# Patient Record
Sex: Male | Born: 1960 | Race: White | Hispanic: No | Marital: Married | Smoking: Never smoker
Health system: Southern US, Community
[De-identification: ages and names within clinical notes are randomized; demographics above are authoritative.]

## PROBLEM LIST (undated history)

## (undated) DIAGNOSIS — L570 Actinic keratosis: Secondary | ICD-10-CM

## (undated) DIAGNOSIS — I2510 Atherosclerotic heart disease of native coronary artery without angina pectoris: Secondary | ICD-10-CM

## (undated) DIAGNOSIS — Z9889 Other specified postprocedural states: Secondary | ICD-10-CM

## (undated) DIAGNOSIS — E785 Hyperlipidemia, unspecified: Secondary | ICD-10-CM

## (undated) DIAGNOSIS — I10 Essential (primary) hypertension: Secondary | ICD-10-CM

## (undated) HISTORY — PX: APPENDECTOMY: SHX54

## (undated) HISTORY — PX: CARDIAC CATHETERIZATION: SHX172

## (undated) HISTORY — DX: Other specified postprocedural states: Z98.890

## (undated) HISTORY — DX: Essential (primary) hypertension: I10

## (undated) HISTORY — PX: HERNIA REPAIR: SHX51

## (undated) HISTORY — PX: FINGER SURGERY: SHX640

## (undated) HISTORY — DX: Hyperlipidemia, unspecified: E78.5

## (undated) HISTORY — PX: CORONARY ARTERY BYPASS GRAFT: SHX141

## (undated) HISTORY — DX: Actinic keratosis: L57.0

---

## 2004-10-24 ENCOUNTER — Ambulatory Visit: Payer: Self-pay | Admitting: Family Medicine

## 2005-08-08 ENCOUNTER — Ambulatory Visit: Payer: Self-pay | Admitting: Family Medicine

## 2010-02-18 ENCOUNTER — Ambulatory Visit: Payer: Self-pay | Admitting: Family Medicine

## 2010-10-25 LAB — HM COLONOSCOPY

## 2012-01-29 LAB — HM HEPATITIS C SCREENING LAB: HM Hepatitis Screen: NEGATIVE

## 2013-12-13 ENCOUNTER — Ambulatory Visit: Payer: Self-pay | Admitting: Family Medicine

## 2014-03-03 ENCOUNTER — Ambulatory Visit: Payer: Self-pay | Admitting: Family Medicine

## 2014-04-04 LAB — CBC AND DIFFERENTIAL
HEMATOCRIT: 41 % (ref 41–53)
HEMOGLOBIN: 14.7 g/dL (ref 13.5–17.5)
Neutrophils Absolute: 4 /uL
Platelets: 189 10*3/uL (ref 150–399)
WBC: 5.9 10^3/mL

## 2014-04-04 LAB — BASIC METABOLIC PANEL
BUN: 12 mg/dL (ref 4–21)
Creatinine: 1 mg/dL (ref 0.6–1.3)
GLUCOSE: 119 mg/dL
POTASSIUM: 4.4 mmol/L (ref 3.4–5.3)
Sodium: 139 mmol/L (ref 137–147)

## 2014-04-04 LAB — LIPID PANEL
CHOLESTEROL: 194 mg/dL (ref 0–200)
HDL: 42 mg/dL (ref 35–70)
LDL CALC: 118 mg/dL
LDl/HDL Ratio: 2.8
Triglycerides: 170 mg/dL — AB (ref 40–160)

## 2014-04-04 LAB — HEPATIC FUNCTION PANEL
ALT: 42 U/L — AB (ref 10–40)
AST: 30 U/L (ref 14–40)
Alkaline Phosphatase: 58 U/L (ref 25–125)

## 2014-04-04 LAB — PSA: PSA: 0.5

## 2014-04-04 LAB — TSH: TSH: 1.55 u[IU]/mL (ref 0.41–5.90)

## 2014-09-06 DIAGNOSIS — D239 Other benign neoplasm of skin, unspecified: Secondary | ICD-10-CM

## 2014-09-06 HISTORY — DX: Other benign neoplasm of skin, unspecified: D23.9

## 2014-10-03 LAB — HEMOGLOBIN A1C: HEMOGLOBIN A1C: 5.2 % (ref 4.0–6.0)

## 2015-03-23 ENCOUNTER — Other Ambulatory Visit: Payer: Self-pay | Admitting: Family Medicine

## 2015-03-23 NOTE — Telephone Encounter (Signed)
Shawn Crawford stated that her insurance notified her that they would no longer cover Zolpidem Tartrate ER 12.5 mg and that Lunesta would be covered. Shawn Crawford would like a RX for Lunesta sent to [facility] [address] Abbott Crawford stated she was on Lunesta before and in [facility] the last time Shawn Crawford was written was for 3 mg on 11/04/12. I advised Shawn Crawford that Shawn Crawford is out of the office until 04/03/15 & I would ask another provider if the new RX could be sent in. Shawn Crawford stated that was fine but since she has enough until mid July she can wait until Shawn Crawford returns to the office. Thanks TNP

## 2015-04-04 MED ORDER — ESZOPICLONE 3 MG PO TABS: 3.0000 mg | ORAL_TABLET | Freq: Every day | ORAL | Status: DC

## 2015-04-04 NOTE — Telephone Encounter (Signed)
Okay to print prescription for Lunesta 3 mg daily at bedtime when necessary sleep #30,5rf refills. Thanks

## 2015-04-04 NOTE — Telephone Encounter (Signed)
Patient advised, RX ready for pick up=aa

## 2015-05-07 DIAGNOSIS — I10 Essential (primary) hypertension: Secondary | ICD-10-CM | POA: Insufficient documentation

## 2015-05-10 DIAGNOSIS — E559 Vitamin D deficiency, unspecified: Secondary | ICD-10-CM | POA: Insufficient documentation

## 2015-05-10 DIAGNOSIS — I10 Essential (primary) hypertension: Secondary | ICD-10-CM | POA: Insufficient documentation

## 2015-05-10 DIAGNOSIS — E669 Obesity, unspecified: Secondary | ICD-10-CM | POA: Insufficient documentation

## 2015-05-10 DIAGNOSIS — G8929 Other chronic pain: Secondary | ICD-10-CM | POA: Insufficient documentation

## 2015-05-10 DIAGNOSIS — I2510 Atherosclerotic heart disease of native coronary artery without angina pectoris: Secondary | ICD-10-CM | POA: Insufficient documentation

## 2015-05-10 DIAGNOSIS — N529 Male erectile dysfunction, unspecified: Secondary | ICD-10-CM | POA: Insufficient documentation

## 2015-05-10 DIAGNOSIS — M5416 Radiculopathy, lumbar region: Secondary | ICD-10-CM | POA: Insufficient documentation

## 2015-05-10 DIAGNOSIS — R7309 Other abnormal glucose: Secondary | ICD-10-CM | POA: Insufficient documentation

## 2015-05-10 DIAGNOSIS — K219 Gastro-esophageal reflux disease without esophagitis: Secondary | ICD-10-CM | POA: Insufficient documentation

## 2015-05-10 DIAGNOSIS — E785 Hyperlipidemia, unspecified: Secondary | ICD-10-CM | POA: Insufficient documentation

## 2015-05-30 ENCOUNTER — Encounter: Payer: Self-pay | Admitting: Family Medicine

## 2015-05-30 ENCOUNTER — Ambulatory Visit (INDEPENDENT_AMBULATORY_CARE_PROVIDER_SITE_OTHER): Payer: 59 | Admitting: Family Medicine

## 2015-05-30 VITALS — BP 118/84 | HR 84 | Temp 98.8°F | Resp 16 | Ht 71.0 in | Wt 183.0 lb

## 2015-05-30 DIAGNOSIS — Z1211 Encounter for screening for malignant neoplasm of colon: Secondary | ICD-10-CM | POA: Diagnosis not present

## 2015-05-30 DIAGNOSIS — Z0000 Encounter for general adult medical examination without abnormal findings: Secondary | ICD-10-CM

## 2015-05-30 DIAGNOSIS — Z125 Encounter for screening for malignant neoplasm of prostate: Secondary | ICD-10-CM | POA: Diagnosis not present

## 2015-05-30 DIAGNOSIS — E119 Type 2 diabetes mellitus without complications: Secondary | ICD-10-CM | POA: Diagnosis not present

## 2015-05-30 DIAGNOSIS — Z23 Encounter for immunization: Secondary | ICD-10-CM

## 2015-05-30 DIAGNOSIS — R739 Hyperglycemia, unspecified: Secondary | ICD-10-CM | POA: Insufficient documentation

## 2015-05-30 LAB — POCT URINALYSIS DIPSTICK
Bilirubin, UA: NEGATIVE
GLUCOSE UA: NEGATIVE
Ketones, UA: NEGATIVE
Leukocytes, UA: NEGATIVE
NITRITE UA: NEGATIVE
PH UA: 6
Protein, UA: NEGATIVE
RBC UA: NEGATIVE
SPEC GRAV UA: 1.025
UROBILINOGEN UA: 0.2

## 2015-05-30 LAB — IFOBT (OCCULT BLOOD): IMMUNOLOGICAL FECAL OCCULT BLOOD TEST: NEGATIVE

## 2015-05-30 NOTE — Progress Notes (Signed)
Patient: Shawn Crawford, Male    DOB: 1961-01-02, 54 y.o.   MRN: [id number] Visit Date: 05/30/2015  Today's Provider: [name], MD   Chief Complaint  Patient presents with  . Annual Exam   Subjective:    Annual physical exam Shawn Crawford is a 54 y.o. male who presents today for health maintenance and complete physical. He feels well. He reports exercising yes/walking 2 miles aily. He reports he is sleeping fairly well/6hrs a night.  -----------------------------------------------------------------   Review of Systems  Constitutional: Negative.   HENT: Negative.   Eyes: Negative.   Respiratory: Negative.   Cardiovascular: Negative.   Gastrointestinal: Negative.   Endocrine: Negative.   Genitourinary: Negative.   Musculoskeletal: Negative.   Skin: Negative.   Allergic/Immunologic: Negative.   Neurological: Positive for dizziness. Negative for light-headedness and headaches.  Hematological: Negative.   Psychiatric/Behavioral: Negative.     Social History He  reports that he has never smoked. He does not have any smokeless tobacco history on file. He reports that he drinks alcohol. He reports that he does not use illicit drugs. Social History   Social History  . Marital Status: Single    Spouse Name: N/A  . Number of Children: N/A  . Years of Education: N/A   Social History Main Topics  . Smoking status: Never Smoker   . Smokeless tobacco: None  . Alcohol Use: 0.0 oz/week    0 Standard drinks or equivalent per week  . Drug Use: No  . Sexual Activity: Not Asked   Other Topics Concern  . None   Social History Narrative    Patient Active Problem List   Diagnosis Date Noted  . Abnormal blood sugar 05/10/2015  . Arteriosclerosis of coronary artery 05/10/2015  . ED (erectile dysfunction) of organic origin 05/10/2015  . Acid reflux 05/10/2015  . HLD (hyperlipidemia) 05/10/2015  . BP (high blood pressure) 05/10/2015  . Lumbar radiculopathy  05/10/2015  . Adiposity 05/10/2015  . Avitaminosis D 05/10/2015    Past Surgical History  Procedure Laterality Date  . Coronary artery bypass graft      x 3  . Hernia repair    . Appendectomy      Family History  Family Status  Relation Status Death Age  . Mother Alive   . Father Deceased   . Sister Alive   . Son Alive   . Sister Alive   . Son Alive    His family history includes Colon cancer in his sister; Diabetes in his father; Hyperlipidemia in his mother; Hypertension in his father; Thyroid cancer in his sister.    Allergies  Allergen Reactions  . Penicillins     Previous Medications   ASPIRIN 325 MG TABLET    Take by mouth.   COENZYME Q-10 PO    Take by mouth.   CYCLOBENZAPRINE (FLEXERIL) 10 MG TABLET    Take by mouth.   ESZOPICLONE (ESZOPICLONE) 3 MG TABS    Take 1 tablet (3 mg total) by mouth at bedtime. Take immediately before bedtime   LORATADINE (CLARITIN) 10 MG CAPS    Take by mouth.   METOPROLOL TARTRATE (LOPRESSOR) 25 MG TABLET    Take by mouth.   MULTIPLE VITAMINS PO    Take by mouth.   OMEGA-3 FATTY ACIDS (FISH OIL) 1200 MG CAPS    Take by mouth.   OMEPRAZOLE (PRILOSEC) 20 MG CAPSULE    Take by mouth.   ROSUVASTATIN (CRESTOR)  20 MG TABLET    Take by mouth.   ZOLPIDEM (AMBIEN CR) 12.5 MG CR TABLET    Take by mouth.    Patient Care Team: Jerrol Banana., MD as PCP - General (Family Medicine)     Objective:   Vitals: BP 118/84 mmHg  Pulse 84  Temp(Src) 98.8 F (37.1 C) (Oral)  Resp 16  Ht 5\' 11"  (1.803 m)  Wt 183 lb (83.008 kg)  BMI 25.53 kg/m2  SpO2 98%   Physical Exam  Constitutional: He is oriented to person, place, and time. He appears well-developed and well-nourished.  HENT:  Head: Normocephalic and atraumatic.  Right Ear: External ear normal.  Nose: Nose normal.  Mouth/Throat: Oropharynx is clear and moist.  Eyes: Conjunctivae are normal. Pupils are equal, round, and reactive to light.  Neck: Normal range of motion. Neck  supple.  Cardiovascular: Normal rate, regular rhythm, normal heart sounds and intact distal pulses.   Pulmonary/Chest: Effort normal and breath sounds normal.  Abdominal: Soft. Bowel sounds are normal.  Genitourinary: Rectum normal, prostate normal and penis normal.  Musculoskeletal: Normal range of motion.  Neurological: He is alert and oriented to person, place, and time.  Skin: Skin is warm and dry.  Male pattern baldness. Scar on epigastrium where Dr. Nehemiah Massed removed  dysplastic nevus Feb  2016  Psychiatric: He has a normal mood and affect. His behavior is normal. Judgment and thought content normal.     Depression Screen PHQ 2/9 Scores 05/30/2015  PHQ - 2 Score 0  PHQ- 9 Score 0      Assessment & Plan:     Routine Health Maintenance and Physical Exam  Exercise Activities and Dietary recommendations Goals    None      Immunization History  Administered Date(s) Administered  . Tdap 12/04/2008    Health Maintenance  Topic Date Due  . Hepatitis C Screening  11-Jan-1961  . HIV Screening  05/18/1975  . INFLUENZA VACCINE  04/30/2015  . TETANUS/TDAP  12/05/2018  . COLONOSCOPY  10/25/2020      Discussed health benefits of physical activity, and encouraged him to engage in regular exercise appropriate for his age and condition.    --------------------------------------------------------------------

## 2015-06-12 LAB — LIPID PANEL WITH LDL/HDL RATIO
Cholesterol, Total: 158 mg/dL (ref 100–199)
HDL: 46 mg/dL (ref >39)
LDL CALC: 90 mg/dL (ref 0–99)
LDL/HDL RATIO: 2 ratio (ref 0.0–3.6)
TRIGLYCERIDES: 110 mg/dL (ref 0–149)
VLDL CHOLESTEROL CAL: 22 mg/dL (ref 5–40)

## 2015-06-12 LAB — COMPREHENSIVE METABOLIC PANEL
ALK PHOS: 54 IU/L (ref 39–117)
ALT: 40 IU/L (ref 0–44)
AST: 27 IU/L (ref 0–40)
Albumin/Globulin Ratio: 1.9 (ref 1.1–2.5)
Albumin: 4.4 g/dL (ref 3.5–5.5)
BILIRUBIN TOTAL: 0.3 mg/dL (ref 0.0–1.2)
BUN / CREAT RATIO: 16 (ref 9–20)
BUN: 19 mg/dL (ref 6–24)
CHLORIDE: 103 mmol/L (ref 97–108)
CO2: 22 mmol/L (ref 18–29)
Calcium: 9.7 mg/dL (ref 8.7–10.2)
Creatinine, Ser: 1.16 mg/dL (ref 0.76–1.27)
GFR calc Af Amer: 81 mL/min/{1.73_m2} (ref >59)
GFR calc non Af Amer: 70 mL/min/{1.73_m2} (ref >59)
GLUCOSE: 131 mg/dL — AB (ref 65–99)
Globulin, Total: 2.3 g/dL (ref 1.5–4.5)
POTASSIUM: 4.6 mmol/L (ref 3.5–5.2)
Sodium: 143 mmol/L (ref 134–144)
Total Protein: 6.7 g/dL (ref 6.0–8.5)

## 2015-06-12 LAB — CBC WITH DIFFERENTIAL/PLATELET
BASOS ABS: 0 10*3/uL (ref 0.0–0.2)
Basos: 0 %
EOS (ABSOLUTE): 0.2 10*3/uL (ref 0.0–0.4)
Eos: 3 %
Hematocrit: 45.2 % (ref 37.5–51.0)
Hemoglobin: 15.8 g/dL (ref 12.6–17.7)
Immature Grans (Abs): 0 10*3/uL (ref 0.0–0.1)
Immature Granulocytes: 0 %
LYMPHS ABS: 1.1 10*3/uL (ref 0.7–3.1)
LYMPHS: 20 %
MCH: 31.3 pg (ref 26.6–33.0)
MCHC: 35 g/dL (ref 31.5–35.7)
MCV: 90 fL (ref 79–97)
MONOS ABS: 0.5 10*3/uL (ref 0.1–0.9)
Monocytes: 9 %
NEUTROS ABS: 3.7 10*3/uL (ref 1.4–7.0)
Neutrophils: 68 %
PLATELETS: 170 10*3/uL (ref 150–379)
RBC: 5.05 x10E6/uL (ref 4.14–5.80)
RDW: 12.4 % (ref 12.3–15.4)
WBC: 5.5 10*3/uL (ref 3.4–10.8)

## 2015-06-12 LAB — HEMOGLOBIN A1C
Est. average glucose Bld gHb Est-mCnc: 123 mg/dL
Hgb A1c MFr Bld: 5.9 % — ABNORMAL HIGH (ref 4.8–5.6)

## 2015-06-12 LAB — TSH: TSH: 0.952 u[IU]/mL (ref 0.450–4.500)

## 2015-06-12 LAB — PSA: PROSTATE SPECIFIC AG, SERUM: 0.6 ng/mL (ref 0.0–4.0)

## 2015-07-31 ENCOUNTER — Ambulatory Visit: Payer: Self-pay | Admitting: Family Medicine

## 2015-08-21 ENCOUNTER — Encounter: Payer: Self-pay | Admitting: Family Medicine

## 2015-08-21 ENCOUNTER — Ambulatory Visit (INDEPENDENT_AMBULATORY_CARE_PROVIDER_SITE_OTHER): Payer: 59 | Admitting: Family Medicine

## 2015-08-21 VITALS — BP 118/76 | HR 60 | Temp 97.9°F | Resp 12 | Wt 189.0 lb

## 2015-08-21 DIAGNOSIS — G4700 Insomnia, unspecified: Secondary | ICD-10-CM

## 2015-08-21 DIAGNOSIS — E785 Hyperlipidemia, unspecified: Secondary | ICD-10-CM | POA: Diagnosis not present

## 2015-08-21 DIAGNOSIS — Z23 Encounter for immunization: Secondary | ICD-10-CM | POA: Diagnosis not present

## 2015-08-21 DIAGNOSIS — M5416 Radiculopathy, lumbar region: Secondary | ICD-10-CM

## 2015-08-21 DIAGNOSIS — R7309 Other abnormal glucose: Secondary | ICD-10-CM | POA: Diagnosis not present

## 2015-08-21 DIAGNOSIS — I2510 Atherosclerotic heart disease of native coronary artery without angina pectoris: Secondary | ICD-10-CM | POA: Diagnosis not present

## 2015-08-21 DIAGNOSIS — I10 Essential (primary) hypertension: Secondary | ICD-10-CM

## 2015-08-21 NOTE — Progress Notes (Signed)
Patient ID: Shawn Crawford, male   DOB: [birth year], 54 y.o.   MRN: [id number]    Subjective:  HPI  Hypertension follow up: Patient was last seen in August for CPE. Patient checks his B/P and readings are around 120/72. No cardiac symptoms.. BP Readings from Last 3 Encounters:  08/21/15 118/76  05/30/15 118/84  01/30/15 118/78   Insomnia follow up: Patient takes Ambien almost nightly, on the weekend sometimes he will skip.  Back pain: Patient was moving a couch about 3 weeks ago and felt a catch in his back. He has been using Ibuprofen and it has helped some but the discomfort is still there. Prior to Admission medications   Medication Sig Start Date End Date Taking? Authorizing Provider  aspirin 325 MG tablet Take by mouth. 10/22/11  Yes Historical Provider, MD  COENZYME Q-10 PO Take by mouth. 07/31/11  Yes Historical Provider, MD  cyclobenzaprine (FLEXERIL) 10 MG tablet Take by mouth. 02/21/14  Yes Historical Provider, MD  Eszopiclone (ESZOPICLONE) 3 MG TABS Take 1 tablet (3 mg total) by mouth at bedtime. Take immediately before bedtime 04/04/15  Yes Glenda Kunst Maceo Pro., MD  Loratadine (CLARITIN) 10 MG CAPS Take by mouth.   Yes Historical Provider, MD  metoprolol tartrate (LOPRESSOR) 25 MG tablet Take by mouth.   Yes Historical Provider, MD  MULTIPLE VITAMINS PO Take by mouth. 10/22/11  Yes Historical Provider, MD  Omega-3 Fatty Acids (FISH OIL) 1200 MG CAPS Take by mouth. 10/22/11  Yes Historical Provider, MD  omeprazole (PRILOSEC) 20 MG capsule Take by mouth. 12/13/13  Yes Historical Provider, MD  rosuvastatin (CRESTOR) 20 MG tablet Take by mouth.   Yes Historical Provider, MD  zolpidem (AMBIEN CR) 12.5 MG CR tablet Take by mouth. 01/07/15  Yes Historical Provider, MD    Patient Active Problem List   Diagnosis Date Noted  . Diabetes mellitus, type 2 ([place]) 05/30/2015  . Abnormal blood sugar 05/10/2015  . Arteriosclerosis of coronary artery 05/10/2015  . ED (erectile dysfunction) of  organic origin 05/10/2015  . Acid reflux 05/10/2015  . HLD (hyperlipidemia) 05/10/2015  . BP (high blood pressure) 05/10/2015  . Lumbar radiculopathy 05/10/2015  . Adiposity 05/10/2015  . Avitaminosis D 05/10/2015  . Benign essential HTN 05/07/2015    No past medical history on file.  Social History   Social History  . Marital Status: Single    Spouse Name: N/A  . Number of Children: N/A  . Years of Education: N/A   Occupational History  . Not on file.   Social History Main Topics  . Smoking status: Never Smoker   . Smokeless tobacco: Never Used  . Alcohol Use: 0.0 oz/week    0 Standard drinks or equivalent per week  . Drug Use: No  . Sexual Activity: Not on file   Other Topics Concern  . Not on file   Social History Narrative    Allergies  Allergen Reactions  . Penicillins     Review of Systems  Constitutional: Negative.   HENT: Negative.   Eyes: Negative.   Respiratory: Negative.   Cardiovascular: Negative.   Gastrointestinal: Negative.   Musculoskeletal: Positive for back pain.  Skin: Negative.   Neurological: Negative.   Endo/Heme/Allergies: Negative.   Psychiatric/Behavioral: Negative.     Immunization History  Administered Date(s) Administered  . Influenza,inj,Quad PF,36+ Mos 05/30/2015  . Tdap 12/04/2008   Objective:  BP 118/76 mmHg  Pulse 60  Temp(Src) 97.9 F (36.6 C)  Resp 12  Wt  189 lb (85.73 kg)  Physical Exam  Constitutional: He is oriented to person, place, and time and well-developed, well-nourished, and in no distress.  HENT:  Head: Normocephalic and atraumatic.  Eyes: Conjunctivae are normal. Pupils are equal, round, and reactive to light.  Neck: Normal range of motion. Neck supple.  Cardiovascular: Normal rate, normal heart sounds and intact distal pulses.   No murmur heard. Pulmonary/Chest: Effort normal and breath sounds normal. No respiratory distress. He has no wheezes.  Musculoskeletal: He exhibits no edema.  Tender  over SI joint  Neurological: He is alert and oriented to person, place, and time.  Psychiatric: Mood, memory, affect and judgment normal.    Lab Results  Component Value Date   WBC 5.5 06/11/2015   HGB 14.7 04/04/2014   HCT 45.2 06/11/2015   PLT 189 04/04/2014   GLUCOSE 131* 06/11/2015   CHOL 158 06/11/2015   TRIG 110 06/11/2015   HDL 46 06/11/2015   LDLCALC 90 06/11/2015   TSH 0.952 06/11/2015   PSA 0.6 06/11/2015   HGBA1C 5.9* 06/11/2015    CMP     Component Value Date/Time   NA 143 06/11/2015 0857   K 4.6 06/11/2015 0857   CL 103 06/11/2015 0857   CO2 22 06/11/2015 0857   GLUCOSE 131* 06/11/2015 0857   BUN 19 06/11/2015 0857   CREATININE 1.16 06/11/2015 0857   CREATININE 1.0 04/04/2014   CALCIUM 9.7 06/11/2015 0857   PROT 6.7 06/11/2015 0857   ALBUMIN 4.4 06/11/2015 0857   AST 27 06/11/2015 0857   ALT 40 06/11/2015 0857   ALKPHOS 54 06/11/2015 0857   BILITOT 0.3 06/11/2015 0857   GFRNONAA 70 06/11/2015 0857   GFRAA 81 06/11/2015 0857    Assessment and Plan :  1. Benign essential HTN Stable. Continue current medication  2. Abnormal blood sugar Too early to check A1C today will follow up on the next visit.  3. HLD (hyperlipidemia)  4. Lumbar radiculopathy Stable. Has acute back strain currently continue using Ibuprofen as needed, heat and follow as needed.  5. Insomnia Discussed cutting back on Ambien, discussed possible cause of memory issues with the use of this medication.          Patient was seen and examined by Dr. Eulas Post and note was scribed by Theressa Millard, RMA.   Miguel Aschoff MD [facility] 08/21/2015 11:13 AM

## 2015-09-28 ENCOUNTER — Other Ambulatory Visit: Payer: Self-pay

## 2015-09-28 MED ORDER — ESZOPICLONE 3 MG PO TABS: 3.0000 mg | ORAL_TABLET | Freq: Every day | ORAL | Status: DC

## 2015-09-28 NOTE — Telephone Encounter (Signed)
RX refill request for Lunesta 3 mgfrom patient, he is switching this medication to [facility]. Last fill was in July 2016 with 5 refills for local pharmacy.  LOV 08/21/15. Dr Alben Spittle patient. Thank you-aa

## 2015-09-28 NOTE — Telephone Encounter (Signed)
Printed, please fax or call in to pharmacy. Thank you.   

## 2015-11-29 ENCOUNTER — Ambulatory Visit: Payer: 59 | Admitting: Family Medicine

## 2015-12-12 ENCOUNTER — Ambulatory Visit (INDEPENDENT_AMBULATORY_CARE_PROVIDER_SITE_OTHER): Payer: 59 | Admitting: Family Medicine

## 2015-12-12 ENCOUNTER — Encounter: Payer: Self-pay | Admitting: Family Medicine

## 2015-12-12 VITALS — BP 122/78 | HR 80 | Temp 98.0°F | Resp 18 | Wt 194.0 lb

## 2015-12-12 DIAGNOSIS — I10 Essential (primary) hypertension: Secondary | ICD-10-CM | POA: Diagnosis not present

## 2015-12-12 DIAGNOSIS — K219 Gastro-esophageal reflux disease without esophagitis: Secondary | ICD-10-CM | POA: Diagnosis not present

## 2015-12-12 DIAGNOSIS — E785 Hyperlipidemia, unspecified: Secondary | ICD-10-CM

## 2015-12-12 DIAGNOSIS — E119 Type 2 diabetes mellitus without complications: Secondary | ICD-10-CM | POA: Diagnosis not present

## 2015-12-12 MED ORDER — ROSUVASTATIN CALCIUM 40 MG PO TABS: 40.0000 mg | ORAL_TABLET | Freq: Every day | ORAL | Status: DC

## 2015-12-12 NOTE — Progress Notes (Signed)
Patient ID: Shawn Crawford, male   DOB: 02-14-1961, 55 y.o.   MRN: [id number]    Subjective:  HPI  Diabetes Mellitus Type II, Follow-up:   Lab Results  Component Value Date   HGBA1C 5.9* 06/11/2015   HGBA1C 5.2 10/03/2014   Last seen for diabetes 6 months ago.  Management since then includes none. He reports good compliance with treatment. He is not having side effects.  Current symptoms include none Home blood sugar records: Controlled.   Episodes of hypoglycemia? no   Current exercise: walking,  2 miles three times a week.   ------------------------------------------------------------------------   Hypertension, follow-up:  BP Readings from Last 3 Encounters:  12/12/15 122/78  08/21/15 118/76  05/30/15 118/84    He was last seen for hypertension 6 months ago.  BP at that visit was 118/76. Management since that visit includes none .He reports good compliance with treatment. He is not having side effects.  He is exercising. He is adherent to low salt diet.   Outside blood pressures are 110-120's/80's. He is experiencing none.  Patient denies chest pain, chest pressure/discomfort, claudication, dyspnea, exertional chest pressure/discomfort, fatigue, irregular heart beat, lower extremity edema and palpitations.   ------------------------------------------------------------------------    Lipid/Cholesterol, Follow-up:   Last seen for this 6 months ago.  Management since that visit includes none.  Last Lipid Panel:    Component Value Date/Time   CHOL 158 06/11/2015 0857   CHOL 194 04/04/2014   TRIG 110 06/11/2015 0857   HDL 46 06/11/2015 0857   HDL 42 04/04/2014   LDLCALC 90 06/11/2015 0857   LDLCALC 118 04/04/2014    He reports good compliance with treatment. He is not having side effects.   Wt Readings from Last 3 Encounters:  12/12/15 194 lb (87.998 kg)  08/21/15 189 lb (85.73 kg)  05/30/15 183 lb (83.008 kg)     ------------------------------------------------------------------------     Prior to Admission medications   Medication Sig Start Date End Date Taking? Authorizing Provider  aspirin 325 MG tablet Take by mouth. 10/22/11  Yes Historical Provider, MD  COENZYME Q-10 PO Take by mouth. 07/31/11  Yes Historical Provider, MD  cyclobenzaprine (FLEXERIL) 10 MG tablet Take by mouth. 02/21/14  Yes Historical Provider, MD  Eszopiclone (ESZOPICLONE) 3 MG TABS Take 1 tablet (3 mg total) by mouth at bedtime. Take immediately before bedtime 09/28/15  Yes Margarita Rana, MD  Loratadine (CLARITIN) 10 MG CAPS Take by mouth.   Yes Historical Provider, MD  metoprolol tartrate (LOPRESSOR) 25 MG tablet Take by mouth.   Yes Historical Provider, MD  MULTIPLE VITAMINS PO Take by mouth. 10/22/11  Yes Historical Provider, MD  Omega-3 Fatty Acids (FISH OIL) 1200 MG CAPS Take by mouth. 10/22/11  Yes Historical Provider, MD  omeprazole (PRILOSEC) 20 MG capsule Take by mouth. 12/13/13  Yes Historical Provider, MD  rosuvastatin (CRESTOR) 20 MG tablet Take by mouth.   Yes Historical Provider, MD  zolpidem (AMBIEN CR) 12.5 MG CR tablet Take by mouth. 01/07/15  Yes Historical Provider, MD    Patient Active Problem List   Diagnosis Date Noted  . Diabetes mellitus, type 2 ([place]) 05/30/2015  . Abnormal blood sugar 05/10/2015  . Arteriosclerosis of coronary artery 05/10/2015  . ED (erectile dysfunction) of organic origin 05/10/2015  . Acid reflux 05/10/2015  . HLD (hyperlipidemia) 05/10/2015  . BP (high blood pressure) 05/10/2015  . Lumbar radiculopathy 05/10/2015  . Adiposity 05/10/2015  . Avitaminosis D 05/10/2015  . Benign essential HTN 05/07/2015  History reviewed. No pertinent past medical history.  Social History   Social History  . Marital Status: Single    Spouse Name: N/A  . Number of Children: N/A  . Years of Education: N/A   Occupational History  . Not on file.   Social History Main Topics  .  Smoking status: Never Smoker   . Smokeless tobacco: Never Used  . Alcohol Use: 0.0 oz/week    0 Standard drinks or equivalent per week     Comment: glass os wine twice a week  . Drug Use: No  . Sexual Activity: Not on file   Other Topics Concern  . Not on file   Social History Narrative    Allergies  Allergen Reactions  . Penicillins     Review of Systems  Constitutional: Negative.   HENT: Negative.   Eyes: Negative.   Respiratory: Negative.   Cardiovascular: Negative.   Gastrointestinal: Negative.   Genitourinary: Negative.   Musculoskeletal: Negative.   Skin: Negative.   Neurological: Negative.   Endo/Heme/Allergies: Negative.   Psychiatric/Behavioral: Negative.     Immunization History  Administered Date(s) Administered  . Influenza,inj,Quad PF,36+ Mos 05/30/2015  . Pneumococcal Conjugate-13 08/21/2015  . Tdap 12/04/2008   Objective:  BP 122/78 mmHg  Pulse 80  Temp(Src) 98 F (36.7 C) (Oral)  Resp 18  Wt 194 lb (87.998 kg)  Physical Exam  Constitutional: He is oriented to person, place, and time and well-developed, well-nourished, and in no distress.  HENT:  Head: Normocephalic and atraumatic.  Right Ear: External ear normal.  Left Ear: External ear normal.  Nose: Nose normal.  Mouth/Throat: Oropharynx is clear and moist.  Eyes: Conjunctivae are normal.  Neck: Neck supple. No thyromegaly present.  Cardiovascular: Normal rate, regular rhythm, normal heart sounds and intact distal pulses.   Pulmonary/Chest: Effort normal and breath sounds normal.  Abdominal: Soft.  Lymphadenopathy:    He has no cervical adenopathy.  Neurological: He is alert and oriented to person, place, and time.  Skin: Skin is warm and dry.  Psychiatric: Mood, memory, affect and judgment normal.    Lab Results  Component Value Date   WBC 5.5 06/11/2015   HGB 14.7 04/04/2014   HCT 45.2 06/11/2015   PLT 170 06/11/2015   GLUCOSE 131* 06/11/2015   CHOL 158 06/11/2015   TRIG  110 06/11/2015   HDL 46 06/11/2015   LDLCALC 90 06/11/2015   TSH 0.952 06/11/2015   PSA 0.5 04/04/2014   HGBA1C 5.9* 06/11/2015    CMP     Component Value Date/Time   NA 143 06/11/2015 0857   K 4.6 06/11/2015 0857   CL 103 06/11/2015 0857   CO2 22 06/11/2015 0857   GLUCOSE 131* 06/11/2015 0857   BUN 19 06/11/2015 0857   CREATININE 1.16 06/11/2015 0857   CREATININE 1.0 04/04/2014   CALCIUM 9.7 06/11/2015 0857   PROT 6.7 06/11/2015 0857   ALBUMIN 4.4 06/11/2015 0857   AST 27 06/11/2015 0857   ALT 40 06/11/2015 0857   ALKPHOS 54 06/11/2015 0857   BILITOT 0.3 06/11/2015 0857   GFRNONAA 70 06/11/2015 0857   GFRAA 81 06/11/2015 0857    Assessment and Plan :  1. Type 2 diabetes mellitus without complication, without long-term current use of insulin (HCC)  - POCT HgB A1C--5.7 today  2. HLD (hyperlipidemia)  - Lipid panel  3. Benign essential HTN   4. Gastroesophageal reflux disease, esophagitis presence not specified   5. CAD/status post CABG 2004 All  risk factors treated aggressively. Exercise encouraged. I have done the exam and reviewed the above chart and it is accurate to the best of my knowledge.    Miguel Aschoff MD [facility] 12/12/2015 1:44 PM

## 2015-12-12 NOTE — Patient Instructions (Addendum)
Increase rosuvastatin to 40 mg daily

## 2015-12-18 ENCOUNTER — Telehealth: Payer: Self-pay

## 2015-12-18 NOTE — Telephone Encounter (Signed)
May rf for 6 months

## 2015-12-18 NOTE — Telephone Encounter (Signed)
Fax from [facility] for refill request for Lunesta 3 mg-aa

## 2015-12-19 MED ORDER — ESZOPICLONE 3 MG PO TABS: 3.0000 mg | ORAL_TABLET | Freq: Every day | ORAL | Status: DC

## 2015-12-19 NOTE — Telephone Encounter (Signed)
Done-aa 

## 2015-12-20 ENCOUNTER — Other Ambulatory Visit: Payer: Self-pay

## 2015-12-20 DIAGNOSIS — E785 Hyperlipidemia, unspecified: Secondary | ICD-10-CM

## 2015-12-20 MED ORDER — ROSUVASTATIN CALCIUM 40 MG PO TABS: 40.0000 mg | ORAL_TABLET | Freq: Every day | ORAL | Status: DC

## 2016-02-18 ENCOUNTER — Ambulatory Visit: Payer: 59 | Admitting: Family Medicine

## 2016-06-03 ENCOUNTER — Encounter: Payer: Self-pay | Admitting: Family Medicine

## 2016-06-03 ENCOUNTER — Telehealth: Payer: Self-pay

## 2016-06-03 NOTE — Telephone Encounter (Signed)
Can patient switch back to generic Ambien CR from lunesta? He is  not sleeping well and having atypical dreams. I had some generic ambien cr from a past prescription and it works well. My insurance will not cover but I will pay pharmacy price. (this was sent through [employer])

## 2016-06-05 MED ORDER — ZOLPIDEM TARTRATE ER 12.5 MG PO TBCR: 12.5000 mg | EXTENDED_RELEASE_TABLET | Freq: Every evening | ORAL | 0 refills | Status: DC | PRN

## 2016-06-05 NOTE — Telephone Encounter (Signed)
Okay to write zolpidem for 30 days .

## 2016-06-05 NOTE — Telephone Encounter (Signed)
Patient advised through [facility]

## 2016-06-16 ENCOUNTER — Ambulatory Visit (INDEPENDENT_AMBULATORY_CARE_PROVIDER_SITE_OTHER): Payer: 59 | Admitting: Family Medicine

## 2016-06-16 VITALS — BP 126/72 | HR 64 | Temp 97.8°F | Wt 187.0 lb

## 2016-06-16 DIAGNOSIS — Z125 Encounter for screening for malignant neoplasm of prostate: Secondary | ICD-10-CM

## 2016-06-16 DIAGNOSIS — G4700 Insomnia, unspecified: Secondary | ICD-10-CM

## 2016-06-16 DIAGNOSIS — I10 Essential (primary) hypertension: Secondary | ICD-10-CM | POA: Diagnosis not present

## 2016-06-16 DIAGNOSIS — M533 Sacrococcygeal disorders, not elsewhere classified: Secondary | ICD-10-CM

## 2016-06-16 DIAGNOSIS — Z23 Encounter for immunization: Secondary | ICD-10-CM

## 2016-06-16 DIAGNOSIS — E119 Type 2 diabetes mellitus without complications: Secondary | ICD-10-CM

## 2016-06-16 DIAGNOSIS — E785 Hyperlipidemia, unspecified: Secondary | ICD-10-CM | POA: Diagnosis not present

## 2016-06-16 DIAGNOSIS — M4328 Fusion of spine, sacral and sacrococcygeal region: Secondary | ICD-10-CM

## 2016-06-16 NOTE — Progress Notes (Signed)
Shawn Crawford  MRN: [id number] DOB: 11/26/1960  Subjective:  HPI  The patient is a 55 year old male who presents for follow up.  His last visit was on 12/12/15.  At that time his A1C was 5.7.  He has not been checking his glucose regularly but about once per month he does and states they have been good.  He was recently seen by Dr Nehemiah Massed and states that he was given a good report on that visit and will not need to seen him for 1 year. The patient had called the office at the beginning of the month requesting that we switch his Lunesta to Ambien.  This was done however, the patient states the Ambien is not helping his sleep habits.  He is still having trouble falling asleep, he does not stay asleep and when he wakes he does not feel rested.  He has been checked for sleep apnea in the past and was told that it was not an issue.   The patient is due for his annual exam this time of the year and it has been a year since his labs were done.   Patient Active Problem List   Diagnosis Date Noted  . Diabetes mellitus, type 2 ([place]) 05/30/2015  . Abnormal blood sugar 05/10/2015  . Arteriosclerosis of coronary artery 05/10/2015  . ED (erectile dysfunction) of organic origin 05/10/2015  . Acid reflux 05/10/2015  . HLD (hyperlipidemia) 05/10/2015  . BP (high blood pressure) 05/10/2015  . Lumbar radiculopathy 05/10/2015  . Adiposity 05/10/2015  . Avitaminosis D 05/10/2015  . Benign essential HTN 05/07/2015    No past medical history on file.  Social History   Social History  . Marital status: Single    Spouse name: N/A  . Number of children: N/A  . Years of education: N/A   Occupational History  . Not on file.   Social History Main Topics  . Smoking status: Never Smoker  . Smokeless tobacco: Never Used  . Alcohol use 0.0 oz/week     Comment: glass os wine twice a week  . Drug use: No  . Sexual activity: Not on file   Other Topics Concern  . Not on file   Social History  Narrative  . No narrative on file    Outpatient Encounter Prescriptions as of 06/16/2016  Medication Sig Note  . aspirin 325 MG tablet Take by mouth. 05/10/2015: Received from: [facility]  . COENZYME Q-10 PO Take by mouth. 05/10/2015: Received from: [facility]  . cyclobenzaprine (FLEXERIL) 10 MG tablet Take by mouth. 05/10/2015: Medication taken as needed.  Received from: [facility]  . Loratadine (CLARITIN) 10 MG CAPS Take by mouth. 05/10/2015: Received from: [facility]  . metoprolol tartrate (LOPRESSOR) 25 MG tablet Take by mouth 2 (two) times daily.  05/10/2015: Received from: [facility]  . MULTIPLE VITAMINS PO Take by mouth. 05/10/2015: Received from: [facility]  . Omega-3 Fatty Acids (FISH OIL) 1200 MG CAPS Take by mouth. 05/10/2015: Received from: [facility]  . omeprazole (PRILOSEC) 20 MG capsule Take by mouth. 05/10/2015: Received from: [facility]  . rosuvastatin (CRESTOR) 40 MG tablet Take 1 tablet (40 mg total) by mouth daily.   Marland Kitchen zolpidem (AMBIEN CR) 12.5 MG CR tablet Take 1 tablet (12.5 mg total) by mouth at bedtime as needed for sleep.    No facility-administered encounter medications on file as of 06/16/2016.     Allergies  Allergen Reactions  . Penicillins     Review of Systems  Constitutional: Positive for malaise/fatigue. Negative for chills and fever.  Respiratory: Negative for cough, hemoptysis, sputum production, shortness of breath and wheezing.   Cardiovascular: Negative for chest pain, palpitations, orthopnea, claudication, leg swelling and PND.  Musculoskeletal: Positive for back pain. Negative for falls, joint pain, myalgias and neck pain.  Neurological: Negative for dizziness, weakness and headaches.  Psychiatric/Behavioral: Negative for depression and memory loss. The patient has insomnia. The patient is not  nervous/anxious.    Objective:  BP 126/72   Pulse 64   Temp 97.8 F (36.6 C) (Oral)   Wt 187 lb (84.8 kg)   BMI 26.08 kg/m   Physical Exam  Constitutional: He is oriented to person, place, and time and well-developed, well-nourished, and in no distress.  HENT:  Head: Normocephalic and atraumatic.  Right Ear: External ear normal.  Left Ear: External ear normal.  Nose: Nose normal.  Eyes: Conjunctivae are normal. Pupils are equal, round, and reactive to light.  Neck: Normal range of motion.  Cardiovascular: Normal rate, regular rhythm and normal heart sounds.   Pulmonary/Chest: Effort normal and breath sounds normal.  Musculoskeletal: He exhibits no edema.  Neurological: He is alert and oriented to person, place, and time. No cranial nerve deficit. He exhibits normal muscle tone. Gait normal. Coordination normal.  Skin: Skin is warm and dry.  Psychiatric: Mood, memory, affect and judgment normal.    Assessment and Plan :   1. Benign essential HTN Continue current medications, obtain labs today.  - CBC with Differential/Platelet - Comprehensive metabolic panel - TSH  2. Type 2 diabetes mellitus without complication, without long-term current use of insulin (HCC) Stable with last A1C, will obtain labs today. - Hemoglobin A1c  3. HLD (hyperlipidemia) Continue with good eating and exercise habits, will obtain labs today. - Lipid Panel With LDL/HDL Ratio  4. Prostate cancer screening  - PSA  5. Flu vaccine need Patient given flu vaccine today, he is up to date on other vaccines at this time.  - Flu Vaccine QUAD 36+ mos PF IM (Fluarix & Fluzone Quad PF)  6. Sacroiliac joint disease The patient mentioned the possibility of surgery, I have advised that he try physical therapy first and then he may get some relief with acupuncture as this would be much less invasive than the surgery. Will refer patient back to physical therapy as this did help in the past.  The patient  will consider acupuncture if PT does not help.  - Ambulatory referral to Physical Therapy  7. Insomnia Patient had side effects with Lunesta (vivid dreams).  Ambien not helping as well as patient would like.  He seems to be bothered by his back issues and this interferes with his sleep.  Continue medications and  Will assess to see if improved after PT.    HPI, Exam and A&P Transcribed under the direction and in the presence of Miguel Aschoff, Brooke Bonito., MD. Electronically Signed: Althea Charon, RMA I have done the exam and reviewed the above chart and it is accurate to the best of my knowledge.

## 2016-06-17 LAB — CBC WITH DIFFERENTIAL/PLATELET
BASOS ABS: 0 10*3/uL (ref 0.0–0.2)
Basos: 1 %
EOS (ABSOLUTE): 0.1 10*3/uL (ref 0.0–0.4)
EOS: 2 %
HEMATOCRIT: 44.9 % (ref 37.5–51.0)
Hemoglobin: 15.8 g/dL (ref 12.6–17.7)
IMMATURE GRANULOCYTES: 0 %
Immature Grans (Abs): 0 10*3/uL (ref 0.0–0.1)
LYMPHS ABS: 1 10*3/uL (ref 0.7–3.1)
Lymphs: 16 %
MCH: 30.8 pg (ref 26.6–33.0)
MCHC: 35.2 g/dL (ref 31.5–35.7)
MCV: 88 fL (ref 79–97)
MONOS ABS: 0.5 10*3/uL (ref 0.1–0.9)
Monocytes: 8 %
NEUTROS PCT: 73 %
Neutrophils Absolute: 4.6 10*3/uL (ref 1.4–7.0)
PLATELETS: 191 10*3/uL (ref 150–379)
RBC: 5.13 x10E6/uL (ref 4.14–5.80)
RDW: 12.6 % (ref 12.3–15.4)
WBC: 6.2 10*3/uL (ref 3.4–10.8)

## 2016-06-17 LAB — COMPREHENSIVE METABOLIC PANEL
ALK PHOS: 56 IU/L (ref 39–117)
ALT: 52 IU/L — AB (ref 0–44)
AST: 36 IU/L (ref 0–40)
Albumin/Globulin Ratio: 2.1 (ref 1.2–2.2)
Albumin: 4.9 g/dL (ref 3.5–5.5)
BUN/Creatinine Ratio: 11 (ref 9–20)
BUN: 12 mg/dL (ref 6–24)
Bilirubin Total: 0.3 mg/dL (ref 0.0–1.2)
CALCIUM: 10 mg/dL (ref 8.7–10.2)
CO2: 20 mmol/L (ref 18–29)
CREATININE: 1.1 mg/dL (ref 0.76–1.27)
Chloride: 102 mmol/L (ref 96–106)
GFR calc Af Amer: 86 mL/min/{1.73_m2} (ref >59)
GFR, EST NON AFRICAN AMERICAN: 75 mL/min/{1.73_m2} (ref >59)
GLOBULIN, TOTAL: 2.3 g/dL (ref 1.5–4.5)
GLUCOSE: 122 mg/dL — AB (ref 65–99)
Potassium: 4.9 mmol/L (ref 3.5–5.2)
SODIUM: 140 mmol/L (ref 134–144)
Total Protein: 7.2 g/dL (ref 6.0–8.5)

## 2016-06-17 LAB — LIPID PANEL WITH LDL/HDL RATIO
Cholesterol, Total: 164 mg/dL (ref 100–199)
HDL: 44 mg/dL (ref >39)
LDL CALC: 100 mg/dL — AB (ref 0–99)
LDL/HDL RATIO: 2.3 ratio (ref 0.0–3.6)
Triglycerides: 99 mg/dL (ref 0–149)
VLDL Cholesterol Cal: 20 mg/dL (ref 5–40)

## 2016-06-17 LAB — HEMOGLOBIN A1C
Est. average glucose Bld gHb Est-mCnc: 123 mg/dL
HEMOGLOBIN A1C: 5.9 % — AB (ref 4.8–5.6)

## 2016-06-17 LAB — TSH: TSH: 1.08 u[IU]/mL (ref 0.450–4.500)

## 2016-06-17 LAB — PSA: Prostate Specific Ag, Serum: 0.5 ng/mL (ref 0.0–4.0)

## 2016-06-18 ENCOUNTER — Telehealth: Payer: Self-pay

## 2016-06-18 NOTE — Telephone Encounter (Signed)
Advised pt of lab results. Pt verbally acknowledges understanding. Emily Drozdowski, CMA   

## 2016-06-18 NOTE — Telephone Encounter (Signed)
-----   Message from Jerrol Banana., MD sent at 06/18/2016 10:41 AM EDT ----- Labs okay. Prediabetes stable

## 2016-08-19 ENCOUNTER — Encounter: Payer: Self-pay | Admitting: Family Medicine

## 2016-08-19 ENCOUNTER — Telehealth: Payer: Self-pay

## 2016-08-19 NOTE — Telephone Encounter (Signed)
Question from patient through mychart: Can I change from generic Ambien CR to the generic Ambien 10mg ? This is due to cost difference. I use mail deliver [employer]. Thanks.

## 2016-08-26 MED ORDER — ZOLPIDEM TARTRATE 10 MG PO TABS: 10.0000 mg | ORAL_TABLET | Freq: Every evening | ORAL | 5 refills | Status: DC | PRN

## 2016-08-26 NOTE — Telephone Encounter (Signed)
Done-aa 

## 2016-08-26 NOTE — Telephone Encounter (Signed)
Ok--5 rf. 

## 2016-10-21 ENCOUNTER — Other Ambulatory Visit: Payer: Self-pay | Admitting: Family Medicine

## 2016-10-21 DIAGNOSIS — E785 Hyperlipidemia, unspecified: Secondary | ICD-10-CM

## 2016-11-06 DIAGNOSIS — M461 Sacroiliitis, not elsewhere classified: Secondary | ICD-10-CM | POA: Diagnosis not present

## 2016-11-06 DIAGNOSIS — M545 Low back pain: Secondary | ICD-10-CM | POA: Diagnosis not present

## 2016-11-06 DIAGNOSIS — R030 Elevated blood-pressure reading, without diagnosis of hypertension: Secondary | ICD-10-CM | POA: Diagnosis not present

## 2016-12-18 ENCOUNTER — Encounter: Payer: 59 | Admitting: Family Medicine

## 2017-01-07 ENCOUNTER — Other Ambulatory Visit: Payer: Self-pay | Admitting: Family Medicine

## 2017-01-07 MED ORDER — ZOLPIDEM TARTRATE 10 MG PO TABS: 10.0000 mg | ORAL_TABLET | Freq: Every evening | ORAL | 5 refills | Status: DC | PRN

## 2017-01-07 NOTE — Telephone Encounter (Signed)
Please review-aa 

## 2017-01-07 NOTE — Telephone Encounter (Signed)
[facility] faxed a request for the following medication. Thanks CC   zolpidem (AMBIEN) 10 MG tablet

## 2017-01-21 ENCOUNTER — Other Ambulatory Visit: Payer: Self-pay | Admitting: Family Medicine

## 2017-01-21 NOTE — Telephone Encounter (Signed)
[facility] faxed a request for the following medication. Thanks CC  zolpidem (AMBIEN) 10 MG tablet

## 2017-01-22 MED ORDER — ZOLPIDEM TARTRATE 10 MG PO TABS: 10.0000 mg | ORAL_TABLET | Freq: Every evening | ORAL | 5 refills | Status: DC | PRN

## 2017-02-11 ENCOUNTER — Ambulatory Visit (INDEPENDENT_AMBULATORY_CARE_PROVIDER_SITE_OTHER): Payer: 59 | Admitting: Family Medicine

## 2017-02-11 ENCOUNTER — Encounter: Payer: Self-pay | Admitting: Family Medicine

## 2017-02-11 VITALS — BP 134/80 | HR 82 | Temp 98.0°F | Resp 16 | Ht 71.0 in | Wt 185.0 lb

## 2017-02-11 DIAGNOSIS — Z0000 Encounter for general adult medical examination without abnormal findings: Secondary | ICD-10-CM | POA: Diagnosis not present

## 2017-02-11 DIAGNOSIS — E119 Type 2 diabetes mellitus without complications: Secondary | ICD-10-CM | POA: Diagnosis not present

## 2017-02-11 DIAGNOSIS — M533 Sacrococcygeal disorders, not elsewhere classified: Secondary | ICD-10-CM

## 2017-02-11 LAB — POCT URINALYSIS DIPSTICK
BILIRUBIN UA: NEGATIVE
GLUCOSE UA: NEGATIVE
KETONES UA: NEGATIVE
Leukocytes, UA: NEGATIVE
NITRITE UA: NEGATIVE
Protein, UA: NEGATIVE
RBC UA: NEGATIVE
SPEC GRAV UA: 1.01 (ref 1.010–1.025)
Urobilinogen, UA: 0.2 E.U./dL
pH, UA: 7 (ref 5.0–8.0)

## 2017-02-11 NOTE — Progress Notes (Signed)
Patient: Shawn Crawford, Male    DOB: 1960/11/18, 56 y.o.   MRN: [id number] Visit Date: 02/11/2017  Today's Provider: [name], MD   Chief Complaint  Patient presents with  . Annual Exam   Subjective:    Annual physical exam Shawn Crawford is a 56 y.o. male who presents today for health maintenance and complete physical. He feels well. He reports exercising some, but not like he used to due to his chronic back pain. He reports he is sleeping fairly well.  ----------------------------------------------------------------- Colonoscopy- 10/25/10 internal hemorrhoids  Immunization History  Administered Date(s) Administered  . Influenza,inj,Quad PF,36+ Mos 05/30/2015, 06/16/2016  . Pneumococcal Conjugate-13 08/21/2015  . Tdap 12/04/2008     Review of Systems  HENT: Negative.   Eyes: Negative.   Respiratory: Negative.   Cardiovascular: Negative.   Gastrointestinal: Negative.   Endocrine: Negative.   Genitourinary: Negative.   Musculoskeletal: Positive for back pain.  Skin: Negative.   Allergic/Immunologic: Negative.   Neurological: Positive for weakness (from back problems).  Hematological: Negative.   Psychiatric/Behavioral: Negative.     Social History      He  reports that he has never smoked. He has never used smokeless tobacco. He reports that he drinks alcohol. He reports that he does not use drugs.       Social History   Social History  . Marital status: Single    Spouse name: N/A  . Number of children: N/A  . Years of education: N/A   Social History Main Topics  . Smoking status: Never Smoker  . Smokeless tobacco: Never Used  . Alcohol use 0.0 oz/week     Comment: glass of wine occasionally  . Drug use: No  . Sexual activity: Not Asked   Other Topics Concern  . None   Social History Narrative  . None    History reviewed. No pertinent past medical history.   Patient Active Problem List   Diagnosis Date Noted  . Diabetes  mellitus, type 2 ([place]) 05/30/2015  . Abnormal blood sugar 05/10/2015  . Arteriosclerosis of coronary artery 05/10/2015  . ED (erectile dysfunction) of organic origin 05/10/2015  . Acid reflux 05/10/2015  . HLD (hyperlipidemia) 05/10/2015  . BP (high blood pressure) 05/10/2015  . Lumbar radiculopathy 05/10/2015  . Adiposity 05/10/2015  . Avitaminosis D 05/10/2015  . Benign essential HTN 05/07/2015    Past Surgical History:  Procedure Laterality Date  . APPENDECTOMY    . CORONARY ARTERY BYPASS GRAFT     x 3  . HERNIA REPAIR      Family History        Family Status  Relation Status  . Mother Alive  . Father Deceased  . Sister Alive  . Son Alive  . Sister Alive  . Son Alive        His family history includes Colon cancer in his sister; Diabetes in his father; Hyperlipidemia in his mother; Hypertension in his father; Thyroid cancer in his sister.     Allergies  Allergen Reactions  . Penicillins      Current Outpatient Prescriptions:  .  aspirin 325 MG tablet, Take by mouth., Disp: , Rfl:  .  COENZYME Q-10 PO, Take by mouth., Disp: , Rfl:  .  cyclobenzaprine (FLEXERIL) 10 MG tablet, Take by mouth., Disp: , Rfl:  .  fexofenadine (ALLEGRA) 180 MG tablet, Take by mouth., Disp: , Rfl:  .  metoprolol tartrate (LOPRESSOR) 25 MG tablet, Take  by mouth 2 (two) times daily. , Disp: , Rfl:  .  MULTIPLE VITAMINS PO, Take by mouth., Disp: , Rfl:  .  Omega-3 Fatty Acids (FISH OIL) 1200 MG CAPS, Take by mouth., Disp: , Rfl:  .  rosuvastatin (CRESTOR) 40 MG tablet, Take 1 tablet (40 mg total) by mouth daily., Disp: 90 tablet, Rfl: 3 .  zolpidem (AMBIEN) 10 MG tablet, Take 1 tablet (10 mg total) by mouth at bedtime as needed for sleep., Disp: 30 tablet, Rfl: 5 .  omeprazole (PRILOSEC) 20 MG capsule, Take by mouth., Disp: , Rfl:  .  rosuvastatin (CRESTOR) 40 MG tablet, TAKE 1 TABLET BY MOUTH  DAILY, Disp: 90 tablet, Rfl: 3   Patient Care Team: Jerrol Banana., MD as PCP - General  (Family Medicine)      Objective:   Vitals: BP 134/80 (BP Location: Left Arm, Patient Position: Sitting, Cuff Size: Normal)   Pulse 82   Temp 98 F (36.7 C) (Oral)   Resp 16   Ht 5\' 11"  (1.803 m)   Wt 185 lb (83.9 kg)   BMI 25.80 kg/m    Vitals:   02/11/17 0956  BP: 134/80  Pulse: 82  Resp: 16  Temp: 98 F (36.7 C)  TempSrc: Oral  Weight: 185 lb (83.9 kg)  Height: 5\' 11"  (1.803 m)     Physical Exam  Constitutional: He is oriented to person, place, and time. He appears well-developed and well-nourished.  HENT:  Head: Normocephalic and atraumatic.  Right Ear: External ear normal.  Left Ear: External ear normal.  Nose: Nose normal.  Mouth/Throat: Oropharynx is clear and moist.  Eyes: Conjunctivae and EOM are normal. Pupils are equal, round, and reactive to light.  Neck: Normal range of motion. Neck supple.  Cardiovascular: Normal rate, regular rhythm, normal heart sounds and intact distal pulses.   Pulmonary/Chest: Effort normal and breath sounds normal.  Abdominal: Soft. Bowel sounds are normal.  Musculoskeletal: Normal range of motion.  Neurological: He is alert and oriented to person, place, and time. He has normal reflexes.  Skin: Skin is warm and dry.  Psychiatric: He has a normal mood and affect. His behavior is normal. Judgment and thought content normal.     Depression Screen PHQ 2/9 Scores 02/11/2017 05/30/2015  PHQ - 2 Score 0 0  PHQ- 9 Score 2 0      Assessment & Plan:     Routine Health Maintenance and Physical Exam  Exercise Activities and Dietary recommendations Goals    None      Immunization History  Administered Date(s) Administered  . Influenza,inj,Quad PF,36+ Mos 05/30/2015, 06/16/2016  . Pneumococcal Conjugate-13 08/21/2015  . Tdap 12/04/2008    Health Maintenance  Topic Date Due  . Hepatitis C Screening  Mar 25, 1961  . PNEUMOCOCCAL POLYSACCHARIDE VACCINE (1) 05/17/1962  . FOOT EXAM  05/17/1970  . OPHTHALMOLOGY EXAM   05/17/1970  . URINE MICROALBUMIN  05/17/1970  . HIV Screening  05/18/1975  . HEMOGLOBIN A1C  12/14/2016  . INFLUENZA VACCINE  04/29/2017  . TETANUS/TDAP  12/05/2018  . COLONOSCOPY  10/25/2020     Discussed health benefits of physical activity, and encouraged him to engage in regular exercise appropriate for his age and condition.  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 S/p CABG    --------------------------------------------------------------------   I have done the exam and reviewed the above chart and it is accurate to the best of my knowledge. Development worker, community has been used in this  note in any air is in the dictation or transcription are unintentional.  [name], MD  [facility]

## 2017-02-12 DIAGNOSIS — Z0000 Encounter for general adult medical examination without abnormal findings: Secondary | ICD-10-CM | POA: Diagnosis not present

## 2017-02-12 DIAGNOSIS — E119 Type 2 diabetes mellitus without complications: Secondary | ICD-10-CM | POA: Diagnosis not present

## 2017-02-13 LAB — COMPREHENSIVE METABOLIC PANEL
ALT: 47 IU/L — AB (ref 0–44)
AST: 36 IU/L (ref 0–40)
Albumin/Globulin Ratio: 1.9 (ref 1.2–2.2)
Albumin: 4.8 g/dL (ref 3.5–5.5)
Alkaline Phosphatase: 59 IU/L (ref 39–117)
BUN/Creatinine Ratio: 21 — ABNORMAL HIGH (ref 9–20)
BUN: 27 mg/dL — ABNORMAL HIGH (ref 6–24)
Bilirubin Total: 0.4 mg/dL (ref 0.0–1.2)
CO2: 26 mmol/L (ref 18–29)
CREATININE: 1.29 mg/dL — AB (ref 0.76–1.27)
Calcium: 10 mg/dL (ref 8.7–10.2)
Chloride: 98 mmol/L (ref 96–106)
GFR, EST AFRICAN AMERICAN: 71 mL/min/{1.73_m2} (ref >59)
GFR, EST NON AFRICAN AMERICAN: 62 mL/min/{1.73_m2} (ref >59)
GLUCOSE: 122 mg/dL — AB (ref 65–99)
Globulin, Total: 2.5 g/dL (ref 1.5–4.5)
Potassium: 4.9 mmol/L (ref 3.5–5.2)
Sodium: 142 mmol/L (ref 134–144)
TOTAL PROTEIN: 7.3 g/dL (ref 6.0–8.5)

## 2017-02-13 LAB — CBC WITH DIFFERENTIAL/PLATELET
BASOS: 1 %
Basophils Absolute: 0 10*3/uL (ref 0.0–0.2)
EOS (ABSOLUTE): 0.2 10*3/uL (ref 0.0–0.4)
EOS: 3 %
HEMATOCRIT: 43.9 % (ref 37.5–51.0)
HEMOGLOBIN: 15.7 g/dL (ref 13.0–17.7)
IMMATURE GRANS (ABS): 0 10*3/uL (ref 0.0–0.1)
Immature Granulocytes: 0 %
Lymphocytes Absolute: 1.4 10*3/uL (ref 0.7–3.1)
Lymphs: 21 %
MCH: 31.4 pg (ref 26.6–33.0)
MCHC: 35.8 g/dL — AB (ref 31.5–35.7)
MCV: 88 fL (ref 79–97)
MONOCYTES: 9 %
Monocytes Absolute: 0.6 10*3/uL (ref 0.1–0.9)
NEUTROS ABS: 4.4 10*3/uL (ref 1.4–7.0)
Neutrophils: 66 %
Platelets: 176 10*3/uL (ref 150–379)
RBC: 5 x10E6/uL (ref 4.14–5.80)
RDW: 12.6 % (ref 12.3–15.4)
WBC: 6.6 10*3/uL (ref 3.4–10.8)

## 2017-02-13 LAB — LIPID PANEL WITH LDL/HDL RATIO
CHOLESTEROL TOTAL: 168 mg/dL (ref 100–199)
HDL: 41 mg/dL (ref >39)
LDL CALC: 90 mg/dL (ref 0–99)
LDL/HDL RATIO: 2.2 ratio (ref 0.0–3.6)
Triglycerides: 186 mg/dL — ABNORMAL HIGH (ref 0–149)
VLDL CHOLESTEROL CAL: 37 mg/dL (ref 5–40)

## 2017-02-13 LAB — HEMOGLOBIN A1C
Est. average glucose Bld gHb Est-mCnc: 120 mg/dL
Hgb A1c MFr Bld: 5.8 % — ABNORMAL HIGH (ref 4.8–5.6)

## 2017-02-13 LAB — TSH: TSH: 1.53 u[IU]/mL (ref 0.450–4.500)

## 2017-02-16 ENCOUNTER — Telehealth: Payer: Self-pay

## 2017-02-16 NOTE — Telephone Encounter (Signed)
Advised pt of lab results. Pt verbally acknowledges understanding. Emily Drozdowski, CMA   

## 2017-02-16 NOTE — Telephone Encounter (Signed)
-----   Message from Jerrol Banana., MD sent at 02/14/2017 12:23 PM EDT ----- Mildly dehydrated.

## 2017-03-05 ENCOUNTER — Encounter: Payer: Self-pay | Admitting: Family Medicine

## 2017-03-09 DIAGNOSIS — M5442 Lumbago with sciatica, left side: Secondary | ICD-10-CM | POA: Diagnosis not present

## 2017-03-09 DIAGNOSIS — M9903 Segmental and somatic dysfunction of lumbar region: Secondary | ICD-10-CM | POA: Diagnosis not present

## 2017-03-09 DIAGNOSIS — M5441 Lumbago with sciatica, right side: Secondary | ICD-10-CM | POA: Diagnosis not present

## 2017-03-10 DIAGNOSIS — M5441 Lumbago with sciatica, right side: Secondary | ICD-10-CM | POA: Diagnosis not present

## 2017-03-10 DIAGNOSIS — M9903 Segmental and somatic dysfunction of lumbar region: Secondary | ICD-10-CM | POA: Diagnosis not present

## 2017-03-10 DIAGNOSIS — M5442 Lumbago with sciatica, left side: Secondary | ICD-10-CM | POA: Diagnosis not present

## 2017-03-11 DIAGNOSIS — M5442 Lumbago with sciatica, left side: Secondary | ICD-10-CM | POA: Diagnosis not present

## 2017-03-11 DIAGNOSIS — M5441 Lumbago with sciatica, right side: Secondary | ICD-10-CM | POA: Diagnosis not present

## 2017-03-11 DIAGNOSIS — M9903 Segmental and somatic dysfunction of lumbar region: Secondary | ICD-10-CM | POA: Diagnosis not present

## 2017-03-16 DIAGNOSIS — M5441 Lumbago with sciatica, right side: Secondary | ICD-10-CM | POA: Diagnosis not present

## 2017-03-16 DIAGNOSIS — M5442 Lumbago with sciatica, left side: Secondary | ICD-10-CM | POA: Diagnosis not present

## 2017-03-16 DIAGNOSIS — M9903 Segmental and somatic dysfunction of lumbar region: Secondary | ICD-10-CM | POA: Diagnosis not present

## 2017-03-17 ENCOUNTER — Other Ambulatory Visit: Payer: Self-pay | Admitting: Emergency Medicine

## 2017-03-17 DIAGNOSIS — M5441 Lumbago with sciatica, right side: Secondary | ICD-10-CM | POA: Diagnosis not present

## 2017-03-17 DIAGNOSIS — M5416 Radiculopathy, lumbar region: Secondary | ICD-10-CM

## 2017-03-17 DIAGNOSIS — M533 Sacrococcygeal disorders, not elsewhere classified: Secondary | ICD-10-CM

## 2017-03-17 DIAGNOSIS — M9903 Segmental and somatic dysfunction of lumbar region: Secondary | ICD-10-CM | POA: Diagnosis not present

## 2017-03-17 DIAGNOSIS — M5442 Lumbago with sciatica, left side: Secondary | ICD-10-CM | POA: Diagnosis not present

## 2017-03-18 DIAGNOSIS — M5442 Lumbago with sciatica, left side: Secondary | ICD-10-CM | POA: Diagnosis not present

## 2017-03-18 DIAGNOSIS — M5441 Lumbago with sciatica, right side: Secondary | ICD-10-CM | POA: Diagnosis not present

## 2017-03-18 DIAGNOSIS — M9903 Segmental and somatic dysfunction of lumbar region: Secondary | ICD-10-CM | POA: Diagnosis not present

## 2017-04-10 ENCOUNTER — Encounter: Payer: Self-pay | Admitting: Family Medicine

## 2017-04-10 ENCOUNTER — Telehealth: Payer: Self-pay

## 2017-04-10 DIAGNOSIS — M549 Dorsalgia, unspecified: Secondary | ICD-10-CM

## 2017-04-10 NOTE — Telephone Encounter (Signed)
Order placed, sarah notified and patient advised through [employer]

## 2017-04-10 NOTE — Telephone Encounter (Signed)
I have completed my rounds of chiropractic sessions with Dr Milagros Reap. It had minimal effect so I would like to proceed with PT. I request to use [place] office. The number is [phone]. If Dr Rosanna Randy will make the referral I can contact to set up appointments. This is in reference to my SI back issue. Thanks. Please review, above was sent from [facility]

## 2017-04-10 NOTE — Telephone Encounter (Signed)
Ok for referral-thanks

## 2017-04-14 DIAGNOSIS — M545 Low back pain: Secondary | ICD-10-CM | POA: Diagnosis not present

## 2017-04-21 DIAGNOSIS — M545 Low back pain: Secondary | ICD-10-CM | POA: Diagnosis not present

## 2017-04-23 DIAGNOSIS — M545 Low back pain: Secondary | ICD-10-CM | POA: Diagnosis not present

## 2017-05-12 ENCOUNTER — Ambulatory Visit (INDEPENDENT_AMBULATORY_CARE_PROVIDER_SITE_OTHER): Payer: 59 | Admitting: Family Medicine

## 2017-05-12 VITALS — BP 162/88 | HR 82 | Temp 98.5°F | Resp 18 | Wt 187.8 lb

## 2017-05-12 DIAGNOSIS — R29898 Other symptoms and signs involving the musculoskeletal system: Secondary | ICD-10-CM | POA: Diagnosis not present

## 2017-05-12 DIAGNOSIS — M5442 Lumbago with sciatica, left side: Secondary | ICD-10-CM | POA: Diagnosis not present

## 2017-05-12 DIAGNOSIS — G8929 Other chronic pain: Secondary | ICD-10-CM | POA: Diagnosis not present

## 2017-05-12 DIAGNOSIS — M533 Sacrococcygeal disorders, not elsewhere classified: Secondary | ICD-10-CM | POA: Diagnosis not present

## 2017-05-12 DIAGNOSIS — M5441 Lumbago with sciatica, right side: Secondary | ICD-10-CM

## 2017-05-12 MED ORDER — PREDNISONE 10 MG (48) PO TBPK: ORAL_TABLET | ORAL | 0 refills | Status: DC

## 2017-05-12 NOTE — Progress Notes (Signed)
Shawn Crawford  MRN: [id number] DOB: 11-Dec-1960  Subjective:  HPI  Patient is here to discuss his lower chronic back pain that is continuing to get worse. This started in 2015. He has lower back pain, pain can also develop in groin and buttocks area, legs feel achy and starting to get weak in his legs. No falls so far but noticing he is stumbling more. He does get some tingling sensation also. Symptoms are constant, daily. Patient has been to see Dr Arnoldo Morale, has seen neurologist/physiatrist, has seen chiropractor, has done physical therapy more than once, has tried injections and oral medications. Symptoms continue to progress and he does not know what to do at this time. This is affecting his quality of life, he is limited to what he can do and his sleep is been interrupted. The only way his pain improves if he lays down on either side of his body, any other positions or movements cause pain.  Wt Readings from Last 3 Encounters:  05/12/17 187 lb 12.8 oz (85.2 kg)  02/11/17 185 lb (83.9 kg)  06/16/16 187 lb (84.8 kg)    Patient Active Problem List   Diagnosis Date Noted  . Hyperglycemia 05/30/2015  . Abnormal blood sugar 05/10/2015  . Arteriosclerosis of coronary artery 05/10/2015  . ED (erectile dysfunction) of organic origin 05/10/2015  . Acid reflux 05/10/2015  . HLD (hyperlipidemia) 05/10/2015  . BP (high blood pressure) 05/10/2015  . Lumbar radiculopathy 05/10/2015  . Adiposity 05/10/2015  . Avitaminosis D 05/10/2015  . Benign essential HTN 05/07/2015    No past medical history on file.  Social History   Social History  . Marital status: Single    Spouse name: N/A  . Number of children: N/A  . Years of education: N/A   Occupational History  . Not on file.   Social History Main Topics  . Smoking status: Never Smoker  . Smokeless tobacco: Never Used  . Alcohol use 0.0 oz/week     Comment: glass of wine occasionally  . Drug use: No  . Sexual activity: Not on  file   Other Topics Concern  . Not on file   Social History Narrative  . No narrative on file    Outpatient Encounter Prescriptions as of 05/12/2017  Medication Sig Note  . aspirin 325 MG tablet Take by mouth. 05/10/2015: Received from: [facility]  . COENZYME Q-10 PO Take by mouth. 05/10/2015: Received from: [facility]  . cyclobenzaprine (FLEXERIL) 10 MG tablet Take by mouth. 05/10/2015: Medication taken as needed.  Received from: [facility]  . fexofenadine (ALLEGRA) 180 MG tablet Take by mouth.   . metoprolol tartrate (LOPRESSOR) 25 MG tablet Take by mouth 2 (two) times daily.  05/10/2015: Received from: [facility]  . MULTIPLE VITAMINS PO Take by mouth. 05/10/2015: Received from: [facility]  . Omega-3 Fatty Acids (FISH OIL) 1200 MG CAPS Take by mouth. 05/10/2015: Received from: [facility]  . rosuvastatin (CRESTOR) 40 MG tablet Take 1 tablet (40 mg total) by mouth daily.   Marland Kitchen zolpidem (AMBIEN) 10 MG tablet Take 1 tablet (10 mg total) by mouth at bedtime as needed for sleep.   . [DISCONTINUED] omeprazole (PRILOSEC) 20 MG capsule Take by mouth. 05/10/2015: Received from: [facility]  . [DISCONTINUED] rosuvastatin (CRESTOR) 40 MG tablet TAKE 1 TABLET BY MOUTH  DAILY    No facility-administered encounter medications on file as of 05/12/2017.     Allergies  Allergen Reactions  . Penicillins     Review of Systems  Constitutional: Positive for malaise/fatigue.  Respiratory: Negative.   Cardiovascular: Negative.   Musculoskeletal: Positive for back pain and joint pain. Negative for falls.       Ache sensation in the legs.  Neurological: Positive for tingling and weakness.  Psychiatric/Behavioral: The patient has insomnia (due to the pain).     Objective:  BP (!) 162/88   Pulse 82   Temp 98.5 F (36.9 C)   Resp 18   Wt 187 lb 12.8 oz (85.2 kg)   BMI 26.19  kg/m   Physical Exam  Constitutional: He is oriented to person, place, and time and well-developed, well-nourished, and in no distress.  HENT:  Head: Normocephalic and atraumatic.  Eyes: Conjunctivae are normal. No scleral icterus.  Neck: No thyromegaly present.  Cardiovascular: Normal rate.   Pulmonary/Chest: Effort normal.  Neurological: He is alert and oriented to person, place, and time. No cranial nerve deficit. He exhibits normal muscle tone. Gait normal. Coordination normal. GCS score is 15.  Skin: Skin is warm and dry.  Psychiatric: Mood, memory, affect and judgment normal.    Assessment and Plan :  1. Chronic bilateral low back pain with bilateral sciatica Treat with Prednisone. This is progressively getting worse. Follow up in 1 to 2 weeks. Check labs. - predniSONE (STERAPRED UNI-PAK 48 TAB) 10 MG (48) TBPK tablet; As directed  Dispense: 48 tablet; Refill: 0 - Sed Rate (ESR) - Antinuclear Antib (ANA) - HLA-B27 antigen  2. Sacroiliac joint disease - predniSONE (STERAPRED UNI-PAK 48 TAB) 10 MG (48) TBPK tablet; As directed  Dispense: 48 tablet; Refill: 0 - Sed Rate (ESR) - Antinuclear Antib (ANA) - HLA-B27 antigen  3. Weakness of both legs - Sed Rate (ESR) - Antinuclear Antib (ANA) - HLA-B27 antigen HPI, Exam and A&P transcribed by Theressa Millard, RMA under direction and in the presence of Miguel Aschoff, MD. I have done the exam and reviewed the chart and it is accurate to the best of my knowledge. Development worker, community has been used and  any errors in dictation or transcription are unintentional. Miguel Aschoff M.D. [facility]

## 2017-05-13 DIAGNOSIS — L812 Freckles: Secondary | ICD-10-CM | POA: Diagnosis not present

## 2017-05-13 DIAGNOSIS — L578 Other skin changes due to chronic exposure to nonionizing radiation: Secondary | ICD-10-CM | POA: Diagnosis not present

## 2017-05-13 DIAGNOSIS — L570 Actinic keratosis: Secondary | ICD-10-CM | POA: Diagnosis not present

## 2017-05-13 DIAGNOSIS — B360 Pityriasis versicolor: Secondary | ICD-10-CM | POA: Diagnosis not present

## 2017-05-15 LAB — ANA: ANA: NEGATIVE

## 2017-05-15 LAB — HLA-B27 ANTIGEN: HLA B27: NEGATIVE

## 2017-05-15 LAB — SEDIMENTATION RATE: SED RATE: 2 mm/h (ref 0–30)

## 2017-05-26 ENCOUNTER — Ambulatory Visit (INDEPENDENT_AMBULATORY_CARE_PROVIDER_SITE_OTHER): Payer: 59 | Admitting: Family Medicine

## 2017-05-26 VITALS — BP 144/86 | HR 64 | Temp 98.2°F | Resp 16 | Wt 189.0 lb

## 2017-05-26 DIAGNOSIS — G8929 Other chronic pain: Secondary | ICD-10-CM | POA: Diagnosis not present

## 2017-05-26 DIAGNOSIS — Z23 Encounter for immunization: Secondary | ICD-10-CM

## 2017-05-26 NOTE — Progress Notes (Addendum)
Patient: Shawn Crawford Male    DOB: [birth year]   56 y.o.   MRN: [id number] Visit Date: 05/26/2017  Today's Provider: [name], MD   Chronic pain  Subjective:    HPI Pt is here today for a follow up of chronic back pain. He was treat with prednisone and to follow up in 1-2 weeks. We checked Sed rate, ANA and HLA B-27 antigen. Labs were normal.  Patient was treated with Prednisone taper.  He states he did not get any relief from the steroid.     Allergies  Allergen Reactions  . Penicillins      Current Outpatient Prescriptions:  .  aspirin 325 MG tablet, Take by mouth., Disp: , Rfl:  .  COENZYME Q-10 PO, Take by mouth., Disp: , Rfl:  .  cyclobenzaprine (FLEXERIL) 10 MG tablet, Take by mouth., Disp: , Rfl:  .  fexofenadine (ALLEGRA) 180 MG tablet, Take by mouth., Disp: , Rfl:  .  metoprolol tartrate (LOPRESSOR) 25 MG tablet, Take by mouth 2 (two) times daily. , Disp: , Rfl:  .  MULTIPLE VITAMINS PO, Take by mouth., Disp: , Rfl:  .  Omega-3 Fatty Acids (FISH OIL) 1200 MG CAPS, Take by mouth., Disp: , Rfl:  .  rosuvastatin (CRESTOR) 40 MG tablet, Take 1 tablet (40 mg total) by mouth daily., Disp: 90 tablet, Rfl: 3 .  zolpidem (AMBIEN) 10 MG tablet, Take 1 tablet (10 mg total) by mouth at bedtime as needed for sleep., Disp: 30 tablet, Rfl: 5  Review of Systems  Constitutional: Negative for fatigue.  Eyes: Negative.   Respiratory: Negative for choking, shortness of breath and wheezing.   Cardiovascular: Negative for chest pain and palpitations.  Musculoskeletal: Positive for back pain. Negative for arthralgias, gait problem, joint swelling, myalgias, neck pain and neck stiffness.  Skin: Negative.   Allergic/Immunologic: Negative.   Neurological: Negative.   Psychiatric/Behavioral: Negative.     Social History  Substance Use Topics  . Smoking status: Never Smoker  . Smokeless tobacco: Never Used  . Alcohol use 0.0 oz/week     Comment: glass of wine  occasionally   Objective:   BP (!) 144/86 (BP Location: Right Arm, Patient Position: Sitting, Cuff Size: Normal)   Pulse 64   Temp 98.2 F (36.8 C) (Oral)   Resp 16   Wt 189 lb (85.7 kg)   BMI 26.36 kg/m  Vitals:   05/26/17 1441  BP: (!) 144/86  Pulse: 64  Resp: 16  Temp: 98.2 F (36.8 C)  TempSrc: Oral  Weight: 189 lb (85.7 kg)     Physical Exam  Constitutional: He is oriented to person, place, and time. He appears well-developed and well-nourished.  HENT:  Head: Normocephalic and atraumatic.  Eyes: Conjunctivae are normal. No scleral icterus.  Neck: No thyromegaly present.  Cardiovascular: Normal rate, regular rhythm and normal heart sounds.   Pulmonary/Chest: Effort normal and breath sounds normal.  Abdominal: Soft.  Neurological: He is alert and oriented to person, place, and time.  Skin: Skin is warm and dry.  Psychiatric: He has a normal mood and affect. His behavior is normal. Judgment and thought content normal.      Assessment & Plan:   Chronic Low Back Pain DDD vs Sacroiliitis vs spinal stenosis.. No relief with prednisone. Refer to Physiatry.   I have done the exam and reviewed the above chart and it is accurate to the best of my knowledge. Development worker, community  has been used in this note in any air is in the dictation or transcription are unintentional.  [name], MD  [place]

## 2017-05-27 DIAGNOSIS — Z23 Encounter for immunization: Secondary | ICD-10-CM

## 2017-05-27 DIAGNOSIS — G8929 Other chronic pain: Secondary | ICD-10-CM | POA: Diagnosis not present

## 2017-05-28 DIAGNOSIS — I10 Essential (primary) hypertension: Secondary | ICD-10-CM | POA: Diagnosis not present

## 2017-05-28 DIAGNOSIS — I2510 Atherosclerotic heart disease of native coronary artery without angina pectoris: Secondary | ICD-10-CM | POA: Diagnosis not present

## 2017-05-28 DIAGNOSIS — E782 Mixed hyperlipidemia: Secondary | ICD-10-CM | POA: Diagnosis not present

## 2017-06-02 ENCOUNTER — Telehealth: Payer: Self-pay

## 2017-06-02 DIAGNOSIS — G8929 Other chronic pain: Secondary | ICD-10-CM

## 2017-06-02 NOTE — Telephone Encounter (Signed)
Per Sarah in referrals needed to fix the order for Dr Calton Golds

## 2017-06-08 ENCOUNTER — Telehealth: Payer: Self-pay | Admitting: Family Medicine

## 2017-06-08 DIAGNOSIS — G8929 Other chronic pain: Secondary | ICD-10-CM

## 2017-06-08 NOTE — Telephone Encounter (Signed)
Patient advised as below and is ok with proceeding with referral to pain clinic. Order placed-aa

## 2017-06-08 NOTE — Telephone Encounter (Signed)
Please review-aa 

## 2017-06-08 NOTE — Telephone Encounter (Signed)
ok 

## 2017-06-08 NOTE — Telephone Encounter (Signed)
FYI--Dr Chasnis refused referral stating he does not feel he can help pt.His suggestion is to refer to pain clinic

## 2017-06-15 ENCOUNTER — Other Ambulatory Visit: Payer: Self-pay | Admitting: Family Medicine

## 2017-06-15 MED ORDER — ZOLPIDEM TARTRATE 10 MG PO TABS: 10.0000 mg | ORAL_TABLET | Freq: Every evening | ORAL | 5 refills | Status: DC | PRN

## 2017-06-15 NOTE — Telephone Encounter (Signed)
[facility] faxed a request on the following medication. Thanks CC  zolpidem (AMBIEN) 10 MG tablet

## 2017-07-07 ENCOUNTER — Ambulatory Visit
Payer: 59 | Attending: Student in an Organized Health Care Education/Training Program | Admitting: Student in an Organized Health Care Education/Training Program

## 2017-07-07 ENCOUNTER — Encounter: Payer: Self-pay | Admitting: Student in an Organized Health Care Education/Training Program

## 2017-07-07 VITALS — BP 147/96 | HR 79 | Temp 98.4°F | Resp 16 | Ht 71.0 in | Wt 183.0 lb

## 2017-07-07 DIAGNOSIS — E559 Vitamin D deficiency, unspecified: Secondary | ICD-10-CM | POA: Insufficient documentation

## 2017-07-07 DIAGNOSIS — M7918 Myalgia, other site: Secondary | ICD-10-CM | POA: Diagnosis not present

## 2017-07-07 DIAGNOSIS — Z79899 Other long term (current) drug therapy: Secondary | ICD-10-CM | POA: Insufficient documentation

## 2017-07-07 DIAGNOSIS — G894 Chronic pain syndrome: Secondary | ICD-10-CM | POA: Diagnosis not present

## 2017-07-07 DIAGNOSIS — E669 Obesity, unspecified: Secondary | ICD-10-CM | POA: Insufficient documentation

## 2017-07-07 DIAGNOSIS — Z951 Presence of aortocoronary bypass graft: Secondary | ICD-10-CM | POA: Insufficient documentation

## 2017-07-07 DIAGNOSIS — E785 Hyperlipidemia, unspecified: Secondary | ICD-10-CM | POA: Insufficient documentation

## 2017-07-07 DIAGNOSIS — M48061 Spinal stenosis, lumbar region without neurogenic claudication: Secondary | ICD-10-CM | POA: Insufficient documentation

## 2017-07-07 DIAGNOSIS — K219 Gastro-esophageal reflux disease without esophagitis: Secondary | ICD-10-CM | POA: Insufficient documentation

## 2017-07-07 DIAGNOSIS — Z808 Family history of malignant neoplasm of other organs or systems: Secondary | ICD-10-CM | POA: Diagnosis not present

## 2017-07-07 DIAGNOSIS — M5116 Intervertebral disc disorders with radiculopathy, lumbar region: Secondary | ICD-10-CM | POA: Diagnosis not present

## 2017-07-07 DIAGNOSIS — M47816 Spondylosis without myelopathy or radiculopathy, lumbar region: Secondary | ICD-10-CM

## 2017-07-07 DIAGNOSIS — M791 Myalgia, unspecified site: Secondary | ICD-10-CM | POA: Diagnosis not present

## 2017-07-07 DIAGNOSIS — I2510 Atherosclerotic heart disease of native coronary artery without angina pectoris: Secondary | ICD-10-CM | POA: Insufficient documentation

## 2017-07-07 DIAGNOSIS — M1288 Other specific arthropathies, not elsewhere classified, other specified site: Secondary | ICD-10-CM | POA: Insufficient documentation

## 2017-07-07 DIAGNOSIS — Z7982 Long term (current) use of aspirin: Secondary | ICD-10-CM | POA: Insufficient documentation

## 2017-07-07 DIAGNOSIS — E079 Disorder of thyroid, unspecified: Secondary | ICD-10-CM | POA: Insufficient documentation

## 2017-07-07 DIAGNOSIS — Z87442 Personal history of urinary calculi: Secondary | ICD-10-CM | POA: Diagnosis not present

## 2017-07-07 DIAGNOSIS — M545 Low back pain: Secondary | ICD-10-CM | POA: Diagnosis present

## 2017-07-07 DIAGNOSIS — M5136 Other intervertebral disc degeneration, lumbar region: Secondary | ICD-10-CM | POA: Diagnosis not present

## 2017-07-07 DIAGNOSIS — N529 Male erectile dysfunction, unspecified: Secondary | ICD-10-CM | POA: Insufficient documentation

## 2017-07-07 DIAGNOSIS — I129 Hypertensive chronic kidney disease with stage 1 through stage 4 chronic kidney disease, or unspecified chronic kidney disease: Secondary | ICD-10-CM | POA: Diagnosis not present

## 2017-07-07 DIAGNOSIS — F329 Major depressive disorder, single episode, unspecified: Secondary | ICD-10-CM | POA: Diagnosis not present

## 2017-07-07 DIAGNOSIS — N189 Chronic kidney disease, unspecified: Secondary | ICD-10-CM | POA: Diagnosis not present

## 2017-07-07 MED ORDER — TIZANIDINE HCL 4 MG PO TABS: 4.0000 mg | ORAL_TABLET | Freq: Three times a day (TID) | ORAL | 1 refills | Status: DC | PRN

## 2017-07-07 NOTE — Progress Notes (Signed)
Safety precautions to be maintained throughout the outpatient stay will include: orient to surroundings, keep bed in low position, maintain call bell within reach at all times, provide assistance with transfer out of bed and ambulation.  

## 2017-07-07 NOTE — Progress Notes (Addendum)
Patient's Name: Shawn Crawford  MRN: [id number]  Referring Provider: Jerrol Crawford.,*  DOB: Mar 31, 1961  PCP: Shawn Crawford., MD  DOS: 07/07/2017  Note by: Gillis Santa, MD  Service setting: Ambulatory outpatient  Specialty: Interventional Pain Management  Location: [facility] (AMB) Pain Management Facility  Visit type: Initial Patient Evaluation  Patient type: New Patient   Primary Reason(s) for Visit: Encounter for initial evaluation of one or more chronic problems (new to examiner) potentially causing chronic pain, and posing a threat to normal musculoskeletal function. (Level of risk: High) CC: Back Pain (lower)  HPI  Shawn Crawford is a 56 y.o. year old, male patient, who comes today to see [place] for the first time for an initial evaluation of his chronic pain. He has Abnormal blood sugar; Arteriosclerosis of coronary artery; ED (erectile dysfunction) of organic origin; Acid reflux; HLD (hyperlipidemia); BP (high blood pressure); Lumbar radiculopathy; Adiposity; Avitaminosis D; Hyperglycemia; and Benign essential HTN on his problem list. Today he comes in for evaluation of his Back Pain (lower)  Pain Assessment: Location: Lower Back Radiating: sometimes buttocks, groin, or both upper legs Onset: More than a month ago Duration: Chronic pain Quality: Aching Severity: 3 /10 (self-reported pain score)  Note: Reported level is compatible with observation.                   When using our objective Pain Scale, levels between 6 and 10/10 are said to belong in an emergency room, as it progressively worsens from a 6/10, described as severely limiting, requiring emergency care not usually available at an outpatient pain management facility. At a 6/10 level, communication becomes difficult and requires great effort. Assistance to reach the emergency department may be required. Facial flushing and profuse sweating along with potentially dangerous increases in heart rate and blood pressure will be  evident. Effect on ADL:   Timing: Constant (worse in afternoon and evenings) Modifying factors: lying on either side, TENS, heating pad  Onset and Duration: Gradual and Present longer than 3 months Cause of pain: Unknown Severity: Getting worse, NAS-11 at its worse: 7/10, NAS-11 at its best: 2/10, NAS-11 now: 2/10 and NAS-11 on the average: 4/10 Timing: Afternoon and Night Aggravating Factors: Prolonged sitting and Walking Alleviating Factors: Lying down, Sleeping, Standing and TENS Associated Problems: Tingling, Weakness and Pain that does not allow patient to sleep Quality of Pain: Aching, Deep, Disabling, Getting longer and Tiring Previous Examinations or Tests: MRI scan, Nerve block, Nerve conduction test, Neurological evaluation and Chiropractic evaluation Previous Treatments: Chiropractic manipulations, Physical Therapy, Radiofrequency, Steroid treatments by mouth and TENS  The patient comes into the clinics today for the first time for a chronic pain management evaluation.   56 year old male who presents with bilateral axial low back pain and bilateral buttock pain that started in 20/15 without an inciting event. Patient states that he used to travel quite a bit for his work in the past which she states may have contributed to his low back and buttock pain. Patient was previously seeing a pain clinic in [place]. He has done physical therapy on many occasions which has not been very effective. Patient has also had diagnostic bilateral lumbar facet nerve blocks which were not effective for his pain. Patient is tried Neurontin and Flexeril which did not get any significant benefit from. Pain rarely radiates below his buttock and posterior thighs.  Patient states that he used to walk 5 miles a day. He was very active. He states that  the pain has limited his activity level and his ability to participate in hobbies. He states that is impacting his mood and his mental health. He is having  trouble staying asleep at night secondary to the pain. No bowel or bladder dysfunction. No lower extremity weakness or sensory deficits appreciated.  Today I took the time to provide the patient with information regarding my pain practice. The patient was informed that my practice is divided into two sections: an interventional pain management section, as well as a completely separate and distinct medication management section. I explained that I have procedure days for my interventional therapies, and evaluation days for follow-ups and medication management. Because of the amount of documentation required during both, they are kept separated. This means that there is the possibility that he may be scheduled for a procedure on one day, and medication management the next. I have also informed him that because of staffing and facility limitations, I no longer take patients for medication management only. To illustrate the reasons for this, I gave the patient the example of surgeons, and how inappropriate it would be to refer a patient to his/her care, just to write for the post-surgical antibiotics on a surgery done by a different surgeon.   Because interventional pain management is my board-certified specialty, the patient was informed that joining my practice means that they are open to any and all interventional therapies. I made it clear that this does not mean that they will be forced to have any procedures done. What this means is that I believe interventional therapies to be essential part of the diagnosis and proper management of chronic pain conditions. Therefore, patients not interested in these interventional alternatives will be better served under the care of a different practitioner.  The patient was also made aware of my Comprehensive Pain Management Safety Guidelines where by joining my practice, they limit all of their nerve blocks and joint injections to those done by our practice, for as long  as we are retained to manage their care.   Historic Controlled Substance Pharmacotherapy Review  PMP and historical list of controlled substances: Ambien 10 mg daily at bedtime MME/day: 48m/day Medications: The patient did not bring the medication(s) to the appointment, as requested in our "New Patient Package" Pharmacodynamics: Desired effects: Analgesia: The patient reports >50% benefit. Reported improvement in function: The patient reports medication allows him to accomplish basic ADLs. Clinically meaningful improvement in function (CMIF): Sustained CMIF goals met Perceived effectiveness: Described as relatively effective, allowing for increase in activities of daily living (ADL) Undesirable effects: Side-effects or Adverse reactions: None reported Historical Monitoring: The patient  reports that he does not use drugs. List of all UDS Test(s): No results found for: MDMA, COCAINSCRNUR, PCPSCRNUR, PCPQUANT, CANNABQUANT, THCU, E[place]List of all Serum Drug Screening Test(s):  No results found for: AMPHSCRSER, BARBSCRSER, BENZOSCRSER, COCAINSCRSER, PCPSCRSER, PCPQUANT, THCSCRSER, CANNABQUANT, OPIATESCRSER, OXYSCRSER, PROPOXSCRSER Historical Background Evaluation: [place] PMP: Six (6) year initial data search conducted.             PMP NARX Score Report:  Narcotic: 110 Sedative: 251 Stimulant: 0 [place] Department of public safety, offender search: (Editor, commissioningInformation) Non-contributory Risk Assessment Profile: Aberrant behavior: None observed or detected today Risk factors for fatal opioid overdose: None identified today PMP NARX Overdose Risk Score: 70 Fatal overdose hazard ratio (HR): Calculation deferred Non-fatal overdose hazard ratio (HR): Calculation deferred Risk of opioid abuse or dependence: 0.7-3.0% with doses ? 36 MME/day and 6.1-26% with doses ? 1Eads  MME/day. Substance use disorder (SUD) risk level: Pending results of Medical Psychology Evaluation for SUD Opioid risk tool (ORT) (Total  Score): 0     Opioid Risk Tool - 07/07/17 1131      Family History of Substance Abuse   Alcohol Negative   Illegal Drugs Negative   Rx Drugs Negative     Personal History of Substance Abuse   Alcohol Negative   Illegal Drugs Negative   Rx Drugs Negative     Age   Age between 53-45 years  No     History of Preadolescent Sexual Abuse   History of Preadolescent Sexual Abuse Negative or Male     Psychological Disease   Psychological Disease Negative   Depression Negative     Total Score   Opioid Risk Tool Scoring 0   Opioid Risk Interpretation Low Risk      Pharmacologic Plan: Pending ordered tests and/or consults Mr. Plitt has expressed his preference to stay away from controlled substances. Initial impression: The patient indicated having no interest, at this time. patient wants to avoid opioid therapy.  Meds   Current Outpatient Prescriptions:  .  aspirin 325 MG tablet, Take 81 mg by mouth daily. , Disp: , Rfl:  .  COENZYME Q-10 PO, Take by mouth., Disp: , Rfl:  .  fexofenadine (ALLEGRA) 180 MG tablet, Take by mouth., Disp: , Rfl:  .  metoprolol tartrate (LOPRESSOR) 25 MG tablet, Take by mouth 2 (two) times daily. , Disp: , Rfl:  .  MULTIPLE VITAMINS PO, Take by mouth., Disp: , Rfl:  .  Omega-3 Fatty Acids (FISH OIL) 1200 MG CAPS, Take by mouth., Disp: , Rfl:  .  rosuvastatin (CRESTOR) 40 MG tablet, Take 1 tablet (40 mg total) by mouth daily., Disp: 90 tablet, Rfl: 3 .  zolpidem (AMBIEN) 10 MG tablet, Take 1 tablet (10 mg total) by mouth at bedtime as needed for sleep., Disp: 30 tablet, Rfl: 5 .  tiZANidine (ZANAFLEX) 4 MG tablet, Take 1 tablet (4 mg total) by mouth every 8 (eight) hours as needed for muscle spasms., Disp: 90 tablet, Rfl: 1  Imaging Review    Lumbar MR wo contrast:  Results for orders placed in visit on 03/03/14  MR [place] W/O Cm   Narrative   CLINICAL DATA:  Bilateral leg pain   EXAM:  MRI LUMBAR SPINE WITHOUT CONTRAST   TECHNIQUE:   Multiplanar, multisequence MR imaging of the lumbar spine was  performed. No intravenous contrast was administered.   COMPARISON:  None.   FINDINGS:  The vertebral bodies of the lumbar spine are normal in size and  alignment. There is normal bone marrow signal demonstrated  throughout the vertebra. The intervertebral disc spaces are  well-maintained.   The spinal cord is normal in signal and contour. The cord terminates  normally at L1 . The nerve roots of the cauda equina and the filum  terminale are normal.   The visualized portions of the SI joints are unremarkable.   The imaged intra-abdominal contents are unremarkable.   T12-L1: No significant disc bulge. No evidence of neural foraminal  stenosis. No central canal stenosis.   L1-L2: No significant disc bulge. No evidence of neural foraminal  stenosis. No central canal stenosis.   L2-L3: No significant disc bulge. No evidence of neural foraminal  stenosis. No central canal stenosis.   L3-L4: Mild broad-based disc bulge. Mild bilateral facet  arthropathy. Mild spinal stenosis. No evidence of neural foraminal  stenosis.  L4-L5: Minimal broad-based disc bulge. Mild bilateral facet  arthropathy. No evidence of neural foraminal stenosis. No central  canal stenosis.   L5-S1: No significant disc bulge. No evidence of neural foraminal  stenosis. No central canal stenosis.   IMPRESSION:  1. At L3-4 there is a mild broad-based disc bulge with mild  bilateral facet arthropathy and mild spinal stenosis.  2. At L4-5 there is minimal broad-based disc bulge with mild  bilateral facet arthropathy.    Electronically Signed    By: Kathreen Devoid    On: 03/03/2014 14:35        Lumbar DG 2-3 views:  Results for orders placed in visit on 12/13/13  DG Lumbar Spine 2-3 Views   Narrative   CLINICAL DATA:  Back pain radiating to the right side.   EXAM:  LUMBAR SPINE - 2-3 VIEW   COMPARISON:  02/18/2010   FINDINGS:  No  fracture. No spondylolisthesis. There is straightening of the  normal lumbar lordosis. Mild loss of disc height at L3-L4 and L4-L5.  Remaining lumbar disc spaces are well preserved. Small endplate  osteophytes are noted at these levels.   There is calcification along a normal caliber abdominal aorta. Soft  tissues are otherwise unremarkable.   IMPRESSION:  No fracture or acute finding. Mild loss of disc height at L3-L4 and  L4-L5 increased from the prior study. Straightening of the normal  lumbar lordosis similar to the prior exam.    Electronically Signed    By: Lajean Manes M.D.    On: 12/13/2013 11:53         omplexity Note: Imaging results reviewed. Results shared with Mr. Spadafore, using Layman's terms.                         ROS  Cardiovascular History: Daily Aspirin intake, Heart surgery and Heart catheterization Pulmonary or Respiratory History: No reported pulmonary signs or symptoms such as wheezing and difficulty taking a deep full breath (Asthma), difficulty blowing air out (Emphysema), coughing up mucus (Bronchitis), persistent dry cough, or temporary stoppage of breathing during sleep Neurological History: No reported neurological signs or symptoms such as seizures, abnormal skin sensations, urinary and/or fecal incontinence, being born with an abnormal open spine and/or a tethered spinal cord Review of Past Neurological Studies: No results found for this or any previous visit. Psychological-Psychiatric History: No reported psychological or psychiatric signs or symptoms such as difficulty sleeping, anxiety, depression, delusions or hallucinations (schizophrenial), mood swings (bipolar disorders) or suicidal ideations or attempts Gastrointestinal History: No reported gastrointestinal signs or symptoms such as vomiting or evacuating blood, reflux, heartburn, alternating episodes of diarrhea and constipation, inflamed or scarred liver, or pancreas or irrregular and/or  infrequent bowel movements Genitourinary History: No reported renal or genitourinary signs or symptoms such as difficulty voiding or producing urine, peeing blood, non-functioning kidney, kidney stones, difficulty emptying the bladder, difficulty controlling the flow of urine, or chronic kidney disease Hematological History: No reported hematological signs or symptoms such as prolonged bleeding, low or poor functioning platelets, bruising or bleeding easily, hereditary bleeding problems, low energy levels due to low hemoglobin or being anemic Endocrine History: No reported endocrine signs or symptoms such as high or low blood sugar, rapid heart rate due to high thyroid levels, obesity or weight gain due to slow thyroid or thyroid disease Rheumatologic History: No reported rheumatological signs and symptoms such as fatigue, joint pain, tenderness, swelling, redness, heat, stiffness, decreased range of motion,  with or without associated rash Musculoskeletal History: Negative for myasthenia gravis, muscular dystrophy, multiple sclerosis or malignant hyperthermia Work History: Working full time  Allergies  Mr. Dall is allergic to penicillins.  Laboratory Chemistry  Inflammation Markers (CRP: Acute Phase) (ESR: Chronic Phase) Lab Results  Component Value Date   ESRSEDRATE 2 05/12/2017                 Renal Function Markers Lab Results  Component Value Date   BUN 27 (H) 02/12/2017   CREATININE 1.29 (H) 02/12/2017   GFRAA 71 02/12/2017   GFRNONAA 62 02/12/2017                 Hepatic Function Markers Lab Results  Component Value Date   AST 36 02/12/2017   ALT 47 (H) 02/12/2017   ALBUMIN 4.8 02/12/2017   ALKPHOS 59 02/12/2017                 Electrolytes Lab Results  Component Value Date   NA 142 02/12/2017   K 4.9 02/12/2017   CL 98 02/12/2017   CALCIUM 10.0 02/12/2017                 Neuropathy Markers No results found for: [id number]               Bone Pathology  Markers Lab Results  Component Value Date   ALKPHOS 59 02/12/2017   CALCIUM 10.0 02/12/2017                 Coagulation Parameters Lab Results  Component Value Date   PLT 176 02/12/2017                 Cardiovascular Markers Lab Results  Component Value Date   HGB 15.7 02/12/2017   HCT 43.9 02/12/2017                 Note: Lab results reviewed.  [place]  Drug: Mr. Dente  reports that he does not use drugs. Alcohol:  reports that he drinks alcohol. Tobacco:  reports that he has never smoked. He has never used smokeless tobacco. Medical:  has a past medical history of Hyperlipidemia and Hypertension. Family: family history includes Colon cancer in his sister; Diabetes in his father; Hyperlipidemia in his mother; Hypertension in his father; Thyroid cancer in his sister.  Past Surgical History:  Procedure Laterality Date  . APPENDECTOMY    . CORONARY ARTERY BYPASS GRAFT     x 3  . HERNIA REPAIR     Active Ambulatory Problems    Diagnosis Date Noted  . Abnormal blood sugar 05/10/2015  . Arteriosclerosis of coronary artery 05/10/2015  . ED (erectile dysfunction) of organic origin 05/10/2015  . Acid reflux 05/10/2015  . HLD (hyperlipidemia) 05/10/2015  . BP (high blood pressure) 05/10/2015  . Lumbar radiculopathy 05/10/2015  . Adiposity 05/10/2015  . Avitaminosis D 05/10/2015  . Hyperglycemia 05/30/2015  . Benign essential HTN 05/07/2015   Resolved Ambulatory Problems    Diagnosis Date Noted  . No Resolved Ambulatory Problems   Past Medical History:  Diagnosis Date  . Hyperlipidemia   . Hypertension    Constitutional Exam  General appearance: Well nourished, well developed, and well hydrated. In no apparent acute distress Vitals:   07/07/17 1123  BP: (!) 147/96  Pulse: 79  Resp: 16  Temp: 98.4 F (36.9 C)  TempSrc: Oral  SpO2: 98%  Weight: 183 lb (83 kg)  Height: 5' 11" (1.803 m)  BMI Assessment: Estimated body mass index is 25.52 kg/m as calculated  from the following:   Height as of this encounter: 5' 11" (1.803 m).   Weight as of this encounter: 183 lb (83 kg).  BMI interpretation table: BMI level Category Range association with higher incidence of chronic pain  <18 kg/m2 Underweight   18.5-24.9 kg/m2 Ideal body weight   25-29.9 kg/m2 Overweight Increased incidence by 20%  30-34.9 kg/m2 Obese (Class I) Increased incidence by 68%  35-39.9 kg/m2 Severe obesity (Class II) Increased incidence by 136%  >40 kg/m2 Extreme obesity (Class III) Increased incidence by 254%   BMI Readings from Last 4 Encounters:  07/07/17 25.52 kg/m  05/26/17 26.36 kg/m  05/12/17 26.19 kg/m  02/11/17 25.80 kg/m   Wt Readings from Last 4 Encounters:  07/07/17 183 lb (83 kg)  05/26/17 189 lb (85.7 kg)  05/12/17 187 lb 12.8 oz (85.2 kg)  02/11/17 185 lb (83.9 kg)  Psych/Mental status: Alert, oriented x 3 (person, place, & time)       Eyes: PERLA Respiratory: No evidence of acute respiratory distress  Cervical Spine Area Exam  Skin & Axial Inspection: No masses, redness, edema, swelling, or associated skin lesions Alignment: Symmetrical Functional ROM: Unrestricted ROM      Stability: No instability detected Muscle Tone/Strength: Functionally intact. No obvious neuro-muscular anomalies detected. Sensory (Neurological): Unimpaired Palpation: No palpable anomalies              Upper Extremity (UE) Exam    Side: Right upper extremity  Side: Left upper extremity  Skin & Extremity Inspection: Skin color, temperature, and hair growth are WNL. No peripheral edema or cyanosis. No masses, redness, swelling, asymmetry, or associated skin lesions. No contractures.  Skin & Extremity Inspection: Skin color, temperature, and hair growth are WNL. No peripheral edema or cyanosis. No masses, redness, swelling, asymmetry, or associated skin lesions. No contractures.  Functional ROM: Unrestricted ROM          Functional ROM: Unrestricted ROM          Muscle  Tone/Strength: Functionally intact. No obvious neuro-muscular anomalies detected.  Muscle Tone/Strength: Functionally intact. No obvious neuro-muscular anomalies detected.  Sensory (Neurological): Unimpaired          Sensory (Neurological): Unimpaired          Palpation: No palpable anomalies              Palpation: No palpable anomalies              Specialized Test(s): Deferred         Specialized Test(s): Deferred          Thoracic Spine Area Exam  Skin & Axial Inspection: No masses, redness, or swelling Alignment: Symmetrical Functional ROM: Unrestricted ROM Stability: No instability detected Muscle Tone/Strength: Functionally intact. No obvious neuro-muscular anomalies detected. Sensory (Neurological): Unimpaired Muscle strength & Tone: No palpable anomalies  Lumbar Spine Area Exam  Skin & Axial Inspection: No masses, redness, or swelling Alignment: Symmetrical Functional ROM: Unrestricted ROM      Stability: No instability detected Muscle Tone/Strength: Functionally intact. No obvious neuro-muscular anomalies detected. Sensory (Neurological): Unimpaired Palpation: No palpable anomalies       Provocative Tests: Lumbar Hyperextension and rotation test: Negative       Lumbar Lateral bending test: Negative       Patrick's Maneuver: Negative                    Gait & Posture Assessment  Ambulation: Unassisted Gait: Relatively normal for age and body habitus Posture: WNL   Lower Extremity Exam    Side: Right lower extremity  Side: Left lower extremity  Skin & Extremity Inspection: Skin color, temperature, and hair growth are WNL. No peripheral edema or cyanosis. No masses, redness, swelling, asymmetry, or associated skin lesions. No contractures.  Skin & Extremity Inspection: Skin color, temperature, and hair growth are WNL. No peripheral edema or cyanosis. No masses, redness, swelling, asymmetry, or associated skin lesions. No contractures.  Functional ROM: Unrestricted ROM           Functional ROM: Unrestricted ROM          Muscle Tone/Strength: Functionally intact. No obvious neuro-muscular anomalies detected.  Muscle Tone/Strength: Functionally intact. No obvious neuro-muscular anomalies detected.  Sensory (Neurological): Unimpaired  Sensory (Neurological): Unimpaired  Palpation: No palpable anomalies  Palpation: No palpable anomalies   Assessment  Primary Diagnosis & Pertinent Problem List: The primary encounter diagnosis was Lumbar degenerative disc disease. Diagnoses of Lumbar facet arthropathy, Piriformis muscle pain, and Chronic pain syndrome were also pertinent to this visit.  Visit Diagnosis (New problems to examiner): 1. Lumbar degenerative disc disease   2. Lumbar facet arthropathy   3. Piriformis muscle pain   4. Chronic pain syndrome    Plan of Care (Initial workup plan)  Note: Please be advised that as per protocol, today's visit has been an evaluation only. We have not taken over the patient's controlled substance management.  56 year old male with axial low back pain that radiates into bilateral buttocks and posterior thighs, onset and duration approximately 3 years. Patient's lumbar MRI is significant for lumbar spondylosis, facet arthropathy and L3-L4 disc bulge. Patient has had 2 diagnostic lumbar medial branch nerve blocks which were not effective. He has undergone physical therapy and did not obtain any significant benefit. On physical exam patient's straight leg raise test was negative, Patrick's was negative, lumbar facet loading and lateral rotation was negative as well.This could potentially suggest a myofascial component to his pain. With his primary pain location being his buttock region, we discussed piriformis syndrome. I reviewed with him various exercises that he could do for piriformis release.We can consider doing a piriformis injection in the future if his symptoms do not improve.  Plan: - Urine drug screen today -Tizanidine 4 mg 3 times  a day when necessary for muscle spasms -Exercises provided for piriformis muscle release  -follow up in 1 month, can consider piriformis muscle injection at that time.   Ordered Lab-work, Procedure(s), Referral(s), & Consult(s): Orders Placed This Encounter  Procedures  . Compliance Drug Analysis, Ur   Pharmacotherapy (current): Medications ordered:  Meds ordered this encounter  Medications  . DISCONTD: tiZANidine (ZANAFLEX) 4 MG tablet    Sig: Take 1 tablet (4 mg total) by mouth every 8 (eight) hours as needed for muscle spasms.    Dispense:  90 tablet    Refill:  1  . DISCONTD: tiZANidine (ZANAFLEX) 4 MG tablet    Sig: Take 1 tablet (4 mg total) by mouth every 8 (eight) hours as needed for muscle spasms.    Dispense:  90 tablet    Refill:  1  . tiZANidine (ZANAFLEX) 4 MG tablet    Sig: Take 1 tablet (4 mg total) by mouth every 8 (eight) hours as needed for muscle spasms.    Dispense:  90 tablet    Refill:  1   Medications administered during this visit: Mr. Harmening had no medications administered  during this visit.   Pharmacological management options:  Opioid Analgesics: we'll avoid  Membrane stabilizer: as tried gabapentin in the past was not effective.  Muscle relaxant: has tried Flexeril. Not effective. We'll trial tizanidine.  NSAID: To be determined at a later time  Other analgesic(s): To be determined at a later time   Interventional management options: Mr. Deiss was informed that there is no guarantee that he would be a candidate for interventional therapies. The decision will be based on the results of diagnostic studies, as well as Mr. Mesta risk profile.  Procedure(s) under consideration:  -piriformis injection.   Provider-requested follow-up: Return in about 4 weeks (around 08/04/2017) for Medication Management.  Future Appointments Date Time Provider [place]  08/06/2017 10:30 AM Gillis Santa, MD [facility] None  11/24/2017 8:30 AM Shawn Crawford., MD [facility] None    Primary Care Physician: Shawn Crawford., MD Location: [facility] Note by: Gillis Santa, M.D, Date: 07/07/2017; Time: 3:36 PM  Patient Instructions  1. Urine drug screen today. 2. Tizanidine 4 mg up to 3 times a day as needed for muscle spasms. 3. Piriform stretching exercises with tennis ball. You can google/ youtube various exercises that you can do. I have also provided you with handout.

## 2017-07-07 NOTE — Patient Instructions (Signed)
1. Urine drug screen today. 2. Tizanidine 4 mg up to 3 times a day as needed for muscle spasms. 3. Piriform stretching exercises with tennis ball. You can google/ youtube various exercises that you can do. I have also provided you with handout.

## 2017-07-08 ENCOUNTER — Encounter: Payer: Self-pay | Admitting: Physician Assistant

## 2017-07-08 ENCOUNTER — Ambulatory Visit (INDEPENDENT_AMBULATORY_CARE_PROVIDER_SITE_OTHER): Payer: 59 | Admitting: Physician Assistant

## 2017-07-08 ENCOUNTER — Ambulatory Visit
Admission: RE | Admit: 2017-07-08 | Discharge: 2017-07-08 | Disposition: A | Discharge status: Home / Self Care | Payer: 59 | Source: Ambulatory Visit | Attending: Physician Assistant | Admitting: Physician Assistant

## 2017-07-08 VITALS — BP 118/82 | HR 84 | Temp 97.9°F | Resp 16 | Wt 185.0 lb

## 2017-07-08 DIAGNOSIS — M25474 Effusion, right foot: Secondary | ICD-10-CM

## 2017-07-08 DIAGNOSIS — L539 Erythematous condition, unspecified: Secondary | ICD-10-CM | POA: Diagnosis not present

## 2017-07-08 DIAGNOSIS — M79671 Pain in right foot: Secondary | ICD-10-CM | POA: Diagnosis not present

## 2017-07-08 DIAGNOSIS — M7989 Other specified soft tissue disorders: Secondary | ICD-10-CM | POA: Insufficient documentation

## 2017-07-08 LAB — CBC WITH DIFFERENTIAL/PLATELET
Basophils Absolute: 28 cells/uL (ref 0–200)
Basophils Relative: 0.4 %
Eosinophils Absolute: 128 cells/uL (ref 15–500)
Eosinophils Relative: 1.8 %
HCT: 43.1 % (ref 38.5–50.0)
Hemoglobin: 15.2 g/dL (ref 13.2–17.1)
Lymphs Abs: 1150 cells/uL (ref 850–3900)
MCH: 30.5 pg (ref 27.0–33.0)
MCHC: 35.3 g/dL (ref 32.0–36.0)
MCV: 86.4 fL (ref 80.0–100.0)
MPV: 10.5 fL (ref 7.5–12.5)
Monocytes Relative: 9.3 %
Neutro Abs: 5133 cells/uL (ref 1500–7800)
Neutrophils Relative %: 72.3 %
Platelets: 201 10*3/uL (ref 140–400)
RBC: 4.99 10*6/uL (ref 4.20–5.80)
RDW: 12.1 % (ref 11.0–15.0)
Total Lymphocyte: 16.2 %
WBC mixed population: 660 cells/uL (ref 200–950)
WBC: 7.1 10*3/uL (ref 3.8–10.8)

## 2017-07-08 LAB — URIC ACID: Uric Acid, Serum: 7.5 mg/dL (ref 4.0–8.0)

## 2017-07-08 NOTE — Patient Instructions (Signed)

## 2017-07-08 NOTE — Progress Notes (Signed)
Patient: Shawn Crawford Male    DOB: 19-Jul-1961   56 y.o.   MRN: [id number] Visit Date: 07/08/2017  Today's Provider: Trinna Post, PA-C   Chief Complaint  Patient presents with  . Toe Pain    Right great toe, started about two days ago.   Subjective:    Shawn Crawford is a 57 y/o man presenting today with right foot redness and pain. He says the pain is present in his right MTP. It did not start suddenly, but rather slowly over course of a couple of days. There is some slight redness. Denies injuries. He endorses mild pain. He is not on diuretics, has normal kidney function. He is going on a trip Saturday and is concerned about gout.   Toe Pain   The incident occurred 2 days ago. There was no injury mechanism. The pain is present in the right foot. The quality of the pain is described as aching. The pain is at a severity of 2/10. The pain is mild. The pain has been worsening since onset. Pertinent negatives include no inability to bear weight, loss of motion, loss of sensation, muscle weakness or tingling. He reports no foreign bodies present. The symptoms are aggravated by weight bearing. He has tried NSAIDs for the symptoms. The treatment provided mild relief.       Allergies  Allergen Reactions  . Penicillins      Current Outpatient Prescriptions:  .  aspirin 325 MG tablet, Take 81 mg by mouth daily. , Disp: , Rfl:  .  COENZYME Q-10 PO, Take by mouth., Disp: , Rfl:  .  fexofenadine (ALLEGRA) 180 MG tablet, Take by mouth., Disp: , Rfl:  .  metoprolol tartrate (LOPRESSOR) 25 MG tablet, Take by mouth 2 (two) times daily. , Disp: , Rfl:  .  MULTIPLE VITAMINS PO, Take by mouth., Disp: , Rfl:  .  Omega-3 Fatty Acids (FISH OIL) 1200 MG CAPS, Take by mouth., Disp: , Rfl:  .  rosuvastatin (CRESTOR) 40 MG tablet, Take 1 tablet (40 mg total) by mouth daily., Disp: 90 tablet, Rfl: 3 .  tiZANidine (ZANAFLEX) 4 MG tablet, Take 1 tablet (4 mg total) by mouth every 8 (eight)  hours as needed for muscle spasms., Disp: 90 tablet, Rfl: 1 .  zolpidem (AMBIEN) 10 MG tablet, Take 1 tablet (10 mg total) by mouth at bedtime as needed for sleep., Disp: 30 tablet, Rfl: 5  Review of Systems  Constitutional: Negative.   Musculoskeletal: Positive for arthralgias. Negative for back pain, gait problem, joint swelling, myalgias, neck pain and neck stiffness.  Neurological: Negative for tingling.    Social History  Substance Use Topics  . Smoking status: Never Smoker  . Smokeless tobacco: Never Used  . Alcohol use 0.0 oz/week     Comment: glass of wine occasionally   Objective:   BP 118/82 (BP Location: Right Arm, Patient Position: Sitting, Cuff Size: Normal)   Pulse 84   Temp 97.9 F (36.6 C) (Oral)   Resp 16   Wt 185 lb (83.9 kg)   BMI 25.80 kg/m  Vitals:   07/08/17 0950  BP: 118/82  Pulse: 84  Resp: 16  Temp: 97.9 F (36.6 C)  TempSrc: Oral  Weight: 185 lb (83.9 kg)     Physical Exam  Constitutional: He is oriented to person, place, and time. He appears well-developed and well-nourished.  Cardiovascular: Normal rate.   Pulses:      Dorsalis pedis  pulses are 2+ on the right side, and 2+ on the left side.       Posterior tibial pulses are 2+ on the right side, and 2+ on the left side.  Pulmonary/Chest: Effort normal.  Musculoskeletal: Normal range of motion. He exhibits no edema, tenderness or deformity.  Neurological: He is alert and oriented to person, place, and time.  Skin: Skin is warm and dry. There is erythema.  Very slight redness around right MTP. No warmth, swelling, or tenderness to palpation.  Psychiatric: He has a normal mood and affect. His behavior is normal.        Assessment & Plan:     1. Swelling of first metatarsophalangeal (MTP) joint of right foot  Low suspicion for gout due to gradual onset, minimal swelling and pain, low risk factors. Will evaluate as below for gout and infection. May be just transient irritation. Reviewed  XRAy. Some bone spurring along 1st MTP. Will call patient with results and check in on Thurs/Friday. Will send in medication as appropriate in case he may need it.   - CBC with Differential - Uric acid - DG Foot Complete Right; Future  2. Redness of skin  - CBC with Differential  Return if symptoms worsen or fail to improve.  The entirety of the information documented in the History of Present Illness, Review of Systems and Physical Exam were personally obtained by me. Portions of this information were initially documented by Ashley Royalty, CMA and reviewed by me for thoroughness and accuracy.    Trinna Post, PA-C  [facility]

## 2017-07-09 ENCOUNTER — Telehealth: Payer: Self-pay

## 2017-07-09 DIAGNOSIS — R609 Edema, unspecified: Secondary | ICD-10-CM

## 2017-07-09 DIAGNOSIS — R238 Other skin changes: Secondary | ICD-10-CM

## 2017-07-09 MED ORDER — PREDNISONE 20 MG PO TABS: 20.0000 mg | ORAL_TABLET | Freq: Two times a day (BID) | ORAL | 0 refills | Status: AC

## 2017-07-09 NOTE — Telephone Encounter (Signed)
Patient advised of results and verbally voiced understanding. Patient states he feels about the same as he did when he was last seen in the office. Patient uses [facility] on [address].

## 2017-07-09 NOTE — Telephone Encounter (Signed)
Patient advised and verbally voiced understanding.  

## 2017-07-09 NOTE — Telephone Encounter (Signed)
Sent in prednisone 20 mg BID x 5 days for possible gout. Low suspicion but patient may take if he feels it might help before his trip. Take with food, can cause a bit of insomnia. If this is issue with split dose, can try taking two tabs in the morning together.

## 2017-07-09 NOTE — Telephone Encounter (Signed)
-----   Message from Trinna Post, [place] sent at 07/09/2017  8:29 AM EDT ----- White count normal, no signs of infection. Uric acid normal. Do think that pain/irritation is due to bone spurring and rubbing on shoe. Would try the wide sole orthotics. Very low suspicion for gout and infection, but patient can call us tomorrow to check in before his trip.

## 2017-08-04 LAB — COMPLIANCE DRUG ANALYSIS, UR

## 2017-08-06 ENCOUNTER — Ambulatory Visit (INDEPENDENT_AMBULATORY_CARE_PROVIDER_SITE_OTHER): Payer: 59 | Admitting: Physician Assistant

## 2017-08-06 ENCOUNTER — Ambulatory Visit
Payer: 59 | Attending: Student in an Organized Health Care Education/Training Program | Admitting: Student in an Organized Health Care Education/Training Program

## 2017-08-06 ENCOUNTER — Encounter: Payer: Self-pay | Admitting: Physician Assistant

## 2017-08-06 ENCOUNTER — Encounter: Payer: Self-pay | Admitting: Student in an Organized Health Care Education/Training Program

## 2017-08-06 VITALS — BP 134/88 | HR 92 | Temp 98.9°F | Resp 16 | Wt 186.0 lb

## 2017-08-06 VITALS — BP 149/90 | HR 85 | Temp 98.5°F | Resp 16 | Ht 71.0 in | Wt 185.0 lb

## 2017-08-06 DIAGNOSIS — J069 Acute upper respiratory infection, unspecified: Secondary | ICD-10-CM

## 2017-08-06 DIAGNOSIS — M545 Low back pain: Secondary | ICD-10-CM | POA: Diagnosis present

## 2017-08-06 DIAGNOSIS — Z79899 Other long term (current) drug therapy: Secondary | ICD-10-CM | POA: Diagnosis not present

## 2017-08-06 DIAGNOSIS — K219 Gastro-esophageal reflux disease without esophagitis: Secondary | ICD-10-CM | POA: Insufficient documentation

## 2017-08-06 DIAGNOSIS — I2510 Atherosclerotic heart disease of native coronary artery without angina pectoris: Secondary | ICD-10-CM | POA: Insufficient documentation

## 2017-08-06 DIAGNOSIS — Z951 Presence of aortocoronary bypass graft: Secondary | ICD-10-CM | POA: Diagnosis not present

## 2017-08-06 DIAGNOSIS — M5116 Intervertebral disc disorders with radiculopathy, lumbar region: Secondary | ICD-10-CM | POA: Insufficient documentation

## 2017-08-06 DIAGNOSIS — M47816 Spondylosis without myelopathy or radiculopathy, lumbar region: Secondary | ICD-10-CM | POA: Diagnosis not present

## 2017-08-06 DIAGNOSIS — G894 Chronic pain syndrome: Secondary | ICD-10-CM | POA: Insufficient documentation

## 2017-08-06 DIAGNOSIS — R7309 Other abnormal glucose: Secondary | ICD-10-CM | POA: Insufficient documentation

## 2017-08-06 DIAGNOSIS — E785 Hyperlipidemia, unspecified: Secondary | ICD-10-CM | POA: Insufficient documentation

## 2017-08-06 DIAGNOSIS — F329 Major depressive disorder, single episode, unspecified: Secondary | ICD-10-CM | POA: Insufficient documentation

## 2017-08-06 DIAGNOSIS — E669 Obesity, unspecified: Secondary | ICD-10-CM | POA: Diagnosis not present

## 2017-08-06 DIAGNOSIS — M7918 Myalgia, other site: Secondary | ICD-10-CM | POA: Diagnosis not present

## 2017-08-06 DIAGNOSIS — Z6825 Body mass index (BMI) 25.0-25.9, adult: Secondary | ICD-10-CM | POA: Insufficient documentation

## 2017-08-06 DIAGNOSIS — M5136 Other intervertebral disc degeneration, lumbar region: Secondary | ICD-10-CM

## 2017-08-06 DIAGNOSIS — E559 Vitamin D deficiency, unspecified: Secondary | ICD-10-CM | POA: Diagnosis not present

## 2017-08-06 DIAGNOSIS — I10 Essential (primary) hypertension: Secondary | ICD-10-CM | POA: Insufficient documentation

## 2017-08-06 DIAGNOSIS — B9789 Other viral agents as the cause of diseases classified elsewhere: Secondary | ICD-10-CM

## 2017-08-06 DIAGNOSIS — Z7982 Long term (current) use of aspirin: Secondary | ICD-10-CM | POA: Diagnosis not present

## 2017-08-06 DIAGNOSIS — N529 Male erectile dysfunction, unspecified: Secondary | ICD-10-CM | POA: Insufficient documentation

## 2017-08-06 DIAGNOSIS — Z808 Family history of malignant neoplasm of other organs or systems: Secondary | ICD-10-CM | POA: Diagnosis not present

## 2017-08-06 DIAGNOSIS — M791 Myalgia, unspecified site: Secondary | ICD-10-CM | POA: Diagnosis not present

## 2017-08-06 MED ORDER — TAPENTADOL HCL 50 MG PO TABS: 50.0000 mg | ORAL_TABLET | Freq: Three times a day (TID) | ORAL | 0 refills | Status: DC | PRN

## 2017-08-06 NOTE — Progress Notes (Signed)
Patient's Name: Shawn Crawford  MRN: [id number]  Referring Provider: Jerrol Banana.,*  DOB: [birth year]  PCP: Jerrol Banana., MD  DOS: 08/06/2017  Note by: Gillis Santa, MD  Service setting: Ambulatory outpatient  Specialty: Interventional Pain Management  Location: [facility] (AMB) Pain Management Facility    Patient type: Established   Primary Reason(s) for Visit: Encounter for prescription drug management. (Level of risk: moderate)  CC: Back Pain (lower)  HPI  Shawn Crawford is a 56 y.o. year old, male patient, who comes today for a medication management evaluation. He has Abnormal blood sugar; Arteriosclerosis of coronary artery; ED (erectile dysfunction) of organic origin; Acid reflux; HLD (hyperlipidemia); BP (high blood pressure); Lumbar radiculopathy; Adiposity; Avitaminosis D; Hyperglycemia; and Benign essential HTN on their problem list. His primarily concern today is the Back Pain (lower)  Pain Assessment: Location: Lower Back Radiating: bilateral groin, both upper legs, buttocks Onset: More than a month ago Duration: Chronic pain Quality: Aching Severity: 2 /10 (self-reported pain score)  Note: Reported level is compatible with observation.                         When using our objective Pain Scale, levels between 6 and 10/10 are said to belong in an emergency room, as it progressively worsens from a 6/10, described as severely limiting, requiring emergency care not usually available at an outpatient pain management facility. At a 6/10 level, communication becomes difficult and requires great effort. Assistance to reach the emergency department may be required. Facial flushing and profuse sweating along with potentially dangerous increases in heart rate and blood pressure will be evident. Effect on ADL:   Timing: Constant Modifying factors: lying on side, TENS, heating pad  Shawn Crawford was last scheduled for an appointment on 57/05/2017 for medication management. During today's  appointment we reviewed Shawn Crawford chronic pain status, as well as his outpatient medication regimen.  The patient  reports that he does not use drugs. His body mass index is 25.8 kg/m.  Further details on both, my assessment(s), as well as the proposed treatment plan, please see below.  Controlled Substance Pharmacotherapy Assessment REMS (Risk Evaluation and Mitigation Strategy)  Analgesic: Tapentadol 50 mg 3 times daily as needed moderate to chronic pain MME/day: Less than 20 mg/day.  Landis Martins, RN  08/06/2017 10:53 AM  Sign at close encounter Safety precautions to be maintained throughout the outpatient stay will include: orient to surroundings, keep bed in low position, maintain call bell within reach at all times, provide assistance with transfer out of bed and ambulation.    Pharmacokinetics: Liberation and absorption (onset of action): WNL Distribution (time to peak effect): WNL Metabolism and excretion (duration of action): WNL         Pharmacodynamics: Desired effects: Analgesia: Shawn Crawford reports >50% benefit. Functional ability: Patient reports that medication allows him to accomplish basic ADLs Clinically meaningful improvement in function (CMIF): Sustained CMIF goals met Perceived effectiveness: Described as relatively effective, allowing for increase in activities of daily living (ADL) Undesirable effects: Side-effects or Adverse reactions: None reported Monitoring: [place] PMP: Online review of the past 65-monthperiod conducted. Compliant with practice rules and regulations Last UDS on record: Summary  Date Value Ref Range Status  07/07/2017 FINAL  Corrected    Comment:    ==================================================================== [facility] COMP DRUG ANALYSIS,UR ==================================================================== Test  Result       Flag       Units Drug Present and Declared for Prescription Verification    Zolpidem                       PRESENT      EXPECTED   Zolpidem Acid                  PRESENT      EXPECTED    Zolpidem acid is an expected metabolite of zolpidem.   Metoprolol                     PRESENT      EXPECTED Drug Present not Declared for Prescription Verification   Naproxen                       PRESENT      UNEXPECTED Drug Absent but Declared for Prescription Verification   Tizanidine                     Not Detected UNEXPECTED    Tizanidine, as indicated in the declared medication list, is not    always detected even when used as directed.   Salicylate                     Not Detected UNEXPECTED    Aspirin, as indicated in the declared medication list, is not    always detected even when used as directed. ==================================================================== Test                      Result    Flag   Units      Ref Range   Creatinine              73               mg/dL      >=20 ==================================================================== Declared Medications:  The flagging and interpretation on this report are based on the  following declared medications.  Unexpected results may arise from  inaccuracies in the declared medications.  **Note: The testing scope of this panel includes these medications:  Metoprolol (Lopressor)  **Note: The testing scope of this panel does not include small to  moderate amounts of these reported medications:  Aspirin  Tizanidine (Zanaflex)  Zolpidem (Ambien)  **Note: The testing scope of this panel does not include following  reported medications:  Fexofenadine (Allegra)  Omega-3 Fatty Acids (Fish Oil)  Rosuvastatin (Crestor)  Ubiquinone (CoQ10) ==================================================================== For clinical consultation, please call [phone]. ====================================================================    UDS interpretation: Compliant          Medication Assessment Form:  Reviewed. Patient indicates being compliant with therapy Treatment compliance: Compliant Risk Assessment Profile: Aberrant behavior: See prior evaluations. None observed or detected today Comorbid factors increasing risk of overdose: See prior notes. No additional risks detected today Risk of substance use disorder (SUD): Low Opioid Risk Tool - 07/07/17 1131      Family History of Substance Abuse   Alcohol  Negative    Illegal Drugs  Negative    Rx Drugs  Negative      Personal History of Substance Abuse   Alcohol  Negative    Illegal Drugs  Negative    Rx Drugs  Negative      Age   Age between 39-45 years   No  History of Preadolescent Sexual Abuse   History of Preadolescent Sexual Abuse  Negative or Male      Psychological Disease   Psychological Disease  Negative    Depression  Negative      Total Score   Opioid Risk Tool Scoring  0    Opioid Risk Interpretation  Low Risk      ORT Scoring interpretation table:  Score <3 = Low Risk for SUD  Score between 4-7 = Moderate Risk for SUD  Score >8 = High Risk for Opioid Abuse   Risk Mitigation Strategies:  Patient Counseling: Covered Patient-Prescriber Agreement (PPA): Present and active  Notification to other healthcare providers: Done  Pharmacologic Plan: Tapentadol 50 mill grams 3 times daily as needed moderate to chronic pain  Laboratory Chemistry  Inflammation Markers (CRP: Acute Phase) (ESR: Chronic Phase) Lab Results  Component Value Date   ESRSEDRATE 2 05/12/2017                 Renal Function Markers Lab Results  Component Value Date   BUN 27 (H) 02/12/2017   CREATININE 1.29 (H) 02/12/2017   GFRAA 71 02/12/2017   GFRNONAA 62 02/12/2017                 Hepatic Function Markers Lab Results  Component Value Date   AST 36 02/12/2017   ALT 47 (H) 02/12/2017   ALBUMIN 4.8 02/12/2017   ALKPHOS 59 02/12/2017                 Electrolytes Lab Results  Component Value Date   NA 142 02/12/2017    K 4.9 02/12/2017   CL 98 02/12/2017   CALCIUM 10.0 02/12/2017                 Neuropathy Markers No results found for: [id number]               Bone Pathology Markers Lab Results  Component Value Date   ALKPHOS 59 02/12/2017   CALCIUM 10.0 02/12/2017                 Rheumatology Markers Lab Results  Component Value Date   LABURIC 7.5 07/08/2017                Coagulation Parameters Lab Results  Component Value Date   PLT 201 07/08/2017                 Cardiovascular Markers Lab Results  Component Value Date   HGB 15.2 07/08/2017   HCT 43.1 07/08/2017                 CA Markers No results found for: CEA, CA125               Note: Lab results reviewed.  Recent Diagnostic Imaging Results  DG Foot Complete Right CLINICAL DATA:  Pain in the great toe for 48 hours. Swelling of the first MTP joint. Concern for gout.  EXAM: RIGHT FOOT COMPLETE - 3+ VIEW  COMPARISON:  None.  FINDINGS: There is no evidence of fracture or dislocation. Minimal spurring along the lateral aspect of the first MTP joint. Otherwise, no significant arthropathy or degenerative disease. Soft tissues are unremarkable. Enthesopathic changes at the insertion of the Achilles tendon.  IMPRESSION: No acute bone abnormality.  No significant arthropathy at the first MTP joint.  Electronically Signed   By: Markus Daft M.D.   On: 07/08/2017 16:03  Complexity Note: Imaging results reviewed.  Results shared with Mr. Macomber, using Layman's terms.                         Meds   Current Outpatient Medications:  .  aspirin 325 MG tablet, Take 81 mg by mouth daily. , Disp: , Rfl:  .  COENZYME Q-10 PO, Take by mouth., Disp: , Rfl:  .  fexofenadine (ALLEGRA) 180 MG tablet, Take by mouth., Disp: , Rfl:  .  metoprolol tartrate (LOPRESSOR) 25 MG tablet, Take by mouth 2 (two) times daily. , Disp: , Rfl:  .  MULTIPLE VITAMINS PO, Take by mouth., Disp: , Rfl:  .  Omega-3 Fatty Acids (FISH OIL) 1200 MG  CAPS, Take by mouth., Disp: , Rfl:  .  rosuvastatin (CRESTOR) 40 MG tablet, Take 1 tablet (40 mg total) by mouth daily., Disp: 90 tablet, Rfl: 3 .  tiZANidine (ZANAFLEX) 4 MG tablet, Take 1 tablet (4 mg total) by mouth every 8 (eight) hours as needed for muscle spasms., Disp: 90 tablet, Rfl: 1 .  tapentadol (NUCYNTA) 50 MG tablet, Take 1 tablet (50 mg total) every 8 (eight) hours as needed by mouth for severe pain., Disp: 90 tablet, Rfl: 0 .  zolpidem (AMBIEN) 10 MG tablet, Take 1 tablet (10 mg total) by mouth at bedtime as needed for sleep., Disp: 30 tablet, Rfl: 5  ROS  Constitutional: Denies any fever or chills Gastrointestinal: No reported hemesis, hematochezia, vomiting, or acute GI distress Musculoskeletal: Denies any acute onset joint swelling, redness, loss of ROM, or weakness Neurological: No reported episodes of acute onset apraxia, aphasia, dysarthria, agnosia, amnesia, paralysis, loss of coordination, or loss of consciousness  Allergies  Mr. Gavia is allergic to penicillins.  [place]  Drug: Mr. Chappuis  reports that he does not use drugs. Alcohol:  reports that he drinks alcohol. Tobacco:  reports that  has never smoked. he has never used smokeless tobacco. Medical:  has a past medical history of Hyperlipidemia and Hypertension. Surgical: Mr. Betke  has a past surgical history that includes Coronary artery bypass graft; Hernia repair; and Appendectomy. Family: family history includes Colon cancer in his sister; Diabetes in his father; Hyperlipidemia in his mother; Hypertension in his father; Thyroid cancer in his sister.  Constitutional Exam  General appearance: Well nourished, well developed, and well hydrated. In no apparent acute distress Vitals:   08/06/17 1047  BP: (!) 149/90  Pulse: 85  Resp: 16  Temp: 98.5 F (36.9 C)  TempSrc: Oral  SpO2: 100%  Weight: 185 lb (83.9 kg)  Height: 5' 11" (1.803 m)   BMI Assessment: Estimated body mass index is 25.8 kg/m as calculated  from the following:   Height as of this encounter: 5' 11" (1.803 m).   Weight as of this encounter: 185 lb (83.9 kg).  BMI interpretation table: BMI level Category Range association with higher incidence of chronic pain  <18 kg/m2 Underweight   18.5-24.9 kg/m2 Ideal body weight   25-29.9 kg/m2 Overweight Increased incidence by 20%  30-34.9 kg/m2 Obese (Class I) Increased incidence by 68%  35-39.9 kg/m2 Severe obesity (Class II) Increased incidence by 136%  >40 kg/m2 Extreme obesity (Class III) Increased incidence by 254%   BMI Readings from Last 4 Encounters:  08/06/17 25.94 kg/m  08/06/17 25.80 kg/m  07/08/17 25.80 kg/m  07/07/17 25.52 kg/m   Wt Readings from Last 4 Encounters:  08/06/17 186 lb (84.4 kg)  08/06/17 185 lb (83.9 kg)  07/08/17 185 lb (  83.9 kg)  07/07/17 183 lb (83 kg)  Psych/Mental status: Alert, oriented x 3 (person, place, & time)       Eyes: PERLA Respiratory: No evidence of acute respiratory distress  Cervical Spine Area Exam  Skin & Axial Inspection: No masses, redness, edema, swelling, or associated skin lesions Alignment: Symmetrical Functional ROM: Unrestricted ROM      Stability: No instability detected Muscle Tone/Strength: Functionally intact. No obvious neuro-muscular anomalies detected. Sensory (Neurological): Unimpaired Palpation: No palpable anomalies              Upper Extremity (UE) Exam    Side: Right upper extremity  Side: Left upper extremity  Skin & Extremity Inspection: Skin color, temperature, and hair growth are WNL. No peripheral edema or cyanosis. No masses, redness, swelling, asymmetry, or associated skin lesions. No contractures.  Skin & Extremity Inspection: Skin color, temperature, and hair growth are WNL. No peripheral edema or cyanosis. No masses, redness, swelling, asymmetry, or associated skin lesions. No contractures.  Functional ROM: Unrestricted ROM          Functional ROM: Unrestricted ROM          Muscle Tone/Strength:  Functionally intact. No obvious neuro-muscular anomalies detected.  Muscle Tone/Strength: Functionally intact. No obvious neuro-muscular anomalies detected.  Sensory (Neurological): Unimpaired          Sensory (Neurological): Unimpaired          Palpation: No palpable anomalies              Palpation: No palpable anomalies              Specialized Test(s): Deferred         Specialized Test(s): Deferred          Thoracic Spine Area Exam  Skin & Axial Inspection: No masses, redness, or swelling Alignment: Symmetrical Functional ROM: Unrestricted ROM Stability: No instability detected Muscle Tone/Strength: Functionally intact. No obvious neuro-muscular anomalies detected. Sensory (Neurological): Unimpaired Muscle strength & Tone: No palpable anomalies  Lumbar Spine Area Exam  Skin & Axial Inspection: No masses, redness, or swelling Alignment: Symmetrical Functional ROM: Unrestricted ROM      Stability: No instability detected Muscle Tone/Strength: Functionally intact. No obvious neuro-muscular anomalies detected. Sensory (Neurological): Unimpaired Palpation: No palpable anomalies       Provocative Tests: Lumbar Hyperextension and rotation test: evaluation deferred today       Lumbar Lateral bending test: evaluation deferred today       Patrick's Maneuver: evaluation deferred today                    Gait & Posture Assessment  Ambulation: Unassisted Gait: Relatively normal for age and body habitus Posture: WNL   Lower Extremity Exam    Side: Right lower extremity  Side: Left lower extremity  Skin & Extremity Inspection: Skin color, temperature, and hair growth are WNL. No peripheral edema or cyanosis. No masses, redness, swelling, asymmetry, or associated skin lesions. No contractures.  Skin & Extremity Inspection: Skin color, temperature, and hair growth are WNL. No peripheral edema or cyanosis. No masses, redness, swelling, asymmetry, or associated skin lesions. No contractures.   Functional ROM: Unrestricted ROM          Functional ROM: Unrestricted ROM          Muscle Tone/Strength: Functionally intact. No obvious neuro-muscular anomalies detected.  Muscle Tone/Strength: Functionally intact. No obvious neuro-muscular anomalies detected.  Sensory (Neurological): Unimpaired  Sensory (Neurological): Unimpaired  Palpation: No palpable anomalies  Palpation: No palpable anomalies   Assessment  Primary Diagnosis & Pertinent Problem List: The primary encounter diagnosis was Lumbar degenerative disc disease. Diagnoses of Lumbar facet arthropathy, Piriformis muscle pain, and Chronic pain syndrome were also pertinent to this visit.  Status Diagnosis  Stable Stable Stable 1. Lumbar degenerative disc disease   2. Lumbar facet arthropathy   3. Piriformis muscle pain   4. Chronic pain syndrome      56 year old male with axial low back pain that radiates into bilateral buttocks and posterior thighs, onset and duration approximately 3 years. Patient's lumbar MRI is significant for lumbar spondylosis, facet arthropathy and L3-L4 disc bulge. Patient has had 2 diagnostic lumbar medial branch nerve blocks which were not effective. He has undergone physical therapy and did not obtain any significant benefit. On physical exam patient's straight leg raise test was negative, Patrick's was negative, lumbar facet loading and lateral rotation was negative as well.This could potentially suggest a myofascial component to his pain.   Patient has tried various piriformis muscle exercises along with muscle relaxants including tizanidine which were only moderately effective.  He does have episodes of severe sharp intermittent pain.  We discussed the therapeutic benefits of tapentadol which has both norepinephrine and mu modulating properties and patient would like to trial this medication.  Plan: -Continue with tizanidine 4 mg 3 times daily as needed -Sign opioid agreement -Start tapentadol 50 mg  3 times daily as needed, prescription for 1 month.  UDS appropriate, PMP appropriate.  Pharmacotherapy (Medications Ordered): Meds ordered this encounter  Medications  . tapentadol (NUCYNTA) 50 MG tablet    Sig: Take 1 tablet (50 mg total) every 8 (eight) hours as needed by mouth for severe pain.    Dispense:  90 tablet    Refill:  0     Provider-requested follow-up: Return in about 3 weeks (around 08/27/2017) for Medication Management.  Future Appointments  Date Time Provider [place]  08/27/2017 12:00 PM Gillis Santa, MD [facility] None  11/24/2017  8:30 AM Jerrol Banana., MD [facility] None    Primary Care Physician: Jerrol Banana., MD Location: [facility] Note by: Gillis Santa, M.D Date: 08/06/2017; Time: 3:31 PM  Patient Instructions  You were given 1 prescription for Nucynta today.

## 2017-08-06 NOTE — Progress Notes (Signed)
Safety precautions to be maintained throughout the outpatient stay will include: orient to surroundings, keep bed in low position, maintain call bell within reach at all times, provide assistance with transfer out of bed and ambulation.  

## 2017-08-06 NOTE — Progress Notes (Signed)
Patient: Shawn Crawford Male    DOB: Nov 22, 1960   56 y.o.   MRN: [id number] Visit Date: 08/06/2017  Today's Provider: Trinna Post, PA-C   Chief Complaint  Patient presents with  . URI    Symptoms started about four days ago.   Subjective:    URI   This is a new problem. The current episode started in the past 7 days. The problem has been gradually worsening. There has been no fever. Associated symptoms include congestion, coughing, rhinorrhea and wheezing. Pertinent negatives include no abdominal pain, ear pain, headaches, neck pain, plugged ear sensation, sinus pain, sneezing, sore throat or swollen glands. Treatments tried: Advil cold and sinus. The treatment provided mild relief.    Pt report having a history of pneumonia. He recently "choked" on water and it worried he may have aspirated.         Allergies  Allergen Reactions  . Penicillins      Current Outpatient Medications:  .  aspirin 325 MG tablet, Take 81 mg by mouth daily. , Disp: , Rfl:  .  COENZYME Q-10 PO, Take by mouth., Disp: , Rfl:  .  fexofenadine (ALLEGRA) 180 MG tablet, Take by mouth., Disp: , Rfl:  .  metoprolol tartrate (LOPRESSOR) 25 MG tablet, Take by mouth 2 (two) times daily. , Disp: , Rfl:  .  MULTIPLE VITAMINS PO, Take by mouth., Disp: , Rfl:  .  Omega-3 Fatty Acids (FISH OIL) 1200 MG CAPS, Take by mouth., Disp: , Rfl:  .  rosuvastatin (CRESTOR) 40 MG tablet, Take 1 tablet (40 mg total) by mouth daily., Disp: 90 tablet, Rfl: 3 .  tapentadol (NUCYNTA) 50 MG tablet, Take 1 tablet (50 mg total) every 8 (eight) hours as needed by mouth for severe pain., Disp: 90 tablet, Rfl: 0 .  tiZANidine (ZANAFLEX) 4 MG tablet, Take 1 tablet (4 mg total) by mouth every 8 (eight) hours as needed for muscle spasms., Disp: 90 tablet, Rfl: 1 .  zolpidem (AMBIEN) 10 MG tablet, Take 1 tablet (10 mg total) by mouth at bedtime as needed for sleep., Disp: 30 tablet, Rfl: 5  Review of Systems  Constitutional:  Positive for fatigue. Negative for activity change, appetite change, chills, diaphoresis, fever and unexpected weight change.  HENT: Positive for congestion, postnasal drip and rhinorrhea. Negative for ear discharge, ear pain, nosebleeds, sinus pressure, sinus pain, sneezing, sore throat, tinnitus, trouble swallowing and voice change.   Eyes: Negative.   Respiratory: Positive for cough, chest tightness and wheezing. Negative for apnea, choking, shortness of breath and stridor.   Gastrointestinal: Negative.  Negative for abdominal pain.  Musculoskeletal: Negative for neck pain and neck stiffness.  Neurological: Negative for dizziness, light-headedness and headaches.    Social History   Tobacco Use  . Smoking status: Never Smoker  . Smokeless tobacco: Never Used  Substance Use Topics  . Alcohol use: Yes    Alcohol/week: 0.0 oz    Comment: glass of wine occasionally   Objective:   BP 134/88 (BP Location: Left Arm, Patient Position: Sitting, Cuff Size: Normal)   Pulse 92   Temp 98.9 F (37.2 C) (Oral)   Resp 16   Wt 186 lb (84.4 kg)   BMI 25.94 kg/m  Vitals:   08/06/17 1501  BP: 134/88  Pulse: 92  Resp: 16  Temp: 98.9 F (37.2 C)  TempSrc: Oral  Weight: 186 lb (84.4 kg)     Physical Exam  Constitutional: He  is oriented to person, place, and time. He appears well-developed and well-nourished.  HENT:  Right Ear: Tympanic membrane and external ear normal.  Left Ear: Tympanic membrane and external ear normal.  Mouth/Throat: Oropharynx is clear and moist. No oropharyngeal exudate.  Cardiovascular: Normal rate and regular rhythm.  Pulmonary/Chest: Effort normal and breath sounds normal. No respiratory distress. He has no wheezes.  Lymphadenopathy:    He has cervical adenopathy.  Neurological: He is alert and oriented to person, place, and time.  Skin: Skin is warm and dry.  Psychiatric: He has a normal mood and affect. His behavior is normal.        Assessment & Plan:       1. Viral URI with cough  Discussed OTC delsym and corcidin for viral upper resp. Should be self limiting, 7-10 course.  Return if symptoms worsen or fail to improve.  The entirety of the information documented in the History of Present Illness, Review of Systems and Physical Exam were personally obtained by me. Portions of this information were initially documented by Ashley Royalty, CMA and reviewed by me for thoroughness and accuracy.          Trinna Post, PA-C  [facility]

## 2017-08-06 NOTE — Patient Instructions (Signed)
You were given 1 prescription for Nucynta today.

## 2017-08-06 NOTE — Patient Instructions (Addendum)
Delsym for cough Corcidin for decongestant   Upper Respiratory Infection, Adult Most upper respiratory infections (URIs) are caused by a virus. A URI affects the nose, throat, and upper air passages. The most common type of URI is often called "the common cold." Follow these instructions at home:  Take medicines only as told by your doctor.  Gargle warm saltwater or take cough drops to comfort your throat as told by your doctor.  Use a warm mist humidifier or inhale steam from a shower to increase air moisture. This may make it easier to breathe.  Drink enough fluid to keep your pee (urine) clear or pale yellow.  Eat soups and other clear broths.  Have a healthy diet.  Rest as needed.  Go back to work when your fever is gone or your doctor says it is okay. ? You may need to stay home longer to avoid giving your URI to others. ? You can also wear a face mask and wash your hands often to prevent spread of the virus.  Use your inhaler more if you have asthma.  Do not use any tobacco products, including cigarettes, chewing tobacco, or electronic cigarettes. If you need help quitting, ask your doctor. Contact a doctor if:  You are getting worse, not better.  Your symptoms are not helped by medicine.  You have chills.  You are getting more short of breath.  You have brown or red mucus.  You have yellow or brown discharge from your nose.  You have pain in your face, especially when you bend forward.  You have a fever.  You have puffy (swollen) neck glands.  You have pain while swallowing.  You have white areas in the back of your throat. Get help right away if:  You have very bad or constant: ? Headache. ? Ear pain. ? Pain in your forehead, behind your eyes, and over your cheekbones (sinus pain). ? Chest pain.  You have long-lasting (chronic) lung disease and any of the following: ? Wheezing. ? Long-lasting cough. ? Coughing up blood. ? A change in your usual  mucus.  You have a stiff neck.  You have changes in your: ? Vision. ? Hearing. ? Thinking. ? Mood. This information is not intended to replace advice given to you by your health care provider. Make sure you discuss any questions you have with your health care provider. Document Released: 03/03/2008 Document Revised: 05/18/2016 Document Reviewed: 12/21/2013 Elsevier Interactive Patient Education  2018 [employer].

## 2017-08-17 ENCOUNTER — Ambulatory Visit (INDEPENDENT_AMBULATORY_CARE_PROVIDER_SITE_OTHER): Payer: 59 | Admitting: Physician Assistant

## 2017-08-17 ENCOUNTER — Encounter: Payer: Self-pay | Admitting: Physician Assistant

## 2017-08-17 VITALS — BP 132/80 | HR 91 | Temp 98.7°F | Resp 16 | Wt 183.6 lb

## 2017-08-17 DIAGNOSIS — R05 Cough: Secondary | ICD-10-CM | POA: Diagnosis not present

## 2017-08-17 DIAGNOSIS — J019 Acute sinusitis, unspecified: Secondary | ICD-10-CM

## 2017-08-17 DIAGNOSIS — R059 Cough, unspecified: Secondary | ICD-10-CM

## 2017-08-17 MED ORDER — BENZONATATE 100 MG PO CAPS: 100.0000 mg | ORAL_CAPSULE | Freq: Three times a day (TID) | ORAL | 0 refills | Status: AC | PRN

## 2017-08-17 MED ORDER — DOXYCYCLINE HYCLATE 100 MG PO TABS: 100.0000 mg | ORAL_TABLET | Freq: Two times a day (BID) | ORAL | 0 refills | Status: AC

## 2017-08-17 NOTE — Progress Notes (Signed)
Patient: Shawn Crawford Male    DOB: April 25, 1961   56 y.o.   MRN: [id number] Visit Date: 08/17/2017  Today's Provider: Trinna Post, PA-C   Chief Complaint  Patient presents with  . URI   Subjective:    Shawn Crawford is a 56 y/o man presenting for cough/congestion. Was seen in clinic on 08/06/2017 with similar symptoms. He reports feeling better and then worse, followed by facial pain/pressure and purulent rhinorrhea. Has tried Delsym without relief.  URI   This is a recurrent problem. The current episode started 1 to 4 weeks ago (2 weeks ago). The problem has been gradually worsening. There has been no fever (Friday low grade fever). Associated symptoms include congestion, coughing, headaches, rhinorrhea and a sore throat. Pertinent negatives include no chest pain, diarrhea, ear pain, nausea, plugged ear sensation, sinus pain, sneezing or wheezing. He has tried decongestant and NSAIDs (IBU) for the symptoms. The treatment provided no relief.       Allergies  Allergen Reactions  . Penicillins      Current Outpatient Medications:  .  aspirin 325 MG tablet, Take 81 mg by mouth daily. , Disp: , Rfl:  .  COENZYME Q-10 PO, Take by mouth., Disp: , Rfl:  .  fexofenadine (ALLEGRA) 180 MG tablet, Take by mouth., Disp: , Rfl:  .  metoprolol tartrate (LOPRESSOR) 25 MG tablet, Take by mouth 2 (two) times daily. , Disp: , Rfl:  .  MULTIPLE VITAMINS PO, Take by mouth., Disp: , Rfl:  .  Omega-3 Fatty Acids (FISH OIL) 1200 MG CAPS, Take by mouth., Disp: , Rfl:  .  rosuvastatin (CRESTOR) 40 MG tablet, Take 1 tablet (40 mg total) by mouth daily., Disp: 90 tablet, Rfl: 3 .  tapentadol (NUCYNTA) 50 MG tablet, Take 1 tablet (50 mg total) every 8 (eight) hours as needed by mouth for severe pain., Disp: 90 tablet, Rfl: 0 .  tiZANidine (ZANAFLEX) 4 MG tablet, Take 1 tablet (4 mg total) by mouth every 8 (eight) hours as needed for muscle spasms. (Patient not taking: Reported on  08/17/2017), Disp: 90 tablet, Rfl: 1 .  zolpidem (AMBIEN) 10 MG tablet, Take 1 tablet (10 mg total) by mouth at bedtime as needed for sleep., Disp: 30 tablet, Rfl: 5  Review of Systems  HENT: Positive for congestion, postnasal drip, rhinorrhea, sore throat and voice change. Negative for ear pain, sinus pressure, sinus pain and sneezing.   Respiratory: Positive for cough. Negative for chest tightness, shortness of breath and wheezing.   Cardiovascular: Negative for chest pain, palpitations and leg swelling.  Gastrointestinal: Negative for diarrhea and nausea.  Neurological: Positive for headaches.    Social History   Tobacco Use  . Smoking status: Never Smoker  . Smokeless tobacco: Never Used  Substance Use Topics  . Alcohol use: Yes    Alcohol/week: 0.0 oz    Comment: glass of wine occasionally   Objective:   BP 132/80 (BP Location: Right Arm, Patient Position: Sitting, Cuff Size: Normal)   Pulse 91   Temp 98.7 F (37.1 C) (Oral)   Resp 16   Wt 183 lb 9.6 oz (83.3 kg)   SpO2 98%   BMI 25.61 kg/m    Physical Exam  Constitutional: He is oriented to person, place, and time. He appears well-developed and well-nourished. No distress.  HENT:  Right Ear: External ear normal.  Left Ear: External ear normal.  Nose: Right sinus exhibits maxillary sinus tenderness and  frontal sinus tenderness. Left sinus exhibits maxillary sinus tenderness and frontal sinus tenderness.  Mouth/Throat: Oropharynx is clear and moist and mucous membranes are normal. No oropharyngeal exudate, posterior oropharyngeal edema, posterior oropharyngeal erythema or tonsillar abscesses.  Eyes: Conjunctivae are normal.  Neck: Normal range of motion.  Cardiovascular: Normal rate and regular rhythm.  Pulmonary/Chest: Effort normal and breath sounds normal.  Neurological: He is alert and oriented to person, place, and time.  Skin: Skin is warm and dry. He is not diaphoretic.  Psychiatric: He has a normal mood and  affect. His behavior is normal.        Assessment & Plan:     1. Acute non-recurrent sinusitis, unspecified location  - doxycycline (VIBRA-TABS) 100 MG tablet; Take 1 tablet (100 mg total) 2 (two) times daily for 10 days by mouth.  Dispense: 20 tablet; Refill: 0  2. Cough  - benzonatate (TESSALON PERLES) 100 MG capsule; Take 1 capsule (100 mg total) 3 (three) times daily as needed for up to 7 days by mouth for cough.  Dispense: 21 capsule; Refill: 0  Return if symptoms worsen or fail to improve.  The entirety of the information documented in the History of Present Illness, Review of Systems and Physical Exam were personally obtained by me. Portions of this information were initially documented by Lyndel Pleasure, CMA and reviewed by me for thoroughness and accuracy.            Trinna Post, PA-C  [facility]

## 2017-08-17 NOTE — Patient Instructions (Signed)

## 2017-08-25 ENCOUNTER — Ambulatory Visit: Payer: 59 | Admitting: Family Medicine

## 2017-08-26 ENCOUNTER — Other Ambulatory Visit: Payer: Self-pay

## 2017-08-26 ENCOUNTER — Ambulatory Visit
Payer: 59 | Attending: Student in an Organized Health Care Education/Training Program | Admitting: Student in an Organized Health Care Education/Training Program

## 2017-08-26 ENCOUNTER — Telehealth: Payer: Self-pay | Admitting: *Deleted

## 2017-08-26 ENCOUNTER — Encounter: Payer: Self-pay | Admitting: Student in an Organized Health Care Education/Training Program

## 2017-08-26 VITALS — BP 144/95 | HR 83 | Temp 98.5°F | Resp 16 | Ht 71.0 in | Wt 178.0 lb

## 2017-08-26 DIAGNOSIS — N529 Male erectile dysfunction, unspecified: Secondary | ICD-10-CM | POA: Insufficient documentation

## 2017-08-26 DIAGNOSIS — M791 Myalgia, unspecified site: Secondary | ICD-10-CM | POA: Diagnosis not present

## 2017-08-26 DIAGNOSIS — M5116 Intervertebral disc disorders with radiculopathy, lumbar region: Secondary | ICD-10-CM | POA: Insufficient documentation

## 2017-08-26 DIAGNOSIS — M7918 Myalgia, other site: Secondary | ICD-10-CM

## 2017-08-26 DIAGNOSIS — G8929 Other chronic pain: Secondary | ICD-10-CM | POA: Diagnosis not present

## 2017-08-26 DIAGNOSIS — M545 Low back pain: Secondary | ICD-10-CM | POA: Diagnosis not present

## 2017-08-26 DIAGNOSIS — Z7982 Long term (current) use of aspirin: Secondary | ICD-10-CM | POA: Insufficient documentation

## 2017-08-26 DIAGNOSIS — Z79899 Other long term (current) drug therapy: Secondary | ICD-10-CM | POA: Diagnosis not present

## 2017-08-26 DIAGNOSIS — E559 Vitamin D deficiency, unspecified: Secondary | ICD-10-CM | POA: Insufficient documentation

## 2017-08-26 DIAGNOSIS — M5136 Other intervertebral disc degeneration, lumbar region: Secondary | ICD-10-CM

## 2017-08-26 DIAGNOSIS — Z880 Allergy status to penicillin: Secondary | ICD-10-CM | POA: Diagnosis not present

## 2017-08-26 DIAGNOSIS — E785 Hyperlipidemia, unspecified: Secondary | ICD-10-CM | POA: Diagnosis not present

## 2017-08-26 DIAGNOSIS — I2510 Atherosclerotic heart disease of native coronary artery without angina pectoris: Secondary | ICD-10-CM | POA: Diagnosis not present

## 2017-08-26 DIAGNOSIS — M1288 Other specific arthropathies, not elsewhere classified, other specified site: Secondary | ICD-10-CM | POA: Diagnosis not present

## 2017-08-26 DIAGNOSIS — I10 Essential (primary) hypertension: Secondary | ICD-10-CM | POA: Insufficient documentation

## 2017-08-26 DIAGNOSIS — M47816 Spondylosis without myelopathy or radiculopathy, lumbar region: Secondary | ICD-10-CM | POA: Diagnosis not present

## 2017-08-26 DIAGNOSIS — Z5181 Encounter for therapeutic drug level monitoring: Secondary | ICD-10-CM | POA: Diagnosis not present

## 2017-08-26 DIAGNOSIS — K219 Gastro-esophageal reflux disease without esophagitis: Secondary | ICD-10-CM | POA: Diagnosis not present

## 2017-08-26 DIAGNOSIS — R739 Hyperglycemia, unspecified: Secondary | ICD-10-CM | POA: Insufficient documentation

## 2017-08-26 MED ORDER — TAPENTADOL HCL 50 MG PO TABS: 50.0000 mg | ORAL_TABLET | Freq: Three times a day (TID) | ORAL | 0 refills | Status: DC | PRN

## 2017-08-26 NOTE — Patient Instructions (Signed)
1. Rx for Nucynta for 2 months 2. Our nursing staff will complete prior authorization. If you have any issues getting it covered beyond 1 week, please call our clinic.

## 2017-08-26 NOTE — Telephone Encounter (Signed)
I called [facility]  To check on prior auth for Nucynta Was on hold for approx 10 min and had to disconnect call because I needed to do patient care. I will try again later.

## 2017-08-26 NOTE — Progress Notes (Signed)
Nursing Pain Medication Assessment:  Safety precautions to be maintained throughout the outpatient stay will include: orient to surroundings, keep bed in low position, maintain call bell within reach at all times, provide assistance with transfer out of bed and ambulation.  Medication Inspection Compliance: Pill count conducted under aseptic conditions, in front of the patient. Neither the pills nor the bottle was removed from the patient's sight at any time. Once count was completed pills were immediately returned to the patient in their original bottle.  Medication: Tapentadol (Nucynta) Pill/Patch Count: 61 of 90 pills remain Pill/Patch Appearance: Markings consistent with prescribed medication Bottle Appearance: Standard pharmacy container. Clearly labeled. Filled Date: 60 / 8 / 2018 Last Medication intake:  Today at 8 am

## 2017-08-26 NOTE — Progress Notes (Signed)
Patient's Name: Shawn Crawford  MRN: [id number]  Referring Provider: Jerrol Banana.,*  DOB: 19-Jun-1961  PCP: Jerrol Banana., MD  DOS: 08/26/2017  Note by: Gillis Santa, MD  Service setting: Ambulatory outpatient  Specialty: Interventional Pain Management  Location: [facility] (AMB) Pain Management Facility    Patient type: Established   Primary Reason(s) for Visit: Encounter for prescription drug management. (Level of risk: moderate)  CC: Back Pain (lower back and down into both buttocks and groin)  HPI  [name] is a 56 y.o. year old, male patient, who comes today for a medication management evaluation. He has Abnormal blood sugar; Arteriosclerosis of coronary artery; ED (erectile dysfunction) of organic origin; Acid reflux; HLD (hyperlipidemia); BP (high blood pressure); Lumbar radiculopathy; Adiposity; Avitaminosis D; Hyperglycemia; and Benign essential HTN on their problem list. His primarily concern today is the Back Pain (lower back and down into both buttocks and groin)  Pain Assessment: Location: Lower Back Radiating: down into buttocks and groin Onset: More than a month ago Duration: Chronic pain Quality: Aching, Sore Severity: 2 /10 (Crawford-reported pain score)  Note: Reported level is compatible with observation.                         When using our objective Pain Scale, levels between 6 and 10/10 are said to belong in an emergency room, as it progressively worsens from a 6/10, described as severely limiting, requiring emergency care not usually available at an outpatient pain management facility. At a 6/10 level, communication becomes difficult and requires great effort. Assistance to reach the emergency department may be required. Facial flushing and profuse sweating along with potentially dangerous increases in heart rate and blood pressure will be evident. Effect on ADL:   Timing: Constant Modifying factors: laying down on side, nucynta  [name] was last scheduled  for an appointment on 08/06/2017 for medication management. During today's appointment we reviewed [name] chronic pain status, as well as his outpatient medication regimen.  The patient  reports that he does not use drugs. His body mass index is 24.83 kg/m.  Further details on both, my assessment(s), as well as the proposed treatment plan, please see below.  Controlled Substance Pharmacotherapy Assessment REMS (Risk Evaluation and Mitigation Strategy)  Analgesic: Nucynta 50 mg 3 times daily as needed, quantity 72-monthMME/day: Less than 10 mg/day.  KLajean Silvius RN  08/26/2017 10:10 AM  Sign at close encounter Nursing Pain Medication Assessment:  Safety precautions to be maintained throughout the outpatient stay will include: orient to surroundings, keep bed in low position, maintain call bell within reach at all times, provide assistance with transfer out of bed and ambulation.  Medication Inspection Compliance: Pill count conducted under aseptic conditions, in front of the patient. Neither the pills nor the bottle was removed from the patient's sight at any time. Once count was completed pills were immediately returned to the patient in their original bottle.  Medication: Tapentadol (Nucynta) Pill/Patch Count: 61 of 90 pills remain Pill/Patch Appearance: Markings consistent with prescribed medication Bottle Appearance: Standard pharmacy container. Clearly labeled. Filled Date: 155/ 8 / 2018 Last Medication intake:  Today at 8 am   Pharmacokinetics: Liberation and absorption (onset of action): WNL Distribution (time to peak effect): WNL Metabolism and excretion (duration of action): WNL         Pharmacodynamics: Desired effects: Analgesia: Mr. DAbdallahreports >50% benefit. Functional ability: Patient reports that medication allows him  to accomplish basic ADLs Clinically meaningful improvement in function (CMIF): Sustained CMIF goals met Perceived effectiveness: Described as  relatively effective, allowing for increase in activities of daily living (ADL) Undesirable effects: Side-effects or Adverse reactions: None reported Monitoring: [place] PMP: Online review of the past 89-monthperiod conducted. Compliant with practice rules and regulations Last UDS on record: Summary  Date Value Ref Range Status  07/07/2017 FINAL  Corrected    Comment:    ==================================================================== TOXASSURE COMP DRUG ANALYSIS,UR ==================================================================== Test                             Result       Flag       Units Drug Present and Declared for Prescription Verification   Zolpidem                       PRESENT      EXPECTED   Zolpidem Acid                  PRESENT      EXPECTED    Zolpidem acid is an expected metabolite of zolpidem.   Metoprolol                     PRESENT      EXPECTED Drug Present not Declared for Prescription Verification   Naproxen                       PRESENT      UNEXPECTED Drug Absent but Declared for Prescription Verification   Tizanidine                     Not Detected UNEXPECTED    Tizanidine, as indicated in the declared medication list, is not    always detected even when used as directed.   Salicylate                     Not Detected UNEXPECTED    Aspirin, as indicated in the declared medication list, is not    always detected even when used as directed. ==================================================================== Test                      Result    Flag   Units      Ref Range   Creatinine              73               mg/dL      >=20 ==================================================================== Declared Medications:  The flagging and interpretation on this report are based on the  following declared medications.  Unexpected results may arise from  inaccuracies in the declared medications.  **Note: The testing scope of this panel includes these  medications:  Metoprolol (Lopressor)  **Note: The testing scope of this panel does not include small to  moderate amounts of these reported medications:  Aspirin  Tizanidine (Zanaflex)  Zolpidem (Ambien)  **Note: The testing scope of this panel does not include following  reported medications:  Fexofenadine (Allegra)  Omega-3 Fatty Acids (Fish Oil)  Rosuvastatin (Crestor)  Ubiquinone (CoQ10) ==================================================================== For clinical consultation, please call ([phone] ====================================================================    UDS interpretation: Compliant          Medication Assessment Form: Reviewed. Patient indicates being compliant with therapy Treatment compliance: Compliant Risk Assessment Profile:  Aberrant behavior: See prior evaluations. None observed or detected today Comorbid factors increasing risk of overdose: See prior notes. No additional risks detected today Risk of substance use disorder (SUD): Low Opioid Risk Tool - 08/26/17 1004      Family History of Substance Abuse   Alcohol  Negative    Illegal Drugs  Negative    Rx Drugs  Negative      Personal History of Substance Abuse   Alcohol  Negative    Illegal Drugs  Negative    Rx Drugs  Negative      Age   Age between 55-45 years   No      History of Preadolescent Sexual Abuse   History of Preadolescent Sexual Abuse  Negative or Male      Psychological Disease   Psychological Disease  Negative    Depression  Negative      Total Score   Opioid Risk Tool Scoring  0    Opioid Risk Interpretation  Low Risk      ORT Scoring interpretation table:  Score <3 = Low Risk for SUD  Score between 4-7 = Moderate Risk for SUD  Score >8 = High Risk for Opioid Abuse   Risk Mitigation Strategies:  Patient Counseling: Covered Patient-Prescriber Agreement (PPA): Present and active  Notification to other healthcare providers: Done  Pharmacologic Plan: No  change in therapy, at this time  Laboratory Chemistry  Inflammation Markers (CRP: Acute Phase) (ESR: Chronic Phase) Lab Results  Component Value Date   ESRSEDRATE 2 05/12/2017                 Rheumatology Markers Lab Results  Component Value Date   ANA Negative 05/12/2017   LABURIC 7.5 07/08/2017                Renal Function Markers Lab Results  Component Value Date   BUN 27 (H) 02/12/2017   CREATININE 1.29 (H) 02/12/2017   GFRAA 71 02/12/2017   GFRNONAA 62 02/12/2017                 Hepatic Function Markers Lab Results  Component Value Date   AST 36 02/12/2017   ALT 47 (H) 02/12/2017   ALBUMIN 4.8 02/12/2017   ALKPHOS 59 02/12/2017                 Electrolytes Lab Results  Component Value Date   NA 142 02/12/2017   K 4.9 02/12/2017   CL 98 02/12/2017   CALCIUM 10.0 02/12/2017                 Neuropathy Markers Lab Results  Component Value Date   HGBA1C 5.8 (H) 02/12/2017                 Bone Pathology Markers No results found for: VD25OH, FB510CH8NID, PO2423NT6, RW4315QM0, 25OHVITD1, 25OHVITD2, 25OHVITD3, TESTOFREE, TESTOSTERONE               Coagulation Parameters Lab Results  Component Value Date   PLT 201 07/08/2017                 Cardiovascular Markers Lab Results  Component Value Date   HGB 15.2 07/08/2017   HCT 43.1 07/08/2017                 CA Markers No results found for: CEA, CA125, LABCA2               Note: Lab results reviewed.  Recent Diagnostic Imaging Results  DG Foot Complete Right CLINICAL DATA:  Pain in the great toe for 48 hours. Swelling of the first MTP joint. Concern for gout.  EXAM: RIGHT FOOT COMPLETE - 3+ VIEW  COMPARISON:  None.  FINDINGS: There is no evidence of fracture or dislocation. Minimal spurring along the lateral aspect of the first MTP joint. Otherwise, no significant arthropathy or degenerative disease. Soft tissues are unremarkable. Enthesopathic changes at the insertion of the  Achilles tendon.  IMPRESSION: No acute bone abnormality.  No significant arthropathy at the first MTP joint.  Electronically Signed   By: Markus Daft M.D.   On: 07/08/2017 16:03  Complexity Note: Imaging results reviewed. Results shared with Mr. Rappaport, using Layman's terms.                         Meds   Current Outpatient Medications:  .  aspirin EC 81 MG tablet, Take 81 mg by mouth daily., Disp: , Rfl:  .  COENZYME Q-10 PO, Take 1 capsule by mouth daily. , Disp: , Rfl:  .  doxycycline (VIBRA-TABS) 100 MG tablet, Take 1 tablet (100 mg total) 2 (two) times daily for 10 days by mouth., Disp: 20 tablet, Rfl: 0 .  fexofenadine (ALLEGRA) 180 MG tablet, Take 180 mg by mouth daily as needed. , Disp: , Rfl:  .  metoprolol tartrate (LOPRESSOR) 25 MG tablet, Take by mouth 2 (two) times daily. , Disp: , Rfl:  .  MULTIPLE VITAMINS PO, Take 1 tablet by mouth daily. , Disp: , Rfl:  .  Omega-3 Fatty Acids (FISH OIL) 1200 MG CAPS, Take 1 capsule by mouth daily. , Disp: , Rfl:  .  rosuvastatin (CRESTOR) 40 MG tablet, Take 1 tablet (40 mg total) by mouth daily., Disp: 90 tablet, Rfl: 3 .  tapentadol (NUCYNTA) 50 MG tablet, Take 1 tablet (50 mg total) by mouth every 8 (eight) hours as needed for severe pain., Disp: 90 tablet, Rfl: 0 .  zolpidem (AMBIEN) 10 MG tablet, Take 1 tablet (10 mg total) by mouth at bedtime as needed for sleep., Disp: 30 tablet, Rfl: 5 .  tiZANidine (ZANAFLEX) 4 MG tablet, Take 1 tablet (4 mg total) by mouth every 8 (eight) hours as needed for muscle spasms. (Patient not taking: Reported on 08/17/2017), Disp: 90 tablet, Rfl: 1  ROS  Constitutional: Denies any fever or chills Gastrointestinal: No reported hemesis, hematochezia, vomiting, or acute GI distress Musculoskeletal: Denies any acute onset joint swelling, redness, loss of ROM, or weakness Neurological: No reported episodes of acute onset apraxia, aphasia, dysarthria, agnosia, amnesia, paralysis, loss of coordination, or  loss of consciousness  Allergies  Mr. Morrish is allergic to penicillins.  [place]  Drug: Mr. Oakley  reports that he does not use drugs. Alcohol:  reports that he drinks alcohol. Tobacco:  reports that  has never smoked. he has never used smokeless tobacco. Medical:  has a past medical history of cardiac catheterization, Hyperlipidemia, and Hypertension. Surgical: Mr. Homann  has a past surgical history that includes Coronary artery bypass graft; Hernia repair; and Appendectomy. Family: family history includes Colon cancer in his sister; Diabetes in his father; Hyperlipidemia in his mother; Hypertension in his father; Thyroid cancer in his sister.  Constitutional Exam  General appearance: Well nourished, well developed, and well hydrated. In no apparent acute distress Vitals:   08/26/17 0952  BP: (!) 144/95  Pulse: 83  Resp: 16  Temp: 98.5 F (36.9 C)  TempSrc: Oral  SpO2: 100%  Weight: 178 lb (80.7 kg)  Height: '5\' 11"'$  (1.803 m)   BMI Assessment: Estimated body mass index is 24.83 kg/m as calculated from the following:   Height as of this encounter: '5\' 11"'$  (1.803 m).   Weight as of this encounter: 178 lb (80.7 kg).  BMI interpretation table: BMI level Category Range association with higher incidence of chronic pain  <18 kg/m2 Underweight   18.5-24.9 kg/m2 Ideal body weight   25-29.9 kg/m2 Overweight Increased incidence by 20%  30-34.9 kg/m2 Obese (Class I) Increased incidence by 68%  35-39.9 kg/m2 Severe obesity (Class II) Increased incidence by 136%  >40 kg/m2 Extreme obesity (Class III) Increased incidence by 254%   BMI Readings from Last 4 Encounters:  08/26/17 24.83 kg/m  08/17/17 25.61 kg/m  08/06/17 25.94 kg/m  08/06/17 25.80 kg/m   Wt Readings from Last 4 Encounters:  08/26/17 178 lb (80.7 kg)  08/17/17 183 lb 9.6 oz (83.3 kg)  08/06/17 186 lb (84.4 kg)  08/06/17 185 lb (83.9 kg)  Psych/Mental status: Alert, oriented x 3 (person, place, & time)       Eyes:  PERLA Respiratory: No evidence of acute respiratory distress  Cervical Spine Area Exam  Skin & Axial Inspection: No masses, redness, edema, swelling, or associated skin lesions Alignment: Symmetrical Functional ROM: Unrestricted ROM      Stability: No instability detected Muscle Tone/Strength: Functionally intact. No obvious neuro-muscular anomalies detected. Sensory (Neurological): Unimpaired Palpation: No palpable anomalies              Upper Extremity (UE) Exam    Side: Right upper extremity  Side: Left upper extremity  Skin & Extremity Inspection: Skin color, temperature, and hair growth are WNL. No peripheral edema or cyanosis. No masses, redness, swelling, asymmetry, or associated skin lesions. No contractures.  Skin & Extremity Inspection: Skin color, temperature, and hair growth are WNL. No peripheral edema or cyanosis. No masses, redness, swelling, asymmetry, or associated skin lesions. No contractures.  Functional ROM: Unrestricted ROM          Functional ROM: Unrestricted ROM          Muscle Tone/Strength: Functionally intact. No obvious neuro-muscular anomalies detected.  Muscle Tone/Strength: Functionally intact. No obvious neuro-muscular anomalies detected.  Sensory (Neurological): Unimpaired          Sensory (Neurological): Unimpaired          Palpation: No palpable anomalies              Palpation: No palpable anomalies              Specialized Test(s): Deferred         Specialized Test(s): Deferred          Thoracic Spine Area Exam  Skin & Axial Inspection: No masses, redness, or swelling Alignment: Symmetrical Functional ROM: Unrestricted ROM Stability: No instability detected Muscle Tone/Strength: Functionally intact. No obvious neuro-muscular anomalies detected. Sensory (Neurological): Unimpaired Muscle strength & Tone: No palpable anomalies  Lumbar Spine Area Exam  Skin & Axial Inspection: No masses, redness, or swelling Alignment: Symmetrical Functional ROM:  Unrestricted ROM      Stability: No instability detected Muscle Tone/Strength: Functionally intact. No obvious neuro-muscular anomalies detected. Sensory (Neurological): Unimpaired Palpation: No palpable anomalies       Provocative Tests: Lumbar Hyperextension and rotation test: evaluation deferred today       Lumbar Lateral bending test: evaluation deferred today       Patrick's Maneuver: evaluation  deferred today                    Gait & Posture Assessment  Ambulation: Unassisted Gait: Relatively normal for age and body habitus Posture: WNL   Lower Extremity Exam    Side: Right lower extremity  Side: Left lower extremity  Skin & Extremity Inspection: Skin color, temperature, and hair growth are WNL. No peripheral edema or cyanosis. No masses, redness, swelling, asymmetry, or associated skin lesions. No contractures.  Skin & Extremity Inspection: Skin color, temperature, and hair growth are WNL. No peripheral edema or cyanosis. No masses, redness, swelling, asymmetry, or associated skin lesions. No contractures.  Functional ROM: Unrestricted ROM          Functional ROM: Unrestricted ROM          Muscle Tone/Strength: Functionally intact. No obvious neuro-muscular anomalies detected.  Muscle Tone/Strength: Functionally intact. No obvious neuro-muscular anomalies detected.  Sensory (Neurological): Unimpaired  Sensory (Neurological): Unimpaired  Palpation: No palpable anomalies  Palpation: No palpable anomalies   Assessment  Primary Diagnosis & Pertinent Problem List: The primary encounter diagnosis was Lumbar degenerative disc disease. Diagnoses of Lumbar facet arthropathy and Piriformis muscle pain were also pertinent to this visit.  Status Diagnosis  Controlled Controlled Controlled 1. Lumbar degenerative disc disease   2. Lumbar facet arthropathy   3. Piriformis muscle pain       56 year old male with axial low back pain that radiates into bilateral buttocks and posterior  thighs, onset and duration approximately 3 years. Patient's lumbar MRI is significant for lumbar spondylosis, facet arthropathy and L3-L4 disc bulge. Patient has had 2 diagnostic lumbar medial branch nerve blocks which were not effective. He has undergone physical therapy and did not obtain any significant benefit. On physical exam patient's straight leg raise test was negative, Patrick's was negative, lumbar facet loading and lateral rotation was negative as well.This could potentially suggest a myofascial component to his pain.   Patient returns for follow-up.  He states that Nucynta is effective for his pain.  He takes it once to twice a day as needed.  We will continue this medication and I will provide a refill for 2 months.  Plan: -Nucynta 50 mg 3 times daily as needed, quantity 90 a month.  Prescription for 2 months -Follow-up in 2 months.   Plan of Care  Pharmacotherapy (Medications Ordered): Meds ordered this encounter  Medications  . DISCONTD: tapentadol (NUCYNTA) 50 MG tablet    Sig: Take 1 tablet (50 mg total) by mouth every 8 (eight) hours as needed for severe pain.    Dispense:  90 tablet    Refill:  0    For chronic pain: 09/17/17, 10/17/17 To last for 30 days from fill date  . tapentadol (NUCYNTA) 50 MG tablet    Sig: Take 1 tablet (50 mg total) by mouth every 8 (eight) hours as needed for severe pain.    Dispense:  90 tablet    Refill:  0    For chronic pain: 09/17/17, 10/17/17 To last for 30 days from fill date    Provider-requested follow-up: Return in about 8 weeks (around 10/21/2017) for Medication Management with Crystal.  Future Appointments  Date Time Provider [place]  10/21/2017  9:15 AM Vevelyn Francois, NP [facility] None  11/24/2017  8:30 AM Jerrol Banana., MD [facility] None    Primary Care Physician: Jerrol Banana., MD Location: [facility] Note by: Gillis Santa,  M.D Date: 08/26/2017; Time: 3:52  PM  Patient Instructions  1. Rx for Nucynta for 2 months 2. Our nursing staff will complete prior authorization. If you have any issues getting it covered beyond 1 week, please call our clinic.

## 2017-08-27 ENCOUNTER — Telehealth: Payer: Self-pay

## 2017-08-27 ENCOUNTER — Encounter: Payer: 59 | Admitting: Student in an Organized Health Care Education/Training Program

## 2017-08-27 NOTE — Telephone Encounter (Signed)
Left voicemail for patient to return call with needed prescription insurance info. So that I can get Nucynta approved.

## 2017-08-31 NOTE — Telephone Encounter (Signed)
error 

## 2017-09-16 ENCOUNTER — Encounter: Payer: Self-pay | Admitting: Student in an Organized Health Care Education/Training Program

## 2017-09-18 ENCOUNTER — Telehealth: Payer: Self-pay

## 2017-09-18 ENCOUNTER — Telehealth: Payer: Self-pay | Admitting: Student in an Organized Health Care Education/Training Program

## 2017-09-18 NOTE — Telephone Encounter (Signed)
Patient called to see if PA had been done.  It looks like it was done on the 19th.  We have not received a response as of yet.  Instructed patient to take script and if they only would fill it for 7 days, then to get it back and call us and let us know.  Informed patient that Dr Holley Raring would be back in the office on Wednesday.  Patient states that he would be going out of town on Thursday.

## 2017-09-18 NOTE — Telephone Encounter (Signed)
Patient lvmail asking if his prior auth for meds has been done. Please let him know so he can go pick up meds

## 2017-09-18 NOTE — Telephone Encounter (Signed)
See next message in encounters.

## 2017-09-23 NOTE — Telephone Encounter (Signed)
Attempted to call patient to notify him that his Nucynta has been approved.  Not able to leave message.

## 2017-10-15 ENCOUNTER — Encounter: Payer: Self-pay | Admitting: Family Medicine

## 2017-10-15 ENCOUNTER — Ambulatory Visit (INDEPENDENT_AMBULATORY_CARE_PROVIDER_SITE_OTHER): Payer: 59 | Admitting: Family Medicine

## 2017-10-15 VITALS — BP 152/92 | HR 68 | Temp 97.9°F | Resp 14 | Wt 184.0 lb

## 2017-10-15 DIAGNOSIS — R001 Bradycardia, unspecified: Secondary | ICD-10-CM

## 2017-10-15 DIAGNOSIS — I10 Essential (primary) hypertension: Secondary | ICD-10-CM | POA: Diagnosis not present

## 2017-10-15 MED ORDER — METOPROLOL TARTRATE 25 MG PO TABS: 12.5000 mg | ORAL_TABLET | Freq: Two times a day (BID) | ORAL | 1 refills | Status: DC

## 2017-10-15 MED ORDER — AMLODIPINE BESYLATE 5 MG PO TABS: 5.0000 mg | ORAL_TABLET | Freq: Every day | ORAL | 3 refills | Status: DC

## 2017-10-15 NOTE — Progress Notes (Signed)
Patient: Shawn Crawford Male    DOB: July 12, 1961   57 y.o.   MRN: [id number] Visit Date: 10/15/2017  Today's Provider: [name], MD   Chief Complaint  Patient presents with  . Hypertension  . Bradycardia   Subjective:    HPI Pt is here today concerned about his blood pressure and heart rate. He reports that his BP has been running higher at home 140-150's/ 70-75's and his heart has been getting low like in the 40's. He reports that he gets very dizzy when this happens and almost feels like he is going to pass out. He reports that once he thought he was going to have to go to the ER. No chest pain or SOB. He is taking Metoprolol tartrate 25 mg BID.   ------------------------------------------------------------------------      Allergies  Allergen Reactions  . Penicillins Itching     Current Outpatient Medications:  .  aspirin EC 81 MG tablet, Take 81 mg by mouth daily., Disp: , Rfl:  .  COENZYME Q-10 PO, Take 1 capsule by mouth daily. , Disp: , Rfl:  .  metoprolol tartrate (LOPRESSOR) 25 MG tablet, Take 0.5 tablets (12.5 mg total) by mouth 2 (two) times daily., Disp: 60 tablet, Rfl: 1 .  MULTIPLE VITAMINS PO, Take 1 tablet by mouth daily. , Disp: , Rfl:  .  rosuvastatin (CRESTOR) 40 MG tablet, Take 1 tablet (40 mg total) by mouth daily., Disp: 90 tablet, Rfl: 3 .  tapentadol (NUCYNTA) 50 MG tablet, Take 1 tablet (50 mg total) by mouth every 8 (eight) hours as needed for severe pain., Disp: 90 tablet, Rfl: 0 .  amLODipine (NORVASC) 5 MG tablet, Take 1 tablet (5 mg total) by mouth daily., Disp: 90 tablet, Rfl: 3 .  fexofenadine (ALLEGRA) 180 MG tablet, Take 180 mg by mouth daily as needed. , Disp: , Rfl:  .  Omega-3 Fatty Acids (FISH OIL) 1200 MG CAPS, Take 1 capsule by mouth daily. , Disp: , Rfl:  .  tiZANidine (ZANAFLEX) 4 MG tablet, Take 1 tablet (4 mg total) by mouth every 8 (eight) hours as needed for muscle spasms. (Patient not taking: Reported on  08/17/2017), Disp: 90 tablet, Rfl: 1 .  zolpidem (AMBIEN) 10 MG tablet, Take 1 tablet (10 mg total) by mouth at bedtime as needed for sleep., Disp: 30 tablet, Rfl: 5  Review of Systems  Constitutional: Negative.   HENT: Negative.   Eyes: Negative.   Respiratory: Negative.   Cardiovascular: Negative.   Gastrointestinal: Negative.   Endocrine: Negative.   Genitourinary: Negative.   Musculoskeletal: Negative.   Skin: Negative.   Allergic/Immunologic: Negative.   Neurological: Positive for dizziness.       Pre syncope  Hematological: Negative.   Psychiatric/Behavioral: Negative.     Social History   Tobacco Use  . Smoking status: Never Smoker  . Smokeless tobacco: Never Used  Substance Use Topics  . Alcohol use: Yes    Alcohol/week: 0.0 oz    Comment: glass of wine occasionally   Objective:   BP (!) 152/92 (BP Location: Right Arm, Patient Position: Sitting, Cuff Size: Normal)   Pulse 68   Temp 97.9 F (36.6 C) (Oral)   Resp 14   Wt 184 lb (83.5 kg)   BMI 25.66 kg/m  Vitals:   10/15/17 1548  BP: (!) 152/92  Pulse: 68  Resp: 14  Temp: 97.9 F (36.6 C)  TempSrc: Oral  Weight: 184 lb (83.5  kg)     Physical Exam  Constitutional: He is oriented to person, place, and time. He appears well-developed and well-nourished.  HENT:  Head: Normocephalic and atraumatic.  Right Ear: External ear normal.  Left Ear: External ear normal.  Nose: Nose normal.  Eyes: Conjunctivae and EOM are normal. Pupils are equal, round, and reactive to light.  Neck: Normal range of motion. No thyromegaly present.  Cardiovascular: Normal rate, regular rhythm, normal heart sounds and intact distal pulses.  Pulmonary/Chest: Effort normal and breath sounds normal.  Abdominal: Soft.  Musculoskeletal: Normal range of motion.  Neurological: He is alert and oriented to person, place, and time. He has normal reflexes.  Skin: Skin is warm and dry.  Psychiatric: He has a normal mood and affect. His  behavior is normal. Judgment and thought content normal.        Assessment & Plan:     1. Bradycardia Cut metoprolol in half. Follow up in 1 month - EKG 12-Lead - metoprolol tartrate (LOPRESSOR) 25 MG tablet; Take 0.5 tablets (12.5 mg total) by mouth 2 (two) times daily.  Dispense: 60 tablet; Refill: 1  2. Essential hypertension Elevated. Start Amlodipine and follow up in 1 month  - amLODipine (NORVASC) 5 MG tablet; Take 1 tablet (5 mg total) by mouth daily.  Dispense: 90 tablet; Refill: 3 3.CAD/s/p CABG      HPI, Exam, and A&P Transcribed under the direction and in the presence of Bobbie Virden L. Cranford Mon, MD  Electronically Signed: Katina Dung, CMA  I have done the exam and reviewed the above chart and it is accurate to the best of my knowledge. Development worker, community has been used in this note in any air is in the dictation or transcription are unintentional.  [name], MD  [place]

## 2017-10-15 NOTE — Patient Instructions (Signed)
Cut metoprolol in half and start amlodipine. Follow up 1 month

## 2017-10-21 ENCOUNTER — Encounter: Payer: Self-pay | Admitting: Nurse Practitioner

## 2017-10-21 ENCOUNTER — Ambulatory Visit: Payer: 59 | Attending: Nurse Practitioner | Admitting: Nurse Practitioner

## 2017-10-21 VITALS — BP 138/88 | HR 70 | Temp 98.2°F | Resp 16 | Ht 71.0 in | Wt 183.0 lb

## 2017-10-21 DIAGNOSIS — M5416 Radiculopathy, lumbar region: Secondary | ICD-10-CM | POA: Diagnosis not present

## 2017-10-21 DIAGNOSIS — I10 Essential (primary) hypertension: Secondary | ICD-10-CM | POA: Diagnosis not present

## 2017-10-21 DIAGNOSIS — G894 Chronic pain syndrome: Secondary | ICD-10-CM | POA: Diagnosis not present

## 2017-10-21 DIAGNOSIS — N529 Male erectile dysfunction, unspecified: Secondary | ICD-10-CM | POA: Insufficient documentation

## 2017-10-21 DIAGNOSIS — Z833 Family history of diabetes mellitus: Secondary | ICD-10-CM | POA: Insufficient documentation

## 2017-10-21 DIAGNOSIS — Z808 Family history of malignant neoplasm of other organs or systems: Secondary | ICD-10-CM | POA: Insufficient documentation

## 2017-10-21 DIAGNOSIS — Z7982 Long term (current) use of aspirin: Secondary | ICD-10-CM | POA: Insufficient documentation

## 2017-10-21 DIAGNOSIS — E785 Hyperlipidemia, unspecified: Secondary | ICD-10-CM | POA: Insufficient documentation

## 2017-10-21 DIAGNOSIS — M545 Low back pain, unspecified: Secondary | ICD-10-CM

## 2017-10-21 DIAGNOSIS — K219 Gastro-esophageal reflux disease without esophagitis: Secondary | ICD-10-CM | POA: Insufficient documentation

## 2017-10-21 DIAGNOSIS — Z951 Presence of aortocoronary bypass graft: Secondary | ICD-10-CM | POA: Diagnosis not present

## 2017-10-21 DIAGNOSIS — E559 Vitamin D deficiency, unspecified: Secondary | ICD-10-CM | POA: Diagnosis not present

## 2017-10-21 DIAGNOSIS — G8929 Other chronic pain: Secondary | ICD-10-CM

## 2017-10-21 DIAGNOSIS — M47816 Spondylosis without myelopathy or radiculopathy, lumbar region: Secondary | ICD-10-CM

## 2017-10-21 DIAGNOSIS — R7309 Other abnormal glucose: Secondary | ICD-10-CM | POA: Diagnosis not present

## 2017-10-21 DIAGNOSIS — Z5181 Encounter for therapeutic drug level monitoring: Secondary | ICD-10-CM | POA: Diagnosis not present

## 2017-10-21 DIAGNOSIS — Z880 Allergy status to penicillin: Secondary | ICD-10-CM | POA: Diagnosis not present

## 2017-10-21 DIAGNOSIS — R739 Hyperglycemia, unspecified: Secondary | ICD-10-CM | POA: Insufficient documentation

## 2017-10-21 DIAGNOSIS — M1288 Other specific arthropathies, not elsewhere classified, other specified site: Secondary | ICD-10-CM | POA: Insufficient documentation

## 2017-10-21 DIAGNOSIS — I2510 Atherosclerotic heart disease of native coronary artery without angina pectoris: Secondary | ICD-10-CM | POA: Insufficient documentation

## 2017-10-21 DIAGNOSIS — Z79899 Other long term (current) drug therapy: Secondary | ICD-10-CM | POA: Diagnosis not present

## 2017-10-21 MED ORDER — TAPENTADOL HCL 50 MG PO TABS: 50.0000 mg | ORAL_TABLET | Freq: Three times a day (TID) | ORAL | 0 refills | Status: DC | PRN

## 2017-10-21 NOTE — Progress Notes (Signed)
Nursing Pain Medication Assessment:  Safety precautions to be maintained throughout the outpatient stay will include: orient to surroundings, keep bed in low position, maintain call bell within reach at all times, provide assistance with transfer out of bed and ambulation.  Medication Inspection Compliance: Pill count conducted under aseptic conditions, in front of the patient. Neither the pills nor the bottle was removed from the patient's sight at any time. Once count was completed pills were immediately returned to the patient in their original bottle.  Medication: Tapentadol (Nucynta) Pill/Patch Count: 14 of 90 pills remain Pill/Patch Appearance: Markings consistent with prescribed medication Bottle Appearance: Standard pharmacy container. Clearly labeled. Filled Date: 63 / 26 / 2018 Last Medication intake:  Today

## 2017-10-21 NOTE — Progress Notes (Signed)
Patient's Name: Shawn Crawford  MRN: [id number]  Referring Provider: Jerrol Banana.,*  DOB: 05-May-1961  PCP: Jerrol Banana., MD  DOS: 10/21/2017  Note by: Vevelyn Francois NP  Service setting: Ambulatory outpatient  Specialty: Interventional Pain Management  Location: [facility] (AMB) Pain Management Facility    Patient type: Established    Primary Reason(s) for Visit: Encounter for prescription drug management. (Level of risk: moderate)  CC: Back Pain (lower bilateral)  HPI  Shawn Crawford is a 57 y.o. year old, male patient, who comes today for a medication management evaluation. He has Abnormal blood sugar; Arteriosclerosis of coronary artery; ED (erectile dysfunction) of organic origin; Acid reflux; HLD (hyperlipidemia); BP (high blood pressure); Lumbar radiculopathy; Adiposity; Avitaminosis D; Hyperglycemia; Benign essential HTN; Chronic pain syndrome; Chronic low back pain; and Lumbar facet arthropathy on their problem list. His primarily concern today is the Back Pain (lower bilateral)  Pain Assessment: Location: Left, Right, Lower Back Radiating: into buttocks and groin Onset: More than a month ago Duration: Chronic pain Quality: Aching, Discomfort Severity: 2 /10 (self-reported pain score)  Note: Reported level is compatible with observation.                          Effect on ADL: pain is worse in the afternoon and limiting Timing: Intermittent Modifying factors: laying on the side with pillow for support  Shawn Crawford was last scheduled for an appointment on Visit date not found for medication management. During today's appointment we reviewed Shawn Crawford chronic pain status, as well as his outpatient medication regimen. He denies any change in his pain. He admits that it is worse in the late afternoon. He is concern if the Nucynta is effective. He states that he uses it BID. He was told that he could use it TID. He denies any side effects. He denies any previous use of Narcotics.  He ask about Ultram .  The patient  reports that he does not use drugs. His body mass index is 25.52 kg/m.  Further details on both, my assessment(s), as well as the proposed treatment plan, please see below.  Controlled Substance Pharmacotherapy Assessment REMS (Risk Evaluation and Mitigation Strategy)  Analgesic: Nucynta 50 mg 3 times daily as needed, quantity 28-monthMME/day: Less than 10 mg/day.   PJanett Billow RN  10/21/2017  9:22 AM  Sign at close encounter Nursing Pain Medication Assessment:  Safety precautions to be maintained throughout the outpatient stay will include: orient to surroundings, keep bed in low position, maintain call bell within reach at all times, provide assistance with transfer out of bed and ambulation.  Medication Inspection Compliance: Pill count conducted under aseptic conditions, in front of the patient. Neither the pills nor the bottle was removed from the patient's sight at any time. Once count was completed pills were immediately returned to the patient in their original bottle.  Medication: Tapentadol (Nucynta) Pill/Patch Count: 14 of 90 pills remain Pill/Patch Appearance: Markings consistent with prescribed medication Bottle Appearance: Standard pharmacy container. Clearly labeled. Filled Date: 150/ 26 / 2018 Last Medication intake:  Today   Pharmacokinetics: Liberation and absorption (onset of action): WNL Distribution (time to peak effect): WNL Metabolism and excretion (duration of action): WNL         Pharmacodynamics: Desired effects: Analgesia: Shawn Crawford >50% benefit. Functional ability: Patient reports that medication allows him to accomplish basic ADLs Clinically meaningful improvement in function (CMIF): Sustained CMIF  goals met Perceived effectiveness: Described as relatively effective, allowing for increase in activities of daily living (ADL) Undesirable effects: Side-effects or Adverse reactions: None  reported Monitoring: [place] PMP: Online review of the past 75-monthperiod conducted. Compliant with practice rules and regulations Last UDS on record: Summary  Date Value Ref Range Status  07/07/2017 FINAL  Corrected    Comment:    ==================================================================== TOXAS[facility]RE COMP DRUG ANALYSIS,UR ==================================================================== Test                             Result       Flag       Units Drug Present and Declared for Prescription Verification   Zolpidem                       PRESENT      EXPECTED   Zolpidem Acid                  PRESENT      EXPECTED    Zolpidem acid is an expected metabolite of zolpidem.   Metoprolol                     PRESENT      EXPECTED Drug Present not Declared for Prescription Verification   Naproxen                       PRESENT      UNEXPECTED Drug Absent but Declared for Prescription Verification   Tizanidine                     Not Detected UNEXPECTED    Tizanidine, as indicated in the declared medication list, is not    always detected even when used as directed.   Salicylate                     Not Detected UNEXPECTED    Aspirin, as indicated in the declared medication list, is not    always detected even when used as directed. ==================================================================== Test                      Result    Flag   Units      Ref Range   Creatinine              73               mg/dL      >=20 ==================================================================== Declared Medications:  The flagging and interpretation on this report are based on the  following declared medications.  Unexpected results may arise from  inaccuracies in the declared medications.  **Note: The testing scope of this panel includes these medications:  Metoprolol (Lopressor)  **Note: The testing scope of this panel does not include small to  moderate amounts of these reported  medications:  Aspirin  Tizanidine (Zanaflex)  Zolpidem (Ambien)  **Note: The testing scope of this panel does not include following  reported medications:  Fexofenadine (Allegra)  Omega-3 Fatty Acids (Fish Oil)  Rosuvastatin (Crestor)  Ubiquinone (CoQ10) ==================================================================== For clinical consultation, please call [phone] ====================================================================    UDS interpretation: Compliant          Medication Assessment Form: Reviewed. Patient indicates being compliant with therapy Treatment compliance: Compliant Risk Assessment Profile: Aberrant behavior: See prior evaluations. None observed or detected today Comorbid factors  increasing risk of overdose: See prior notes. No additional risks detected today Risk of substance use disorder (SUD): Low Opioid Risk Tool - 08/26/17 1004      Family History of Substance Abuse   Alcohol  Negative    Illegal Drugs  Negative    Rx Drugs  Negative      Personal History of Substance Abuse   Alcohol  Negative    Illegal Drugs  Negative    Rx Drugs  Negative      Age   Age between 18-45 years   No      History of Preadolescent Sexual Abuse   History of Preadolescent Sexual Abuse  Negative or Male      Psychological Disease   Psychological Disease  Negative    Depression  Negative      Total Score   Opioid Risk Tool Scoring  0    Opioid Risk Interpretation  Low Risk      ORT Scoring interpretation table:  Score <3 = Low Risk for SUD  Score between 4-7 = Moderate Risk for SUD  Score >8 = High Risk for Opioid Abuse   Risk Mitigation Strategies:  Patient Counseling: Covered Patient-Prescriber Agreement (PPA): Present and active  Notification to other healthcare providers: Done  Pharmacologic Plan: No change in therapy, at this time.             Laboratory Chemistry  Inflammation Markers (CRP: Acute Phase) (ESR: Chronic Phase) Lab  Results  Component Value Date   ESRSEDRATE 2 05/12/2017                 Rheumatology Markers Lab Results  Component Value Date   ANA Negative 05/12/2017   LABURIC 7.5 07/08/2017                Renal Function Markers Lab Results  Component Value Date   BUN 27 (H) 02/12/2017   CREATININE 1.29 (H) 02/12/2017   GFRAA 71 02/12/2017   GFRNONAA 62 02/12/2017                 Hepatic Function Markers Lab Results  Component Value Date   AST 36 02/12/2017   ALT 47 (H) 02/12/2017   ALBUMIN 4.8 02/12/2017   ALKPHOS 59 02/12/2017                 Electrolytes Lab Results  Component Value Date   NA 142 02/12/2017   K 4.9 02/12/2017   CL 98 02/12/2017   CALCIUM 10.0 02/12/2017                 Neuropathy Markers Lab Results  Component Value Date   HGBA1C 5.8 (H) 02/12/2017                 Bone Pathology Markers No results found for: VD25OH, [id number], OF7510CH8, NI7782UM3, 25OHVITD1, 25OHVITD2, 25OHVITD3, TESTOFREE, TESTOSTERONE               Coagulation Parameters Lab Results  Component Value Date   PLT 201 07/08/2017                 Cardiovascular Markers Lab Results  Component Value Date   HGB 15.2 07/08/2017   HCT 43.1 07/08/2017                 CA Markers No results found for: CEA, CA125, LABCA2               Note: Lab results reviewed.  Recent Diagnostic Imaging Results  DG Foot Complete Right CLINICAL DATA:  Pain in the great toe for 48 hours. Swelling of the first MTP joint. Concern for gout.  EXAM: RIGHT FOOT COMPLETE - 3+ VIEW  COMPARISON:  None.  FINDINGS: There is no evidence of fracture or dislocation. Minimal spurring along the lateral aspect of the first MTP joint. Otherwise, no significant arthropathy or degenerative disease. Soft tissues are unremarkable. Enthesopathic changes at the insertion of the Achilles tendon.  IMPRESSION: No acute bone abnormality.  No significant arthropathy at the first MTP joint.  Electronically  Signed   By: Markus Daft M.D.   On: 07/08/2017 16:03  Complexity Note: Imaging results reviewed. Results shared with Shawn Crawford, using Layman's terms.                         Meds   Current Outpatient Medications:  .  amLODipine (NORVASC) 5 MG tablet, Take 1 tablet (5 mg total) by mouth daily., Disp: 90 tablet, Rfl: 3 .  aspirin EC 81 MG tablet, Take 81 mg by mouth daily., Disp: , Rfl:  .  COENZYME Q-10 PO, Take 1 capsule by mouth daily. , Disp: , Rfl:  .  fexofenadine (ALLEGRA) 180 MG tablet, Take 180 mg by mouth daily as needed. , Disp: , Rfl:  .  metoprolol tartrate (LOPRESSOR) 25 MG tablet, Take 0.5 tablets (12.5 mg total) by mouth 2 (two) times daily., Disp: 60 tablet, Rfl: 1 .  MULTIPLE VITAMINS PO, Take 1 tablet by mouth daily. , Disp: , Rfl:  .  Omega-3 Fatty Acids (FISH OIL) 1200 MG CAPS, Take 1 capsule by mouth daily. , Disp: , Rfl:  .  rosuvastatin (CRESTOR) 40 MG tablet, Take 1 tablet (40 mg total) by mouth daily., Disp: 90 tablet, Rfl: 3 .  [START ON 12/20/2017] tapentadol (NUCYNTA) 50 MG tablet, Take 1 tablet (50 mg total) by mouth every 8 (eight) hours as needed for severe pain., Disp: 90 tablet, Rfl: 0 .  zolpidem (AMBIEN) 10 MG tablet, Take 1 tablet (10 mg total) by mouth at bedtime as needed for sleep., Disp: 30 tablet, Rfl: 5  ROS  Constitutional: Denies any fever or chills Gastrointestinal: No reported hemesis, hematochezia, vomiting, or acute GI distress Musculoskeletal: Denies any acute onset joint swelling, redness, loss of ROM, or weakness Neurological: No reported episodes of acute onset apraxia, aphasia, dysarthria, agnosia, amnesia, paralysis, loss of coordination, or loss of consciousness  Allergies  Shawn Crawford is allergic to penicillins.  [place]  Drug: Shawn Crawford  reports that he does not use drugs. Alcohol:  reports that he drinks alcohol. Tobacco:  reports that  has never smoked. he has never used smokeless tobacco. Medical:  has a past medical history of  cardiac catheterization, Hyperlipidemia, and Hypertension. Surgical: Shawn Crawford  has a past surgical history that includes Coronary artery bypass graft; Hernia repair; and Appendectomy. Family: family history includes Colon cancer in his sister; Diabetes in his father; Hyperlipidemia in his mother; Hypertension in his father; Thyroid cancer in his sister.  Constitutional Exam  General appearance: Well nourished, well developed, and well hydrated. In no apparent acute distress Vitals:   10/21/17 0914  BP: 138/88  Pulse: 70  Resp: 16  Temp: 98.2 F (36.8 C)  TempSrc: Oral  SpO2: 99%  Weight: 183 lb (83 kg)  Height: '5\' 11"'$  (1.803 m)   BMI Assessment: Estimated body mass index is 25.52 kg/m as calculated from the following:  Height as of this encounter: '5\' 11"'$  (1.803 m).   Weight as of this encounter: 183 lb (83 kg). Psych/Mental status: Alert, oriented x 3 (person, place, & time)       Eyes: PERLA Respiratory: No evidence of acute respiratory distress  Cervical Spine Area Exam  Skin & Axial Inspection: No masses, redness, edema, swelling, or associated skin lesions Alignment: Symmetrical Functional ROM: Unrestricted ROM      Stability: No instability detected Muscle Tone/Strength: Functionally intact. No obvious neuro-muscular anomalies detected. Sensory (Neurological): Unimpaired Palpation: No palpable anomalies              Upper Extremity (UE) Exam    Side: Right upper extremity  Side: Left upper extremity  Skin & Extremity Inspection: Skin color, temperature, and hair growth are WNL. No peripheral edema or cyanosis. No masses, redness, swelling, asymmetry, or associated skin lesions. No contractures.  Skin & Extremity Inspection: Skin color, temperature, and hair growth are WNL. No peripheral edema or cyanosis. No masses, redness, swelling, asymmetry, or associated skin lesions. No contractures.  Functional ROM: Unrestricted ROM          Functional ROM: Unrestricted ROM           Muscle Tone/Strength: Functionally intact. No obvious neuro-muscular anomalies detected.  Muscle Tone/Strength: Functionally intact. No obvious neuro-muscular anomalies detected.  Sensory (Neurological): Unimpaired          Sensory (Neurological): Unimpaired          Palpation: No palpable anomalies              Palpation: No palpable anomalies              Specialized Test(s): Deferred         Specialized Test(s): Deferred          Thoracic Spine Area Exam  Skin & Axial Inspection: No masses, redness, or swelling Alignment: Symmetrical Functional ROM: Unrestricted ROM Stability: No instability detected Muscle Tone/Strength: Functionally intact. No obvious neuro-muscular anomalies detected. Sensory (Neurological): Unimpaired Muscle strength & Tone: No palpable anomalies  Lumbar Spine Area Exam  Skin & Axial Inspection: No masses, redness, or swelling Alignment: Symmetrical Functional ROM: Unrestricted ROM      Stability: No instability detected Muscle Tone/Strength: Functionally intact. No obvious neuro-muscular anomalies detected. Sensory (Neurological): Unimpaired  Palpation: Complains of area being tender to palpation       Provocative Tests: Lumbar Hyperextension and rotation test: Negative       Lumbar Lateral bending test: evaluation deferred today       Patrick's Maneuver: evaluation deferred today                    Gait & Posture Assessment  Ambulation: Unassisted Gait: Relatively normal for age and body habitus Posture: WNL   Lower Extremity Exam    Side: Right lower extremity  Side: Left lower extremity  Skin & Extremity Inspection: Skin color, temperature, and hair growth are WNL. No peripheral edema or cyanosis. No masses, redness, swelling, asymmetry, or associated skin lesions. No contractures.  Skin & Extremity Inspection: Skin color, temperature, and hair growth are WNL. No peripheral edema or cyanosis. No masses, redness, swelling, asymmetry, or associated skin  lesions. No contractures.  Functional ROM: Unrestricted ROM          Functional ROM: Unrestricted ROM          Muscle Tone/Strength: Able to Toe-walk & Heel-walk without problems  Muscle Tone/Strength: Able to Toe-walk & Heel-walk without  problems  Sensory (Neurological): Unimpaired  Sensory (Neurological): Unimpaired  Palpation: No palpable anomalies  Palpation: No palpable anomalies   Assessment  Primary Diagnosis & Pertinent Problem List: The primary encounter diagnosis was Lumbar radiculopathy. Diagnoses of Chronic midline low back pain without sciatica, Lumbar facet arthropathy, and Chronic pain syndrome were also pertinent to this visit.  Status Diagnosis  Controlled Controlled Controlled 1. Lumbar radiculopathy   2. Chronic midline low back pain without sciatica   3. Lumbar facet arthropathy   4. Chronic pain syndrome     Problems updated and reviewed during this visit: Problem  Chronic Pain Syndrome  Chronic Low Back Pain  Lumbar Facet Arthropathy  Lumbar Radiculopathy   Plan of Care  Pharmacotherapy (Medications Ordered): Meds ordered this encounter  Medications  . tapentadol (NUCYNTA) 50 MG tablet    Sig: Take 1 tablet (50 mg total) by mouth every 8 (eight) hours as needed for severe pain.    Dispense:  90 tablet    Refill:  0    For chronic pain: 11/20/17 and 12/20/17 To last for 30 days from fill date    Order Specific Question:   Supervising Provider    Answer:   Milinda Pointer [phone]  . tapentadol (NUCYNTA) 50 MG tablet    Sig: Take 1 tablet (50 mg total) by mouth every 8 (eight) hours as needed for severe pain.    Dispense:  90 tablet    Refill:  0    For chronic pain: 12/20/17 To last for 30 days from fill date    Order Specific Question:   Supervising Provider    Answer:   Milinda Pointer [[id number]]   New Prescriptions   No medications on file   Medications administered today: Jhoel K. Converse "Lennette Bihari" had no medications administered during this  visit. Lab-work, procedure(s), and/or referral(s): No orders of the defined types were placed in this encounter.  Imaging and/or referral(s): None  Interventional therapies: Planned, scheduled, and/or pending:   Not at this time.  Provider-requested follow-up: Return in about 2 months (around 12/19/2017) for MedMgmt with Me Donella Stade Edison Pace).  Future Appointments  Date Time Provider [place]  11/17/2017  2:30 PM Jerrol Banana., MD [facility] None  11/24/2017  8:30 AM Jerrol Banana., MD [facility] None  12/09/2017  9:15 AM Vevelyn Francois, NP [facility] None   Primary Care Physician: Jerrol Banana., MD Location: [facility] Note by: Vevelyn Francois NP Date: 10/21/2017; Time: 8:50 AM  Pain Score Disclaimer: We use the NRS-11 scale. This is a self-reported, subjective measurement of pain severity with only modest accuracy. It is used primarily to identify changes within a particular patient. It must be understood that outpatient pain scales are significantly less accurate that those used for research, where they can be applied under ideal controlled circumstances with minimal exposure to variables. In reality, the score is likely to be a combination of pain intensity and pain affect, where pain affect describes the degree of emotional arousal or changes in action readiness caused by the sensory experience of pain. Factors such as social and work situation, setting, emotional state, anxiety levels, expectation, and prior pain experience may influence pain perception and show large inter-individual differences that may also be affected by time variables.  Patient instructions provided during this appointment: Patient Instructions  _ ____________________________________________________________________________________________  Medication Rules  Applies to: All patients receiving prescriptions (written or electronic).  Pharmacy of record:  Pharmacy where electronic  prescriptions will be sent. If written prescriptions are taken to a different pharmacy, please inform the nursing staff. The pharmacy listed in the electronic medical record should be the one where you would like electronic prescriptions to be sent.  Prescription refills: Only during scheduled appointments. Applies to both, written and electronic prescriptions.  NOTE: The following applies primarily to controlled substances (Opioid* Pain Medications).   Patient's responsibilities: 1. Pain Pills: Bring all pain pills to every appointment (except for procedure appointments). 2. Pill Bottles: Bring pills in original pharmacy bottle. Always bring newest bottle. Bring bottle, even if empty. 3. Medication refills: You are responsible for knowing and keeping track of what medications you need refilled. The day before your appointment, write a list of all prescriptions that need to be refilled. Bring that list to your appointment and give it to the admitting nurse. Prescriptions will be written only during appointments. If you forget a medication, it will not be "Called in", "Faxed", or "electronically sent". You will need to get another appointment to get these prescribed. 4. Prescription Accuracy: You are responsible for carefully inspecting your prescriptions before leaving our office. Have the discharge nurse carefully go over each prescription with you, before taking them home. Make sure that your name is accurately spelled, that your address is correct. Check the name and dose of your medication to make sure it is accurate. Check the number of pills, and the written instructions to make sure they are clear and accurate. Make sure that you are given enough medication to last until your next medication refill appointment. 5. Taking Medication: Take medication as prescribed. Never take more pills than instructed. Never take medication more frequently than prescribed. Taking less pills  or less frequently is permitted and encouraged, when it comes to controlled substances (written prescriptions).  6. Inform other Doctors: Always inform, all of your healthcare providers, of all the medications you take. 7. Pain Medication from other Providers: You are not allowed to accept any additional pain medication from any other Doctor or Healthcare provider. There are two exceptions to this rule. (see below) In the event that you require additional pain medication, you are responsible for notifying us, as stated below. 8. Medication Agreement: You are responsible for carefully reading and following our Medication Agreement. This must be signed before receiving any prescriptions from our practice. Safely store a copy of your signed Agreement. Violations to the Agreement will result in no further prescriptions. (Additional copies of our Medication Agreement are available upon request.) 9. Laws, Rules, & Regulations: All patients are expected to follow all Federal and [employer], Rules, [employer]. Ignorance of the Laws does not constitute a valid excuse. The use of any illegal substances is prohibited. 10. Adopted CDC guidelines & recommendations: Target dosing levels will be at or below 60 MME/day. Use of benzodiazepines** is not recommended.  Exceptions: There are only two exceptions to the rule of not receiving pain medications from other Healthcare Providers. 1. Exception #1 (Emergencies): In the event of an emergency (i.e.: accident requiring emergency care), you are allowed to receive additional pain medication. However, you are responsible for: As soon as you are able, call our office [phone], at any time of the day or night, and leave a message stating your name, the date and nature of the emergency, and the name and dose of the medication prescribed. In the event that your call is answered by a member of our staff, make sure to document and save the  date, time, and the name  of the person that took your information.  2. Exception #2 (Planned Surgery): In the event that you are scheduled by another doctor or dentist to have any type of surgery or procedure, you are allowed (for a period no longer than 30 days), to receive additional pain medication, for the acute post-op pain. However, in this case, you are responsible for picking up a copy of our "Post-op Pain Management for Surgeons" handout, and giving it to your surgeon or dentist. This document is available at our office, and does not require an appointment to obtain it. Simply go to our office during business hours (Monday-Thursday from 8:00 AM to 4:00 PM) (Friday 8:00 AM to 12:00 Noon) or if you have a scheduled appointment with [place], prior to your surgery, and ask for it by name. In addition, you will need to provide [place] with your name, name of your surgeon, type of surgery, and date of procedure or surgery.  *Opioid medications include: morphine, codeine, oxycodone, oxymorphone, hydrocodone, hydromorphone, meperidine, tramadol, tapentadol, buprenorphine, fentanyl, methadone. **Benzodiazepine medications include: diazepam (Valium), alprazolam (Xanax), clonazepam (Klonopine), lorazepam (Ativan), clorazepate (Tranxene), chlordiazepoxide (Librium), estazolam (Prosom), oxazepam (Serax), temazepam (Restoril), triazolam (Halcion)  ____________________________________________________________________________________________

## 2017-10-21 NOTE — Patient Instructions (Addendum)

## 2017-10-22 DIAGNOSIS — M47816 Spondylosis without myelopathy or radiculopathy, lumbar region: Secondary | ICD-10-CM | POA: Insufficient documentation

## 2017-10-22 DIAGNOSIS — G8929 Other chronic pain: Secondary | ICD-10-CM | POA: Insufficient documentation

## 2017-10-22 DIAGNOSIS — M545 Low back pain, unspecified: Secondary | ICD-10-CM | POA: Insufficient documentation

## 2017-10-22 DIAGNOSIS — G894 Chronic pain syndrome: Secondary | ICD-10-CM | POA: Insufficient documentation

## 2017-11-04 ENCOUNTER — Other Ambulatory Visit: Payer: Self-pay | Admitting: Family Medicine

## 2017-11-04 NOTE — Telephone Encounter (Signed)
[facility] faxed a refill request for the following medication. Thanks CC  zolpidem (AMBIEN) 10 MG tablet

## 2017-11-05 MED ORDER — ZOLPIDEM TARTRATE 10 MG PO TABS: 10.0000 mg | ORAL_TABLET | Freq: Every evening | ORAL | 1 refills | Status: DC | PRN

## 2017-11-05 NOTE — Telephone Encounter (Signed)
Faxed Rx for zolpidem (AMBIEN) 10 MG tablet to [facility]. Thanks TNP

## 2017-11-17 ENCOUNTER — Ambulatory Visit (INDEPENDENT_AMBULATORY_CARE_PROVIDER_SITE_OTHER): Payer: 59 | Admitting: Family Medicine

## 2017-11-17 VITALS — BP 118/76 | HR 84 | Temp 98.0°F | Resp 14 | Wt 179.0 lb

## 2017-11-17 DIAGNOSIS — I2510 Atherosclerotic heart disease of native coronary artery without angina pectoris: Secondary | ICD-10-CM | POA: Diagnosis not present

## 2017-11-17 DIAGNOSIS — R001 Bradycardia, unspecified: Secondary | ICD-10-CM | POA: Diagnosis not present

## 2017-11-17 NOTE — Progress Notes (Signed)
Shawn Crawford  MRN: [id number] DOB: 1960/10/03  Subjective:  HPI   The patient is a 57 year old male who presents for follow up of bradycardia.  He was last seen on 10/05/17.  At that time he had a heart rate of 68 in the office but reported it getting as low at 40.  His 12 lead showed sinus rhythm with heart rate of 64.  He was instructed to decrease his Metoprolol to 12.5 mg twice daily from 25 mg twice daily.    Patient also had elevated blood pressure on the last visit and he was started on Amlodipine.    BP Readings from Last 3 Encounters:  11/17/17 118/76  10/21/17 138/88  10/15/17 (!) 152/92   Patient reports he has been checking his heart rate and blood pressure and has been getting readings;  HR 60's-70's and in the high 50's when asleep.  BP 110's-120's over 70's-80's.   When he first started the medicine his BP was low at about 90's over 60's.  He rep[orts that he did have some dizziness with it but then it soon got better.  He has his readings with him today and he has just a couple that had a systolic of 967'E or 938'B.  Patient Active Problem List   Diagnosis Date Noted  . Chronic pain syndrome 10/22/2017  . Chronic low back pain 10/22/2017  . Lumbar facet arthropathy 10/22/2017  . Hyperglycemia 05/30/2015  . Abnormal blood sugar 05/10/2015  . Arteriosclerosis of coronary artery 05/10/2015  . ED (erectile dysfunction) of organic origin 05/10/2015  . Acid reflux 05/10/2015  . HLD (hyperlipidemia) 05/10/2015  . BP (high blood pressure) 05/10/2015  . Lumbar radiculopathy 05/10/2015  . Adiposity 05/10/2015  . Avitaminosis D 05/10/2015  . Benign essential HTN 05/07/2015    Past Medical History:  Diagnosis Date  . Hx of cardiac catheterization   . Hyperlipidemia   . Hypertension     Social History   Socioeconomic History  . Marital status: Single    Spouse name: Not on file  . Number of children: Not on file  . Years of education: Not on file  . Highest  education level: Not on file  Social Needs  . Financial resource strain: Not on file  . Food insecurity - worry: Not on file  . Food insecurity - inability: Not on file  . Transportation needs - medical: Not on file  . Transportation needs - non-medical: Not on file  Occupational History  . Not on file  Tobacco Use  . Smoking status: Never Smoker  . Smokeless tobacco: Never Used  Substance and Sexual Activity  . Alcohol use: Yes    Alcohol/week: 0.0 oz    Comment: glass of wine occasionally  . Drug use: No  . Sexual activity: Not on file  Other Topics Concern  . Not on file  Social History Narrative  . Not on file    Outpatient Encounter Medications as of 11/17/2017  Medication Sig Note  . amLODipine (NORVASC) 5 MG tablet Take 1 tablet (5 mg total) by mouth daily.   Marland Kitchen aspirin EC 81 MG tablet Take 81 mg by mouth daily.   Marland Kitchen COENZYME Q-10 PO Take 1 capsule by mouth daily.  05/10/2015: Received from: [facility]  . fexofenadine (ALLEGRA) 180 MG tablet Take 180 mg by mouth daily as needed.    . metoprolol tartrate (LOPRESSOR) 25 MG tablet Take 0.5 tablets (12.5 mg total) by mouth 2 (  two) times daily.   . MULTIPLE VITAMINS PO Take 1 tablet by mouth daily.  05/10/2015: Received from: [facility]  . Omega-3 Fatty Acids (FISH OIL) 1200 MG CAPS Take 1 capsule by mouth daily.  05/10/2015: Received from: [facility]  . rosuvastatin (CRESTOR) 40 MG tablet Take 1 tablet (40 mg total) by mouth daily.   Derrill Memo ON 12/20/2017] tapentadol (NUCYNTA) 50 MG tablet Take 1 tablet (50 mg total) by mouth every 8 (eight) hours as needed for severe pain.   Marland Kitchen zolpidem (AMBIEN) 10 MG tablet Take 1 tablet (10 mg total) by mouth at bedtime as needed for sleep.    No facility-administered encounter medications on file as of 11/17/2017.     Allergies  Allergen Reactions  . Penicillins Itching    Review of Systems  Constitutional: Negative for fever and  malaise/fatigue.  Respiratory: Negative for cough, shortness of breath and wheezing.   Cardiovascular: Negative for chest pain, palpitations, orthopnea and leg swelling.  Neurological: Negative for dizziness, weakness and headaches.    Objective:  BP 118/76 (BP Location: Right Arm, Patient Position: Sitting, Cuff Size: Normal)   Pulse 84   Temp 98 F (36.7 C) (Oral)   Resp 14   Wt 179 lb (81.2 kg)   BMI 24.97 kg/m   Physical Exam  Constitutional: He is oriented to person, place, and time and well-developed, well-nourished, and in no distress.  HENT:  Head: Normocephalic and atraumatic.  Cardiovascular: Normal rate and regular rhythm.  Pulmonary/Chest: Effort normal and breath sounds normal.  Abdominal: Soft.  Neurological: He is alert and oriented to person, place, and time. Gait normal. GCS score is 15.  Skin: Skin is warm and dry.  Psychiatric: Mood, memory, affect and judgment normal.    Assessment and Plan :  1.Bradycardia Resolved with lower beta blocker dose 2.CAD/s/p CABG  I have done the exam and reviewed the chart and it is accurate to the best of my knowledge. Development worker, community has been used and  any errors in dictation or transcription are unintentional. Miguel Aschoff M.D. [facility]

## 2017-11-23 ENCOUNTER — Ambulatory Visit: Payer: Self-pay | Admitting: Family Medicine

## 2017-11-24 ENCOUNTER — Ambulatory Visit (INDEPENDENT_AMBULATORY_CARE_PROVIDER_SITE_OTHER): Payer: 59 | Admitting: Family Medicine

## 2017-11-24 ENCOUNTER — Encounter: Payer: Self-pay | Admitting: Family Medicine

## 2017-11-24 ENCOUNTER — Encounter: Payer: Self-pay | Admitting: Student in an Organized Health Care Education/Training Program

## 2017-11-24 ENCOUNTER — Ambulatory Visit
Payer: 59 | Attending: Student in an Organized Health Care Education/Training Program | Admitting: Student in an Organized Health Care Education/Training Program

## 2017-11-24 ENCOUNTER — Other Ambulatory Visit: Payer: Self-pay

## 2017-11-24 VITALS — BP 128/78 | HR 70 | Temp 97.7°F | Resp 16 | Ht 71.0 in | Wt 179.0 lb

## 2017-11-24 VITALS — BP 118/78 | HR 86 | Temp 98.1°F | Resp 16 | Ht 71.0 in | Wt 179.0 lb

## 2017-11-24 DIAGNOSIS — M791 Myalgia, unspecified site: Secondary | ICD-10-CM | POA: Diagnosis not present

## 2017-11-24 DIAGNOSIS — Z79899 Other long term (current) drug therapy: Secondary | ICD-10-CM | POA: Diagnosis not present

## 2017-11-24 DIAGNOSIS — R6882 Decreased libido: Secondary | ICD-10-CM | POA: Diagnosis not present

## 2017-11-24 DIAGNOSIS — Z5181 Encounter for therapeutic drug level monitoring: Secondary | ICD-10-CM | POA: Diagnosis present

## 2017-11-24 DIAGNOSIS — I2510 Atherosclerotic heart disease of native coronary artery without angina pectoris: Secondary | ICD-10-CM | POA: Diagnosis not present

## 2017-11-24 DIAGNOSIS — Z808 Family history of malignant neoplasm of other organs or systems: Secondary | ICD-10-CM | POA: Diagnosis not present

## 2017-11-24 DIAGNOSIS — M5416 Radiculopathy, lumbar region: Secondary | ICD-10-CM | POA: Diagnosis not present

## 2017-11-24 DIAGNOSIS — G8929 Other chronic pain: Secondary | ICD-10-CM | POA: Diagnosis not present

## 2017-11-24 DIAGNOSIS — M47816 Spondylosis without myelopathy or radiculopathy, lumbar region: Secondary | ICD-10-CM

## 2017-11-24 DIAGNOSIS — M7918 Myalgia, other site: Secondary | ICD-10-CM

## 2017-11-24 DIAGNOSIS — Z833 Family history of diabetes mellitus: Secondary | ICD-10-CM | POA: Insufficient documentation

## 2017-11-24 DIAGNOSIS — M545 Low back pain: Secondary | ICD-10-CM | POA: Diagnosis not present

## 2017-11-24 DIAGNOSIS — N529 Male erectile dysfunction, unspecified: Secondary | ICD-10-CM | POA: Insufficient documentation

## 2017-11-24 DIAGNOSIS — Z7982 Long term (current) use of aspirin: Secondary | ICD-10-CM | POA: Diagnosis not present

## 2017-11-24 DIAGNOSIS — R5383 Other fatigue: Secondary | ICD-10-CM | POA: Diagnosis not present

## 2017-11-24 DIAGNOSIS — M5116 Intervertebral disc disorders with radiculopathy, lumbar region: Secondary | ICD-10-CM | POA: Insufficient documentation

## 2017-11-24 DIAGNOSIS — Z0000 Encounter for general adult medical examination without abnormal findings: Secondary | ICD-10-CM | POA: Diagnosis not present

## 2017-11-24 DIAGNOSIS — Z125 Encounter for screening for malignant neoplasm of prostate: Secondary | ICD-10-CM | POA: Diagnosis not present

## 2017-11-24 DIAGNOSIS — M5136 Other intervertebral disc degeneration, lumbar region: Secondary | ICD-10-CM | POA: Diagnosis not present

## 2017-11-24 DIAGNOSIS — R739 Hyperglycemia, unspecified: Secondary | ICD-10-CM | POA: Diagnosis not present

## 2017-11-24 DIAGNOSIS — Z6824 Body mass index (BMI) 24.0-24.9, adult: Secondary | ICD-10-CM | POA: Diagnosis not present

## 2017-11-24 DIAGNOSIS — Z1211 Encounter for screening for malignant neoplasm of colon: Secondary | ICD-10-CM | POA: Diagnosis not present

## 2017-11-24 DIAGNOSIS — Z8249 Family history of ischemic heart disease and other diseases of the circulatory system: Secondary | ICD-10-CM | POA: Diagnosis not present

## 2017-11-24 DIAGNOSIS — E669 Obesity, unspecified: Secondary | ICD-10-CM | POA: Insufficient documentation

## 2017-11-24 DIAGNOSIS — K219 Gastro-esophageal reflux disease without esophagitis: Secondary | ICD-10-CM | POA: Insufficient documentation

## 2017-11-24 DIAGNOSIS — Z880 Allergy status to penicillin: Secondary | ICD-10-CM | POA: Insufficient documentation

## 2017-11-24 DIAGNOSIS — G894 Chronic pain syndrome: Secondary | ICD-10-CM | POA: Diagnosis not present

## 2017-11-24 DIAGNOSIS — I10 Essential (primary) hypertension: Secondary | ICD-10-CM | POA: Diagnosis not present

## 2017-11-24 DIAGNOSIS — M51369 Other intervertebral disc degeneration, lumbar region without mention of lumbar back pain or lower extremity pain: Secondary | ICD-10-CM

## 2017-11-24 LAB — POCT URINALYSIS DIPSTICK
BILIRUBIN UA: NEGATIVE
Blood, UA: NEGATIVE
GLUCOSE UA: NEGATIVE
Ketones, UA: NEGATIVE
LEUKOCYTES UA: NEGATIVE
Nitrite, UA: NEGATIVE
PH UA: 6 (ref 5.0–8.0)
Protein, UA: NEGATIVE
SPEC GRAV UA: 1.02 (ref 1.010–1.025)
UROBILINOGEN UA: 0.2 U/dL

## 2017-11-24 LAB — IFOBT (OCCULT BLOOD): IFOBT: NEGATIVE

## 2017-11-24 MED ORDER — HYDROCODONE-ACETAMINOPHEN 5-325 MG PO TABS: 1.0000 | ORAL_TABLET | Freq: Three times a day (TID) | ORAL | 0 refills | Status: DC | PRN

## 2017-11-24 NOTE — Progress Notes (Signed)
Patient's Name: Shawn Crawford  MRN: [id number]  Referring Provider: Jerrol Banana.,*  DOB: 04-26-1961  PCP: Jerrol Banana., MD  DOS: 11/24/2017  Note by: Gillis Santa, MD  Service setting: Ambulatory outpatient  Specialty: Interventional Pain Management  Location: [facility] (AMB) Pain Management Facility    Patient type: Established   Primary Reason(s) for Visit: Encounter for prescription drug management. (Level of risk: moderate)  CC: Back Pain (low)  HPI  Mr. Radziewicz is a 57 y.o. year old, male patient, who comes today for a medication management evaluation. He has Abnormal blood sugar; Arteriosclerosis of coronary artery; ED (erectile dysfunction) of organic origin; Acid reflux; HLD (hyperlipidemia); BP (high blood pressure); Lumbar radiculopathy; Adiposity; Avitaminosis D; Hyperglycemia; Benign essential HTN; Chronic pain syndrome; Chronic low back pain; and Lumbar facet arthropathy on their problem list. His primarily concern today is the Back Pain (low)  Pain Assessment: Location: Lower Back Radiating: radiates to buttcoks Onset: More than a month ago Duration:   Quality: Aching, Constant, Discomfort Severity: 3 /10 (self-reported pain score)  Note: Reported level is compatible with observation.                         When using our objective Pain Scale, levels between 6 and 10/10 are said to belong in an emergency room, as it progressively worsens from a 6/10, described as severely limiting, requiring emergency care not usually available at an outpatient pain management facility. At a 6/10 level, communication becomes difficult and requires great effort. Assistance to reach the emergency department may be required. Facial flushing and profuse sweating along with potentially dangerous increases in heart rate and blood pressure will be evident. Effect on ADL:   Timing: Constant Modifying factors: laying on side with pillow  Mr. Huckins was last scheduled for an appointment on  09/18/2017 for medication management. During today's appointment we reviewed Mr. Alexa chronic pain status, as well as his outpatient medication regimen.  Patient returns today for medication management to discuss changes with Nucynta.  Patient states that he is taking Nucynta 50 mg twice daily to 3 times daily as needed.  Patient states that the medication becomes less effective after about 3-4 hours and that he feels "ill".  He is wondering if there are any other alternatives that we could try.  The patient  reports that he does not use drugs. His body mass index is 24.97 kg/m.  Further details on both, my assessment(s), as well as the proposed treatment plan, please see below.  Controlled Substance Pharmacotherapy Assessment REMS (Risk Evaluation and Mitigation Strategy)  Analgesic: Nucynta 50 mg 3 times daily as needed.  Will transition to hydrocodone 5 mg 3 times daily as needed MME/day: 15 mg/day.  Dewayne Shorter, RN  11/24/2017  1:48 PM  Signed Safety precautions to be maintained throughout the outpatient stay will include: orient to surroundings, keep bed in low position, maintain call bell within reach at all times, provide assistance with transfer out of bed and ambulation. Pharmacokinetics: Liberation and absorption (onset of action): WNL Distribution (time to peak effect): WNL Metabolism and excretion (duration of action): WNL         Pharmacodynamics: Desired effects: Analgesia: Mr. Yoho reports >50% benefit. Functional ability: Patient reports that medication allows him to accomplish basic ADLs Clinically meaningful improvement in function (CMIF): Sustained CMIF goals met Perceived effectiveness: Described as relatively effective, allowing for increase in activities of daily living (ADL) Undesirable effects:  Side-effects or Adverse reactions: None reported Monitoring: [place] PMP: Online review of the past 45-monthperiod conducted. Compliant with practice rules and  regulations Last UDS on record: Summary  Date Value Ref Range Status  07/07/2017 FINAL  Corrected    Comment:    ==================================================================== [facility] COMP DRUG ANALYSIS,UR ==================================================================== Test                             Result       Flag       Units Drug Present and Declared for Prescription Verification   Zolpidem                       PRESENT      EXPECTED   Zolpidem Acid                  PRESENT      EXPECTED    Zolpidem acid is an expected metabolite of zolpidem.   Metoprolol                     PRESENT      EXPECTED Drug Present not Declared for Prescription Verification   Naproxen                       PRESENT      UNEXPECTED Drug Absent but Declared for Prescription Verification   Tizanidine                     Not Detected UNEXPECTED    Tizanidine, as indicated in the declared medication list, is not    always detected even when used as directed.   Salicylate                     Not Detected UNEXPECTED    Aspirin, as indicated in the declared medication list, is not    always detected even when used as directed. ==================================================================== Test                      Result    Flag   Units      Ref Range   Creatinine              73               mg/dL      >=20 ==================================================================== Declared Medications:  The flagging and interpretation on this report are based on the  following declared medications.  Unexpected results may arise from  inaccuracies in the declared medications.  **Note: The testing scope of this panel includes these medications:  Metoprolol (Lopressor)  **Note: The testing scope of this panel does not include small to  moderate amounts of these reported medications:  Aspirin  Tizanidine (Zanaflex)  Zolpidem (Ambien)  **Note: The testing scope of this panel does not include  following  reported medications:  Fexofenadine (Allegra)  Omega-3 Fatty Acids (Fish Oil)  Rosuvastatin (Crestor)  Ubiquinone (CoQ10) ==================================================================== For clinical consultation, please call [phone] ====================================================================    UDS interpretation: Compliant          Medication Assessment Form: Reviewed. Patient indicates being compliant with therapy Treatment compliance: Compliant Risk Assessment Profile: Aberrant behavior: See prior evaluations. None observed or detected today Comorbid factors increasing risk of overdose: See prior notes. No additional risks detected today Risk of substance use disorder (SUD): Low  Opioid Risk Tool - 11/24/17 1347      Family History of Substance Abuse   Alcohol  Negative    Illegal Drugs  Negative    Rx Drugs  Negative      Personal History of Substance Abuse   Alcohol  Negative    Illegal Drugs  Negative    Rx Drugs  Negative      Age   Age between 61-45 years   No      History of Preadolescent Sexual Abuse   History of Preadolescent Sexual Abuse  Negative or Male      Psychological Disease   Psychological Disease  Negative    Depression  Negative      Total Score   Opioid Risk Tool Scoring  0    Opioid Risk Interpretation  Low Risk      ORT Scoring interpretation table:  Score <3 = Low Risk for SUD  Score between 4-7 = Moderate Risk for SUD  Score >8 = High Risk for Opioid Abuse   Risk Mitigation Strategies:  Patient Counseling: Covered Patient-Prescriber Agreement (PPA): Present and active  Notification to other healthcare providers: Done  Pharmacologic Plan: Discontinue Nucynta.  Patient's 2 prior prescriptions were discarded.  Prescription for hydrocodone 5 mg 3 times daily as needed for moderate to severe pain.             Laboratory Chemistry  Inflammation Markers (CRP: Acute Phase) (ESR: Chronic Phase) Lab Results   Component Value Date   ESRSEDRATE 2 05/12/2017                         Rheumatology Markers Lab Results  Component Value Date   ANA Negative 05/12/2017   LABURIC 7.5 07/08/2017                Renal Function Markers Lab Results  Component Value Date   BUN 27 (H) 02/12/2017   CREATININE 1.29 (H) 02/12/2017   GFRAA 71 02/12/2017   GFRNONAA 62 02/12/2017                 Hepatic Function Markers Lab Results  Component Value Date   AST 36 02/12/2017   ALT 47 (H) 02/12/2017   ALBUMIN 4.8 02/12/2017   ALKPHOS 59 02/12/2017                 Electrolytes Lab Results  Component Value Date   NA 142 02/12/2017   K 4.9 02/12/2017   CL 98 02/12/2017   CALCIUM 10.0 02/12/2017                        Neuropathy Markers Lab Results  Component Value Date   HGBA1C 5.8 (H) 02/12/2017                 Bone Pathology Markers No results found for: VD25OH, [id number], YK5993TT0, VX7939QZ0, 25OHVITD1, 25OHVITD2, 25OHVITD3, TESTOFREE, TESTOSTERONE                       Coagulation Parameters Lab Results  Component Value Date   PLT 201 07/08/2017                 Cardiovascular Markers Lab Results  Component Value Date   HGB 15.2 07/08/2017   HCT 43.1 07/08/2017                 CA Markers No results found for:  CEA, CA125, LABCA2               Note: Lab results reviewed.  Recent Diagnostic Imaging Results  DG Foot Complete Right CLINICAL DATA:  Pain in the great toe for 48 hours. Swelling of the first MTP joint. Concern for gout.  EXAM: RIGHT FOOT COMPLETE - 3+ VIEW  COMPARISON:  None.  FINDINGS: There is no evidence of fracture or dislocation. Minimal spurring along the lateral aspect of the first MTP joint. Otherwise, no significant arthropathy or degenerative disease. Soft tissues are unremarkable. Enthesopathic changes at the insertion of the Achilles tendon.  IMPRESSION: No acute bone abnormality.  No significant arthropathy at the first MTP  joint.  Electronically Signed   By: Markus Daft M.D.   On: 07/08/2017 16:03  Complexity Note: Imaging results reviewed. Results shared with Mr. Dieppa, using Layman's terms.                         Meds   Current Outpatient Medications:  .  amLODipine (NORVASC) 5 MG tablet, Take 1 tablet (5 mg total) by mouth daily., Disp: 90 tablet, Rfl: 3 .  aspirin EC 81 MG tablet, Take 81 mg by mouth daily., Disp: , Rfl:  .  COENZYME Q-10 PO, Take 1 capsule by mouth daily. , Disp: , Rfl:  .  fexofenadine (ALLEGRA) 180 MG tablet, Take 180 mg by mouth daily as needed. , Disp: , Rfl:  .  metoprolol tartrate (LOPRESSOR) 25 MG tablet, Take 0.5 tablets (12.5 mg total) by mouth 2 (two) times daily., Disp: 60 tablet, Rfl: 1 .  MULTIPLE VITAMINS PO, Take 1 tablet by mouth daily. , Disp: , Rfl:  .  Omega-3 Fatty Acids (FISH OIL) 1200 MG CAPS, Take 1 capsule by mouth daily. , Disp: , Rfl:  .  rosuvastatin (CRESTOR) 40 MG tablet, Take 1 tablet (40 mg total) by mouth daily., Disp: 90 tablet, Rfl: 3 .  zolpidem (AMBIEN) 10 MG tablet, Take 1 tablet (10 mg total) by mouth at bedtime as needed for sleep., Disp: 90 tablet, Rfl: 1 .  HYDROcodone-acetaminophen (NORCO/VICODIN) 5-325 MG tablet, Take 1 tablet by mouth 3 (three) times daily as needed for moderate pain., Disp: 90 tablet, Rfl: 0  ROS  Constitutional: Denies any fever or chills Gastrointestinal: No reported hemesis, hematochezia, vomiting, or acute GI distress Musculoskeletal: Denies any acute onset joint swelling, redness, loss of ROM, or weakness Neurological: No reported episodes of acute onset apraxia, aphasia, dysarthria, agnosia, amnesia, paralysis, loss of coordination, or loss of consciousness  Allergies  Mr. Domanski is allergic to penicillins.  [place]  Drug: Mr. Morandi  reports that he does not use drugs. Alcohol:  reports that he drinks alcohol. Tobacco:  reports that  has never smoked. he has never used smokeless tobacco. Medical:  has a past medical  history of cardiac catheterization, Hyperlipidemia, and Hypertension. Surgical: Mr. Corvino  has a past surgical history that includes Coronary artery bypass graft; Hernia repair; and Appendectomy. Family: family history includes Colon cancer in his sister; Diabetes in his father; Hyperlipidemia in his mother; Hypertension in his father; Thyroid cancer in his sister.  Constitutional Exam  General appearance: Well nourished, well developed, and well hydrated. In no apparent acute distress Vitals:   11/24/17 1342 11/24/17 1344  BP:  128/78  Pulse:  70  Resp:  16  Temp:  97.7 F (36.5 C)  SpO2:  100%  Weight: 179 lb (81.2 kg)  Height: '5\' 11"'$  (1.803 m)    BMI Assessment: Estimated body mass index is 24.97 kg/m as calculated from the following:   Height as of this encounter: '5\' 11"'$  (1.803 m).   Weight as of this encounter: 179 lb (81.2 kg).  BMI interpretation table: BMI level Category Range association with higher incidence of chronic pain  <18 kg/m2 Underweight   18.5-24.9 kg/m2 Ideal body weight   25-29.9 kg/m2 Overweight Increased incidence by 20%  30-34.9 kg/m2 Obese (Class I) Increased incidence by 68%  35-39.9 kg/m2 Severe obesity (Class II) Increased incidence by 136%  >40 kg/m2 Extreme obesity (Class III) Increased incidence by 254%   BMI Readings from Last 4 Encounters:  11/24/17 24.97 kg/m  11/24/17 24.97 kg/m  11/17/17 24.97 kg/m  10/21/17 25.52 kg/m   Wt Readings from Last 4 Encounters:  11/24/17 179 lb (81.2 kg)  11/24/17 179 lb (81.2 kg)  11/17/17 179 lb (81.2 kg)  10/21/17 183 lb (83 kg)  Psych/Mental status: Alert, oriented x 3 (person, place, & time)       Eyes: PERLA Respiratory: No evidence of acute respiratory distress  Cervical Spine Area Exam  Skin & Axial Inspection: No masses, redness, edema, swelling, or associated skin lesions Alignment: Symmetrical Functional ROM: Unrestricted ROM      Stability: No instability detected Muscle Tone/Strength:  Functionally intact. No obvious neuro-muscular anomalies detected. Sensory (Neurological): Unimpaired Palpation: No palpable anomalies              Upper Extremity (UE) Exam    Side: Right upper extremity  Side: Left upper extremity  Skin & Extremity Inspection: Skin color, temperature, and hair growth are WNL. No peripheral edema or cyanosis. No masses, redness, swelling, asymmetry, or associated skin lesions. No contractures.  Skin & Extremity Inspection: Skin color, temperature, and hair growth are WNL. No peripheral edema or cyanosis. No masses, redness, swelling, asymmetry, or associated skin lesions. No contractures.  Functional ROM: Unrestricted ROM          Functional ROM: Unrestricted ROM          Muscle Tone/Strength: Functionally intact. No obvious neuro-muscular anomalies detected.  Muscle Tone/Strength: Functionally intact. No obvious neuro-muscular anomalies detected.  Sensory (Neurological): Unimpaired          Sensory (Neurological): Unimpaired          Palpation: No palpable anomalies              Palpation: No palpable anomalies              Specialized Test(s): Deferred         Specialized Test(s): Deferred          Thoracic Spine Area Exam  Skin & Axial Inspection: No masses, redness, or swelling Alignment: Symmetrical Functional ROM: Unrestricted ROM Stability: No instability detected Muscle Tone/Strength: Functionally intact. No obvious neuro-muscular anomalies detected. Sensory (Neurological): Unimpaired Muscle strength & Tone: No palpable anomalies  Lumbar Spine Area Exam  Skin & Axial Inspection: No masses, redness, or swelling Alignment: Symmetrical Functional ROM: Unrestricted ROM      Stability: No instability detected Muscle Tone/Strength: Functionally intact. No obvious neuro-muscular anomalies detected. Sensory (Neurological): Unimpaired Palpation: No palpable anomalies       Provocative Tests: Lumbar Hyperextension and rotation test: evaluation deferred  today       Lumbar Lateral bending test: evaluation deferred today       Patrick's Maneuver: evaluation deferred today  Gait & Posture Assessment  Ambulation: Unassisted Gait: Relatively normal for age and body habitus Posture: WNL   Lower Extremity Exam    Side: Right lower extremity  Side: Left lower extremity  Skin & Extremity Inspection: Skin color, temperature, and hair growth are WNL. No peripheral edema or cyanosis. No masses, redness, swelling, asymmetry, or associated skin lesions. No contractures.  Skin & Extremity Inspection: Skin color, temperature, and hair growth are WNL. No peripheral edema or cyanosis. No masses, redness, swelling, asymmetry, or associated skin lesions. No contractures.  Functional ROM: Unrestricted ROM          Functional ROM: Unrestricted ROM          Muscle Tone/Strength: Functionally intact. No obvious neuro-muscular anomalies detected.  Muscle Tone/Strength: Functionally intact. No obvious neuro-muscular anomalies detected.  Sensory (Neurological): Unimpaired  Sensory (Neurological): Unimpaired  Palpation: No palpable anomalies  Palpation: No palpable anomalies   Assessment  Primary Diagnosis & Pertinent Problem List: The primary encounter diagnosis was Lumbar radiculopathy. Diagnoses of Chronic midline low back pain without sciatica, Lumbar facet arthropathy, Chronic pain syndrome, Lumbar degenerative disc disease, and Piriformis muscle pain were also pertinent to this visit.  Status Diagnosis  Persistent Persistent Persistent 1. Lumbar radiculopathy   2. Chronic midline low back pain without sciatica   3. Lumbar facet arthropathy   4. Chronic pain syndrome   5. Lumbar degenerative disc disease   6. Piriformis muscle pain      57 year old male with axial low back pain that radiates into bilateral buttocks and posterior thighs, onset and duration approximately 3 years. Patient's lumbar MRI is significant for lumbar spondylosis,  facet arthropathy and L3-L4 disc bulge. Patient has had 2 diagnostic lumbar medial branch nerve blocks which were not effective. He has undergone physical therapy and did not obtain any significant benefit. Patient returns today for medication management to discuss changes with Nucynta.  Patient states that he is taking Nucynta 50 mg twice daily to 3 times daily as needed.  Patient states that the medication becomes less effective after about 3-4 hours and that he feels "ill".  Will have patient discontinue his Nucynta.  He brought his 2 prior prescriptions in which were discarded.  Patient to start hydrocodone 5 mg 3 times daily as needed for moderate to severe pain. Rx provided for 2 months.     Plan of Care  Pharmacotherapy (Medications Ordered): Meds ordered this encounter  Medications  . DISCONTD: HYDROcodone-acetaminophen (NORCO/VICODIN) 5-325 MG tablet    Sig: Take 1 tablet by mouth 3 (three) times daily as needed for moderate pain.    Dispense:  90 tablet    Refill:  0    Do not place this medication, or any other prescription from our practice, on "Automatic Refill". Patient may have prescription filled one day early if pharmacy is closed on scheduled refill date.  For chronic pain To fill on or after: 11/24/17, 12/24/17  . HYDROcodone-acetaminophen (NORCO/VICODIN) 5-325 MG tablet    Sig: Take 1 tablet by mouth 3 (three) times daily as needed for moderate pain.    Dispense:  90 tablet    Refill:  0    Do not place this medication, or any other prescription from our practice, on "Automatic Refill". Patient may have prescription filled one day early if pharmacy is closed on scheduled refill date.  For chronic pain To fill on or after: 11/24/17, 12/24/17   Provider-requested follow-up: Return in about 8 weeks (around 01/19/2018) for Medication Management.  Time Note: Greater than 50% of the 25 minute(s) of face-to-face time spent with Mr. Wilensky, was spent in counseling/coordination of  care regarding: the treatment plan, treatment alternatives, the opioid analgesic risks and possible complications, the appropriate use of his medications, realistic expectations, the medication agreement and the patient's responsibilities when it comes to controlled substances. Future Appointments  Date Time Provider [place]  01/20/2018  8:45 AM Gillis Santa, MD [facility] None  05/25/2018  8:20 AM Jerrol Banana., MD [facility] None    Primary Care Physician: Jerrol Banana., MD Location: [facility] Note by: Gillis Santa, M.D Date: 11/24/2017; Time: 2:13 PM  There are no Patient Instructions on file for this visit.

## 2017-11-24 NOTE — Progress Notes (Signed)
Patient: Shawn Crawford, Male    DOB: Jul 01, 1961, 57 y.o.   MRN: [id number] Visit Date: 11/24/2017  Today's Provider: [name], MD   Chief Complaint  Patient presents with  . Hypertension  . Hyperlipidemia   Subjective:    Annual physical exam Shawn Crawford is a 57 y.o. male who presents today for health maintenance and complete physical. He feels well. He reports exercising, walking when he can. He reports he is sleeping well, with the help of ambien.  -----------------------------------------------------------------  Colonoscopy- 11/07/10 Dr. Minna Merritts internal hemorrhoids, otherwise normal   Review of Systems  Constitutional: Positive for fatigue.  HENT: Negative.   Eyes: Negative.   Respiratory: Negative.   Cardiovascular: Negative.   Gastrointestinal: Negative.   Endocrine: Negative.   Genitourinary: Negative.        Decreased libido.  Musculoskeletal: Negative.   Skin: Negative.   Allergic/Immunologic: Negative.   Neurological: Negative.   Hematological: Negative.   Psychiatric/Behavioral: Negative.     Social History      He  reports that  has never smoked. he has never used smokeless tobacco. He reports that he drinks alcohol. He reports that he does not use drugs.       Social History   Socioeconomic History  . Marital status: Single    Spouse name: None  . Number of children: None  . Years of education: None  . Highest education level: None  Social Needs  . Financial resource strain: None  . Food insecurity - worry: None  . Food insecurity - inability: None  . Transportation needs - medical: None  . Transportation needs - non-medical: None  Occupational History  . None  Tobacco Use  . Smoking status: Never Smoker  . Smokeless tobacco: Never Used  Substance and Sexual Activity  . Alcohol use: Yes    Alcohol/week: 0.0 oz    Comment: glass of wine occasionally  . Drug use: No  . Sexual activity: None  Other Topics Concern   . None  Social History Narrative  . None    Past Medical History:  Diagnosis Date  . Hx of cardiac catheterization   . Hyperlipidemia   . Hypertension      Patient Active Problem List   Diagnosis Date Noted  . Chronic pain syndrome 10/22/2017  . Chronic low back pain 10/22/2017  . Lumbar facet arthropathy 10/22/2017  . Hyperglycemia 05/30/2015  . Abnormal blood sugar 05/10/2015  . Arteriosclerosis of coronary artery 05/10/2015  . ED (erectile dysfunction) of organic origin 05/10/2015  . Acid reflux 05/10/2015  . HLD (hyperlipidemia) 05/10/2015  . BP (high blood pressure) 05/10/2015  . Lumbar radiculopathy 05/10/2015  . Adiposity 05/10/2015  . Avitaminosis D 05/10/2015  . Benign essential HTN 05/07/2015    Past Surgical History:  Procedure Laterality Date  . APPENDECTOMY    . CORONARY ARTERY BYPASS GRAFT     x 3  . HERNIA REPAIR      Family History        Family Status  Relation Name Status  . Mother  Alive  . Father  Deceased  . Sister  Alive  . Son  Alive  . Sister  Alive  . Son  Alive        His family history includes Colon cancer in his sister; Diabetes in his father; Hyperlipidemia in his mother; Hypertension in his father; Thyroid cancer in his sister.      Allergies  Allergen  Reactions  . Penicillins Itching     Current Outpatient Medications:  .  amLODipine (NORVASC) 5 MG tablet, Take 1 tablet (5 mg total) by mouth daily., Disp: 90 tablet, Rfl: 3 .  aspirin EC 81 MG tablet, Take 81 mg by mouth daily., Disp: , Rfl:  .  COENZYME Q-10 PO, Take 1 capsule by mouth daily. , Disp: , Rfl:  .  metoprolol tartrate (LOPRESSOR) 25 MG tablet, Take 0.5 tablets (12.5 mg total) by mouth 2 (two) times daily., Disp: 60 tablet, Rfl: 1 .  MULTIPLE VITAMINS PO, Take 1 tablet by mouth daily. , Disp: , Rfl:  .  Omega-3 Fatty Acids (FISH OIL) 1200 MG CAPS, Take 1 capsule by mouth daily. , Disp: , Rfl:  .  rosuvastatin (CRESTOR) 40 MG tablet, Take 1 tablet (40 mg  total) by mouth daily., Disp: 90 tablet, Rfl: 3 .  [START ON 12/20/2017] tapentadol (NUCYNTA) 50 MG tablet, Take 1 tablet (50 mg total) by mouth every 8 (eight) hours as needed for severe pain., Disp: 90 tablet, Rfl: 0 .  zolpidem (AMBIEN) 10 MG tablet, Take 1 tablet (10 mg total) by mouth at bedtime as needed for sleep., Disp: 90 tablet, Rfl: 1 .  fexofenadine (ALLEGRA) 180 MG tablet, Take 180 mg by mouth daily as needed. , Disp: , Rfl:    Patient Care Team: Jerrol Banana., MD as PCP - General (Family Medicine)      Objective:   Vitals: BP 118/78 (BP Location: Left Arm, Patient Position: Sitting, Cuff Size: Normal)   Pulse 86   Temp 98.1 F (36.7 C) (Oral)   Resp 16   Ht 5\' 11"  (1.803 m)   Wt 179 lb (81.2 kg)   BMI 24.97 kg/m    Vitals:   11/24/17 0828  BP: 118/78  Pulse: 86  Resp: 16  Temp: 98.1 F (36.7 C)  TempSrc: Oral  Weight: 179 lb (81.2 kg)  Height: 5\' 11"  (1.803 m)     Physical Exam  Constitutional: He is oriented to person, place, and time. He appears well-developed and well-nourished.  HENT:  Head: Normocephalic and atraumatic.  Right Ear: External ear normal.  Left Ear: External ear normal.  Nose: Nose normal.  Mouth/Throat: Oropharynx is clear and moist.  Eyes: No scleral icterus.  Neck: No thyromegaly present.  Cardiovascular: Normal rate, regular rhythm, normal heart sounds and intact distal pulses.  Pulmonary/Chest: Effort normal and breath sounds normal.  Abdominal: Soft. Bowel sounds are normal.  Genitourinary: Rectum normal and penis normal.  Musculoskeletal: Normal range of motion.  Lymphadenopathy:    He has no cervical adenopathy.  Neurological: He is alert and oriented to person, place, and time.  Skin: Skin is warm and dry.  Psychiatric: He has a normal mood and affect. His behavior is normal. Judgment and thought content normal.     Depression Screen PHQ 2/9 Scores 08/26/2017 08/06/2017 07/07/2017 02/11/2017  PHQ - 2 Score 0 0 0  0  PHQ- 9 Score - - - 2      Assessment & Plan:     Routine Health Maintenance and Physical Exam  Exercise Activities and Dietary recommendations Goals    None      Immunization History  Administered Date(s) Administered  . Influenza,inj,Quad PF,6+ Mos 05/30/2015, 06/16/2016, 05/27/2017  . Pneumococcal Conjugate-13 08/21/2015  . Tdap 12/04/2008    Health Maintenance  Topic Date Due  . PNEUMOCOCCAL POLYSACCHARIDE VACCINE (1) 05/17/1962  . FOOT EXAM  05/17/1970  .  OPHTHALMOLOGY EXAM  05/17/1970  . URINE MICROALBUMIN  05/17/1970  . HIV Screening  05/18/1975  . HEMOGLOBIN A1C  08/15/2017  . TETANUS/TDAP  12/05/2018  . COLONOSCOPY  10/25/2020  . INFLUENZA VACCINE  Completed  . Hepatitis C Screening  Completed     Discussed health benefits of physical activity, and encouraged him to engage in regular exercise appropriate for his age and condition.  AKs Per Dr Nehemiah Massed. Chronic Back Pain Per Dr Holley Raring. S/pCABG FH of AAA in Father/PaternalGF Aortic US.   --------------------------------------------------------------------   I have done the exam and reviewed the above chart and it is accurate to the best of my knowledge. Development worker, community has been used in this note in any air is in the dictation or transcription are unintentional.  [name], MD  [facility]

## 2017-11-24 NOTE — Progress Notes (Signed)
Safety precautions to be maintained throughout the outpatient stay will include: orient to surroundings, keep bed in low position, maintain call bell within reach at all times, provide assistance with transfer out of bed and ambulation.  

## 2017-11-24 NOTE — Progress Notes (Deleted)
Patient: Shawn Crawford Male    DOB: 26-Dec-1960   57 y.o.   MRN: [id number] Visit Date: 11/24/2017  Today's Provider: [name], MD   No chief complaint on file.  Subjective:    HPI  Hypertension, follow-up:  BP Readings from Last 3 Encounters:  11/17/17 118/76  10/21/17 138/88  10/15/17 (!) 152/92    He was last seen for hypertension {NUMBERS 1-12:18279} {days/wks/mos/yrs:310907} ago.  BP at that visit was ***. Management since that visit includes ***. He reports {excellent/good/fair/poor:19665} compliance with treatment. He {ACTION; IS/IS [id number]} having side effects. *** He {is/is not:9024} exercising. He {is/is not:9024} adherent to low salt diet.   Outside blood pressures are ***. He is experiencing {Symptoms; cardiac:12860}.  Patient denies {Symptoms; cardiac:12860}.   Cardiovascular risk factors include {cv risk factors:510}.  Use of agents associated with hypertension: {bp agents assoc with hypertension:511::"none"}.     Weight trend: {trend:16658} Wt Readings from Last 3 Encounters:  11/17/17 179 lb (81.2 kg)  10/21/17 183 lb (83 kg)  10/15/17 184 lb (83.5 kg)    Current diet: {diet habits:16563}  ------------------------------------------------------------------------   Lipid/Cholesterol, Follow-up:   Last seen for this{1-12:18279} {days/wks/mos/yrs:310907} ago.  Management changes since that visit include ***. Marland Kitchen Last Lipid Panel:    Component Value Date/Time   CHOL 168 02/12/2017 0820   TRIG 186 (H) 02/12/2017 0820   HDL 41 02/12/2017 0820   LDLCALC 90 02/12/2017 0820    Risk factors for vascular disease include {risk factors atherosclerosis:10337}  He reports {excellent/good/fair/poor:19665} compliance with treatment. He {ACTION; IS/IS [id number]} having side effects.  Current symptoms include {Symptoms; diabetes:14075} and have been {Desc; course:15616}. Weight trend: {trend:16658} Prior visit with dietician:  {yes/no:17258} Current diet: {diet habits:16563} Current exercise: {exercise types:16438}  Wt Readings from Last 3 Encounters:  11/17/17 179 lb (81.2 kg)  10/21/17 183 lb (83 kg)  10/15/17 184 lb (83.5 kg)    -------------------------------------------------------------------      Allergies  Allergen Reactions  . Penicillins Itching     Current Outpatient Medications:  .  amLODipine (NORVASC) 5 MG tablet, Take 1 tablet (5 mg total) by mouth daily., Disp: 90 tablet, Rfl: 3 .  aspirin EC 81 MG tablet, Take 81 mg by mouth daily., Disp: , Rfl:  .  COENZYME Q-10 PO, Take 1 capsule by mouth daily. , Disp: , Rfl:  .  fexofenadine (ALLEGRA) 180 MG tablet, Take 180 mg by mouth daily as needed. , Disp: , Rfl:  .  metoprolol tartrate (LOPRESSOR) 25 MG tablet, Take 0.5 tablets (12.5 mg total) by mouth 2 (two) times daily., Disp: 60 tablet, Rfl: 1 .  MULTIPLE VITAMINS PO, Take 1 tablet by mouth daily. , Disp: , Rfl:  .  Omega-3 Fatty Acids (FISH OIL) 1200 MG CAPS, Take 1 capsule by mouth daily. , Disp: , Rfl:  .  rosuvastatin (CRESTOR) 40 MG tablet, Take 1 tablet (40 mg total) by mouth daily., Disp: 90 tablet, Rfl: 3 .  [START ON 12/20/2017] tapentadol (NUCYNTA) 50 MG tablet, Take 1 tablet (50 mg total) by mouth every 8 (eight) hours as needed for severe pain., Disp: 90 tablet, Rfl: 0 .  zolpidem (AMBIEN) 10 MG tablet, Take 1 tablet (10 mg total) by mouth at bedtime as needed for sleep., Disp: 90 tablet, Rfl: 1  Review of Systems  Constitutional: Negative.   HENT: Negative.   Eyes: Negative.   Respiratory: Negative.   Cardiovascular: Negative.   Gastrointestinal: Negative.   Endocrine:  Negative.   Genitourinary: Negative.   Musculoskeletal: Negative.   Skin: Negative.   Allergic/Immunologic: Negative.   Neurological: Negative.   Hematological: Negative.   Psychiatric/Behavioral: Negative.     Social History   Tobacco Use  . Smoking status: Never Smoker  . Smokeless tobacco:  Never Used  Substance Use Topics  . Alcohol use: Yes    Alcohol/week: 0.0 oz    Comment: glass of wine occasionally   Objective:   There were no vitals taken for this visit. There were no vitals filed for this visit.   Physical Exam      Assessment & Plan:           [name], MD  [facility]

## 2017-11-25 DIAGNOSIS — Z125 Encounter for screening for malignant neoplasm of prostate: Secondary | ICD-10-CM | POA: Diagnosis not present

## 2017-11-25 DIAGNOSIS — R5383 Other fatigue: Secondary | ICD-10-CM | POA: Diagnosis not present

## 2017-11-25 DIAGNOSIS — Z0000 Encounter for general adult medical examination without abnormal findings: Secondary | ICD-10-CM | POA: Diagnosis not present

## 2017-11-26 LAB — COMPREHENSIVE METABOLIC PANEL
A/G RATIO: 2.3 — AB (ref 1.2–2.2)
ALBUMIN: 5 g/dL (ref 3.5–5.5)
ALT: 44 IU/L (ref 0–44)
AST: 30 IU/L (ref 0–40)
Alkaline Phosphatase: 55 IU/L (ref 39–117)
BUN / CREAT RATIO: 9 (ref 9–20)
BUN: 9 mg/dL (ref 6–24)
Bilirubin Total: 0.4 mg/dL (ref 0.0–1.2)
CO2: 24 mmol/L (ref 20–29)
CREATININE: 1.04 mg/dL (ref 0.76–1.27)
Calcium: 10.1 mg/dL (ref 8.7–10.2)
Chloride: 102 mmol/L (ref 96–106)
GFR calc Af Amer: 92 mL/min/{1.73_m2} (ref >59)
GFR, EST NON AFRICAN AMERICAN: 79 mL/min/{1.73_m2} (ref >59)
GLOBULIN, TOTAL: 2.2 g/dL (ref 1.5–4.5)
Glucose: 113 mg/dL — ABNORMAL HIGH (ref 65–99)
POTASSIUM: 4.4 mmol/L (ref 3.5–5.2)
SODIUM: 141 mmol/L (ref 134–144)
Total Protein: 7.2 g/dL (ref 6.0–8.5)

## 2017-11-26 LAB — CBC WITH DIFFERENTIAL/PLATELET
BASOS: 0 %
Basophils Absolute: 0 10*3/uL (ref 0.0–0.2)
EOS (ABSOLUTE): 0.2 10*3/uL (ref 0.0–0.4)
EOS: 3 %
HEMATOCRIT: 41.5 % (ref 37.5–51.0)
HEMOGLOBIN: 14.8 g/dL (ref 13.0–17.7)
IMMATURE GRANS (ABS): 0 10*3/uL (ref 0.0–0.1)
IMMATURE GRANULOCYTES: 0 %
LYMPHS: 17 %
Lymphocytes Absolute: 1 10*3/uL (ref 0.7–3.1)
MCH: 30.5 pg (ref 26.6–33.0)
MCHC: 35.7 g/dL (ref 31.5–35.7)
MCV: 86 fL (ref 79–97)
MONOCYTES: 9 %
Monocytes Absolute: 0.5 10*3/uL (ref 0.1–0.9)
NEUTROS ABS: 4.2 10*3/uL (ref 1.4–7.0)
NEUTROS PCT: 71 %
PLATELETS: 197 10*3/uL (ref 150–379)
RBC: 4.85 x10E6/uL (ref 4.14–5.80)
RDW: 12.7 % (ref 12.3–15.4)
WBC: 5.9 10*3/uL (ref 3.4–10.8)

## 2017-11-26 LAB — PSA: Prostate Specific Ag, Serum: 0.4 ng/mL (ref 0.0–4.0)

## 2017-11-26 LAB — LIPID PANEL
Chol/HDL Ratio: 3.1 ratio (ref 0.0–5.0)
Cholesterol, Total: 141 mg/dL (ref 100–199)
HDL: 45 mg/dL (ref >39)
LDL CALC: 69 mg/dL (ref 0–99)
Triglycerides: 134 mg/dL (ref 0–149)
VLDL CHOLESTEROL CAL: 27 mg/dL (ref 5–40)

## 2017-11-26 LAB — TESTOSTERONE,FREE AND TOTAL
TESTOSTERONE FREE: 4.6 pg/mL — AB (ref 7.2–24.0)
Testosterone: 336 ng/dL (ref 264–916)

## 2017-11-26 LAB — TSH: TSH: 1.27 u[IU]/mL (ref 0.450–4.500)

## 2017-12-01 ENCOUNTER — Telehealth: Payer: Self-pay | Admitting: Student in an Organized Health Care Education/Training Program

## 2017-12-01 MED ORDER — TRAMADOL HCL 50 MG PO TABS: 50.0000 mg | ORAL_TABLET | Freq: Four times a day (QID) | ORAL | 1 refills | Status: DC | PRN

## 2017-12-01 NOTE — Telephone Encounter (Signed)
Patient is having some issues with new meds and would like to speak with someone about this. Please call

## 2017-12-01 NOTE — Telephone Encounter (Signed)
Returned one script for Hydrocodone and #76 tabs of Hydrocodone. Script voided and sent to be scanned. Tabs wasted in toilet, witnessed by patient and Rise Patience, RN. New script for Tramadol given to patient, instructed how to take it.

## 2017-12-01 NOTE — Telephone Encounter (Signed)
Patient states that he has been on Vicodin for about a week and it is not "working" for him. States that it is not really  helping with the pain and that it is making him feel "weak". Denies any GI symptoms, sickness, fever or increased sleepiness from Vicodin. States that he and Dr. Holley Raring discussed trying Tramadol and would like to know if this could be an option. Spoke with Dr. Holley Raring and he will write script for Tramadol and patient will need to bring in remaining Vicodin to count/waste. Patient in agreement with plan.

## 2017-12-01 NOTE — Addendum Note (Signed)
Addended by: Gillis Santa on: 12/01/2017 09:29 AM   Modules accepted: Orders

## 2017-12-01 NOTE — Progress Notes (Signed)
Patient called today stating that hydrocodone that was prescribed last week was not effective.  He states that it is making him week and is not helping out with his pain.  Patient is requesting to trial tramadol instead.  This is reasonable.  Nursing staff have instructed patient to bring his hydrocodone pill bottle into clinic to be counted and wasted at which point he will be given tramadol prescription.  Patient also has a second prescription for hydrocodone which he will bring in that will be discarded.

## 2017-12-01 NOTE — Telephone Encounter (Signed)
Patient brought meds and scripts to be exchanged

## 2017-12-09 ENCOUNTER — Encounter: Payer: 59 | Admitting: Nurse Practitioner

## 2017-12-10 ENCOUNTER — Ambulatory Visit (INDEPENDENT_AMBULATORY_CARE_PROVIDER_SITE_OTHER): Payer: 59

## 2017-12-10 DIAGNOSIS — Z8249 Family history of ischemic heart disease and other diseases of the circulatory system: Secondary | ICD-10-CM | POA: Diagnosis not present

## 2017-12-12 ENCOUNTER — Other Ambulatory Visit: Payer: Self-pay | Admitting: Family Medicine

## 2017-12-12 DIAGNOSIS — E785 Hyperlipidemia, unspecified: Secondary | ICD-10-CM

## 2017-12-22 ENCOUNTER — Other Ambulatory Visit: Payer: Self-pay | Admitting: Family Medicine

## 2018-01-20 ENCOUNTER — Encounter: Payer: 59 | Admitting: Student in an Organized Health Care Education/Training Program

## 2018-02-03 ENCOUNTER — Other Ambulatory Visit: Payer: Self-pay | Admitting: Family Medicine

## 2018-02-03 DIAGNOSIS — I10 Essential (primary) hypertension: Secondary | ICD-10-CM

## 2018-02-03 MED ORDER — AMLODIPINE BESYLATE 5 MG PO TABS: 5.0000 mg | ORAL_TABLET | Freq: Every day | ORAL | 3 refills | Status: DC

## 2018-02-03 NOTE — Telephone Encounter (Signed)
[facility] faxed a refill request for the following medication. Thanks CC  amLODipine (NORVASC) 5 MG tablet

## 2018-02-04 ENCOUNTER — Ambulatory Visit
Payer: 59 | Attending: Student in an Organized Health Care Education/Training Program | Admitting: Student in an Organized Health Care Education/Training Program

## 2018-02-04 ENCOUNTER — Encounter: Payer: Self-pay | Admitting: Student in an Organized Health Care Education/Training Program

## 2018-02-04 ENCOUNTER — Other Ambulatory Visit: Payer: Self-pay

## 2018-02-04 VITALS — BP 128/91 | HR 84 | Temp 98.2°F | Resp 16 | Ht 71.0 in | Wt 175.0 lb

## 2018-02-04 DIAGNOSIS — M47816 Spondylosis without myelopathy or radiculopathy, lumbar region: Secondary | ICD-10-CM | POA: Diagnosis not present

## 2018-02-04 DIAGNOSIS — I10 Essential (primary) hypertension: Secondary | ICD-10-CM | POA: Diagnosis not present

## 2018-02-04 DIAGNOSIS — M1288 Other specific arthropathies, not elsewhere classified, other specified site: Secondary | ICD-10-CM | POA: Diagnosis not present

## 2018-02-04 DIAGNOSIS — Z833 Family history of diabetes mellitus: Secondary | ICD-10-CM | POA: Diagnosis not present

## 2018-02-04 DIAGNOSIS — Z808 Family history of malignant neoplasm of other organs or systems: Secondary | ICD-10-CM | POA: Insufficient documentation

## 2018-02-04 DIAGNOSIS — Z7982 Long term (current) use of aspirin: Secondary | ICD-10-CM | POA: Diagnosis not present

## 2018-02-04 DIAGNOSIS — E785 Hyperlipidemia, unspecified: Secondary | ICD-10-CM | POA: Diagnosis not present

## 2018-02-04 DIAGNOSIS — M5136 Other intervertebral disc degeneration, lumbar region: Secondary | ICD-10-CM

## 2018-02-04 DIAGNOSIS — G894 Chronic pain syndrome: Secondary | ICD-10-CM | POA: Diagnosis not present

## 2018-02-04 DIAGNOSIS — Z8249 Family history of ischemic heart disease and other diseases of the circulatory system: Secondary | ICD-10-CM | POA: Diagnosis not present

## 2018-02-04 DIAGNOSIS — M5116 Intervertebral disc disorders with radiculopathy, lumbar region: Secondary | ICD-10-CM | POA: Diagnosis not present

## 2018-02-04 DIAGNOSIS — M7918 Myalgia, other site: Secondary | ICD-10-CM

## 2018-02-04 DIAGNOSIS — M545 Low back pain: Secondary | ICD-10-CM | POA: Diagnosis not present

## 2018-02-04 DIAGNOSIS — N529 Male erectile dysfunction, unspecified: Secondary | ICD-10-CM | POA: Insufficient documentation

## 2018-02-04 DIAGNOSIS — K219 Gastro-esophageal reflux disease without esophagitis: Secondary | ICD-10-CM | POA: Diagnosis not present

## 2018-02-04 DIAGNOSIS — E559 Vitamin D deficiency, unspecified: Secondary | ICD-10-CM | POA: Insufficient documentation

## 2018-02-04 DIAGNOSIS — Z951 Presence of aortocoronary bypass graft: Secondary | ICD-10-CM | POA: Diagnosis not present

## 2018-02-04 DIAGNOSIS — Z79899 Other long term (current) drug therapy: Secondary | ICD-10-CM | POA: Diagnosis not present

## 2018-02-04 DIAGNOSIS — G8929 Other chronic pain: Secondary | ICD-10-CM | POA: Diagnosis not present

## 2018-02-04 DIAGNOSIS — Z800 Family history of malignant neoplasm of digestive organs: Secondary | ICD-10-CM | POA: Diagnosis not present

## 2018-02-04 DIAGNOSIS — Z880 Allergy status to penicillin: Secondary | ICD-10-CM | POA: Insufficient documentation

## 2018-02-04 DIAGNOSIS — F329 Major depressive disorder, single episode, unspecified: Secondary | ICD-10-CM | POA: Insufficient documentation

## 2018-02-04 DIAGNOSIS — M5416 Radiculopathy, lumbar region: Secondary | ICD-10-CM | POA: Diagnosis not present

## 2018-02-04 DIAGNOSIS — M51369 Other intervertebral disc degeneration, lumbar region without mention of lumbar back pain or lower extremity pain: Secondary | ICD-10-CM

## 2018-02-04 DIAGNOSIS — I2510 Atherosclerotic heart disease of native coronary artery without angina pectoris: Secondary | ICD-10-CM | POA: Diagnosis not present

## 2018-02-04 MED ORDER — TRAMADOL HCL 50 MG PO TABS: 100.0000 mg | ORAL_TABLET | Freq: Three times a day (TID) | ORAL | 1 refills | Status: DC | PRN

## 2018-02-04 NOTE — Patient Instructions (Signed)
You were given one prescription for Tramadol.

## 2018-02-04 NOTE — Progress Notes (Signed)
Nursing Pain Medication Assessment:  Safety precautions to be maintained throughout the outpatient stay will include: orient to surroundings, keep bed in low position, maintain call bell within reach at all times, provide assistance with transfer out of bed and ambulation.  Medication Inspection Compliance: Pill count conducted under aseptic conditions, in front of the patient. Neither the pills nor the bottle was removed from the patient's sight at any time. Once count was completed pills were immediately returned to the patient in their original bottle.  Medication: Tramadol (Ultram) Pill/Patch Count: 4 of 120 pills remain Pill/Patch Appearance: Markings consistent with prescribed medication Bottle Appearance: Standard pharmacy container. Clearly labeled. Filled Date: 4 / 3 / 2019 Last Medication intake:  Today

## 2018-02-04 NOTE — Progress Notes (Signed)
Patient's Name: Shawn Crawford  MRN: [id number]  Referring Provider: Jerrol Banana.,*  DOB: 12-12-1960  PCP: Jerrol Banana., MD  DOS: 02/04/2018  Note by: Gillis Santa, MD  Service setting: Ambulatory outpatient  Specialty: Interventional Pain Management  Location: [facility] (AMB) Pain Management Facility    Patient type: Established   Primary Reason(s) for Visit: Encounter for prescription drug management. (Level of risk: moderate)  CC: Back Pain (lower)  HPI  Shawn Crawford is a 57 y.o. year old, male patient, who comes today for a medication management evaluation. He has Abnormal blood sugar; Arteriosclerosis of coronary artery; ED (erectile dysfunction) of organic origin; Acid reflux; HLD (hyperlipidemia); BP (high blood pressure); Lumbar radiculopathy; Adiposity; Avitaminosis D; Hyperglycemia; Benign essential HTN; Chronic pain syndrome; Chronic low back pain; and Lumbar facet arthropathy on their problem list. His primarily concern today is the Back Pain (lower)  Pain Assessment: Location: Lower Back Radiating: sometimes pain is localized in buttocks or back, denies radiating pain Onset:  >3 years ago Duration: Chronic pain Quality: Constant, Aching Severity: 2 /10 (subjective, self-reported pain score)  Note: Reported level is compatible with observation.                         When using our objective Pain Scale, levels between 6 and 10/10 are said to belong in an emergency room, as it progressively worsens from a 6/10, described as severely limiting, requiring emergency care not usually available at an outpatient pain management facility. At a 6/10 level, communication becomes difficult and requires great effort. Assistance to reach the emergency department may be required. Facial flushing and profuse sweating along with potentially dangerous increases in heart rate and blood pressure will be evident. Effect on ADL:  limits ability to do outside work Timing: Constant Modifying  factors: medications, BP: (!) 128/91  HR: 84  Shawn Crawford was last scheduled for an appointment on 12/01/2017 for medication management. During today's appointment we reviewed Shawn Crawford chronic pain status, as well as his outpatient medication regimen.  Patient obtaining satisfactory pain relief with tramadol.  That the duration of pain relief is much better than it was with Nucynta or hydrocodone which he tried in the past.  He states that it is not a strong however.  Will increase to 100 mg up to 3 times daily as needed.  The patient  reports that he does not use drugs. His body mass index is 24.41 kg/m.  Further details on both, my assessment(s), as well as the proposed treatment plan, please see below.  Controlled Substance Pharmacotherapy Assessment REMS (Risk Evaluation and Mitigation Strategy)  Analgesic: Tramadol 100 mg 3 times daily as needed, quantity 180 a month MME/day: 30 mg/day.  Rise Patience, RN  02/04/2018 11:56 AM  Sign at close encounter Nursing Pain Medication Assessment:  Safety precautions to be maintained throughout the outpatient stay will include: orient to surroundings, keep bed in low position, maintain call bell within reach at all times, provide assistance with transfer out of bed and ambulation.  Medication Inspection Compliance: Pill count conducted under aseptic conditions, in front of the patient. Neither the pills nor the bottle was removed from the patient's sight at any time. Once count was completed pills were immediately returned to the patient in their original bottle.  Medication: Tramadol (Ultram) Pill/Patch Count: 4 of 120 pills remain Pill/Patch Appearance: Markings consistent with prescribed medication Bottle Appearance: Standard pharmacy container. Clearly labeled. Filled Date: 4 /  3 / 2019 Last Medication intake:  Today   Pharmacokinetics: Liberation and absorption (onset of action): WNL Distribution (time to peak effect): WNL Metabolism and  excretion (duration of action): WNL         Pharmacodynamics: Desired effects: Analgesia: Shawn Crawford reports >50% benefit. Functional ability: Patient reports that medication allows him to accomplish basic ADLs Clinically meaningful improvement in function (CMIF): Sustained CMIF goals met Perceived effectiveness: Described as relatively effective, allowing for increase in activities of daily living (ADL) Undesirable effects: Side-effects or Adverse reactions: None reported Monitoring: [place] PMP: Online review of the past 3-monthperiod conducted. Compliant with practice rules and regulations Last UDS on record: Summary  Date Value Ref Range Status  07/07/2017 FINAL  Corrected    Comment:    ==================================================================== [facility] COMP DRUG ANALYSIS,UR ==================================================================== Test                             Result       Flag       Units Drug Present and Declared for Prescription Verification   Zolpidem                       PRESENT      EXPECTED   Zolpidem Acid                  PRESENT      EXPECTED    Zolpidem acid is an expected metabolite of zolpidem.   Metoprolol                     PRESENT      EXPECTED Drug Present not Declared for Prescription Verification   Naproxen                       PRESENT      UNEXPECTED Drug Absent but Declared for Prescription Verification   Tizanidine                     Not Detected UNEXPECTED    Tizanidine, as indicated in the declared medication list, is not    always detected even when used as directed.   Salicylate                     Not Detected UNEXPECTED    Aspirin, as indicated in the declared medication list, is not    always detected even when used as directed. ==================================================================== Test                      Result    Flag   Units      Ref Range   Creatinine              73               mg/dL       >=20 ==================================================================== Declared Medications:  The flagging and interpretation on this report are based on the  following declared medications.  Unexpected results may arise from  inaccuracies in the declared medications.  **Note: The testing scope of this panel includes these medications:  Metoprolol (Lopressor)  **Note: The testing scope of this panel does not include small to  moderate amounts of these reported medications:  Aspirin  Tizanidine (Zanaflex)  Zolpidem (Ambien)  **Note: The testing scope of this panel does not include following  reported  medications:  Fexofenadine (Allegra)  Omega-3 Fatty Acids (Fish Oil)  Rosuvastatin (Crestor)  Ubiquinone (CoQ10) ==================================================================== For clinical consultation, please call [phone]. ====================================================================    UDS interpretation: Compliant          Medication Assessment Form: Reviewed. Patient indicates being compliant with therapy Treatment compliance: Compliant Risk Assessment Profile: Aberrant behavior: See prior evaluations. None observed or detected today Comorbid factors increasing risk of overdose: See prior notes. No additional risks detected today Risk of substance use disorder (SUD): Low Opioid Risk Tool - 02/04/18 1154      Family History of Substance Abuse   Alcohol  Negative    Illegal Drugs  Negative    Rx Drugs  Negative      Personal History of Substance Abuse   Alcohol  Negative    Illegal Drugs  Negative    Rx Drugs  Negative      Age   Age between 69-45 years   No      History of Preadolescent Sexual Abuse   History of Preadolescent Sexual Abuse  Negative or Male      Psychological Disease   Psychological Disease  Negative    Depression  Negative      Total Score   Opioid Risk Tool Scoring  0    Opioid Risk Interpretation  Low Risk      ORT  Scoring interpretation table:  Score <3 = Low Risk for SUD  Score between 4-7 = Moderate Risk for SUD  Score >8 = High Risk for Opioid Abuse   Risk Mitigation Strategies:  Patient Counseling: Covered Patient-Prescriber Agreement (PPA): Present and active  Notification to other healthcare providers: Done  Pharmacologic Plan: Increase tramadol from 50 mg 3 times daily to 4 times daily as needed 200 mg 3 times daily as needed.             Laboratory Chemistry  Inflammation Markers (CRP: Acute Phase) (ESR: Chronic Phase) Lab Results  Component Value Date   ESRSEDRATE 2 05/12/2017                         Rheumatology Markers Lab Results  Component Value Date   ANA Negative 05/12/2017   LABURIC 7.5 07/08/2017                        Renal Function Markers Lab Results  Component Value Date   BUN 9 11/25/2017   CREATININE 1.04 11/25/2017   GFRAA 92 11/25/2017   GFRNONAA 79 11/25/2017                              Hepatic Function Markers Lab Results  Component Value Date   AST 30 11/25/2017   ALT 44 11/25/2017   ALBUMIN 5.0 11/25/2017   ALKPHOS 55 11/25/2017                        Electrolytes Lab Results  Component Value Date   NA 141 11/25/2017   K 4.4 11/25/2017   CL 102 11/25/2017   CALCIUM 10.1 11/25/2017                        Neuropathy Markers Lab Results  Component Value Date   HGBA1C 5.8 (H) 02/12/2017  Bone Pathology Markers Lab Results  Component Value Date   TESTOFREE 4.6 (L) 11/25/2017   TESTOSTERONE 336 11/25/2017                         Coagulation Parameters Lab Results  Component Value Date   PLT 197 11/25/2017                        Cardiovascular Markers Lab Results  Component Value Date   HGB 14.8 11/25/2017   HCT 41.5 11/25/2017                         CA Markers No results found for: CEA, CA125, LABCA2                      Note: Lab results reviewed.  Meds   Current Outpatient Medications:  .   amLODipine (NORVASC) 5 MG tablet, Take 1 tablet (5 mg total) by mouth daily., Disp: 90 tablet, Rfl: 3 .  aspirin EC 81 MG tablet, Take 81 mg by mouth daily., Disp: , Rfl:  .  COENZYME Q-10 PO, Take 1 capsule by mouth daily. , Disp: , Rfl:  .  fexofenadine (ALLEGRA) 180 MG tablet, Take 180 mg by mouth daily as needed. , Disp: , Rfl:  .  metoprolol tartrate (LOPRESSOR) 25 MG tablet, Take 0.5 tablets (12.5 mg total) by mouth 2 (two) times daily., Disp: 60 tablet, Rfl: 1 .  MULTIPLE VITAMINS PO, Take 1 tablet by mouth daily. , Disp: , Rfl:  .  Omega-3 Fatty Acids (FISH OIL) 1200 MG CAPS, Take 1 capsule by mouth daily. , Disp: , Rfl:  .  rosuvastatin (CRESTOR) 40 MG tablet, TAKE 1 TABLET BY MOUTH  DAILY, Disp: 90 tablet, Rfl: 3 .  traMADol (ULTRAM) 50 MG tablet, Take 2 tablets (100 mg total) by mouth 3 (three) times daily as needed for severe pain. For chronic pain To last for 30 days from fill date., Disp: 180 tablet, Rfl: 1 .  zolpidem (AMBIEN) 10 MG tablet, TAKE 1 TABLET BY MOUTH AT  BEDTIME AS NEEDED FOR SLEEP, Disp: 90 tablet, Rfl: 1  ROS  Constitutional: Denies any fever or chills Gastrointestinal: No reported hemesis, hematochezia, vomiting, or acute GI distress Musculoskeletal: Denies any acute onset joint swelling, redness, loss of ROM, or weakness Neurological: No reported episodes of acute onset apraxia, aphasia, dysarthria, agnosia, amnesia, paralysis, loss of coordination, or loss of consciousness  Allergies  Shawn Crawford is allergic to penicillins.  Shawn Crawford  Shawn Crawford  reports that he does not use drugs. Alcohol:  reports that he drinks alcohol. Tobacco:  reports that he has never smoked. He has never used smokeless tobacco. Medical:  has a past medical history of cardiac catheterization, Hyperlipidemia, and Hypertension. Surgical: Shawn Crawford  has a past surgical history that includes Coronary artery bypass graft; Hernia repair; and Appendectomy. Family: family history includes Colon  cancer in his sister; Diabetes in his father; Hyperlipidemia in his mother; Hypertension in his father; Thyroid cancer in his sister.  Constitutional Exam  General appearance: Well nourished, well developed, and well hydrated. In no apparent acute distress Vitals:   02/04/18 1148  BP: (!) 128/91  Pulse: 84  Resp: 16  Temp: 98.2 F (36.8 C)  TempSrc: Oral  SpO2: 100%  Weight: 175 lb (79.4 kg)  Height: 5' 11" (1.803 m)   BMI Assessment:  Estimated body mass index is 24.41 kg/m as calculated from the following:   Height as of this encounter: 5' 11" (1.803 m).   Weight as of this encounter: 175 lb (79.4 kg).  BMI interpretation table: BMI level Category Range association with higher incidence of chronic pain  <18 kg/m2 Underweight   18.5-24.9 kg/m2 Ideal body weight   25-29.9 kg/m2 Overweight Increased incidence by 20%  30-34.9 kg/m2 Obese (Class I) Increased incidence by 68%  35-39.9 kg/m2 Severe obesity (Class II) Increased incidence by 136%  >40 kg/m2 Extreme obesity (Class III) Increased incidence by 254%   Patient's current BMI Ideal Body weight  Body mass index is 24.41 kg/m. Ideal body weight: 75.3 kg (166 lb 0.1 oz) Adjusted ideal body weight: 76.9 kg (169 lb 9.7 oz)   BMI Readings from Last 4 Encounters:  02/04/18 24.41 kg/m  11/24/17 24.97 kg/m  11/24/17 24.97 kg/m  11/17/17 24.97 kg/m   Wt Readings from Last 4 Encounters:  02/04/18 175 lb (79.4 kg)  11/24/17 179 lb (81.2 kg)  11/24/17 179 lb (81.2 kg)  11/17/17 179 lb (81.2 kg)  Psych/Mental status: Alert, oriented x 3 (person, place, & time)       Eyes: PERLA Respiratory: No evidence of acute respiratory distress  Cervical Spine Area Exam  Skin & Axial Inspection: No masses, redness, edema, swelling, or associated skin lesions Alignment: Symmetrical Functional ROM: Unrestricted ROM      Stability: No instability detected Muscle Tone/Strength: Functionally intact. No obvious neuro-muscular anomalies  detected. Sensory (Neurological): Unimpaired Palpation: No palpable anomalies              Upper Extremity (UE) Exam    Side: Right upper extremity  Side: Left upper extremity  Skin & Extremity Inspection: Skin color, temperature, and hair growth are WNL. No peripheral edema or cyanosis. No masses, redness, swelling, asymmetry, or associated skin lesions. No contractures.  Skin & Extremity Inspection: Skin color, temperature, and hair growth are WNL. No peripheral edema or cyanosis. No masses, redness, swelling, asymmetry, or associated skin lesions. No contractures.  Functional ROM: Unrestricted ROM          Functional ROM: Unrestricted ROM          Muscle Tone/Strength: Functionally intact. No obvious neuro-muscular anomalies detected.  Muscle Tone/Strength: Functionally intact. No obvious neuro-muscular anomalies detected.  Sensory (Neurological): Unimpaired          Sensory (Neurological): Unimpaired          Palpation: No palpable anomalies              Palpation: No palpable anomalies              Specialized Test(s): Deferred         Specialized Test(s): Deferred          Thoracic Spine Area Exam  Skin & Axial Inspection: No masses, redness, or swelling Alignment: Symmetrical Functional ROM: Unrestricted ROM Stability: No instability detected Muscle Tone/Strength: Functionally intact. No obvious neuro-muscular anomalies detected. Sensory (Neurological): Unimpaired Muscle strength & Tone: No palpable anomalies  Lumbar Spine Area Exam  Skin & Axial Inspection: No masses, redness, or swelling Alignment: Symmetrical Functional ROM: Unrestricted ROM       Stability: No instability detected Muscle Tone/Strength: Functionally intact. No obvious neuro-muscular anomalies detected. Sensory (Neurological): Unimpaired Palpation: No palpable anomalies       Provocative Tests: Lumbar Hyperextension and rotation test: evaluation deferred today       Lumbar Lateral bending test: evaluation  deferred today       Patrick's Maneuver: evaluation deferred today                    Gait & Posture Assessment  Ambulation: Unassisted Gait: Relatively normal for age and body habitus Posture: WNL   Lower Extremity Exam    Side: Right lower extremity  Side: Left lower extremity  Stability: No instability observed          Stability: No instability observed          Skin & Extremity Inspection: Skin color, temperature, and hair growth are WNL. No peripheral edema or cyanosis. No masses, redness, swelling, asymmetry, or associated skin lesions. No contractures.  Skin & Extremity Inspection: Skin color, temperature, and hair growth are WNL. No peripheral edema or cyanosis. No masses, redness, swelling, asymmetry, or associated skin lesions. No contractures.  Functional ROM: Unrestricted ROM                  Functional ROM: Unrestricted ROM                  Muscle Tone/Strength: Functionally intact. No obvious neuro-muscular anomalies detected.  Muscle Tone/Strength: Functionally intact. No obvious neuro-muscular anomalies detected.  Sensory (Neurological): Unimpaired  Sensory (Neurological): Unimpaired  Palpation: No palpable anomalies  Palpation: No palpable anomalies   Assessment  Primary Diagnosis & Pertinent Problem List: The primary encounter diagnosis was Lumbar radiculopathy. Diagnoses of Chronic midline low back pain without sciatica, Lumbar facet arthropathy, Chronic pain syndrome, Lumbar degenerative disc disease, and Piriformis muscle pain were also pertinent to this visit.  Status Diagnosis  Controlled Controlled Controlled 1. Lumbar radiculopathy   2. Chronic midline low back pain without sciatica   3. Lumbar facet arthropathy   4. Chronic pain syndrome   5. Lumbar degenerative disc disease   6. Piriformis muscle pain     General Recommendations: The pain condition that the patient suffers from is best treated with a multidisciplinary approach that involves an increase in  physical activity to prevent de-conditioning and worsening of the pain cycle, as well as psychological counseling (formal and/or informal) to address the co-morbid psychological affects of pain. Treatment will often involve judicious use of pain medications and interventional procedures to decrease the pain, allowing the patient to participate in the physical activity that will ultimately produce long-lasting pain reductions. The goal of the multidisciplinary approach is to return the patient to a higher level of overall function and to restore their ability to perform activities of daily living.  57 year old male with axial low back pain that radiates into bilateral buttocks and posterior thighs, onset and duration approximately 3 years. Patient's lumbar MRI is significant for lumbar spondylosis, facet arthropathy and L3-L4 disc bulge. Patient has had 2 diagnostic lumbar medial branch nerve blocks which were not effective. He has undergone physical therapy and did not obtain any significant benefit.  Patient has been taking tramadol 50 mg 3 times daily to 4 times daily as needed.  He states that the duration of analgesia with this is better than what he experienced with Nucynta or hydrocodone however the decrease in his pain scores are not as extensive.  Recommended increasing the dose so that he is able to take 100 mg 3 times daily as needed.  Patient in agreement with plan.  Prescription provided for 2 months.   Plan of Care  Pharmacotherapy (Medications Ordered): Meds ordered this encounter  Medications  . traMADol (ULTRAM) 50 MG tablet  Sig: Take 2 tablets (100 mg total) by mouth 3 (three) times daily as needed for severe pain. For chronic pain To last for 30 days from fill date.    Dispense:  180 tablet    Refill:  1    Do not place this medication, or any other prescription from our practice, on "Automatic Refill". Patient may have prescription filled one day early if pharmacy is closed on  scheduled refill date.   Time Note: Greater than 50% of the 25 minute(s) of face-to-face time spent with Shawn Crawford, was spent in counseling/coordination of care regarding: Shawn Crawford primary cause of pain, the treatment plan, medication side effects, the appropriate use of his medications, the medication agreement and the patient's responsibilities when it comes to controlled substances.  Provider-requested follow-up: Return in about 2 months (around 04/05/2018) for Medication Management.  Future Appointments  Date Time Provider [place]  04/06/2018  9:45 AM Gillis Santa, MD [facility] None  05/25/2018  8:20 AM Jerrol Banana., MD [facility] None    Primary Care Physician: Jerrol Banana., MD Location: [facility] Note by: Gillis Santa, M.D Date: 02/04/2018; Time: 1:09 PM  Patient Instructions  You were given one prescription for Tramadol.

## 2018-04-06 ENCOUNTER — Ambulatory Visit
Payer: 59 | Attending: Student in an Organized Health Care Education/Training Program | Admitting: Student in an Organized Health Care Education/Training Program

## 2018-04-06 ENCOUNTER — Encounter: Payer: Self-pay | Admitting: Student in an Organized Health Care Education/Training Program

## 2018-04-06 ENCOUNTER — Other Ambulatory Visit: Payer: Self-pay

## 2018-04-06 VITALS — BP 118/85 | HR 72 | Temp 98.5°F | Resp 16 | Ht 71.0 in | Wt 171.0 lb

## 2018-04-06 DIAGNOSIS — I2510 Atherosclerotic heart disease of native coronary artery without angina pectoris: Secondary | ICD-10-CM | POA: Diagnosis not present

## 2018-04-06 DIAGNOSIS — R1030 Lower abdominal pain, unspecified: Secondary | ICD-10-CM | POA: Diagnosis not present

## 2018-04-06 DIAGNOSIS — Z79899 Other long term (current) drug therapy: Secondary | ICD-10-CM | POA: Diagnosis not present

## 2018-04-06 DIAGNOSIS — Z7982 Long term (current) use of aspirin: Secondary | ICD-10-CM | POA: Diagnosis not present

## 2018-04-06 DIAGNOSIS — I10 Essential (primary) hypertension: Secondary | ICD-10-CM | POA: Insufficient documentation

## 2018-04-06 DIAGNOSIS — M5416 Radiculopathy, lumbar region: Secondary | ICD-10-CM | POA: Diagnosis not present

## 2018-04-06 DIAGNOSIS — N529 Male erectile dysfunction, unspecified: Secondary | ICD-10-CM | POA: Diagnosis not present

## 2018-04-06 DIAGNOSIS — M5116 Intervertebral disc disorders with radiculopathy, lumbar region: Secondary | ICD-10-CM | POA: Diagnosis not present

## 2018-04-06 DIAGNOSIS — G894 Chronic pain syndrome: Secondary | ICD-10-CM | POA: Insufficient documentation

## 2018-04-06 DIAGNOSIS — F329 Major depressive disorder, single episode, unspecified: Secondary | ICD-10-CM | POA: Diagnosis not present

## 2018-04-06 DIAGNOSIS — E785 Hyperlipidemia, unspecified: Secondary | ICD-10-CM | POA: Insufficient documentation

## 2018-04-06 DIAGNOSIS — M545 Low back pain: Secondary | ICD-10-CM

## 2018-04-06 DIAGNOSIS — M7918 Myalgia, other site: Secondary | ICD-10-CM | POA: Insufficient documentation

## 2018-04-06 DIAGNOSIS — K219 Gastro-esophageal reflux disease without esophagitis: Secondary | ICD-10-CM | POA: Insufficient documentation

## 2018-04-06 DIAGNOSIS — M47816 Spondylosis without myelopathy or radiculopathy, lumbar region: Secondary | ICD-10-CM

## 2018-04-06 DIAGNOSIS — Z951 Presence of aortocoronary bypass graft: Secondary | ICD-10-CM | POA: Insufficient documentation

## 2018-04-06 DIAGNOSIS — Z880 Allergy status to penicillin: Secondary | ICD-10-CM | POA: Insufficient documentation

## 2018-04-06 DIAGNOSIS — M791 Myalgia, unspecified site: Secondary | ICD-10-CM | POA: Diagnosis not present

## 2018-04-06 DIAGNOSIS — M5136 Other intervertebral disc degeneration, lumbar region: Secondary | ICD-10-CM | POA: Insufficient documentation

## 2018-04-06 DIAGNOSIS — E559 Vitamin D deficiency, unspecified: Secondary | ICD-10-CM | POA: Diagnosis not present

## 2018-04-06 DIAGNOSIS — G8929 Other chronic pain: Secondary | ICD-10-CM

## 2018-04-06 DIAGNOSIS — Z5181 Encounter for therapeutic drug level monitoring: Secondary | ICD-10-CM | POA: Diagnosis present

## 2018-04-06 MED ORDER — TRAMADOL HCL 50 MG PO TABS: 100.0000 mg | ORAL_TABLET | Freq: Three times a day (TID) | ORAL | 3 refills | Status: DC | PRN

## 2018-04-06 NOTE — Progress Notes (Signed)
Nursing Pain Medication Assessment:  Safety precautions to be maintained throughout the outpatient stay will include: orient to surroundings, keep bed in low position, maintain call bell within reach at all times, provide assistance with transfer out of bed and ambulation.  Medication Inspection Compliance: Pill count conducted under aseptic conditions, in front of the patient. Neither the pills nor the bottle was removed from the patient's sight at any time. Once count was completed pills were immediately returned to the patient in their original bottle.  Medication: Tramadol (Ultram) Pill/Patch Count: 16 of 180 pills remain Pill/Patch Appearance: Markings consistent with prescribed medication Bottle Appearance: Standard pharmacy container. Clearly labeled. Filled Date: 06 / 07 / 2019 Last Medication intake:  Today

## 2018-04-06 NOTE — Progress Notes (Signed)
Patient's Name: Shawn Crawford  MRN: [id number]  Referring Provider: Jerrol Banana.,*  DOB: 1961/03/01  PCP: Jerrol Banana., MD  DOS: 04/06/2018  Note by: Gillis Santa, MD  Service setting: Ambulatory outpatient  Specialty: Interventional Pain Management  Location: [facility] (AMB) Pain Management Facility    Patient type: Established   Primary Reason(s) for Visit: Encounter for prescription drug management. (Level of risk: moderate)  CC: Back Pain (low, radiates to bilateral buttocks) and Groin Pain (bilateral)  HPI  Shawn Crawford is a 57 y.o. year old, male patient, who comes today for a medication management evaluation. He has Abnormal blood sugar; Arteriosclerosis of coronary artery; ED (erectile dysfunction) of organic origin; Acid reflux; HLD (hyperlipidemia); BP (high blood pressure); Lumbar radiculopathy; Adiposity; Avitaminosis D; Hyperglycemia; Benign essential HTN; Chronic pain syndrome; Chronic midline low back pain without sciatica; Lumbar facet arthropathy; Lumbar degenerative disc disease; and Piriformis muscle pain on their problem list. His primarily concern today is the Back Pain (low, radiates to bilateral buttocks) and Groin Pain (bilateral)  Pain Assessment: Location: Lower Back Radiating: buttocks, groin bilaterally Onset: More than a month ago Duration: Chronic pain Quality: Aching(toothache like) Severity: 2 /10 (subjective, self-reported pain score)  Note: Reported level is compatible with observation.                         When using our objective Pain Scale, levels between 6 and 10/10 are said to belong in an emergency room, as it progressively worsens from a 6/10, described as severely limiting, requiring emergency care not usually available at an outpatient pain management facility. At a 6/10 level, communication becomes difficult and requires great effort. Assistance to reach the emergency department may be required. Facial flushing and profuse sweating along  with potentially dangerous increases in heart rate and blood pressure will be evident. Effect on ADL:   Timing: Intermittent Modifying factors: medications, positioning BP: 118/85  HR: 72  Shawn Crawford was last scheduled for an appointment on 02/04/2018 for medication management. During today's appointment we reviewed Shawn Crawford chronic pain status, as well as his outpatient medication regimen.  The patient  reports that he does not use drugs. His body mass index is 23.85 kg/m.  Further details on both, my assessment(s), as well as the proposed treatment plan, please see below.  Controlled Substance Pharmacotherapy Assessment REMS (Risk Evaluation and Mitigation Strategy)  Analgesic: Tramadol 100 mg TID prn MME/day: 30 mg/day.  Hart Rochester, RN  04/06/2018  9:46 AM  Sign at close encounter Nursing Pain Medication Assessment:  Safety precautions to be maintained throughout the outpatient stay will include: orient to surroundings, keep bed in low position, maintain call bell within reach at all times, provide assistance with transfer out of bed and ambulation.  Medication Inspection Compliance: Pill count conducted under aseptic conditions, in front of the patient. Neither the pills nor the bottle was removed from the patient's sight at any time. Once count was completed pills were immediately returned to the patient in their original bottle.  Medication: Tramadol (Ultram) Pill/Patch Count: 16 of 180 pills remain Pill/Patch Appearance: Markings consistent with prescribed medication Bottle Appearance: Standard pharmacy container. Clearly labeled. Filled Date: 06 / 07 / 2019 Last Medication intake:  Today   Pharmacokinetics: Liberation and absorption (onset of action): WNL Distribution (time to peak effect): WNL Metabolism and excretion (duration of action): WNL         Pharmacodynamics: Desired effects: Analgesia: Mr.  Shawn Crawford reports >50% benefit. Functional ability: Patient reports  that medication allows him to accomplish basic ADLs Clinically meaningful improvement in function (CMIF): Sustained CMIF goals met Perceived effectiveness: Described as relatively effective, allowing for increase in activities of daily living (ADL) Undesirable effects: Side-effects or Adverse reactions: None reported Monitoring: [place] PMP: Online review of the past 72-monthperiod conducted. Compliant with practice rules and regulations Last UDS on record: Summary  Date Value Ref Range Status  07/07/2017 FINAL  Corrected    Comment:    ==================================================================== TOXAS[facility]RE COMP DRUG ANALYSIS,UR ==================================================================== Test                             Result       Flag       Units Drug Present and Declared for Prescription Verification   Zolpidem                       PRESENT      EXPECTED   Zolpidem Acid                  PRESENT      EXPECTED    Zolpidem acid is an expected metabolite of zolpidem.   Metoprolol                     PRESENT      EXPECTED Drug Present not Declared for Prescription Verification   Naproxen                       PRESENT      UNEXPECTED Drug Absent but Declared for Prescription Verification   Tizanidine                     Not Detected UNEXPECTED    Tizanidine, as indicated in the declared medication list, is not    always detected even when used as directed.   Salicylate                     Not Detected UNEXPECTED    Aspirin, as indicated in the declared medication list, is not    always detected even when used as directed. ==================================================================== Test                      Result    Flag   Units      Ref Range   Creatinine              73               mg/dL      >=20 ==================================================================== Declared Medications:  The flagging and interpretation on this report are based on the   following declared medications.  Unexpected results may arise from  inaccuracies in the declared medications.  **Note: The testing scope of this panel includes these medications:  Metoprolol (Lopressor)  **Note: The testing scope of this panel does not include small to  moderate amounts of these reported medications:  Aspirin  Tizanidine (Zanaflex)  Zolpidem (Ambien)  **Note: The testing scope of this panel does not include following  reported medications:  Fexofenadine (Allegra)  Omega-3 Fatty Acids (Fish Oil)  Rosuvastatin (Crestor)  Ubiquinone (CoQ10) ==================================================================== For clinical consultation, please call ([phone] ====================================================================    UDS interpretation: Compliant          Medication Assessment Form: Reviewed.  Patient indicates being compliant with therapy Treatment compliance: Compliant Risk Assessment Profile: Aberrant behavior: See prior evaluations. None observed or detected today Comorbid factors increasing risk of overdose: See prior notes. No additional risks detected today Risk of substance use disorder (SUD): Low Opioid Risk Tool - 04/06/18 0947      Family History of Substance Abuse   Alcohol  Negative    Illegal Drugs  Negative    Rx Drugs  Negative      Personal History of Substance Abuse   Alcohol  Negative    Illegal Drugs  Negative    Rx Drugs  Negative      Age   Age between 83-45 years   No      History of Preadolescent Sexual Abuse   History of Preadolescent Sexual Abuse  Negative or Male      Psychological Disease   Psychological Disease  Negative    Depression  Negative      Total Score   Opioid Risk Tool Scoring  0    Opioid Risk Interpretation  Low Risk      ORT Scoring interpretation table:  Score <3 = Low Risk for SUD  Score between 4-7 = Moderate Risk for SUD  Score >8 = High Risk for Opioid Abuse   Risk Mitigation  Strategies:  Patient Counseling: Covered Patient-Prescriber Agreement (PPA): Present and active  Notification to other healthcare providers: Done  Pharmacologic Plan: No change in therapy, at this time.             Laboratory Chemistry  Inflammation Markers (CRP: Acute Phase) (ESR: Chronic Phase) Lab Results  Component Value Date   ESRSEDRATE 2 05/12/2017                         Rheumatology Markers Lab Results  Component Value Date   ANA Negative 05/12/2017   LABURIC 7.5 07/08/2017   HLAB27 Negative 05/12/2017                        Renal Function Markers Lab Results  Component Value Date   BUN 9 11/25/2017   CREATININE 1.04 11/25/2017   BCR 9 11/25/2017   GFRAA 92 11/25/2017   GFRNONAA 79 11/25/2017                             Hepatic Function Markers Lab Results  Component Value Date   AST 30 11/25/2017   ALT 44 11/25/2017   ALBUMIN 5.0 11/25/2017   ALKPHOS 55 11/25/2017                        Electrolytes Lab Results  Component Value Date   NA 141 11/25/2017   K 4.4 11/25/2017   CL 102 11/25/2017   CALCIUM 10.1 11/25/2017                        Neuropathy Markers Lab Results  Component Value Date   HGBA1C 5.8 (H) 02/12/2017                        Bone Pathology Markers Lab Results  Component Value Date   TESTOFREE 4.6 (L) 11/25/2017   TESTOSTERONE 336 11/25/2017  Coagulation Parameters Lab Results  Component Value Date   PLT 197 11/25/2017                        Cardiovascular Markers Lab Results  Component Value Date   HGB 14.8 11/25/2017   HCT 41.5 11/25/2017                         CA Markers No results found for: CEA, CA125, LABCA2                      Note: Lab results reviewed.    Meds   Current Outpatient Medications:  .  amLODipine (NORVASC) 5 MG tablet, Take 1 tablet (5 mg total) by mouth daily., Disp: 90 tablet, Rfl: 3 .  aspirin EC 81 MG tablet, Take 81 mg by mouth daily., Disp: , Rfl:  .   COENZYME Q-10 PO, Take 1 capsule by mouth daily. , Disp: , Rfl:  .  fexofenadine (ALLEGRA) 180 MG tablet, Take 180 mg by mouth daily as needed. , Disp: , Rfl:  .  metoprolol tartrate (LOPRESSOR) 25 MG tablet, Take 0.5 tablets (12.5 mg total) by mouth 2 (two) times daily., Disp: 60 tablet, Rfl: 1 .  MULTIPLE VITAMINS PO, Take 1 tablet by mouth daily. , Disp: , Rfl:  .  Omega-3 Fatty Acids (FISH OIL) 1200 MG CAPS, Take 1 capsule by mouth daily. , Disp: , Rfl:  .  rosuvastatin (CRESTOR) 40 MG tablet, TAKE 1 TABLET BY MOUTH  DAILY, Disp: 90 tablet, Rfl: 3 .  zolpidem (AMBIEN) 10 MG tablet, TAKE 1 TABLET BY MOUTH AT  BEDTIME AS NEEDED FOR SLEEP, Disp: 90 tablet, Rfl: 1 .  [START ON 04/08/2018] traMADol (ULTRAM) 50 MG tablet, Take 2 tablets (100 mg total) by mouth 3 (three) times daily as needed for severe pain. For chronic pain To last for 30 days from fill date., Disp: 180 tablet, Rfl: 3  ROS  Constitutional: Denies any fever or chills Gastrointestinal: No reported hemesis, hematochezia, vomiting, or acute GI distress Musculoskeletal: Denies any acute onset joint swelling, redness, loss of ROM, or weakness Neurological: No reported episodes of acute onset apraxia, aphasia, dysarthria, agnosia, amnesia, paralysis, loss of coordination, or loss of consciousness  Allergies  Mr. Yom is allergic to penicillins.  [place]  Drug: Mr. Schaben  reports that he does not use drugs. Alcohol:  reports that he drinks alcohol. Tobacco:  reports that he has never smoked. He has never used smokeless tobacco. Medical:  has a past medical history of cardiac catheterization, Hyperlipidemia, and Hypertension. Surgical: Mr. Kopko  has a past surgical history that includes Coronary artery bypass graft; Hernia repair; and Appendectomy. Family: family history includes Colon cancer in his sister; Diabetes in his father; Hyperlipidemia in his mother; Hypertension in his father; Thyroid cancer in his sister.  Constitutional Exam   General appearance: Well nourished, well developed, and well hydrated. In no apparent acute distress Vitals:   04/06/18 0943  BP: 118/85  Pulse: 72  Resp: 16  Temp: 98.5 F (36.9 C)  TempSrc: Oral  SpO2: 100%  Weight: 171 lb (77.6 kg)  Height: '5\' 11"'$  (1.803 m)   BMI Assessment: Estimated body mass index is 23.85 kg/m as calculated from the following:   Height as of this encounter: '5\' 11"'$  (1.803 m).   Weight as of this encounter: 171 lb (77.6 kg).  BMI interpretation table: BMI level  Category Range association with higher incidence of chronic pain  <18 kg/m2 Underweight   18.5-24.9 kg/m2 Ideal body weight   25-29.9 kg/m2 Overweight Increased incidence by 20%  30-34.9 kg/m2 Obese (Class I) Increased incidence by 68%  35-39.9 kg/m2 Severe obesity (Class II) Increased incidence by 136%  >40 kg/m2 Extreme obesity (Class III) Increased incidence by 254%   Patient's current BMI Ideal Body weight  Body mass index is 23.85 kg/m. Ideal body weight: 75.3 kg (166 lb 0.1 oz) Adjusted ideal body weight: 76.2 kg (168 lb 0.1 oz)   BMI Readings from Last 4 Encounters:  04/06/18 23.85 kg/m  02/04/18 24.41 kg/m  11/24/17 24.97 kg/m  11/24/17 24.97 kg/m   Wt Readings from Last 4 Encounters:  04/06/18 171 lb (77.6 kg)  02/04/18 175 lb (79.4 kg)  11/24/17 179 lb (81.2 kg)  11/24/17 179 lb (81.2 kg)  Psych/Mental status: Alert, oriented x 3 (person, place, & time)       Eyes: PERLA Respiratory: No evidence of acute respiratory distress  Cervical Spine Area Exam  Skin & Axial Inspection: No masses, redness, edema, swelling, or associated skin lesions Alignment: Symmetrical Functional ROM: Unrestricted ROM      Stability: No instability detected Muscle Tone/Strength: Functionally intact. No obvious neuro-muscular anomalies detected. Sensory (Neurological): Unimpaired Palpation: No palpable anomalies              Upper Extremity (UE) Exam    Side: Right upper extremity  Side:  Left upper extremity  Skin & Extremity Inspection: Skin color, temperature, and hair growth are WNL. No peripheral edema or cyanosis. No masses, redness, swelling, asymmetry, or associated skin lesions. No contractures.  Skin & Extremity Inspection: Skin color, temperature, and hair growth are WNL. No peripheral edema or cyanosis. No masses, redness, swelling, asymmetry, or associated skin lesions. No contractures.  Functional ROM: Unrestricted ROM          Functional ROM: Unrestricted ROM          Muscle Tone/Strength: Functionally intact. No obvious neuro-muscular anomalies detected.  Muscle Tone/Strength: Functionally intact. No obvious neuro-muscular anomalies detected.  Sensory (Neurological): Unimpaired          Sensory (Neurological): Unimpaired          Palpation: No palpable anomalies              Palpation: No palpable anomalies              Provocative Test(s):  Phalen's test: deferred Tinel's test: deferred Apley's scratch test (touch opposite shoulder):  Action 1 (Across chest): deferred Action 2 (Overhead): deferred Action 3 (LB reach): deferred   Provocative Test(s):  Phalen's test: deferred Tinel's test: deferred Apley's scratch test (touch opposite shoulder):  Action 1 (Across chest): deferred Action 2 (Overhead): deferred Action 3 (LB reach): deferred    Thoracic Spine Area Exam  Skin & Axial Inspection: No masses, redness, or swelling Alignment: Symmetrical Functional ROM: Unrestricted ROM Stability: No instability detected Muscle Tone/Strength: Functionally intact. No obvious neuro-muscular anomalies detected. Sensory (Neurological): Unimpaired Muscle strength & Tone: No palpable anomalies  Lumbar Spine Area Exam  Skin & Axial Inspection: No masses, redness, or swelling Alignment: Symmetrical Functional ROM: Unrestricted ROM       Stability: No instability detected Muscle Tone/Strength: Functionally intact. No obvious neuro-muscular anomalies detected. Sensory  (Neurological): Musculoskeletal pain pattern Palpation: No palpable anomalies       Provocative Tests: Lumbar Hyperextension/rotation test: (+) bilaterally for facet joint pain. Lumbar quadrant test (Kemp's test):  deferred today       Lumbar Lateral bending test: deferred today       Patrick's Maneuver: deferred today                   FABER test: deferred today       Thigh-thrust test: deferred today       S-I compression test: deferred today       S-I distraction test: deferred today        Gait & Posture Assessment  Ambulation: Unassisted Gait: Relatively normal for age and body habitus Posture: WNL   Lower Extremity Exam    Side: Right lower extremity  Side: Left lower extremity  Stability: No instability observed          Stability: No instability observed          Skin & Extremity Inspection: Skin color, temperature, and hair growth are WNL. No peripheral edema or cyanosis. No masses, redness, swelling, asymmetry, or associated skin lesions. No contractures.  Skin & Extremity Inspection: Skin color, temperature, and hair growth are WNL. No peripheral edema or cyanosis. No masses, redness, swelling, asymmetry, or associated skin lesions. No contractures.  Functional ROM: Unrestricted ROM                  Functional ROM: Unrestricted ROM                  Muscle Tone/Strength: Functionally intact. No obvious neuro-muscular anomalies detected.  Muscle Tone/Strength: Functionally intact. No obvious neuro-muscular anomalies detected.  Sensory (Neurological): Unimpaired  Sensory (Neurological): Unimpaired  Palpation: No palpable anomalies  Palpation: No palpable anomalies   Assessment  Primary Diagnosis & Pertinent Problem List: The primary encounter diagnosis was Lumbar radiculopathy. Diagnoses of Chronic midline low back pain without sciatica, Lumbar facet arthropathy, Chronic pain syndrome, Lumbar degenerative disc disease, and Piriformis muscle pain were also pertinent to this  visit.  Status Diagnosis  Controlled Controlled Controlled 1. Lumbar radiculopathy   2. Chronic midline low back pain without sciatica   3. Lumbar facet arthropathy   4. Chronic pain syndrome   5. Lumbar degenerative disc disease   6. Piriformis muscle pain      General Recommendations: The pain condition that the patient suffers  from is best treated with a multidisciplinary approach that involves an increase in physical activity to prevent de-conditioning and worsening of the pain cycle, as well as psychological counseling (formal and/or informal) to address the co-morbid psychological affects of pain. Treatment will often involve judicious use of pain medications and interventional procedures to decrease the pain, allowing the patient to participate in the physical activity that will ultimately produce long-lasting pain reductions. The goal of the multidisciplinary approach is to return the patient to a higher level of overall function and to restore their ability to perform activities of daily living.  57 year old male with axial low back pain that radiates into bilateral buttocks and posterior thighs, onset and duration approximately 3 years. Patient's lumbar MRI is significant for lumbar spondylosis, facet arthropathy and L3-L4 disc bulge. Patient has had 2 diagnostic lumbar medial branch nerve blocks which were not effective. He has undergone physical therapy and did not obtain any significant benefit. Has been obtaining benefit with current dose of Tramadol 100 mg TID prn. Here for med refill. PMP checked and appropriate.   Plan of Care  Pharmacotherapy (Medications Ordered): Meds ordered this encounter  Medications  . traMADol (ULTRAM) 50 MG tablet    Sig:  Take 2 tablets (100 mg total) by mouth 3 (three) times daily as needed for severe pain. For chronic pain To last for 30 days from fill date.    Dispense:  180 tablet    Refill:  3    Do not place this medication, or any other  prescription from our practice, on "Automatic Refill". Patient may have prescription filled one day early if pharmacy is closed on scheduled refill date.   Time Note: Greater than 50% of the 25 minute(s) of face-to-face time spent with Mr. Harbor, was spent in counseling/coordination of care regarding: Mr. Hearns primary cause of pain, the treatment plan, medication side effects, the appropriate use of his medications, the medication agreement and the patient's responsibilities when it comes to controlled substances.   Provider-requested follow-up: Return in about 4 months (around 08/07/2018) for Medication Management.  Future Appointments  Date Time Provider Department Center  05/25/2018  8:20 AM Jerrol Banana., MD [facility] None  08/03/2018  9:45 AM Gillis Santa, MD [facility] None    Primary Care Physician: Jerrol Banana., MD Location: [facility] Note by: Gillis Santa, M.D Date: 04/06/2018; Time: 10:13 AM  There are no Patient Instructions on file for this visit.

## 2018-04-20 ENCOUNTER — Other Ambulatory Visit: Payer: Self-pay

## 2018-04-21 ENCOUNTER — Other Ambulatory Visit: Payer: Self-pay | Admitting: Family Medicine

## 2018-04-21 DIAGNOSIS — I10 Essential (primary) hypertension: Secondary | ICD-10-CM

## 2018-04-21 MED ORDER — AMLODIPINE BESYLATE 5 MG PO TABS: 5.0000 mg | ORAL_TABLET | Freq: Every day | ORAL | 3 refills | Status: DC

## 2018-04-21 NOTE — Telephone Encounter (Signed)
[facility] pharmacy faxed a refill request for the following medication. Thanks CC  amLODipine (NORVASC) 5 MG tablet

## 2018-04-21 NOTE — Telephone Encounter (Signed)
Please review. Thanks!  

## 2018-04-22 ENCOUNTER — Encounter: Payer: Self-pay | Admitting: Family Medicine

## 2018-04-22 ENCOUNTER — Ambulatory Visit (INDEPENDENT_AMBULATORY_CARE_PROVIDER_SITE_OTHER): Payer: 59 | Admitting: Family Medicine

## 2018-04-22 VITALS — BP 104/68 | HR 68 | Temp 98.6°F | Resp 16 | Ht 71.0 in | Wt 167.0 lb

## 2018-04-22 DIAGNOSIS — K648 Other hemorrhoids: Secondary | ICD-10-CM

## 2018-04-22 DIAGNOSIS — R634 Abnormal weight loss: Secondary | ICD-10-CM

## 2018-04-22 DIAGNOSIS — I2510 Atherosclerotic heart disease of native coronary artery without angina pectoris: Secondary | ICD-10-CM | POA: Diagnosis not present

## 2018-04-22 DIAGNOSIS — K602 Anal fissure, unspecified: Secondary | ICD-10-CM

## 2018-04-22 MED ORDER — HYDROCORTISONE ACETATE 25 MG RE SUPP: 25.0000 mg | Freq: Every evening | RECTAL | 3 refills | Status: DC | PRN

## 2018-04-22 NOTE — Progress Notes (Signed)
Patient: Shawn Crawford Male    DOB: 1961-03-26   58 y.o.   MRN: [id number] Visit Date: 04/22/2018  Today's Provider: [name], MD   Chief Complaint  Patient presents with  . Anal Fissure   Subjective:    HPI Patient comes in today to discuss an anal fissure. He reports that he has had symptoms for about 5 weeks. He reports that he has had this before, but it has never lasted this long. He has used preparation H with no relief.     Allergies  Allergen Reactions  . Penicillins Itching     Current Outpatient Medications:  .  amLODipine (NORVASC) 5 MG tablet, Take 1 tablet (5 mg total) by mouth daily., Disp: 90 tablet, Rfl: 3 .  aspirin EC 81 MG tablet, Take 81 mg by mouth daily., Disp: , Rfl:  .  COENZYME Q-10 PO, Take 1 capsule by mouth daily. , Disp: , Rfl:  .  fexofenadine (ALLEGRA) 180 MG tablet, Take 180 mg by mouth daily as needed. , Disp: , Rfl:  .  metoprolol tartrate (LOPRESSOR) 25 MG tablet, Take 0.5 tablets (12.5 mg total) by mouth 2 (two) times daily., Disp: 60 tablet, Rfl: 1 .  MULTIPLE VITAMINS PO, Take 1 tablet by mouth daily. , Disp: , Rfl:  .  Omega-3 Fatty Acids (FISH OIL) 1200 MG CAPS, Take 1 capsule by mouth daily. , Disp: , Rfl:  .  rosuvastatin (CRESTOR) 40 MG tablet, TAKE 1 TABLET BY MOUTH  DAILY, Disp: 90 tablet, Rfl: 3 .  traMADol (ULTRAM) 50 MG tablet, Take 2 tablets (100 mg total) by mouth 3 (three) times daily as needed for severe pain. For chronic pain To last for 30 days from fill date., Disp: 180 tablet, Rfl: 3 .  zolpidem (AMBIEN) 10 MG tablet, TAKE 1 TABLET BY MOUTH AT  BEDTIME AS NEEDED FOR SLEEP, Disp: 90 tablet, Rfl: 1  Review of Systems  Constitutional: Negative for activity change, appetite change, chills, diaphoresis, fatigue, fever and unexpected weight change.  HENT: Negative.   Eyes: Negative.   Respiratory: Negative for cough and shortness of breath.   Cardiovascular: Negative for chest pain, palpitations and leg  swelling.  Gastrointestinal: Positive for anal bleeding, blood in stool and rectal pain. Negative for constipation, diarrhea, nausea and vomiting.  Endocrine: Negative.   Allergic/Immunologic: Negative.   Psychiatric/Behavioral: Negative.     Social History   Tobacco Use  . Smoking status: Never Smoker  . Smokeless tobacco: Never Used  Substance Use Topics  . Alcohol use: Yes    Alcohol/week: 0.0 oz    Comment: glass of wine occasionally   Objective:   BP 104/68 (BP Location: Right Arm, Patient Position: Sitting, Cuff Size: Normal)   Pulse 68   Temp 98.6 F (37 C)   Resp 16   Ht 5\' 11"  (1.803 m)   Wt 167 lb (75.8 kg)   SpO2 100%   BMI 23.29 kg/m  Vitals:   04/22/18 0812  BP: 104/68  Pulse: 68  Resp: 16  Temp: 98.6 F (37 C)  SpO2: 100%  Weight: 167 lb (75.8 kg)  Height: 5\' 11"  (1.803 m)     Physical Exam  Constitutional: He is oriented to person, place, and time. He appears well-developed and well-nourished.  HENT:  Head: Normocephalic and atraumatic.  Right Ear: External ear normal.  Left Ear: External ear normal.  Nose: Nose normal.  Eyes: Conjunctivae are normal. No scleral  icterus.  Neck: No thyromegaly present.  Cardiovascular: Normal rate, regular rhythm and normal heart sounds.  Pulmonary/Chest: Effort normal and breath sounds normal.  Abdominal: Soft.  Musculoskeletal: He exhibits no edema.  Neurological: He is alert and oriented to person, place, and time.  Skin: Skin is warm and dry.  Psychiatric: He has a normal mood and affect. His behavior is normal. Judgment and thought content normal.        Assessment & Plan:     1. Anal fissure  - hydrocortisone (ANUSOL-HC) 25 MG suppository; Place 1 suppository (25 mg total) rectally at bedtime as needed for hemorrhoids.  Dispense: 12 suppository; Refill: 3  2. Internal hemorrhoids  - hydrocortisone (ANUSOL-HC) 25 MG suppository; Place 1 suppository (25 mg total) rectally at bedtime as needed for  hemorrhoids.  Dispense: 12 suppository; Refill: 3 - CBC with Differential/Platelet  3. Weight loss, unintentional Consider Ct if continues.May need GI referral also. - Comprehensive metabolic panel - TSH 4.s/p CABG       I have done the exam and reviewed the above chart and it is accurate to the best of my knowledge. Development worker, community has been used in this note in any air is in the dictation or transcription are unintentional.  [name], MD  [place]

## 2018-04-23 ENCOUNTER — Encounter: Payer: Self-pay | Admitting: Family Medicine

## 2018-04-23 LAB — CBC WITH DIFFERENTIAL/PLATELET
BASOS ABS: 0.1 10*3/uL (ref 0.0–0.2)
Basos: 1 %
EOS (ABSOLUTE): 0.1 10*3/uL (ref 0.0–0.4)
EOS: 1 %
HEMATOCRIT: 41.7 % (ref 37.5–51.0)
HEMOGLOBIN: 14 g/dL (ref 13.0–17.7)
IMMATURE GRANS (ABS): 0 10*3/uL (ref 0.0–0.1)
Immature Granulocytes: 0 %
LYMPHS ABS: 0.9 10*3/uL (ref 0.7–3.1)
LYMPHS: 13 %
MCH: 30.1 pg (ref 26.6–33.0)
MCHC: 33.6 g/dL (ref 31.5–35.7)
MCV: 90 fL (ref 79–97)
MONOCYTES: 8 %
Monocytes Absolute: 0.5 10*3/uL (ref 0.1–0.9)
NEUTROS ABS: 5.4 10*3/uL (ref 1.4–7.0)
Neutrophils: 77 %
PLATELETS: 177 10*3/uL (ref 150–450)
RBC: 4.65 x10E6/uL (ref 4.14–5.80)
RDW: 11.8 % — ABNORMAL LOW (ref 12.3–15.4)
WBC: 7 10*3/uL (ref 3.4–10.8)

## 2018-04-23 LAB — COMPREHENSIVE METABOLIC PANEL
ALK PHOS: 61 IU/L (ref 39–117)
ALT: 30 IU/L (ref 0–44)
AST: 26 IU/L (ref 0–40)
Albumin/Globulin Ratio: 2.5 — ABNORMAL HIGH (ref 1.2–2.2)
Albumin: 4.9 g/dL (ref 3.5–5.5)
BILIRUBIN TOTAL: 0.4 mg/dL (ref 0.0–1.2)
BUN/Creatinine Ratio: 11 (ref 9–20)
BUN: 13 mg/dL (ref 6–24)
CHLORIDE: 101 mmol/L (ref 96–106)
CO2: 23 mmol/L (ref 20–29)
Calcium: 10 mg/dL (ref 8.7–10.2)
Creatinine, Ser: 1.19 mg/dL (ref 0.76–1.27)
GFR calc Af Amer: 78 mL/min/{1.73_m2} (ref >59)
GFR calc non Af Amer: 67 mL/min/{1.73_m2} (ref >59)
GLUCOSE: 106 mg/dL — AB (ref 65–99)
Globulin, Total: 2 g/dL (ref 1.5–4.5)
Potassium: 4.8 mmol/L (ref 3.5–5.2)
Sodium: 142 mmol/L (ref 134–144)
TOTAL PROTEIN: 6.9 g/dL (ref 6.0–8.5)

## 2018-04-23 LAB — TSH: TSH: 1.66 u[IU]/mL (ref 0.450–4.500)

## 2018-04-27 ENCOUNTER — Other Ambulatory Visit: Payer: Self-pay

## 2018-04-27 DIAGNOSIS — R634 Abnormal weight loss: Secondary | ICD-10-CM

## 2018-04-29 ENCOUNTER — Telehealth: Payer: Self-pay | Admitting: Family Medicine

## 2018-04-29 ENCOUNTER — Other Ambulatory Visit: Payer: Self-pay

## 2018-04-29 DIAGNOSIS — R634 Abnormal weight loss: Secondary | ICD-10-CM

## 2018-04-29 NOTE — Telephone Encounter (Signed)
Per [facility] both CT of chest and CT of abd/pelvis should be with contrast only.I will have to get a new auth.  for these different codes

## 2018-04-29 NOTE — Telephone Encounter (Signed)
Done

## 2018-05-05 ENCOUNTER — Ambulatory Visit
Admission: RE | Admit: 2018-05-05 | Discharge: 2018-05-05 | Disposition: A | Discharge status: Home / Self Care | Payer: 59 | Source: Ambulatory Visit | Attending: Family Medicine | Admitting: Family Medicine

## 2018-05-05 DIAGNOSIS — K5790 Diverticulosis of intestine, part unspecified, without perforation or abscess without bleeding: Secondary | ICD-10-CM | POA: Diagnosis not present

## 2018-05-05 DIAGNOSIS — J984 Other disorders of lung: Secondary | ICD-10-CM | POA: Diagnosis not present

## 2018-05-05 DIAGNOSIS — R109 Unspecified abdominal pain: Secondary | ICD-10-CM | POA: Diagnosis not present

## 2018-05-05 DIAGNOSIS — I700 Atherosclerosis of aorta: Secondary | ICD-10-CM | POA: Insufficient documentation

## 2018-05-05 DIAGNOSIS — Z951 Presence of aortocoronary bypass graft: Secondary | ICD-10-CM | POA: Diagnosis not present

## 2018-05-05 DIAGNOSIS — R634 Abnormal weight loss: Secondary | ICD-10-CM | POA: Diagnosis present

## 2018-05-05 MED ORDER — IOHEXOL 300 MG/ML  SOLN
100.0000 mL | Freq: Once | INTRAMUSCULAR | Status: AC | PRN
  Administered 2018-05-05: 100 mL via INTRAVENOUS

## 2018-05-07 ENCOUNTER — Telehealth: Payer: Self-pay | Admitting: Family Medicine

## 2018-05-07 DIAGNOSIS — R634 Abnormal weight loss: Secondary | ICD-10-CM

## 2018-05-07 NOTE — Telephone Encounter (Signed)
Pt wants to know  If we have received the results back form his CT chest that he had done on Wednesday.  He knows Dr. Rosanna Randy is out of the office until Monday and can wait.   CB# [phone]  Thanks Con Memos

## 2018-05-10 NOTE — Telephone Encounter (Signed)
Advised patient of results. It was recommended by Dr. Caryn Section that we refer to GI for further evaluation. Patient is willing to see GI. Order has been placed.

## 2018-05-10 NOTE — Progress Notes (Signed)
Patient viewed in mychart.

## 2018-05-24 ENCOUNTER — Other Ambulatory Visit: Payer: Self-pay | Admitting: Family Medicine

## 2018-05-25 ENCOUNTER — Ambulatory Visit: Payer: Self-pay | Admitting: Family Medicine

## 2018-06-10 ENCOUNTER — Encounter: Payer: Self-pay | Admitting: Gastroenterology

## 2018-06-10 ENCOUNTER — Ambulatory Visit (INDEPENDENT_AMBULATORY_CARE_PROVIDER_SITE_OTHER): Payer: 59 | Admitting: Gastroenterology

## 2018-06-10 VITALS — BP 115/67 | HR 71 | Ht 71.0 in | Wt 169.6 lb

## 2018-06-10 DIAGNOSIS — R634 Abnormal weight loss: Secondary | ICD-10-CM

## 2018-06-10 NOTE — Progress Notes (Signed)
Shawn Crawford [address]  [place]  [place], [place] [address]  Main: [phone]  Fax: [phone]   Gastroenterology Consultation  Referring Provider:     Jerrol Banana.,* Primary Care Physician:  Jerrol Banana., MD Primary Gastroenterologist:  Dr. Vonda Crawford Reason for Consultation:     Weight loss        HPI:    Chief Complaint  Patient presents with  . New Patient (Initial Visit)    referred by [facility] for unexplained weight loss    Shawn Crawford is a 57 y.o. y/o male referred for consultation & management  by Dr. Rosanna Randy, Retia Passe., MD.  Patient has been referred by primary care provider due to unexplained weight loss.  20 pound weight loss over the last year.  Patient otherwise has no symptoms.  No abdominal pain, heartburn, dysphagia, altered bowel habits, blood in stool, melena, heartburn.  He reports a good appetite.  Has not been trying to lose weight.  Has not increased his activity or exercise level.  States he does not think he has decreased his p.o. intake, however does state that he usually does not eat a heavy or full meal, and usually does get full on small meals, but that is normal for him.  He otherwise appears very healthy.  Colonoscopy 2012 normal except internal hemorrhoids.  Past Medical History:  Diagnosis Date  . Hx of cardiac catheterization   . Hyperlipidemia   . Hypertension     Past Surgical History:  Procedure Laterality Date  . APPENDECTOMY    . CORONARY ARTERY BYPASS GRAFT     x 3  . HERNIA REPAIR      Prior to Admission medications   Medication Sig Start Date End Date Taking? Authorizing Provider  amLODipine (NORVASC) 5 MG tablet Take 1 tablet (5 mg total) by mouth daily. 04/21/18  Yes Jerrol Banana., MD  aspirin EC 81 MG tablet Take 81 mg by mouth daily.   Yes [provider]  COENZYME Q-10 PO Take 1 capsule by mouth daily.  07/31/11  Yes [provider]  fexofenadine (ALLEGRA) 180 MG tablet Take 180 mg by mouth daily as needed.    Yes [provider]  hydrocortisone (ANUSOL-HC) 25 MG suppository Place 1 suppository (25 mg total) rectally at bedtime as needed for hemorrhoids. 04/22/18  Yes Jerrol Banana., MD  metoprolol tartrate (LOPRESSOR) 25 MG tablet Take 0.5 tablets (12.5 mg total) by mouth 2 (two) times daily. 10/15/17  Yes Jerrol Banana., MD  MULTIPLE VITAMINS PO Take 1 tablet by mouth daily.  10/22/11  Yes [provider]  Omega-3 Fatty Acids (FISH OIL) 1200 MG CAPS Take 1 capsule by mouth daily.  10/22/11  Yes [provider]  rosuvastatin (CRESTOR) 40 MG tablet TAKE 1 TABLET BY MOUTH  DAILY 12/12/17  Yes Jerrol Banana., MD  traMADol (ULTRAM) 50 MG tablet Take 2 tablets (100 mg total) by mouth 3 (three) times daily as needed for severe pain. For chronic pain To last for 30 days from fill date. 04/08/18  Yes Gillis Santa, MD  zolpidem (AMBIEN) 10 MG tablet TAKE 1 TABLET BY MOUTH AT  BEDTIME AS NEEDED FOR SLEEP 05/27/18  Yes Jerrol Banana., MD    Family History  Problem Relation Age of Onset  . Hyperlipidemia Mother   . Diabetes Father   . Hypertension Father   .  Thyroid cancer Sister   . Colon cancer Sister      Social History   Tobacco Use  . Smoking status: Never Smoker  . Smokeless tobacco: Never Used  Substance Use Topics  . Alcohol use: Yes    Alcohol/week: 0.0 standard drinks    Comment: glass of wine occasionally  . Drug use: No    Allergies as of 06/10/2018 - Review Complete 06/10/2018  Allergen Reaction Noted  . Penicillins Itching 05/10/2015    Review of Systems:    All systems reviewed and negative except where noted in HPI.   Physical Exam:  BP 115/67   Pulse 71   Ht 5\' 11"  (1.803 m)   Wt 169 lb 9.6 oz (76.9 kg)   BMI 23.65 kg/m  No LMP for male patient. Psych:  Alert and cooperative. Normal mood and affect. General:   Alert,   Well-developed, well-nourished, pleasant and cooperative in NAD, no cachexia Head:  Normocephalic and atraumatic. Eyes:  Sclera clear, no icterus.   Conjunctiva pink. Ears:  Normal auditory acuity. Nose:  No deformity, discharge, or lesions. Mouth:  No deformity or lesions,oropharynx pink & moist. Neck:  Supple; no masses or thyromegaly. Abdomen:  Normal bowel sounds.  No bruits.  Soft, non-tender and non-distended without masses, hepatosplenomegaly or hernias noted.  No guarding or rebound tenderness.    Msk:  Symmetrical without gross deformities. Good, equal movement & strength bilaterally. Pulses:  Normal pulses noted. Extremities:  No clubbing or edema.  No cyanosis. Neurologic:  Alert and oriented x3;  grossly normal neurologically. Skin:  Intact without significant lesions or rashes. No jaundice. Lymph Nodes:  No significant cervical adenopathy. Psych:  Alert and cooperative. Normal mood and affect.   Labs: CBC    Component Value Date/Time   WBC 7.0 04/22/2018 0911   WBC 7.1 07/08/2017 1006   RBC 4.65 04/22/2018 0911   RBC 4.99 07/08/2017 1006   HGB 14.0 04/22/2018 0911   HCT 41.7 04/22/2018 0911   PLT 177 04/22/2018 0911   MCV 90 04/22/2018 0911   MCH 30.1 04/22/2018 0911   MCH 30.5 07/08/2017 1006   MCHC 33.6 04/22/2018 0911   MCHC 35.3 07/08/2017 1006   RDW 11.8 (L) 04/22/2018 0911   LYMPHSABS 0.9 04/22/2018 0911   EOSABS 0.1 04/22/2018 0911   BASOSABS 0.1 04/22/2018 0911   CMP     Component Value Date/Time   NA 142 04/22/2018 0911   K 4.8 04/22/2018 0911   CL 101 04/22/2018 0911   CO2 23 04/22/2018 0911   GLUCOSE 106 (H) 04/22/2018 0911   BUN 13 04/22/2018 0911   CREATININE 1.19 04/22/2018 0911   CALCIUM 10.0 04/22/2018 0911   PROT 6.9 04/22/2018 0911   ALBUMIN 4.9 04/22/2018 0911   AST 26 04/22/2018 0911   ALT 30 04/22/2018 0911   ALKPHOS 61 04/22/2018 0911   BILITOT 0.4 04/22/2018 0911   GFRNONAA 67 04/22/2018 0911   GFRAA 78 04/22/2018 0911     Imaging Studies: CT report reviewed from 2018/06/09  Assessment and Plan:   Shawn Crawford is a 57 y.o. y/o male has been referred for unexplained weight loss but otherwise asymptomatic  We will schedule for screening colonoscopy and also rule out any lesions due to the Weight loss We will also schedule EGD due to unexplained weight loss  CT abdomen pelvis, and lab work reassuring  If EGD and colonoscopy are negative and since CT has been negative, and patient is completely asymptomatic  and appears healthy with no cachexia, patient's weight loss may just be related to him being used to eating small meals  Primary care provider can consider referral to endocrinology if further work-up for weight loss is needed.  Will defer this to primary care provider.  I have discussed alternative options, risks & benefits,  which include, but are not limited to, bleeding, infection, perforation,respiratory complication & drug reaction.  The patient agrees with this plan & written consent will be obtained.     Dr Shawn Crawford

## 2018-06-11 ENCOUNTER — Other Ambulatory Visit: Payer: Self-pay

## 2018-06-11 DIAGNOSIS — R634 Abnormal weight loss: Secondary | ICD-10-CM

## 2018-06-24 ENCOUNTER — Telehealth: Payer: Self-pay

## 2018-06-24 ENCOUNTER — Ambulatory Visit
Admission: RE | Admit: 2018-06-24 | Discharge: 2018-06-24 | Disposition: A | Discharge status: Home / Self Care | Payer: 59 | Source: Ambulatory Visit | Attending: Gastroenterology | Admitting: Gastroenterology

## 2018-06-24 ENCOUNTER — Ambulatory Visit: Payer: 59 | Admitting: Anesthesiology

## 2018-06-24 ENCOUNTER — Encounter: Payer: Self-pay | Admitting: *Deleted

## 2018-06-24 ENCOUNTER — Encounter
Admission: RE | Disposition: A | Discharge status: Home / Self Care | Payer: Self-pay | Source: Ambulatory Visit | Attending: Gastroenterology

## 2018-06-24 DIAGNOSIS — I2510 Atherosclerotic heart disease of native coronary artery without angina pectoris: Secondary | ICD-10-CM | POA: Diagnosis not present

## 2018-06-24 DIAGNOSIS — K6389 Other specified diseases of intestine: Secondary | ICD-10-CM | POA: Diagnosis not present

## 2018-06-24 DIAGNOSIS — E785 Hyperlipidemia, unspecified: Secondary | ICD-10-CM | POA: Insufficient documentation

## 2018-06-24 DIAGNOSIS — Z7982 Long term (current) use of aspirin: Secondary | ICD-10-CM | POA: Diagnosis not present

## 2018-06-24 DIAGNOSIS — D124 Benign neoplasm of descending colon: Secondary | ICD-10-CM | POA: Diagnosis not present

## 2018-06-24 DIAGNOSIS — R6881 Early satiety: Secondary | ICD-10-CM | POA: Diagnosis not present

## 2018-06-24 DIAGNOSIS — Z79899 Other long term (current) drug therapy: Secondary | ICD-10-CM | POA: Diagnosis not present

## 2018-06-24 DIAGNOSIS — K602 Anal fissure, unspecified: Secondary | ICD-10-CM

## 2018-06-24 DIAGNOSIS — K648 Other hemorrhoids: Secondary | ICD-10-CM | POA: Diagnosis not present

## 2018-06-24 DIAGNOSIS — I10 Essential (primary) hypertension: Secondary | ICD-10-CM | POA: Diagnosis not present

## 2018-06-24 DIAGNOSIS — K2950 Unspecified chronic gastritis without bleeding: Secondary | ICD-10-CM | POA: Diagnosis not present

## 2018-06-24 DIAGNOSIS — K5730 Diverticulosis of large intestine without perforation or abscess without bleeding: Secondary | ICD-10-CM

## 2018-06-24 DIAGNOSIS — K3189 Other diseases of stomach and duodenum: Secondary | ICD-10-CM | POA: Diagnosis not present

## 2018-06-24 DIAGNOSIS — Z951 Presence of aortocoronary bypass graft: Secondary | ICD-10-CM | POA: Insufficient documentation

## 2018-06-24 DIAGNOSIS — K635 Polyp of colon: Secondary | ICD-10-CM | POA: Diagnosis not present

## 2018-06-24 DIAGNOSIS — Z1211 Encounter for screening for malignant neoplasm of colon: Secondary | ICD-10-CM

## 2018-06-24 DIAGNOSIS — R634 Abnormal weight loss: Secondary | ICD-10-CM

## 2018-06-24 HISTORY — PX: COLONOSCOPY WITH PROPOFOL: SHX5780

## 2018-06-24 HISTORY — PX: ESOPHAGOGASTRODUODENOSCOPY (EGD) WITH PROPOFOL: SHX5813

## 2018-06-24 SURGERY — COLONOSCOPY WITH PROPOFOL
Anesthesia: General

## 2018-06-24 MED ORDER — SODIUM CHLORIDE 0.9 % IV SOLN
INTRAVENOUS | Status: DC
  Administered 2018-06-24: 1000 mL via INTRAVENOUS

## 2018-06-24 MED ORDER — LIDOCAINE HCL (PF) 2 % IJ SOLN
INTRAMUSCULAR | Status: AC
  Filled 2018-06-24: qty 10

## 2018-06-24 MED ORDER — LIDOCAINE HCL (CARDIAC) PF 100 MG/5ML IV SOSY
PREFILLED_SYRINGE | INTRAVENOUS | Status: DC | PRN
  Administered 2018-06-24: 100 mg via INTRAVENOUS

## 2018-06-24 MED ORDER — PROPOFOL 10 MG/ML IV BOLUS
INTRAVENOUS | Status: AC
  Filled 2018-06-24: qty 20

## 2018-06-24 MED ORDER — GLYCOPYRROLATE PF 0.2 MG/ML IJ SOSY
PREFILLED_SYRINGE | INTRAMUSCULAR | Status: DC | PRN
  Administered 2018-06-24: .2 mg via INTRAVENOUS

## 2018-06-24 MED ORDER — PROPOFOL 10 MG/ML IV BOLUS
INTRAVENOUS | Status: DC | PRN
  Administered 2018-06-24: 80 mg via INTRAVENOUS
  Administered 2018-06-24: 50 mg via INTRAVENOUS

## 2018-06-24 MED ORDER — PROPOFOL 500 MG/50ML IV EMUL
INTRAVENOUS | Status: DC | PRN
  Administered 2018-06-24: 160 ug/kg/min via INTRAVENOUS

## 2018-06-24 MED ORDER — GLYCOPYRROLATE 0.2 MG/ML IJ SOLN
INTRAMUSCULAR | Status: AC
  Filled 2018-06-24: qty 1

## 2018-06-24 NOTE — Telephone Encounter (Addendum)
-----   Message from Virgel Manifold, MD sent at 06/24/2018  9:39 AM EDT ----- Can we contact patient's pharmacy or a compounding pharmacy and prescribed patient nifidepine ointment 0.2% twice daily for 2 weeks   I called in Rx to [facility] in [place], who do compounding.

## 2018-06-24 NOTE — Anesthesia Preprocedure Evaluation (Signed)
Anesthesia Evaluation  Patient identified by MRN, date of birth, ID band Patient awake    Reviewed: Allergy & Precautions, H&P , NPO status , Patient's Chart, lab work & pertinent test results  History of Anesthesia Complications Negative for: history of anesthetic complications  Airway Mallampati: II  TM Distance: >3 FB Neck ROM: full    Dental  (+) Chipped   Pulmonary neg pulmonary ROS, neg shortness of breath,           Cardiovascular Exercise Tolerance: Good hypertension, (-) angina+ CAD and + CABG  (-) Past MI and (-) DOE      Neuro/Psych negative neurological ROS  negative psych ROS   GI/Hepatic Neg liver ROS, GERD  Medicated and Controlled,  Endo/Other  negative endocrine ROS  Renal/GU negative Renal ROS  negative genitourinary   Musculoskeletal  (+) Arthritis ,   Abdominal   Peds  Hematology negative hematology ROS (+)   Anesthesia Other Findings Past Medical History: No date: Hx of cardiac catheterization No date: Hyperlipidemia No date: Hypertension  Past Surgical History: No date: APPENDECTOMY No date: CARDIAC CATHETERIZATION No date: CORONARY ARTERY BYPASS GRAFT     Comment:  x 3 No date: HERNIA REPAIR  BMI    Body Mass Index:  23.15 kg/m      Reproductive/Obstetrics negative OB ROS                             Anesthesia Physical Anesthesia Plan  ASA: III  Anesthesia Plan: General   Post-op Pain Management:    Induction: Intravenous  PONV Risk Score and Plan: Propofol infusion and TIVA  Airway Management Planned: Natural Airway and Nasal Cannula  Additional Equipment:   Intra-op Plan:   Post-operative Plan:   Informed Consent: I have reviewed the patients History and Physical, chart, labs and discussed the procedure including the risks, benefits and alternatives for the proposed anesthesia with the patient or authorized representative who has  indicated his/her understanding and acceptance.   Dental Advisory Given  Plan Discussed with: Anesthesiologist, CRNA and Surgeon  Anesthesia Plan Comments: (Patient consented for risks of anesthesia including but not limited to:  - adverse reactions to medications - risk of intubation if required - damage to teeth, lips or other oral mucosa - sore throat or hoarseness - Damage to heart, brain, lungs or loss of life  Patient voiced understanding.)        Anesthesia Quick Evaluation

## 2018-06-24 NOTE — Telephone Encounter (Signed)
Left message to contact office

## 2018-06-24 NOTE — Op Note (Signed)
[facility] Patient Name: Shawn Crawford Procedure Date: 06/24/2018 8:27 AM MRN: [id number] Account #: [id number] Date of Birth: 1960-11-24 Admit Type: Outpatient Age: 57 Room: [facility] ENDO ROOM 2 Gender: Male Note Status: Finalized Procedure:            Upper GI endoscopy Indications:          Early satiety, Weight loss Providers:            Tierra Thoma B. Bonna Gains MD, MD Referring MD:         Janine Ores. Rosanna Randy, MD (Referring MD) Medicines:            Monitored Anesthesia Care Complications:        No immediate complications. Procedure:            Pre-Anesthesia Assessment:                       - Prior to the procedure, a History and Physical was                        performed, and patient medications, allergies and                        sensitivities were reviewed. The patient's tolerance of                        previous anesthesia was reviewed.                       - The risks and benefits of the procedure and the                        sedation options and risks were discussed with the                        patient. All questions were answered and informed                        consent was obtained.                       - Patient identification and proposed procedure were                        verified prior to the procedure by the physician, the                        nurse, the anesthesiologist, the anesthetist and the                        technician. The procedure was verified in the procedure                        room.                       - ASA Grade Assessment: II - A patient with mild                        systemic disease.  After obtaining informed consent, the endoscope was                        passed under direct vision. Throughout the procedure,                        the patient's blood pressure, pulse, and oxygen                        saturations were monitored continuously. The Endoscope                  was introduced through the mouth, and advanced to the                        second part of duodenum. The upper GI endoscopy was                        accomplished with ease. The patient tolerated the                        procedure well. Findings:      The examined esophagus was normal.      The Z-line was regular.      A single localized, 2 mm non-bleeding erosion was found in the gastric       antrum. There were no stigmata of recent bleeding. Biopsies were taken       with a cold forceps for histology.      Patchy mildly erythematous mucosa without bleeding was found in the       gastric antrum. Biopsies were taken with a cold forceps for histology.       Biopsies were obtained in the gastric body, at the incisura and in the       gastric antrum with cold forceps for histology.      The duodenal bulb, second portion of the duodenum and examined duodenum       were normal. Impression:           - Normal esophagus.                       - Z-line regular.                       - Erythematous mucosa in the antrum. Biopsied.                       - Normal duodenal bulb, second portion of the duodenum                        and examined duodenum.                       - Biopsies were obtained in the gastric body, at the                        incisura and in the gastric antrum. Recommendation:       - Await pathology results.                       - Discharge patient to home (with escort).                       -  Advance diet as tolerated.                       - Continue present medications.                       - Patient has a contact number available for                        emergencies. The signs and symptoms of potential                        delayed complications were discussed with the patient.                        Return to normal activities tomorrow. Written discharge                        instructions were provided to the patient.                        - Discharge patient to home (with escort).                       - The findings and recommendations were discussed with                        the patient.                       - The findings and recommendations were discussed with                        the patient's family. Procedure Code(s):    --- Professional ---                       [phone], Esophagogastroduodenoscopy, flexible, transoral;                        with biopsy, single or multiple Diagnosis Code(s):    --- Professional ---                       K31.89, Other diseases of stomach and duodenum                       R68.81, Early satiety                       R63.4, Abnormal weight loss CPT copyright 2017 American Medical Association. All rights reserved. The codes documented in this report are preliminary and upon coder review may  be revised to meet current compliance requirements.  Vonda Antigua, MD Margretta Sidle B. Bonna Gains MD, MD 06/24/2018 8:53:24 AM This report has been signed electronically. Number of Addenda: 0 Note Initiated On: 06/24/2018 8:27 AM Estimated Blood Loss: Estimated blood loss: none.      [facility]

## 2018-06-24 NOTE — Anesthesia Postprocedure Evaluation (Signed)
Anesthesia Post Note  Patient: Shawn Crawford  Procedure(s) Performed: COLONOSCOPY WITH PROPOFOL (N/A ) ESOPHAGOGASTRODUODENOSCOPY (EGD) WITH PROPOFOL (N/A )  Patient location during evaluation: Endoscopy Anesthesia Type: General Level of consciousness: awake and alert Pain management: pain level controlled Vital Signs Assessment: post-procedure vital signs reviewed and stable Respiratory status: spontaneous breathing, nonlabored ventilation, respiratory function stable and patient connected to nasal cannula oxygen Cardiovascular status: blood pressure returned to baseline and stable Postop Assessment: no apparent nausea or vomiting Anesthetic complications: no     Last Vitals:  Vitals:   06/24/18 0931 06/24/18 0941  BP:    Pulse:    Resp:    Temp:    SpO2: 99% 100%    Last Pain:  Vitals:   06/24/18 0931  TempSrc:   PainSc: 0-No pain                 Precious Haws Gwendelyn Lanting

## 2018-06-24 NOTE — Anesthesia Post-op Follow-up Note (Signed)
Anesthesia QCDR form completed.        

## 2018-06-24 NOTE — Telephone Encounter (Addendum)
-----   Message from Virgel Manifold, MD sent at 06/24/2018  9:39 AM EDT ----- Can we contact patient's pharmacy or a compounding pharmacy and prescribed patient nifidepine ointment 0.2% twice daily for 2 weeks  Left message to contact office. Rx called to pharmacy. ([facility], [place])

## 2018-06-24 NOTE — Transfer of Care (Signed)
Immediate Anesthesia Transfer of Care Note  Patient: Shawn Crawford  Procedure(s) Performed: COLONOSCOPY WITH PROPOFOL (N/A ) ESOPHAGOGASTRODUODENOSCOPY (EGD) WITH PROPOFOL (N/A )  Patient Location: Endoscopy Unit  Anesthesia Type:General  Level of Consciousness: sedated  Airway & Oxygen Therapy: Patient Spontanous Breathing and Patient connected to nasal cannula oxygen  Post-op Assessment: Report given to RN and Post -op Vital signs reviewed and stable  Post vital signs: Reviewed and stable  Last Vitals:  Vitals Value Taken Time  BP    Temp 36.2 C 06/24/2018  9:21 AM  Pulse 67 06/24/2018  9:21 AM  Resp 17 06/24/2018  9:21 AM  SpO2 99 % 06/24/2018  9:21 AM  Vitals shown include unvalidated device data.  Last Pain:  Vitals:   06/24/18 0921  TempSrc: Tympanic         Complications: No apparent anesthesia complications

## 2018-06-24 NOTE — H&P (Signed)
Vonda Antigua, MD [address] [place], [place], [place], [address] [address], [place], [place], [place], [address] Phone: [phone]  Fax: [phone]  Primary Care Physician:  Jerrol Banana., MD   Pre-Procedure History & Physical: HPI:  Shawn Crawford is a 57 y.o. male is here for a colonoscopy and EGD.   Past Medical History:  Diagnosis Date  . Hx of cardiac catheterization   . Hyperlipidemia   . Hypertension     Past Surgical History:  Procedure Laterality Date  . APPENDECTOMY    . CARDIAC CATHETERIZATION    . CORONARY ARTERY BYPASS GRAFT     x 3  . HERNIA REPAIR      Prior to Admission medications   Medication Sig Start Date End Date Taking? Authorizing Provider  amLODipine (NORVASC) 5 MG tablet Take 1 tablet (5 mg total) by mouth daily. 04/21/18  Yes Jerrol Banana., MD  aspirin EC 81 MG tablet Take 81 mg by mouth daily.   Yes [provider]  COENZYME Q-10 PO Take 1 capsule by mouth daily.  07/31/11  Yes [provider]  fexofenadine (ALLEGRA) 180 MG tablet Take 180 mg by mouth daily as needed.    Yes [provider]  hydrocortisone (ANUSOL-HC) 25 MG suppository Place 1 suppository (25 mg total) rectally at bedtime as needed for hemorrhoids. 04/22/18  Yes Jerrol Banana., MD  metoprolol tartrate (LOPRESSOR) 25 MG tablet Take 0.5 tablets (12.5 mg total) by mouth 2 (two) times daily. 10/15/17  Yes Jerrol Banana., MD  MULTIPLE VITAMINS PO Take 1 tablet by mouth daily.  10/22/11  Yes [provider]  Omega-3 Fatty Acids (FISH OIL) 1200 MG CAPS Take 1 capsule by mouth daily.  10/22/11  Yes [provider]  rosuvastatin (CRESTOR) 40 MG tablet TAKE 1 TABLET BY MOUTH  DAILY 12/12/17  Yes Jerrol Banana., MD  traMADol (ULTRAM) 50 MG tablet Take 2 tablets (100 mg total) by mouth 3 (three) times daily as needed for severe pain. For chronic pain To last for 30 days from fill date. 04/08/18   Yes Gillis Santa, MD  zolpidem (AMBIEN) 10 MG tablet TAKE 1 TABLET BY MOUTH AT  BEDTIME AS NEEDED FOR SLEEP 05/27/18  Yes Jerrol Banana., MD    Allergies as of 06/11/2018 - Review Complete 06/10/2018  Allergen Reaction Noted  . Penicillins Itching 05/10/2015    Family History  Problem Relation Age of Onset  . Hyperlipidemia Mother   . Diabetes Father   . Hypertension Father   . Thyroid cancer Sister   . Colon cancer Sister     Social History   Socioeconomic History  . Marital status: Single    Spouse name: Not on file  . Number of children: Not on file  . Years of education: Not on file  . Highest education level: Not on file  Occupational History  . Not on file  Social Needs  . Financial resource strain: Not on file  . Food insecurity:    Worry: Not on file    Inability: Not on file  . Transportation needs:    Medical: Not on file    Non-medical: Not on file  Tobacco Use  . Smoking status: Never Smoker  . Smokeless tobacco: Never Used  Substance and Sexual Activity  . Alcohol use: Yes    Alcohol/week: 0.0 standard drinks    Comment: glass of wine occasionally  . Drug  use: No  . Sexual activity: Not on file  Lifestyle  . Physical activity:    Days per week: Not on file    Minutes per session: Not on file  . Stress: Not on file  Relationships  . Social connections:    Talks on phone: Not on file    Gets together: Not on file    Attends religious service: Not on file    Active member of club or organization: Not on file    Attends meetings of clubs or organizations: Not on file    Relationship status: Not on file  . Intimate partner violence:    Fear of current or ex partner: Not on file    Emotionally abused: Not on file    Physically abused: Not on file    Forced sexual activity: Not on file  Other Topics Concern  . Not on file  Social History Narrative  . Not on file    Review of Systems: See HPI, otherwise negative ROS  Physical  Exam: BP 132/88   Pulse 70   Temp (!) 96.8 F (36 C) (Tympanic)   Resp 18   Ht 5\' 11"  (1.803 m)   Wt 75.3 kg   SpO2 100%   BMI 23.15 kg/m  General:   Alert,  pleasant and cooperative in NAD Head:  Normocephalic and atraumatic. Neck:  Supple; no masses or thyromegaly. Lungs:  Clear throughout to auscultation, normal respiratory effort.    Heart:  +S1, +S2, Regular rate and rhythm, No edema. Abdomen:  Soft, nontender and nondistended. Normal bowel sounds, without guarding, and without rebound.   Neurologic:  Alert and  oriented x4;  grossly normal neurologically.  Impression/Plan: Shawn Crawford is here for a colonoscopy to be performed for average risk screening and EGD for unexplained weight loss, early satiety  Risks, benefits, limitations, and alternatives regarding the procedures have been reviewed with the patient.  Questions have been answered.  All parties agreeable.   Virgel Manifold, MD  06/24/2018, 8:28 AM

## 2018-06-24 NOTE — Telephone Encounter (Signed)
Pt left vm he is returning Debbies call

## 2018-06-24 NOTE — Telephone Encounter (Signed)
Pt notified that [facility] will be contacting him when rx is ready.

## 2018-06-24 NOTE — Op Note (Addendum)
[facility] Patient Name: Shawn Crawford Procedure Date: 06/24/2018 8:26 AM MRN: [id number] Account #: [id number] Date of Birth: 1961/06/05 Admit Type: Outpatient Age: 57 Room: [facility] ENDO ROOM 2 Gender: Male Note Status: Finalized Procedure:            Colonoscopy Indications:          Screening for colorectal malignant neoplasm, Incidental                        - Weight loss Providers:            Saivon Prowse B. Bonna Gains MD, MD Medicines:            Monitored Anesthesia Care Complications:        No immediate complications. Procedure:            Pre-Anesthesia Assessment:                       - ASA Grade Assessment: II - A patient with mild                        systemic disease.                       - Prior to the procedure, a History and Physical was                        performed, and patient medications, allergies and                        sensitivities were reviewed. The patient's tolerance of                        previous anesthesia was reviewed.                       - The risks and benefits of the procedure and the                        sedation options and risks were discussed with the                        patient. All questions were answered and informed                        consent was obtained.                       - Patient identification and proposed procedure were                        verified prior to the procedure by the physician, the                        nurse, the anesthesiologist, the anesthetist and the                        technician. The procedure was verified in the procedure                        room.  After obtaining informed consent, the colonoscope was                        passed under direct vision. Throughout the procedure,                        the patient's blood pressure, pulse, and oxygen                        saturations were monitored continuously. The         Colonoscope was introduced through the anus and                        advanced to the the cecum, identified by appendiceal                        orifice and ileocecal valve. The colonoscopy was                        performed with ease. The patient tolerated the                        procedure well. The quality of the bowel preparation                        was good. Findings:      An anal fissure was found on perianal exam.      A patchy area of mild melanosis was found in the ascending colon and in       the cecum. Biopsies were taken with a cold forceps for histology.      A 5 mm polyp was found in the descending colon. The polyp was sessile.       The polyp was removed with a cold snare. Resection and retrieval were       complete.      Multiple diverticula were found in the sigmoid colon and ascending colon.      The exam was otherwise without abnormality.      The rectum, sigmoid colon, descending colon, transverse colon, ascending       colon and cecum appeared normal.      Non-bleeding internal hemorrhoids were found during retroflexion. Impression:           - Anal fissure found on perianal exam.                       - Melanosis in the colon.                       - One 5 mm polyp in the descending colon, removed with                        a cold snare. Resected and retrieved.                       - Diverticulosis in the sigmoid colon and in the                        ascending colon.                       -  The examination was otherwise normal.                       - The rectum, sigmoid colon, descending colon,                        transverse colon, ascending colon and cecum are normal.                       - Non-bleeding internal hemorrhoids. Recommendation:       - High fiber diet.                       - Miralax one to twice a day to maintain 1-2 soft BMs                        daily                       Warm Sitz baths                       We will try  to contact a compounding pharmacy to see if                        nifedipine 0.2% twice daily can be prescribed to the                        patient                       - Discharge patient to home (with escort).                       - Advance diet as tolerated.                       - Continue present medications.                       - Await pathology results.                       - Repeat colonoscopy in 5 years for surveillance.                       - The findings and recommendations were discussed with                        the patient.                       - The findings and recommendations were discussed with                        the patient's family.                       - Return to primary care physician as previously                        scheduled. Procedure Code(s):    --- Professional ---  45385, Colonoscopy, flexible; with removal of tumor(s),                        polyp(s), or other lesion(s) by snare technique                       45380, 59, Colonoscopy, flexible; with biopsy, single                        or multiple Diagnosis Code(s):    --- Professional ---                       Z12.11, Encounter for screening for malignant neoplasm                        of colon                       K60.2, Anal fissure, unspecified                       K63.89, Other specified diseases of intestine                       D12.4, Benign neoplasm of descending colon                       K64.8, Other hemorrhoids                       K57.30, Diverticulosis of large intestine without                        perforation or abscess without bleeding CPT copyright 2017 American Medical Association. All rights reserved. The codes documented in this report are preliminary and upon coder review may  be revised to meet current compliance requirements.  Vonda Antigua, MD Margretta Sidle B. Bonna Gains MD, MD 06/24/2018 9:28:49 AM This report has been signed  electronically. Number of Addenda: 0 Note Initiated On: 06/24/2018 8:26 AM Scope Withdrawal Time: 0 hours 17 minutes 3 seconds  Total Procedure Duration: 0 hours 21 minutes 13 seconds  Estimated Blood Loss: Estimated blood loss: none.      [facility]

## 2018-06-25 LAB — SURGICAL PATHOLOGY

## 2018-06-28 ENCOUNTER — Encounter: Payer: Self-pay | Admitting: Gastroenterology

## 2018-07-02 ENCOUNTER — Telehealth: Payer: Self-pay

## 2018-07-02 NOTE — Telephone Encounter (Signed)
-----   Message from Virgel Manifold, MD sent at 07/01/2018  4:28 PM EDT ----- Jackelyn Poling please let patient know, the polyp removed from his colon was benign but precancerous.  His next colonoscopy should be in 5 years. Melanosis coli was seen in his colon which can be from stool softener or laxative use Stomach biopsies did not show any H. pylori.  Mild gastritis seen.  He should avoid medications like NSAIDs.

## 2018-07-02 NOTE — Telephone Encounter (Signed)
Pt notified of colonoscopy results and to avoid NSAIDS.

## 2018-07-07 ENCOUNTER — Other Ambulatory Visit: Payer: Self-pay | Admitting: Student in an Organized Health Care Education/Training Program

## 2018-07-20 ENCOUNTER — Telehealth: Payer: Self-pay | Admitting: Gastroenterology

## 2018-07-20 NOTE — Telephone Encounter (Signed)
Pt left vm he needs refill on   rx Nepodiotine  that is made at  [facility] [place]

## 2018-07-23 NOTE — Telephone Encounter (Signed)
Called [facility] to have Nifidepine ointment 2% twice daily x 2wks. Pt aware.

## 2018-08-03 ENCOUNTER — Other Ambulatory Visit: Payer: Self-pay

## 2018-08-03 ENCOUNTER — Encounter: Payer: Self-pay | Admitting: Student in an Organized Health Care Education/Training Program

## 2018-08-03 ENCOUNTER — Ambulatory Visit
Payer: 59 | Attending: Student in an Organized Health Care Education/Training Program | Admitting: Student in an Organized Health Care Education/Training Program

## 2018-08-03 VITALS — BP 120/83 | HR 83 | Temp 98.4°F | Resp 16 | Ht 71.0 in | Wt 162.5 lb

## 2018-08-03 DIAGNOSIS — G894 Chronic pain syndrome: Secondary | ICD-10-CM | POA: Insufficient documentation

## 2018-08-03 DIAGNOSIS — M4726 Other spondylosis with radiculopathy, lumbar region: Secondary | ICD-10-CM | POA: Diagnosis not present

## 2018-08-03 DIAGNOSIS — M5116 Intervertebral disc disorders with radiculopathy, lumbar region: Secondary | ICD-10-CM | POA: Insufficient documentation

## 2018-08-03 DIAGNOSIS — M47816 Spondylosis without myelopathy or radiculopathy, lumbar region: Secondary | ICD-10-CM

## 2018-08-03 DIAGNOSIS — Z7982 Long term (current) use of aspirin: Secondary | ICD-10-CM | POA: Diagnosis not present

## 2018-08-03 DIAGNOSIS — E785 Hyperlipidemia, unspecified: Secondary | ICD-10-CM | POA: Diagnosis not present

## 2018-08-03 DIAGNOSIS — Z760 Encounter for issue of repeat prescription: Secondary | ICD-10-CM | POA: Diagnosis not present

## 2018-08-03 DIAGNOSIS — I2510 Atherosclerotic heart disease of native coronary artery without angina pectoris: Secondary | ICD-10-CM | POA: Diagnosis not present

## 2018-08-03 DIAGNOSIS — M5416 Radiculopathy, lumbar region: Secondary | ICD-10-CM | POA: Diagnosis not present

## 2018-08-03 DIAGNOSIS — Z79899 Other long term (current) drug therapy: Secondary | ICD-10-CM | POA: Diagnosis not present

## 2018-08-03 DIAGNOSIS — I10 Essential (primary) hypertension: Secondary | ICD-10-CM | POA: Diagnosis not present

## 2018-08-03 DIAGNOSIS — K5730 Diverticulosis of large intestine without perforation or abscess without bleeding: Secondary | ICD-10-CM | POA: Insufficient documentation

## 2018-08-03 DIAGNOSIS — G8929 Other chronic pain: Secondary | ICD-10-CM

## 2018-08-03 DIAGNOSIS — Z951 Presence of aortocoronary bypass graft: Secondary | ICD-10-CM | POA: Insufficient documentation

## 2018-08-03 DIAGNOSIS — Z800 Family history of malignant neoplasm of digestive organs: Secondary | ICD-10-CM | POA: Diagnosis not present

## 2018-08-03 DIAGNOSIS — K219 Gastro-esophageal reflux disease without esophagitis: Secondary | ICD-10-CM | POA: Insufficient documentation

## 2018-08-03 DIAGNOSIS — Z8249 Family history of ischemic heart disease and other diseases of the circulatory system: Secondary | ICD-10-CM | POA: Insufficient documentation

## 2018-08-03 DIAGNOSIS — M545 Low back pain: Secondary | ICD-10-CM

## 2018-08-03 DIAGNOSIS — Z880 Allergy status to penicillin: Secondary | ICD-10-CM | POA: Diagnosis not present

## 2018-08-03 DIAGNOSIS — N529 Male erectile dysfunction, unspecified: Secondary | ICD-10-CM | POA: Insufficient documentation

## 2018-08-03 DIAGNOSIS — E559 Vitamin D deficiency, unspecified: Secondary | ICD-10-CM | POA: Insufficient documentation

## 2018-08-03 MED ORDER — TRAMADOL HCL 50 MG PO TABS: 100.0000 mg | ORAL_TABLET | Freq: Three times a day (TID) | ORAL | 3 refills | Status: DC | PRN

## 2018-08-03 NOTE — Patient Instructions (Signed)
Tramadol with 3 refills has been escribed to your pharmacy.

## 2018-08-03 NOTE — Progress Notes (Signed)
Nursing Pain Medication Assessment:  Safety precautions to be maintained throughout the outpatient stay will include: orient to surroundings, keep bed in low position, maintain call bell within reach at all times, provide assistance with transfer out of bed and ambulation.  Medication Inspection Compliance: Pill count conducted under aseptic conditions, in front of the patient. Neither the pills nor the bottle was removed from the patient's sight at any time. Once count was completed pills were immediately returned to the patient in their original bottle.  Medication: Tramadol (Ultram) Pill/Patch Count: 28 of 180 pills remain Pill/Patch Appearance: Markings consistent with prescribed medication Bottle Appearance: Standard pharmacy container. Clearly labeled. Filled Date: 10 / 9 / 2019 Last Medication intake:  Today

## 2018-08-03 NOTE — Progress Notes (Signed)
Patient's Name: Shawn Crawford  MRN: [id number]  Referring Provider: Jerrol Banana.,*  DOB: 03-23-1961  PCP: Jerrol Banana., MD  DOS: 08/03/2018  Note by: Gillis Santa, MD  Service setting: Ambulatory outpatient  Specialty: Interventional Pain Management  Location: [facility] (AMB) Pain Management Facility    Patient type: Established   Primary Reason(s) for Visit: Encounter for prescription drug management. (Level of risk: moderate)  CC: Back Pain (lower)  HPI  Shawn Crawford is a 57 y.o. year old, male patient, who comes today for a medication management evaluation. He has Abnormal blood sugar; Arteriosclerosis of coronary artery; ED (erectile dysfunction) of organic origin; Acid reflux; HLD (hyperlipidemia); BP (high blood pressure); Lumbar radiculopathy; Adiposity; Avitaminosis D; Hyperglycemia; Benign essential HTN; Chronic pain syndrome; Chronic midline low back pain without sciatica; Lumbar facet arthropathy; Lumbar degenerative disc disease; Piriformis muscle pain; Special screening for malignant neoplasms, colon; Benign neoplasm of descending colon; Diverticulosis of large intestine without diverticulitis; Anal fissure; Melanosis of colon; and Internal hemorrhoids on their problem list. His primarily concern today is the Back Pain (lower)  Pain Assessment: Location: Lower Back Radiating: hips bilaterally and around to groin area Onset: More than a month ago Duration: Chronic pain Quality: Aching Severity: 2 /10 (subjective, self-reported pain score)  Note: Reported level is compatible with observation.                         When using our objective Pain Scale, levels between 6 and 10/10 are said to belong in an emergency room, as it progressively worsens from a 6/10, described as severely limiting, requiring emergency care not usually available at an outpatient pain management facility. At a 6/10 level, communication becomes difficult and requires great effort. Assistance to reach  the emergency department may be required. Facial flushing and profuse sweating along with potentially dangerous increases in heart rate and blood pressure will be evident. Effect on ADL: "without meds, pain limits daily activities" Timing: Intermittent Modifying factors: meds BP: 120/83  HR: 83  Shawn Crawford was last scheduled for an appointment on 07/07/2018 for medication management. During today's appointment we reviewed Shawn Crawford chronic pain status, as well as his outpatient medication regimen.  The patient  reports that he does not use drugs. His body mass index is 22.66 kg/m.  Further details on both, my assessment(s), as well as the proposed treatment plan, please see below.  Controlled Substance Pharmacotherapy Assessment REMS (Risk Evaluation and Mitigation Strategy)  Analgesic: Tramadol 100 mg TID prn MME/day: 30 mg/day.  Rise Patience, RN  08/03/2018  9:59 AM  Sign at close encounter Nursing Pain Medication Assessment:  Safety precautions to be maintained throughout the outpatient stay will include: orient to surroundings, keep bed in low position, maintain call bell within reach at all times, provide assistance with transfer out of bed and ambulation.  Medication Inspection Compliance: Pill count conducted under aseptic conditions, in front of the patient. Neither the pills nor the bottle was removed from the patient's sight at any time. Once count was completed pills were immediately returned to the patient in their original bottle.  Medication: Tramadol (Ultram) Pill/Patch Count: 28 of 180 pills remain Pill/Patch Appearance: Markings consistent with prescribed medication Bottle Appearance: Standard pharmacy container. Clearly labeled. Filled Date: 10 / 9 / 2019 Last Medication intake:  Today   Pharmacokinetics: Liberation and absorption (onset of action): WNL Distribution (time to peak effect): WNL Metabolism and excretion (duration of action): WNL  Pharmacodynamics: Desired effects: Analgesia: Shawn Crawford reports >50% benefit. Functional ability: Patient reports that medication allows him to accomplish basic ADLs Clinically meaningful improvement in function (CMIF): Sustained CMIF goals met Perceived effectiveness: Described as relatively effective, allowing for increase in activities of daily living (ADL) Undesirable effects: Side-effects or Adverse reactions: None reported Monitoring: [place] PMP: Online review of the past 75-monthperiod conducted. Compliant with practice rules and regulations Last UDS on record: Summary  Date Value Ref Range Status  07/07/2017 FINAL  Corrected    Comment:    ==================================================================== [facility] COMP DRUG ANALYSIS,UR ==================================================================== Test                             Result       Flag       Units Drug Present and Declared for Prescription Verification   Zolpidem                       PRESENT      EXPECTED   Zolpidem Acid                  PRESENT      EXPECTED    Zolpidem acid is an expected metabolite of zolpidem.   Metoprolol                     PRESENT      EXPECTED Drug Present not Declared for Prescription Verification   Naproxen                       PRESENT      UNEXPECTED Drug Absent but Declared for Prescription Verification   Tizanidine                     Not Detected UNEXPECTED    Tizanidine, as indicated in the declared medication list, is not    always detected even when used as directed.   Salicylate                     Not Detected UNEXPECTED    Aspirin, as indicated in the declared medication list, is not    always detected even when used as directed. ==================================================================== Test                      Result    Flag   Units      Ref Range   Creatinine              73               mg/dL       >=20 ==================================================================== Declared Medications:  The flagging and interpretation on this report are based on the  following declared medications.  Unexpected results may arise from  inaccuracies in the declared medications.  **Note: The testing scope of this panel includes these medications:  Metoprolol (Lopressor)  **Note: The testing scope of this panel does not include small to  moderate amounts of these reported medications:  Aspirin  Tizanidine (Zanaflex)  Zolpidem (Ambien)  **Note: The testing scope of this panel does not include following  reported medications:  Fexofenadine (Allegra)  Omega-3 Fatty Acids (Fish Oil)  Rosuvastatin (Crestor)  Ubiquinone (CoQ10) ==================================================================== For clinical consultation, please call ([phone] ====================================================================    UDS interpretation: Compliant  Medication Assessment Form: Reviewed. Patient indicates being compliant with therapy Treatment compliance: Compliant Risk Assessment Profile: Aberrant behavior: See prior evaluations. None observed or detected today Comorbid factors increasing risk of overdose: See prior notes. No additional risks detected today Opioid risk tool (ORT) (Total Score): 0 Personal History of Substance Abuse (SUD-Substance use disorder):  Alcohol: Negative  Illegal Drugs: Negative  Rx Drugs: Negative  ORT Risk Level calculation: Low Risk Risk of substance use disorder (SUD): Low Opioid Risk Tool - 08/03/18 0959      Family History of Substance Abuse   Alcohol  Negative    Illegal Drugs  Negative    Rx Drugs  Negative      Personal History of Substance Abuse   Alcohol  Negative    Illegal Drugs  Negative    Rx Drugs  Negative      Age   Age between 21-45 years   No      History of Preadolescent Sexual Abuse   History of Preadolescent Sexual Abuse   Negative or Male      Psychological Disease   Psychological Disease  Negative    Depression  Negative      Total Score   Opioid Risk Tool Scoring  0    Opioid Risk Interpretation  Low Risk      ORT Scoring interpretation table:  Score <3 = Low Risk for SUD  Score between 4-7 = Moderate Risk for SUD  Score >8 = High Risk for Opioid Abuse   Risk Mitigation Strategies:  Patient Counseling: Covered Patient-Prescriber Agreement (PPA): Present and active  Notification to other healthcare providers: Done  Pharmacologic Plan: No change in therapy, at this time.             Laboratory Chemistry  Inflammation Markers (CRP: Acute Phase) (ESR: Chronic Phase) Lab Results  Component Value Date   ESRSEDRATE 2 05/12/2017                         Rheumatology Markers Lab Results  Component Value Date   ANA Negative 05/12/2017   LABURIC 7.5 07/08/2017   HLAB27 Negative 05/12/2017                        Renal Function Markers Lab Results  Component Value Date   BUN 13 04/22/2018   CREATININE 1.19 04/22/2018   BCR 11 04/22/2018   GFRAA 78 04/22/2018   GFRNONAA 67 04/22/2018                             Hepatic Function Markers Lab Results  Component Value Date   AST 26 04/22/2018   ALT 30 04/22/2018   ALBUMIN 4.9 04/22/2018   ALKPHOS 61 04/22/2018                        Electrolytes Lab Results  Component Value Date   NA 142 04/22/2018   K 4.8 04/22/2018   CL 101 04/22/2018   CALCIUM 10.0 04/22/2018                        Neuropathy Markers Lab Results  Component Value Date   HGBA1C 5.8 (H) 02/12/2017                        CNS Tests No results  found for: COLORCSF, APPEARCSF, RBCCOUNTCSF, WBCCSF, POLYSCSF, LYMPHSCSF, EOSCSF, PROTEINCSF, GLUCCSF, JCVIRUS, CSFOLI, IGGCSF                      Bone Pathology Markers Lab Results  Component Value Date   TESTOFREE 4.6 (L) 11/25/2017   TESTOSTERONE 336 11/25/2017                         Coagulation Parameters Lab  Results  Component Value Date   PLT 177 04/22/2018                        Cardiovascular Markers Lab Results  Component Value Date   HGB 14.0 04/22/2018   HCT 41.7 04/22/2018                         CA Markers No results found for: CEA, CA125, LABCA2                      Note: Lab results reviewed.  Recent Diagnostic Imaging Results  CT Abdomen Pelvis W Contrast CLINICAL DATA:  57 year old male with a history of unexplained weight loss  EXAM: CT CHEST, ABDOMEN, AND PELVIS WITH CONTRAST  TECHNIQUE: Multidetector CT imaging of the chest, abdomen and pelvis was performed following the standard protocol during bolus administration of intravenous contrast.  CONTRAST:  168m OMNIPAQUE IOHEXOL 300 MG/ML  SOLN  COMPARISON:  None.  FINDINGS: CT CHEST FINDINGS  Cardiovascular:  Heart:  Surgical changes of median sternotomy and CABG. Native coronary calcifications  Aorta:  Unremarkable course, caliber, contour of the thoracic aorta. No aneurysm or dissection flap. No periaortic fluid. Calcifications of the aortic arch. Branch vessels are patent. Descending thoracic aorta with mild atherosclerotic changes.  Pulmonary arteries:  Study is not tailored for the most sensitive evaluation of the pulmonary arteries, however, no central filling defects identified.  Mediastinum/Nodes: Small lymph nodes of the mediastinum. Calcified lymph nodes in the subcarinal and left hilar nodal stations. Unremarkable appearance of the thoracic esophagus.  Unremarkable appearance of the thoracic inlet.  Lungs/Pleura: Central airways are clear. No pleural effusion. No confluent airspace disease.  No pneumothorax.  Musculoskeletal: No acute displaced fracture. Degenerative changes of the spine.  Review of the MIP images confirms the above findings.  CT ABDOMEN PELVIS FINDINGS  Hepatobiliary: Unremarkable appearance of liver. Unremarkable gallbladder  Pancreas: Unremarkable  pancreas  Spleen: Punctate calcifications throughout the spleen, compatible with prior granulomatous disease.  Adrenals/Urinary Tract: Unremarkable appearance of the adrenal glands. No evidence of hydronephrosis of the right or left kidney. No nephrolithiasis. Unremarkable course of the bilateral ureters. Unremarkable appearance of the urinary bladder.  Stomach/Bowel: Unremarkable stomach. Unremarkable small bowel. Appendectomy. Moderate stool burden. Colonic diverticular disease without evidence of acute inflammation.  Vascular/Lymphatic: Atherosclerotic changes of the abdominal aorta. Mesenteric arteries and bilateral renal arteries are patent. Bilateral iliac arteries and proximal femoral arteries patent.  Reproductive: Diameter of the prostate measures 4.4 cm  Other: None  Musculoskeletal: No acute displaced fracture.  Degenerative changes.  IMPRESSION: No acute CT finding.  No finding to account for weight loss.  Surgical changes of median sternotomy and CABG.  Aortic Atherosclerosis (ICD10-I70.0).  Diverticular disease without evidence of acute diverticulitis.  Electronically Signed   By: JCorrie MckusickD.O.   On: 05/06/2018 09:57 CT Chest W Contrast CLINICAL DATA:  57year old male with a history of unexplained weight loss  EXAM: CT CHEST, ABDOMEN, AND PELVIS WITH CONTRAST  TECHNIQUE: Multidetector CT imaging of the chest, abdomen and pelvis was performed following the standard protocol during bolus administration of intravenous contrast.  CONTRAST:  187m OMNIPAQUE IOHEXOL 300 MG/ML  SOLN  COMPARISON:  None.  FINDINGS: CT CHEST FINDINGS  Cardiovascular:  Heart:  Surgical changes of median sternotomy and CABG. Native coronary calcifications  Aorta:  Unremarkable course, caliber, contour of the thoracic aorta. No aneurysm or dissection flap. No periaortic fluid. Calcifications of the aortic arch. Branch vessels are patent. Descending  thoracic aorta with mild atherosclerotic changes.  Pulmonary arteries:  Study is not tailored for the most sensitive evaluation of the pulmonary arteries, however, no central filling defects identified.  Mediastinum/Nodes: Small lymph nodes of the mediastinum. Calcified lymph nodes in the subcarinal and left hilar nodal stations. Unremarkable appearance of the thoracic esophagus.  Unremarkable appearance of the thoracic inlet.  Lungs/Pleura: Central airways are clear. No pleural effusion. No confluent airspace disease.  No pneumothorax.  Musculoskeletal: No acute displaced fracture. Degenerative changes of the spine.  Review of the MIP images confirms the above findings.  CT ABDOMEN PELVIS FINDINGS  Hepatobiliary: Unremarkable appearance of liver. Unremarkable gallbladder  Pancreas: Unremarkable pancreas  Spleen: Punctate calcifications throughout the spleen, compatible with prior granulomatous disease.  Adrenals/Urinary Tract: Unremarkable appearance of the adrenal glands. No evidence of hydronephrosis of the right or left kidney. No nephrolithiasis. Unremarkable course of the bilateral ureters. Unremarkable appearance of the urinary bladder.  Stomach/Bowel: Unremarkable stomach. Unremarkable small bowel. Appendectomy. Moderate stool burden. Colonic diverticular disease without evidence of acute inflammation.  Vascular/Lymphatic: Atherosclerotic changes of the abdominal aorta. Mesenteric arteries and bilateral renal arteries are patent. Bilateral iliac arteries and proximal femoral arteries patent.  Reproductive: Diameter of the prostate measures 4.4 cm  Other: None  Musculoskeletal: No acute displaced fracture.  Degenerative changes.  IMPRESSION: No acute CT finding.  No finding to account for weight loss.  Surgical changes of median sternotomy and CABG.  Aortic Atherosclerosis (ICD10-I70.0).  Diverticular disease without evidence of acute  diverticulitis.  Electronically Signed   By: JCorrie MckusickD.O.   On: 05/06/2018 09:57  Complexity Note: Imaging results reviewed. Results shared with Mr. DMceachin using Layman's terms.                         Meds   Current Outpatient Medications:  .  amLODipine (NORVASC) 5 MG tablet, Take 1 tablet (5 mg total) by mouth daily., Disp: 90 tablet, Rfl: 3 .  aspirin EC 81 MG tablet, Take 81 mg by mouth daily., Disp: , Rfl:  .  COENZYME Q-10 PO, Take 1 capsule by mouth daily. , Disp: , Rfl:  .  metoprolol tartrate (LOPRESSOR) 25 MG tablet, Take 0.5 tablets (12.5 mg total) by mouth 2 (two) times daily., Disp: 60 tablet, Rfl: 1 .  MULTIPLE VITAMINS PO, Take 1 tablet by mouth daily. , Disp: , Rfl:  .  Omega-3 Fatty Acids (FISH OIL) 1200 MG CAPS, Take 1 capsule by mouth daily. , Disp: , Rfl:  .  rosuvastatin (CRESTOR) 40 MG tablet, TAKE 1 TABLET BY MOUTH  DAILY, Disp: 90 tablet, Rfl: 3 .  [START ON 08/06/2018] traMADol (ULTRAM) 50 MG tablet, Take 2 tablets (100 mg total) by mouth 3 (three) times daily as needed for severe pain. For chronic pain To last for 30 days from fill date., Disp: 180 tablet, Rfl: 3 .  zolpidem (AMBIEN) 10 MG tablet, TAKE  1 TABLET BY MOUTH AT  BEDTIME AS NEEDED FOR SLEEP, Disp: 90 tablet, Rfl: 1 .  fexofenadine (ALLEGRA) 180 MG tablet, Take 180 mg by mouth daily as needed. , Disp: , Rfl:  .  hydrocortisone (ANUSOL-HC) 25 MG suppository, Place 1 suppository (25 mg total) rectally at bedtime as needed for hemorrhoids. (Patient not taking: Reported on 08/03/2018), Disp: 12 suppository, Rfl: 3  ROS  Constitutional: Denies any fever or chills Gastrointestinal: No reported hemesis, hematochezia, vomiting, or acute GI distress Musculoskeletal: Denies any acute onset joint swelling, redness, loss of ROM, or weakness Neurological: No reported episodes of acute onset apraxia, aphasia, dysarthria, agnosia, amnesia, paralysis, loss of coordination, or loss of consciousness  Allergies   Mr. Corsetti is allergic to penicillins.  [place]  Drug: Mr. Lia  reports that he does not use drugs. Alcohol:  reports that he drinks alcohol. Tobacco:  reports that he has never smoked. He has never used smokeless tobacco. Medical:  has a past medical history of cardiac catheterization, Hyperlipidemia, and Hypertension. Surgical: Mr. Aguayo  has a past surgical history that includes Coronary artery bypass graft; Hernia repair; Appendectomy; Cardiac catheterization; Colonoscopy with propofol (N/A, 06/24/2018); and Esophagogastroduodenoscopy (egd) with propofol (N/A, 06/24/2018). Family: family history includes Colon cancer in his sister; Diabetes in his father; Hyperlipidemia in his mother; Hypertension in his father; Thyroid cancer in his sister.  Constitutional Exam  General appearance: Well nourished, well developed, and well hydrated. In no apparent acute distress Vitals:   08/03/18 0952  BP: 120/83  Pulse: 83  Resp: 16  Temp: 98.4 F (36.9 C)  TempSrc: Oral  SpO2: 100%  Weight: 162 lb 8 oz (73.7 kg)  Height: '5\' 11"'$  (1.803 m)   BMI Assessment: Estimated body mass index is 22.66 kg/m as calculated from the following:   Height as of this encounter: '5\' 11"'$  (1.803 m).   Weight as of this encounter: 162 lb 8 oz (73.7 kg).  BMI interpretation table: BMI level Category Range association with higher incidence of chronic pain  <18 kg/m2 Underweight   18.5-24.9 kg/m2 Ideal body weight   25-29.9 kg/m2 Overweight Increased incidence by 20%  30-34.9 kg/m2 Obese (Class I) Increased incidence by 68%  35-39.9 kg/m2 Severe obesity (Class II) Increased incidence by 136%  >40 kg/m2 Extreme obesity (Class III) Increased incidence by 254%   Patient's current BMI Ideal Body weight  Body mass index is 22.66 kg/m. Ideal body weight: 75.3 kg (166 lb 0.1 oz)   BMI Readings from Last 4 Encounters:  08/03/18 22.66 kg/m  06/24/18 23.15 kg/m  06/10/18 23.65 kg/m  04/22/18 23.29 kg/m   Wt Readings  from Last 4 Encounters:  08/03/18 162 lb 8 oz (73.7 kg)  06/24/18 166 lb (75.3 kg)  06/10/18 169 lb 9.6 oz (76.9 kg)  04/22/18 167 lb (75.8 kg)  Psych/Mental status: Alert, oriented x 3 (person, place, & time)       Eyes: PERLA Respiratory: No evidence of acute respiratory distress  Cervical Spine Area Exam  Skin & Axial Inspection: No masses, redness, edema, swelling, or associated skin lesions Alignment: Symmetrical Functional ROM: Unrestricted ROM      Stability: No instability detected Muscle Tone/Strength: Functionally intact. No obvious neuro-muscular anomalies detected. Sensory (Neurological): Unimpaired Palpation: No palpable anomalies              Upper Extremity (UE) Exam    Side: Right upper extremity  Side: Left upper extremity  Skin & Extremity Inspection: Skin color, temperature, and hair  growth are WNL. No peripheral edema or cyanosis. No masses, redness, swelling, asymmetry, or associated skin lesions. No contractures.  Skin & Extremity Inspection: Skin color, temperature, and hair growth are WNL. No peripheral edema or cyanosis. No masses, redness, swelling, asymmetry, or associated skin lesions. No contractures.  Functional ROM: Unrestricted ROM          Functional ROM: Unrestricted ROM          Muscle Tone/Strength: Functionally intact. No obvious neuro-muscular anomalies detected.  Muscle Tone/Strength: Functionally intact. No obvious neuro-muscular anomalies detected.  Sensory (Neurological): Unimpaired          Sensory (Neurological): Unimpaired          Palpation: No palpable anomalies              Palpation: No palpable anomalies              Provocative Test(s):  Phalen's test: deferred Tinel's test: deferred Apley's scratch test (touch opposite shoulder):  Action 1 (Across chest): deferred Action 2 (Overhead): deferred Action 3 (LB reach): deferred   Provocative Test(s):  Phalen's test: deferred Tinel's test: deferred Apley's scratch test (touch opposite  shoulder):  Action 1 (Across chest): deferred Action 2 (Overhead): deferred Action 3 (LB reach): deferred    Thoracic Spine Area Exam  Skin & Axial Inspection: No masses, redness, or swelling Alignment: Symmetrical Functional ROM: Unrestricted ROM Stability: No instability detected Muscle Tone/Strength: Functionally intact. No obvious neuro-muscular anomalies detected. Sensory (Neurological): Unimpaired Muscle strength & Tone: No palpable anomalies  Lumbar Spine Area Exam  Skin & Axial Inspection: No masses, redness, or swelling Alignment: Symmetrical Functional ROM: Unrestricted ROM       Stability: No instability detected Muscle Tone/Strength: Functionally intact. No obvious neuro-muscular anomalies detected. Sensory (Neurological): Unimpaired Palpation: No palpable anomalies       Provocative Tests: Hyperextension/rotation test: deferred today       Lumbar quadrant test (Kemp's test): deferred today       Lateral bending test: deferred today       Patrick's Maneuver: deferred today                   FABER test: deferred today                   S-I anterior distraction/compression test: deferred today         S-I lateral compression test: deferred today         S-I Thigh-thrust test: deferred today         S-I Gaenslen's test: deferred today          Gait & Posture Assessment  Ambulation: Unassisted Gait: Relatively normal for age and body habitus Posture: WNL   Lower Extremity Exam    Side: Right lower extremity  Side: Left lower extremity  Stability: No instability observed          Stability: No instability observed          Skin & Extremity Inspection: Skin color, temperature, and hair growth are WNL. No peripheral edema or cyanosis. No masses, redness, swelling, asymmetry, or associated skin lesions. No contractures.  Skin & Extremity Inspection: Skin color, temperature, and hair growth are WNL. No peripheral edema or cyanosis. No masses, redness, swelling, asymmetry,  or associated skin lesions. No contractures.  Functional ROM: Unrestricted ROM                  Functional ROM: Unrestricted ROM  Muscle Tone/Strength: Functionally intact. No obvious neuro-muscular anomalies detected.  Muscle Tone/Strength: Functionally intact. No obvious neuro-muscular anomalies detected.  Sensory (Neurological): Unimpaired  Sensory (Neurological): Unimpaired  Palpation: No palpable anomalies  Palpation: No palpable anomalies   Assessment  Primary Diagnosis & Pertinent Problem List: The primary encounter diagnosis was Lumbar radiculopathy. Diagnoses of Chronic midline low back pain without sciatica, Lumbar facet arthropathy, and Chronic pain syndrome were also pertinent to this visit.  Status Diagnosis  Controlled Controlled Controlled 1. Lumbar radiculopathy   2. Chronic midline low back pain without sciatica   3. Lumbar facet arthropathy   4. Chronic pain syndrome      General Recommendations: The pain condition that the patient suffers  from is best treated with a multidisciplinary approach that involves an increase in physical activity to prevent de-conditioning and worsening of the pain cycle, as well as psychological counseling (formal and/or informal) to address the co-morbid psychological affects of pain. Treatment will often involve judicious use of pain medications and interventional procedures to decrease the pain, allowing the patient to participate in the physical activity that will ultimately produce long-lasting pain reductions. The goal of the multidisciplinary approach is to return the patient to a higher level of overall function and to restore their ability to perform activities of daily living.  57 year old male with axial low back pain that radiates into bilateral buttocks and posterior thighs, onset and duration approximately 3 years. Patient's lumbar MRI is significant for lumbar spondylosis, facet arthropathy and L3-L4 disc bulge.  Patient has had 2 diagnostic lumbar medial branch nerve blocks which were not effective. He has undergone physical therapy and did not obtain any significant benefit. Has been obtaining benefit with current dose of Tramadol 100 mg TID prn. Here for med refill. PMP checked and appropriate. Will repeat annual UDS for medication compliance & monitoring  Plan of Care  Pharmacotherapy (Medications Ordered): Meds ordered this encounter  Medications  . traMADol (ULTRAM) 50 MG tablet    Sig: Take 2 tablets (100 mg total) by mouth 3 (three) times daily as needed for severe pain. For chronic pain To last for 30 days from fill date.    Dispense:  180 tablet    Refill:  3    Do not place this medication, or any other prescription from our practice, on "Automatic Refill". Patient may have prescription filled one day early if pharmacy is closed on scheduled refill date.   Provider-requested follow-up: Return in about 4 months (around 12/02/2018) for Medication Management.  Future Appointments  Date Time Provider [place]  08/04/2018  2:40 PM Jerrol Banana., MD [facility] None    Primary Care Physician: Jerrol Banana., MD Location: [facility] Note by: Gillis Santa, M.D Date: 08/03/2018; Time: 10:18 AM  There are no Patient Instructions on file for this visit.

## 2018-08-04 ENCOUNTER — Ambulatory Visit (INDEPENDENT_AMBULATORY_CARE_PROVIDER_SITE_OTHER): Payer: 59 | Admitting: Family Medicine

## 2018-08-04 VITALS — BP 122/82 | HR 85 | Temp 98.2°F | Resp 16 | Wt 169.0 lb

## 2018-08-04 DIAGNOSIS — R7989 Other specified abnormal findings of blood chemistry: Secondary | ICD-10-CM

## 2018-08-04 DIAGNOSIS — Z23 Encounter for immunization: Secondary | ICD-10-CM | POA: Diagnosis not present

## 2018-08-04 DIAGNOSIS — R51 Headache: Secondary | ICD-10-CM

## 2018-08-04 DIAGNOSIS — I2510 Atherosclerotic heart disease of native coronary artery without angina pectoris: Secondary | ICD-10-CM

## 2018-08-04 DIAGNOSIS — R634 Abnormal weight loss: Secondary | ICD-10-CM | POA: Diagnosis not present

## 2018-08-04 DIAGNOSIS — M791 Myalgia, unspecified site: Secondary | ICD-10-CM

## 2018-08-04 DIAGNOSIS — R519 Headache, unspecified: Secondary | ICD-10-CM

## 2018-08-04 NOTE — Progress Notes (Signed)
Shawn Crawford  MRN: [id number] DOB: May 27, 1961  Subjective:  HPI   The patient is a 57 year old male who presents for follow up of weight loss after being seen by GI/  He has had endoscopy, colonoscopy, abd/pelvic CT all of which were negative.  The GI doctor put him on Nifedipine ointment for a fissure.  The patient has complaints of neck pain and stiffness, pain in the forehead, weight loss and light headedness.   Patient has also lost his taste for coffee, beer and wine.   The patient was recently with his brother in law and was discussing his symptoms.  He suggested that he may want to have tests done to check his pituitary.  It is of note that the patient has history of low testosterone. Pt makes an unusual complaint--coffee,beer and wine no longer tast good to him. No problems with smells.  Patient Active Problem List   Diagnosis Date Noted  . Special screening for malignant neoplasms, colon   . Benign neoplasm of descending colon   . Diverticulosis of large intestine without diverticulitis   . Anal fissure   . Melanosis of colon   . Internal hemorrhoids   . Lumbar degenerative disc disease 04/06/2018  . Piriformis muscle pain 04/06/2018  . Chronic pain syndrome 10/22/2017  . Chronic midline low back pain without sciatica 10/22/2017  . Lumbar facet arthropathy 10/22/2017  . Hyperglycemia 05/30/2015  . Abnormal blood sugar 05/10/2015  . Arteriosclerosis of coronary artery 05/10/2015  . ED (erectile dysfunction) of organic origin 05/10/2015  . Acid reflux 05/10/2015  . HLD (hyperlipidemia) 05/10/2015  . BP (high blood pressure) 05/10/2015  . Lumbar radiculopathy 05/10/2015  . Adiposity 05/10/2015  . Avitaminosis D 05/10/2015  . Benign essential HTN 05/07/2015    Past Medical History:  Diagnosis Date  . Hx of cardiac catheterization   . Hyperlipidemia   . Hypertension     Social History   Socioeconomic History  . Marital status: Single    Spouse name: Not on  file  . Number of children: Not on file  . Years of education: Not on file  . Highest education level: Not on file  Occupational History  . Not on file  Social Needs  . Financial resource strain: Not on file  . Food insecurity:    Worry: Not on file    Inability: Not on file  . Transportation needs:    Medical: Not on file    Non-medical: Not on file  Tobacco Use  . Smoking status: Never Smoker  . Smokeless tobacco: Never Used  Substance and Sexual Activity  . Alcohol use: Yes    Alcohol/week: 0.0 standard drinks    Comment: glass of wine occasionally  . Drug use: No  . Sexual activity: Not on file  Lifestyle  . Physical activity:    Days per week: Not on file    Minutes per session: Not on file  . Stress: Not on file  Relationships  . Social connections:    Talks on phone: Not on file    Gets together: Not on file    Attends religious service: Not on file    Active member of club or organization: Not on file    Attends meetings of clubs or organizations: Not on file    Relationship status: Not on file  . Intimate partner violence:    Fear of current or ex partner: Not on file    Emotionally abused: Not on  file    Physically abused: Not on file    Forced sexual activity: Not on file  Other Topics Concern  . Not on file  Social History Narrative  . Not on file    Outpatient Encounter Medications as of 08/04/2018  Medication Sig Note  . amLODipine (NORVASC) 5 MG tablet Take 1 tablet (5 mg total) by mouth daily.   Marland Kitchen aspirin EC 81 MG tablet Take 81 mg by mouth daily.   Marland Kitchen COENZYME Q-10 PO Take 1 capsule by mouth daily.  05/10/2015: Received from: [facility]  . fexofenadine (ALLEGRA) 180 MG tablet Take 180 mg by mouth daily as needed.    . metoprolol tartrate (LOPRESSOR) 25 MG tablet Take 0.5 tablets (12.5 mg total) by mouth 2 (two) times daily.   . MULTIPLE VITAMINS PO Take 1 tablet by mouth daily.  05/10/2015: Received from: [facility]  . rosuvastatin (CRESTOR) 40 MG tablet TAKE 1 TABLET BY MOUTH  DAILY   . [START ON 08/06/2018] traMADol (ULTRAM) 50 MG tablet Take 2 tablets (100 mg total) by mouth 3 (three) times daily as needed for severe pain. For chronic pain To last for 30 days from fill date.   . zolpidem (AMBIEN) 10 MG tablet TAKE 1 TABLET BY MOUTH AT  BEDTIME AS NEEDED FOR SLEEP   . hydrocortisone (ANUSOL-HC) 25 MG suppository Place 1 suppository (25 mg total) rectally at bedtime as needed for hemorrhoids. (Patient not taking: Reported on 08/03/2018)   . Omega-3 Fatty Acids (FISH OIL) 1200 MG CAPS Take 1 capsule by mouth daily.  05/10/2015: Received from: [employer] [facility]   No facility-administered encounter medications on file as of 08/04/2018.     Allergies  Allergen Reactions  . Penicillins Itching    Review of Systems  Constitutional: Positive for malaise/fatigue.  HENT:       New taste issues.  Eyes: Negative.   Respiratory: Negative for cough, shortness of breath and wheezing.   Cardiovascular: Positive for leg swelling. Negative for chest pain, palpitations, orthopnea and claudication.  Gastrointestinal: Negative.   Musculoskeletal: Positive for back pain (chronic and being treated at the pain clinic.) and neck pain (stiffness at night).  Neurological: Positive for dizziness (lightheadedness) and headaches (sharp pain in frontal area).  Psychiatric/Behavioral: Negative.     Objective:  BP 122/82 (BP Location: Right Arm, Patient Position: Sitting, Cuff Size: Normal)   Pulse 85   Temp 98.2 F (36.8 C) (Oral)   Resp 16   Wt 169 lb (76.7 kg)   SpO2 98%   BMI 23.57 kg/m   Physical Exam  Constitutional: He is oriented to person, place, and time and well-developed, well-nourished, and in no distress.  HENT:  Head: Normocephalic and atraumatic.  Right Ear: External ear normal.  Left Ear: External ear normal.  Nose: Nose normal.  Eyes: Conjunctivae are normal.  Neck: No  thyromegaly present.  Cardiovascular: Normal rate, regular rhythm and normal heart sounds.  Pulmonary/Chest: Effort normal and breath sounds normal.  Abdominal: Soft.  Musculoskeletal: He exhibits no edema.  Neurological: He is alert and oriented to person, place, and time. No cranial nerve deficit. Gait normal. GCS score is 15.  Skin: Skin is warm and dry.  Psychiatric: Mood, memory, affect and judgment normal.    Assessment and Plan :   1. Weight loss  - Comprehensive metabolic panel  2. Need for influenza vaccination  - Flu Vaccine QUAD 6+ mos PF IM (Fluarix Quad PF)  3. Nonintractable  episodic headache, unspecified headache type  - Sedimentation rate - ANA - CK  4. Myalgia  - Sedimentation rate - ANA - CK  5. Low testosterone  - Prolactin - Cortisol - Testosterone  6. Arteriosclerosis of coronary artery    HPI, Exam and A&P Transcribed under the direction and in the presence of Miguel Aschoff, Brooke Bonito., MD. Electronically Signed: Althea Charon, RMA I have done the exam and reviewed the chart and it is accurate to the best of my knowledge. Development worker, community has been used and  any errors in dictation or transcription are unintentional. Miguel Aschoff M.D. [facility]

## 2018-08-06 DIAGNOSIS — R51 Headache: Secondary | ICD-10-CM | POA: Diagnosis not present

## 2018-08-06 DIAGNOSIS — R7989 Other specified abnormal findings of blood chemistry: Secondary | ICD-10-CM | POA: Diagnosis not present

## 2018-08-06 DIAGNOSIS — M791 Myalgia, unspecified site: Secondary | ICD-10-CM | POA: Diagnosis not present

## 2018-08-08 LAB — TOXASSURE SELECT 13 (MW), URINE

## 2018-08-08 LAB — COMPREHENSIVE METABOLIC PANEL
ALBUMIN: 5.2 g/dL (ref 3.5–5.5)
ALK PHOS: 69 IU/L (ref 39–117)
ALT: 40 IU/L (ref 0–44)
AST: 33 IU/L (ref 0–40)
Albumin/Globulin Ratio: 2.6 — ABNORMAL HIGH (ref 1.2–2.2)
BUN / CREAT RATIO: 11 (ref 9–20)
BUN: 12 mg/dL (ref 6–24)
Bilirubin Total: 0.3 mg/dL (ref 0.0–1.2)
CO2: 26 mmol/L (ref 20–29)
CREATININE: 1.09 mg/dL (ref 0.76–1.27)
Calcium: 9.8 mg/dL (ref 8.7–10.2)
Chloride: 98 mmol/L (ref 96–106)
GFR calc Af Amer: 86 mL/min/{1.73_m2} (ref >59)
GFR calc non Af Amer: 74 mL/min/{1.73_m2} (ref >59)
GLOBULIN, TOTAL: 2 g/dL (ref 1.5–4.5)
Glucose: 117 mg/dL — ABNORMAL HIGH (ref 65–99)
Potassium: 4 mmol/L (ref 3.5–5.2)
SODIUM: 141 mmol/L (ref 134–144)
Total Protein: 7.2 g/dL (ref 6.0–8.5)

## 2018-08-08 LAB — CK: Total CK: 75 U/L (ref 24–204)

## 2018-08-08 LAB — SEDIMENTATION RATE: SED RATE: 2 mm/h (ref 0–30)

## 2018-08-08 LAB — CORTISOL: Cortisol: 15.8 ug/dL

## 2018-08-08 LAB — TESTOSTERONE: Testosterone: 226 ng/dL — ABNORMAL LOW (ref 264–916)

## 2018-08-08 LAB — PROLACTIN: Prolactin: 11.6 ng/mL (ref 4.0–15.2)

## 2018-08-08 LAB — ANA: Anti Nuclear Antibody(ANA): NEGATIVE

## 2018-08-09 DIAGNOSIS — I10 Essential (primary) hypertension: Secondary | ICD-10-CM | POA: Diagnosis not present

## 2018-08-09 DIAGNOSIS — I700 Atherosclerosis of aorta: Secondary | ICD-10-CM | POA: Diagnosis not present

## 2018-08-09 DIAGNOSIS — I2510 Atherosclerotic heart disease of native coronary artery without angina pectoris: Secondary | ICD-10-CM | POA: Diagnosis not present

## 2018-08-24 ENCOUNTER — Ambulatory Visit (INDEPENDENT_AMBULATORY_CARE_PROVIDER_SITE_OTHER): Payer: 59 | Admitting: Family Medicine

## 2018-08-24 ENCOUNTER — Encounter: Payer: Self-pay | Admitting: Family Medicine

## 2018-08-24 VITALS — BP 118/62 | HR 70 | Temp 98.4°F | Resp 16 | Ht 71.0 in | Wt 171.0 lb

## 2018-08-24 DIAGNOSIS — R634 Abnormal weight loss: Secondary | ICD-10-CM

## 2018-08-24 DIAGNOSIS — M5416 Radiculopathy, lumbar region: Secondary | ICD-10-CM

## 2018-08-24 DIAGNOSIS — R7989 Other specified abnormal findings of blood chemistry: Secondary | ICD-10-CM

## 2018-08-24 DIAGNOSIS — I2510 Atherosclerotic heart disease of native coronary artery without angina pectoris: Secondary | ICD-10-CM | POA: Diagnosis not present

## 2018-08-24 MED ORDER — TESTOSTERONE 20.25 MG/1.25GM (1.62%) TD GEL: 2.0000 | Freq: Every day | TRANSDERMAL | 5 refills | Status: DC

## 2018-08-24 NOTE — Progress Notes (Signed)
Patient: Shawn Crawford Male    DOB: Aug 15, 1961   57 y.o.   MRN: [id number] Visit Date: 08/24/2018  Today's Provider: [name], MD   Chief Complaint  Patient presents with  . Follow-up   Subjective:    HPI  Patient comes in today for a follow up. He was last seen in the office 3 weeks ago. Based off of labs, he is here to discuss possible treatment for testosterone.    Allergies  Allergen Reactions  . Penicillins Itching     Current Outpatient Medications:  .  amLODipine (NORVASC) 5 MG tablet, Take 1 tablet (5 mg total) by mouth daily., Disp: 90 tablet, Rfl: 3 .  aspirin EC 81 MG tablet, Take 81 mg by mouth daily., Disp: , Rfl:  .  metoprolol tartrate (LOPRESSOR) 25 MG tablet, Take 0.5 tablets (12.5 mg total) by mouth 2 (two) times daily., Disp: 60 tablet, Rfl: 1 .  MULTIPLE VITAMINS PO, Take 1 tablet by mouth daily. , Disp: , Rfl:  .  rosuvastatin (CRESTOR) 40 MG tablet, TAKE 1 TABLET BY MOUTH  DAILY, Disp: 90 tablet, Rfl: 3 .  traMADol (ULTRAM) 50 MG tablet, Take 2 tablets (100 mg total) by mouth 3 (three) times daily as needed for severe pain. For chronic pain To last for 30 days from fill date., Disp: 180 tablet, Rfl: 3 .  zolpidem (AMBIEN) 10 MG tablet, TAKE 1 TABLET BY MOUTH AT  BEDTIME AS NEEDED FOR SLEEP, Disp: 90 tablet, Rfl: 1 .  COENZYME Q-10 PO, Take 1 capsule by mouth daily. , Disp: , Rfl:  .  fexofenadine (ALLEGRA) 180 MG tablet, Take 180 mg by mouth daily as needed. , Disp: , Rfl:  .  hydrocortisone (ANUSOL-HC) 25 MG suppository, Place 1 suppository (25 mg total) rectally at bedtime as needed for hemorrhoids. (Patient not taking: Reported on 08/03/2018), Disp: 12 suppository, Rfl: 3 .  Omega-3 Fatty Acids (FISH OIL) 1200 MG CAPS, Take 1 capsule by mouth daily. , Disp: , Rfl:   Review of Systems  Constitutional: Positive for fatigue. Negative for activity change.  Eyes: Negative.   Respiratory: Negative for cough and shortness of breath.     Cardiovascular: Negative for chest pain, palpitations and leg swelling.  Gastrointestinal: Negative.   Endocrine: Negative.   Genitourinary:       Decreased libido and ED.  Musculoskeletal: Negative for arthralgias and myalgias.  Allergic/Immunologic: Negative.   Neurological: Negative for dizziness, light-headedness and headaches.  Psychiatric/Behavioral: Negative.  Negative for agitation, decreased concentration, self-injury and sleep disturbance. The patient is not nervous/anxious.     Social History   Tobacco Use  . Smoking status: Never Smoker  . Smokeless tobacco: Never Used  Substance Use Topics  . Alcohol use: Yes    Alcohol/week: 0.0 standard drinks    Comment: glass of wine occasionally   Objective:   BP 118/62 (BP Location: Right Arm, Patient Position: Sitting, Cuff Size: Normal)   Pulse 70   Temp 98.4 F (36.9 C)   Resp 16   Ht 5\' 11"  (1.803 m)   Wt 171 lb (77.6 kg)   SpO2 99%   BMI 23.85 kg/m  Vitals:   08/24/18 1631  BP: 118/62  Pulse: 70  Resp: 16  Temp: 98.4 F (36.9 C)  SpO2: 99%  Weight: 171 lb (77.6 kg)  Height: 5\' 11"  (1.803 m)     Physical Exam  Constitutional: He is oriented to person, place, and  time. He appears well-developed and well-nourished.  HENT:  Head: Normocephalic and atraumatic.  Right Ear: External ear normal.  Left Ear: External ear normal.  Nose: Nose normal.  Eyes: Conjunctivae are normal. No scleral icterus.  Neck: No thyromegaly present.  Cardiovascular: Normal rate, regular rhythm and normal heart sounds.  Pulmonary/Chest: Effort normal and breath sounds normal.  Abdominal: Soft.  Musculoskeletal: He exhibits no edema.  Neurological: He is alert and oriented to person, place, and time.  Skin: Skin is warm and dry.  Psychiatric: He has a normal mood and affect. His behavior is normal. Judgment and thought content normal.        Assessment & Plan:     1. Low testosterone After discussion will RTC 3 months. -  Testosterone (ANDROGEL) 20.25 MG/1.25GM (1.62%) GEL; Place 2 Pump onto the skin daily.  Dispense: 1.25 g; Refill:   2. Arteriosclerosis of coronary artery   3. Lumbar radiculopathy   4. Weight loss, unintentional Pt feels fine--gained weight--work up negative.       I have done the exam and reviewed the above chart and it is accurate to the best of my knowledge. Development worker, community has been used in this note in any air is in the dictation or transcription are unintentional.  [name], MD  [place]

## 2018-08-30 ENCOUNTER — Other Ambulatory Visit: Payer: Self-pay

## 2018-08-30 DIAGNOSIS — R7989 Other specified abnormal findings of blood chemistry: Secondary | ICD-10-CM

## 2018-08-30 NOTE — Progress Notes (Unsigned)
Testosterone gel 1.62% was not covered by insurance. They are requesting Testim to be sent in. Per Dr. Rosanna Randy, ok to change medication.

## 2018-08-31 ENCOUNTER — Other Ambulatory Visit: Payer: Self-pay

## 2018-08-31 DIAGNOSIS — R7989 Other specified abnormal findings of blood chemistry: Secondary | ICD-10-CM

## 2018-08-31 MED ORDER — TESTOSTERONE 50 MG/5GM (1%) TD GEL: 5.0000 g | Freq: Every day | TRANSDERMAL | 5 refills | Status: DC

## 2018-08-31 NOTE — Telephone Encounter (Signed)
Dr. Rosanna Randy can you please send into the pharmacy? It would not let me send.   We had to change medications due to patient's insurance. Thanks!

## 2018-09-02 ENCOUNTER — Telehealth: Payer: Self-pay

## 2018-09-02 NOTE — Telephone Encounter (Signed)
Please review for Dr. Gilbert  Thanks,   -Derita Michelsen  

## 2018-09-02 NOTE — Telephone Encounter (Signed)
Patient called office today requesting that we switch his Ambien prescription back to Ambien ER.I informed patient that Dr. Rosanna Randy is out of office and would not address till he returned, patient asked that it be reviewed by doctor. KW

## 2018-09-03 ENCOUNTER — Other Ambulatory Visit: Payer: Self-pay | Admitting: Family Medicine

## 2018-09-03 MED ORDER — ZOLPIDEM TARTRATE ER 12.5 MG PO TBCR: 12.5000 mg | EXTENDED_RELEASE_TABLET | Freq: Every evening | ORAL | 0 refills | Status: DC | PRN

## 2018-09-03 NOTE — Telephone Encounter (Signed)
Advised patient as below. Patient uses [facility].

## 2018-09-03 NOTE — Telephone Encounter (Signed)
Originally changed the Ambien-CR to Ambien generic due to cost. If this is no longer an issue, can get a short supply until discussed with Dr. Rosanna Randy. Remember sleeping pills can decrease libido and alter erectile function. Also, testosterone can cause an increase in an enzyme that increases the metabolism rate of Ambien. Can get 30 day supply of the Ambien-CR to try until recheck with Dr. Rosanna Randy. Which local pharmacy?

## 2018-09-03 NOTE — Telephone Encounter (Signed)
Be sure he does not take any of the regular Ambien with this extended release tablet.

## 2018-09-05 ENCOUNTER — Encounter: Payer: Self-pay | Admitting: Family Medicine

## 2018-09-06 ENCOUNTER — Telehealth: Payer: Self-pay

## 2018-09-06 NOTE — Telephone Encounter (Signed)
Please indicate dosage instructions for my Testosterone (Androgel) medication. Is it a 40.5 mg dose per day? (2 pumps)   I want to make sure I am following proper dosage. Thanks

## 2018-09-06 NOTE — Telephone Encounter (Signed)
Patient sent over my Chart message as stated below. Please review. Thanks!

## 2018-09-08 NOTE — Telephone Encounter (Signed)
Did they cover AndroGel?  I thought they would only cover testim.  Dose of AndroGel is 2 pumps daily .Dose of  Testim I think is one small tube daily

## 2018-09-08 NOTE — Telephone Encounter (Signed)
Patient paid for Androgel out of pocket. He reports that it was a "reasonable" price. Advised to take 2 pumps daily.

## 2018-09-14 ENCOUNTER — Ambulatory Visit (INDEPENDENT_AMBULATORY_CARE_PROVIDER_SITE_OTHER): Payer: 59 | Admitting: Gastroenterology

## 2018-09-14 ENCOUNTER — Encounter: Payer: Self-pay | Admitting: Gastroenterology

## 2018-09-14 VITALS — BP 111/68 | HR 71 | Ht 71.0 in | Wt 170.2 lb

## 2018-09-14 DIAGNOSIS — K602 Anal fissure, unspecified: Secondary | ICD-10-CM

## 2018-09-14 NOTE — Progress Notes (Signed)
Shawn Antigua, MD [address]  [place]  [place], [place] [address]  Main: [phone]  Fax: [phone]   Primary Care Physician: Jerrol Banana., MD  Primary Gastroenterologist:  Dr. Vonda Crawford  Chief Complaint  Patient presents with  . Follow-up    anal fissure vs hemorroid    HPI: Shawn Crawford is a 57 y.o. male with history of anal fissure here for follow-up.  Patient has been using nifedipine ointment twice a day due to anal fissures seen during his colonoscopy.  Also uses MiraLAX daily and reports soft bowel movements daily.  No blood in stool.  However, continues to report burning sensation in the perianal region despite nifedipine ointment use.    Patient initially seen due to unintentional weight loss and underwent EGD and colonoscopy.  No further weight loss.  Good appetite.  Colonoscopy September 2019 showed anal fissure and perianal exam.  Melanosis coli.  5 mm descending colon polyp removed.  Diverticuli and nonbleeding internal hemorrhoids found.  Repeat recommended in 5 years. EGD September 2019, with gastric erythema.  Pathology negative for H. pylori.  Melanosis coli in the colon.  Tubular adenoma polyp.  Current Outpatient Medications  Medication Sig Dispense Refill  . amLODipine (NORVASC) 5 MG tablet Take 1 tablet (5 mg total) by mouth daily. 90 tablet 3  . aspirin EC 81 MG tablet Take 81 mg by mouth daily.    Marland Kitchen COENZYME Q-10 PO Take 1 capsule by mouth daily.     . fexofenadine (ALLEGRA) 180 MG tablet Take 180 mg by mouth daily as needed.     . hydrocortisone (ANUSOL-HC) 2.5 % rectal cream Place 1 application rectally 2 (two) times daily.    . metoprolol tartrate (LOPRESSOR) 25 MG tablet Take 0.5 tablets (12.5 mg total) by mouth 2 (two) times daily. 60 tablet 1  . MULTIPLE VITAMINS PO Take 1 tablet by mouth daily.     . Omega-3 Fatty Acids (FISH OIL) 1200 MG CAPS Take 1 capsule by mouth daily.     . rosuvastatin (CRESTOR) 40  MG tablet TAKE 1 TABLET BY MOUTH  DAILY 90 tablet 3  . testosterone (TESTIM) 50 MG/5GM (1%) GEL Place 5 g onto the skin daily. 30 Tube 5  . traMADol (ULTRAM) 50 MG tablet Take 2 tablets (100 mg total) by mouth 3 (three) times daily as needed for severe pain. For chronic pain To last for 30 days from fill date. 180 tablet 3  . zolpidem (AMBIEN CR) 12.5 MG CR tablet Take 1 tablet (12.5 mg total) by mouth at bedtime as needed for sleep. Reviewed PMP Aware Alerts and stopped the Zolpidem 10 mg. 30 tablet 0  . hydrocortisone (ANUSOL-HC) 25 MG suppository Place 1 suppository (25 mg total) rectally at bedtime as needed for hemorrhoids. (Patient not taking: Reported on 08/03/2018) 12 suppository 3   No current facility-administered medications for this visit.     Allergies as of 09/14/2018 - Review Complete 09/14/2018  Allergen Reaction Noted  . Penicillins Itching 05/10/2015    ROS:  General: Negative for anorexia, weight loss, fever, chills, fatigue, weakness. ENT: Negative for hoarseness, difficulty swallowing , nasal congestion. CV: Negative for chest pain, angina, palpitations, dyspnea on exertion, peripheral edema.  Respiratory: Negative for dyspnea at rest, dyspnea on exertion, cough, sputum, wheezing.  GI: See history of present illness. GU:  Negative for dysuria, hematuria, urinary incontinence, urinary frequency, nocturnal urination.  Endo: Negative for unusual weight change.    Physical  Examination:   BP 111/68   Pulse 71   Ht 5\' 11"  (1.803 m)   Wt 170 lb 3.2 oz (77.2 kg)   BMI 23.74 kg/m   General: Well-nourished, well-developed in no acute distress.  Eyes: No icterus. Conjunctivae pink. Mouth: Oropharyngeal mucosa moist and pink , no lesions erythema or exudate. Neck: Supple, Trachea midline Abdomen: Bowel sounds are normal, nontender, nondistended, no hepatosplenomegaly or masses, no abdominal bruits or hernia , no rebound or guarding.   Rectal exam: Small residual skin tag  in the perianal area.  No further anal fissures noted. Extremities: No lower extremity edema. No clubbing or deformities. Neuro: Alert and oriented x 3.  Grossly intact. Skin: Warm and dry, no jaundice.   Psych: Alert and cooperative, normal mood and affect.   Labs: CMP     Component Value Date/Time   NA 141 08/06/2018 0802   K 4.0 08/06/2018 0802   CL 98 08/06/2018 0802   CO2 26 08/06/2018 0802   GLUCOSE 117 (H) 08/06/2018 0802   BUN 12 08/06/2018 0802   CREATININE 1.09 08/06/2018 0802   CALCIUM 9.8 08/06/2018 0802   PROT 7.2 08/06/2018 0802   ALBUMIN 5.2 08/06/2018 0802   AST 33 08/06/2018 0802   ALT 40 08/06/2018 0802   ALKPHOS 69 08/06/2018 0802   BILITOT 0.3 08/06/2018 0802   GFRNONAA 74 08/06/2018 0802   GFRAA 86 08/06/2018 0802   Lab Results  Component Value Date   WBC 7.0 04/22/2018   HGB 14.0 04/22/2018   HCT 41.7 04/22/2018   MCV 90 04/22/2018   PLT 177 04/22/2018    Imaging Studies: No results found.  Assessment and Plan:   Shawn Crawford is a 57 y.o. y/o male initially referred to [place] for weight loss, now with stable weight, no signs of malignancy or any etiology of weight loss on his EGD and colonoscopy, with anal fissure seen on perianal exam  Discontinue nifedipine ointment has no further anal fissure present on physical exam However, due to burning sensation reported by patient, will refer to surgery for evaluation of exam under anesthesia to rule out any small fissure not seen on exam today Patient is agreeable with referral Continue MiraLAX daily to avoid any future fissures or trauma to the area  Dr Shawn Crawford

## 2018-09-30 ENCOUNTER — Other Ambulatory Visit: Payer: Self-pay | Admitting: Family Medicine

## 2018-09-30 MED ORDER — ZOLPIDEM TARTRATE ER 12.5 MG PO TBCR: 12.5000 mg | EXTENDED_RELEASE_TABLET | Freq: Every evening | ORAL | 5 refills | Status: DC | PRN

## 2018-09-30 NOTE — Telephone Encounter (Signed)
[facility] faxed refill request for the following medications:  zolpidem (AMBIEN CR) 12.5 MG CR tablet  Date written: 09/03/2018  Last dispensed: 09/03/2018  Please advise.

## 2018-10-04 MED ORDER — ZOLPIDEM TARTRATE ER 12.5 MG PO TBCR: 12.5000 mg | EXTENDED_RELEASE_TABLET | Freq: Every evening | ORAL | 5 refills | Status: DC | PRN

## 2018-10-18 ENCOUNTER — Ambulatory Visit: Payer: Self-pay | Admitting: Surgery

## 2018-10-18 DIAGNOSIS — K641 Second degree hemorrhoids: Secondary | ICD-10-CM | POA: Diagnosis not present

## 2018-10-18 DIAGNOSIS — K602 Anal fissure, unspecified: Secondary | ICD-10-CM | POA: Diagnosis not present

## 2018-10-18 NOTE — H&P (Signed)
Shawn Crawford Documented: 10/18/2018 8:42 AM Location: [facility] Patient #: [id number] DOB: Feb 26, 1961 Single / Language: Shawn Crawford / Race: White Male  History of Present Illness Shawn Hector MD; 10/18/2018 10:17 AM) The patient is a 58 year old male who presents with an anal fissure. Note for "Anal fissure": ` ` ` Patient sent for surgical consultation at the request of Dr Chuck Hint  Chief Complaint: Intermittent persistent anal pain with history of fissure. ` ` The patient is a pleasant gentleman that lives in [place]. He struggled with anorectal issues for many years. He recalls being diagnosed with fissures in the past. Usually resolves with some topical treatment. Had intermittent anal pain for the past year. His mom passed away from anal cancer a little while ago which concerned him. He underwent colonoscopy in the fall that noted no major abnormalities. Fissure was noted. Prescribed nifedipine cream. He take that for about 3 weeks. Initially it seemed to help but then it came back. Discuss with gastroenterology. They recommended surgical evaluation.  Patient usually moves his bowels not to often. Started on MiraLAX moving a little more regularly. Some occasional blood when he wipes. No major bleeding. He does get pain and stinging with bowel movements. No history of abscess. No history of Warder condyloma. He can walk at least a half hour before he has to stop. 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 Follow by Dr. Nehemiah Massed with cardiology. Doing well. Just on a baby aspirin. No Newhard issues and over a decade.  No personal nor family history of GI/colon cancer, inflammatory bowel disease, irritable bowel syndrome, allergy such as Celiac Sprue, dietary/dairy problems, colitis, ulcers nor gastritis. No recent sick contacts/gastroenteritis. No travel outside the country. No changes in diet. No dysphagia to solids or  liquids. No significant heartburn or reflux. No hematochezia, hematemesis, coffee ground emesis. No evidence of prior gastric/peptic ulceration.  (Review of systems as stated in this history (HPI) or in the review of systems. Otherwise all other 12 point ROS are negative) ` ` `   Allergies Sabino Gasser, CMA; 10/18/2018 8:43 AM) Penicillins Allergies Reconciled  Medication History Sabino Gasser, CMA; 10/18/2018 8:44 AM) traMADol HCl (50MG  Tablet, Oral) Active. amLODIPine Besylate (5MG  Tablet, Oral) Active. Rosuvastatin Calcium (40MG  Tablet, Oral) Active. Metoprolol Tartrate (25MG  Tablet, Oral) Active. Zolpidem Tartrate (10MG  Tablet, Oral) Active. Medications Reconciled    Vitals Sabino Gasser CMA; 10/18/2018 8:45 AM) 10/18/2018 8:44 AM Weight: 169.38 lb Height: 71in Body Surface Area: 1.97 m Body Mass Index: 23.62 kg/m  Temp.: 63F(Oral)  Pulse: 87 (Regular)  BP: 126/72 (Sitting, Left Arm, Standard)      Physical Exam Shawn Hector MD; 10/18/2018 10:18 AM)  General Mental Status-Alert. General Appearance-Not in acute distress, Not Sickly. Orientation-Oriented X3. Hydration-Well hydrated. Voice-Normal.  Integumentary Global Assessment Upon inspection and palpation of skin surfaces of the - Axillae: non-tender, no inflammation or ulceration, no drainage. and Distribution of scalp and body hair is normal. General Characteristics Temperature - normal warmth is noted.  Head and Neck Head-normocephalic, atraumatic with no lesions or palpable masses. Face Global Assessment - atraumatic, no absence of expression. Neck Global Assessment - no abnormal movements, no bruit auscultated on the right, no bruit auscultated on the left, no decreased range of motion, non-tender. Trachea-midline. Thyroid Gland Characteristics - non-tender.  Eye Eyeball - Left-Extraocular movements intact, No Nystagmus. Eyeball - Right-Extraocular  movements intact, No Nystagmus. Cornea - Left-No Hazy. Cornea - Right-No Hazy. Sclera/Conjunctiva -  Left-No scleral icterus, No Discharge. Sclera/Conjunctiva - Right-No scleral icterus, No Discharge. Pupil - Left-Direct reaction to light normal. Pupil - Right-Direct reaction to light normal.  ENMT Ears Pinna - Left - no drainage observed, no generalized tenderness observed. Right - no drainage observed, no generalized tenderness observed. Nose and Sinuses External Inspection of the Nose - no destructive lesion observed. Inspection of the nares - Left - quiet respiration. Right - quiet respiration. Mouth and Throat Lips - Upper Lip - no fissures observed, no pallor noted. Lower Lip - no fissures observed, no pallor noted. Nasopharynx - no discharge present. Oral Cavity/Oropharynx - Tongue - no dryness observed. Oral Mucosa - no cyanosis observed. Hypopharynx - no evidence of airway distress observed.  Chest and Lung Exam Inspection Movements - Normal and Symmetrical. Accessory muscles - No use of accessory muscles in breathing. Palpation Palpation of the chest reveals - Non-tender. Auscultation Breath sounds - Normal and Clear.  Cardiovascular Auscultation Rhythm - Regular. Murmurs & Other Heart Sounds - Auscultation of the heart reveals - No Murmurs and No Systolic Clicks.  Abdomen Inspection Inspection of the abdomen reveals - No Visible peristalsis and No Abnormal pulsations. Umbilicus - No Bleeding, No Urine drainage. Palpation/Percussion Palpation and Percussion of the abdomen reveal - Soft, Non Tender, No Rebound tenderness, No Rigidity (guarding) and No Cutaneous hyperesthesia. Note: Abdomen soft. Nontender. Not distended. No umbilical or incisional hernias. No guarding.  Male Genitourinary Sexual Maturity Tanner 5 - Adult hair pattern and Adult penile size and shape.  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 No fistula. No abscess. No major external hemorrhoids. Held off on digital exam, especially in light of colonoscopy 3 months ago.  Peripheral Vascular Upper Extremity Inspection - Left - No Cyanotic nailbeds, Not Ischemic. Right - No Cyanotic nailbeds, Not Ischemic.  Neurologic Neurologic evaluation reveals -normal attention span and ability to concentrate, able to name objects and repeat phrases. Appropriate fund of knowledge , normal sensation and normal coordination. Mental Status Affect - not angry, not paranoid. Cranial Nerves-Normal Bilaterally. Gait-Normal.  Neuropsychiatric Mental status exam performed with findings of-able to articulate well with normal speech/language, rate, volume and coherence, thought content normal with ability to perform basic computations and apply abstract reasoning and no evidence of hallucinations, delusions, obsessions or homicidal/suicidal ideation.  Musculoskeletal Global Assessment Spine, Ribs and Pelvis - no instability, subluxation or laxity. Right Upper Extremity - no instability, subluxation or laxity.  Lymphatic Head & Neck  General Head & Neck Lymphatics: Bilateral - Description - No Localized lymphadenopathy. Axillary  General Axillary Region: Bilateral - Description - No Localized lymphadenopathy. Femoral & Inguinal  Generalized Femoral & Inguinal Lymphatics: Left - Description - No Localized lymphadenopathy. Right - Description - No Localized lymphadenopathy.    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 Usually resolves nonoperatively but persistent recurrent symptoms despite trial of nifedipine.  I think he would benefit from examination under anesthesia with probable internal sphincterotomy. Help address hemorrhoids. He is anxious with his mother dying of anal  cancer. Underwhelming colonoscopy and CT scans reassuring against any major issue but would be good to try and help address this. He feels reassured with plan of examination anesthesia and more aggressive intervention for anal fissure.  I offered to do it in [place] since that is closer to where he lives and  works. He is interested in that, but is open to going to [facility] if [place] gives [place] no good options.  Current Plans Pt Education - Pamphlet Given - Anal Fissure: discussed with patient and provided information. Pt Education - CCS Anal Fissure (Tyland Klemens) The anatomy & physiology of the anorectal region was discussed. The pathophysiology of anal fissure and differential diagnosis was discussed. Natural history progression was discussed. I stressed the importance of a bowel regimen to have daily soft bowel movements to minimize progression of disease.  The patient's condition is not adequately controlled. Non-operative treatment has not healed the fissure. Therefore, I recommended examination under anesthesia for better examination to confirm the diagnosis and treat by lateral internal sphincterotomy to relax the spasm better & allow the fissure to heal. Technique, benefits, alternatives were discussed. I noted a good likelihood this will help address the problem. Risks such as bleeding, pain, incontinence, recurrence, heart attack, death, and other risks were discussed.  Educational handouts further explaining the pathology, treatment options, and bowel regimen were given as well. The patient expressed understanding & wishes to proceed with surgery.   ENCOUNTER FOR PREOPERATIVE EXAMINATION FOR GENERAL SURGICAL PROCEDURE (Z01.818)  Current Plans You are being scheduled for surgery- Our schedulers will call you.  You should hear from our office's scheduling department within 5 working days about the location, date, and time of surgery. We try to make accommodations for  patient's preferences in scheduling surgery, but sometimes the OR schedule or the surgeon's schedule prevents [place] from making those accommodations.  If you have not heard from our office [phone]) in 5 working days, call the office and ask for your surgeon's nurse.  If you have other questions about your diagnosis, plan, or surgery, call the office and ask for your surgeon's nurse.  Pt Education - [facility] Rectal Prep for Anorectal outpatient/office surgery: discussed with patient and provided information. Pt Education - [facility] Rectal Surgery HCI (Sultana Tierney): discussed with patient and provided information. Pt Education - [facility] Good Bowel Health (Anaija Wissink)  PROLAPSED INTERNAL HEMORRHOIDS, GRADE 2 (K64.1) Impression: At least grade 2 hemorrhoids by colonoscopy. Held off on digital exam. Most likely patient could benefit from hemorrhoidal ligation a pexy.  Continue MiraLAX or some type of fiber supplement overcompensate probable constipation that acts as a trigger for his fissure and hemorrhoid issues.  The anatomy & physiology of the anorectal region was discussed. The pathophysiology of hemorrhoids and differential diagnosis was discussed. Natural history progression was discussed. I stressed the importance of a bowel regimen to have daily soft bowel movements to minimize progression of disease. Goal of one BM / day ideal. Use of wet wipes, warm baths, avoiding straining, etc were emphasized.  Educational handouts further explaining the pathology, treatment options, and bowel regimen were given as well. The patient expressed understanding.  Current Plans Pt Education - CCS Hemorrhoids (Loran Auguste): discussed with patient and provided information.  Shawn Hector, MD, FACS, MASCRS Gastrointestinal and Minimally Invasive Surgery    [address] [place], [place] [address] [phone] Main / Paging [phone] Fax

## 2018-10-18 NOTE — H&P (View-Only) (Signed)
DEREL MCGLASSON Documented: 10/18/2018 8:42 AM Location: [facility] Patient #: [id number] DOB: Feb 16, 1961 Single / Language: Cleophus Molt / Race: White Male  History of Present Illness [name] MD; 10/18/2018 10:17 AM) The patient is a 58 year old male who presents with an anal fissure. Note for "Anal fissure": ` ` ` Patient sent for surgical consultation at the request of Dr Chuck Hint  Chief Complaint: Intermittent persistent anal pain with history of fissure. ` ` The patient is a pleasant gentleman that lives in [place]. He struggled with anorectal issues for many years. He recalls being diagnosed with fissures in the past. Usually resolves with some topical treatment. Had intermittent anal pain for the past year. His mom passed away from anal cancer a little while ago which concerned him. He underwent colonoscopy in the fall that noted no major abnormalities. Fissure was noted. Prescribed nifedipine cream. He take that for about 3 weeks. Initially it seemed to help but then it came back. Discuss with gastroenterology. They recommended surgical evaluation.  Patient usually moves his bowels not to often. Started on MiraLAX moving a little more regularly. Some occasional blood when he wipes. No major bleeding. He does get pain and stinging with bowel movements. No history of abscess. No history of Warder condyloma. He can walk at least a half hour before he has to stop. 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 Follow by Dr. Nehemiah Massed with cardiology. Doing well. Just on a baby aspirin. No Newhard issues and over a decade.  No personal nor family history of GI/colon cancer, inflammatory bowel disease, irritable bowel syndrome, allergy such as Celiac Sprue, dietary/dairy problems, colitis, ulcers nor gastritis. No recent sick contacts/gastroenteritis. No travel outside the country. No changes in diet. No dysphagia to solids or  liquids. No significant heartburn or reflux. No hematochezia, hematemesis, coffee ground emesis. No evidence of prior gastric/peptic ulceration.  (Review of systems as stated in this history (HPI) or in the review of systems. Otherwise all other 12 point ROS are negative) ` ` `   Allergies Sabino Gasser, CMA; 10/18/2018 8:43 AM) Penicillins Allergies Reconciled  Medication History Sabino Gasser, CMA; 10/18/2018 8:44 AM) traMADol HCl (50MG  Tablet, Oral) Active. amLODIPine Besylate (5MG  Tablet, Oral) Active. Rosuvastatin Calcium (40MG  Tablet, Oral) Active. Metoprolol Tartrate (25MG  Tablet, Oral) Active. Zolpidem Tartrate (10MG  Tablet, Oral) Active. Medications Reconciled    Vitals Sabino Gasser CMA; 10/18/2018 8:45 AM) 10/18/2018 8:44 AM Weight: 169.38 lb Height: 71in Body Surface Area: 1.97 m Body Mass Index: 23.62 kg/m  Temp.: 31F(Oral)  Pulse: 87 (Regular)  BP: 126/72 (Sitting, Left Arm, Standard)      Physical Exam [name] MD; 10/18/2018 10:18 AM)  General Mental Status-Alert. General Appearance-Not in acute distress, Not Sickly. Orientation-Oriented X3. Hydration-Well hydrated. Voice-Normal.  Integumentary Global Assessment Upon inspection and palpation of skin surfaces of the - Axillae: non-tender, no inflammation or ulceration, no drainage. and Distribution of scalp and body hair is normal. General Characteristics Temperature - normal warmth is noted.  Head and Neck Head-normocephalic, atraumatic with no lesions or palpable masses. Face Global Assessment - atraumatic, no absence of expression. Neck Global Assessment - no abnormal movements, no bruit auscultated on the right, no bruit auscultated on the left, no decreased range of motion, non-tender. Trachea-midline. Thyroid Gland Characteristics - non-tender.  Eye Eyeball - Left-Extraocular movements intact, No Nystagmus. Eyeball - Right-Extraocular  movements intact, No Nystagmus. Cornea - Left-No Hazy. Cornea - Right-No Hazy. Sclera/Conjunctiva -  Left-No scleral icterus, No Discharge. Sclera/Conjunctiva - Right-No scleral icterus, No Discharge. Pupil - Left-Direct reaction to light normal. Pupil - Right-Direct reaction to light normal.  ENMT Ears Pinna - Left - no drainage observed, no generalized tenderness observed. Right - no drainage observed, no generalized tenderness observed. Nose and Sinuses External Inspection of the Nose - no destructive lesion observed. Inspection of the nares - Left - quiet respiration. Right - quiet respiration. Mouth and Throat Lips - Upper Lip - no fissures observed, no pallor noted. Lower Lip - no fissures observed, no pallor noted. Nasopharynx - no discharge present. Oral Cavity/Oropharynx - Tongue - no dryness observed. Oral Mucosa - no cyanosis observed. Hypopharynx - no evidence of airway distress observed.  Chest and Lung Exam Inspection Movements - Normal and Symmetrical. Accessory muscles - No use of accessory muscles in breathing. Palpation Palpation of the chest reveals - Non-tender. Auscultation Breath sounds - Normal and Clear.  Cardiovascular Auscultation Rhythm - Regular. Murmurs & Other Heart Sounds - Auscultation of the heart reveals - No Murmurs and No Systolic Clicks.  Abdomen Inspection Inspection of the abdomen reveals - No Visible peristalsis and No Abnormal pulsations. Umbilicus - No Bleeding, No Urine drainage. Palpation/Percussion Palpation and Percussion of the abdomen reveal - Soft, Non Tender, No Rebound tenderness, No Rigidity (guarding) and No Cutaneous hyperesthesia. Note: Abdomen soft. Nontender. Not distended. No umbilical or incisional hernias. No guarding.  Male Genitourinary Sexual Maturity Tanner 5 - Adult hair pattern and Adult penile size and shape.  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 No fistula. No abscess. No major external hemorrhoids. Held off on digital exam, especially in light of colonoscopy 3 months ago.  Peripheral Vascular Upper Extremity Inspection - Left - No Cyanotic nailbeds, Not Ischemic. Right - No Cyanotic nailbeds, Not Ischemic.  Neurologic Neurologic evaluation reveals -normal attention span and ability to concentrate, able to name objects and repeat phrases. Appropriate fund of knowledge , normal sensation and normal coordination. Mental Status Affect - not angry, not paranoid. Cranial Nerves-Normal Bilaterally. Gait-Normal.  Neuropsychiatric Mental status exam performed with findings of-able to articulate well with normal speech/language, rate, volume and coherence, thought content normal with ability to perform basic computations and apply abstract reasoning and no evidence of hallucinations, delusions, obsessions or homicidal/suicidal ideation.  Musculoskeletal Global Assessment Spine, Ribs and Pelvis - no instability, subluxation or laxity. Right Upper Extremity - no instability, subluxation or laxity.  Lymphatic Head & Neck  General Head & Neck Lymphatics: Bilateral - Description - No Localized lymphadenopathy. Axillary  General Axillary Region: Bilateral - Description - No Localized lymphadenopathy. Femoral & Inguinal  Generalized Femoral & Inguinal Lymphatics: Left - Description - No Localized lymphadenopathy. Right - Description - No Localized lymphadenopathy.    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 Usually resolves nonoperatively but persistent recurrent symptoms despite trial of nifedipine.  I think he would benefit from examination under anesthesia with probable internal sphincterotomy. Help address hemorrhoids. He is anxious with his mother dying of anal  cancer. Underwhelming colonoscopy and CT scans reassuring against any major issue but would be good to try and help address this. He feels reassured with plan of examination anesthesia and more aggressive intervention for anal fissure.  I offered to do it in [place] since that is closer to where he lives and  works. He is interested in that, but is open to going to [facility] if [place] gives [place] no good options.  Current Plans Pt Education - Pamphlet Given - Anal Fissure: discussed with patient and provided information. Pt Education - CCS Anal Fissure (Imari Reen) The anatomy & physiology of the anorectal region was discussed. The pathophysiology of anal fissure and differential diagnosis was discussed. Natural history progression was discussed. I stressed the importance of a bowel regimen to have daily soft bowel movements to minimize progression of disease.  The patient's condition is not adequately controlled. Non-operative treatment has not healed the fissure. Therefore, I recommended examination under anesthesia for better examination to confirm the diagnosis and treat by lateral internal sphincterotomy to relax the spasm better & allow the fissure to heal. Technique, benefits, alternatives were discussed. I noted a good likelihood this will help address the problem. Risks such as bleeding, pain, incontinence, recurrence, heart attack, death, and other risks were discussed.  Educational handouts further explaining the pathology, treatment options, and bowel regimen were given as well. The patient expressed understanding & wishes to proceed with surgery.   ENCOUNTER FOR PREOPERATIVE EXAMINATION FOR GENERAL SURGICAL PROCEDURE (Z01.818)  Current Plans You are being scheduled for surgery- Our schedulers will call you.  You should hear from our office's scheduling department within 5 working days about the location, date, and time of surgery. We try to make accommodations for  patient's preferences in scheduling surgery, but sometimes the OR schedule or the surgeon's schedule prevents [place] from making those accommodations.  If you have not heard from our office [phone]) in 5 working days, call the office and ask for your surgeon's nurse.  If you have other questions about your diagnosis, plan, or surgery, call the office and ask for your surgeon's nurse.  Pt Education - [facility] Rectal Prep for Anorectal outpatient/office surgery: discussed with patient and provided information. Pt Education - [facility] Rectal Surgery HCI (Tyre Beaver): discussed with patient and provided information. 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 Held off on digital exam. Most likely patient could benefit from hemorrhoidal ligation a pexy.  Continue MiraLAX or some type of fiber supplement overcompensate probable constipation that acts as a trigger for his fissure and hemorrhoid issues.  The anatomy & physiology of the anorectal region was discussed. The pathophysiology of hemorrhoids and differential diagnosis was discussed. Natural history progression was discussed. I stressed the importance of a bowel regimen to have daily soft bowel movements to minimize progression of disease. Goal of one BM / day ideal. Use of wet wipes, warm baths, avoiding straining, etc were emphasized.  Educational handouts further explaining the pathology, treatment options, and bowel regimen were given as well. The patient expressed understanding.  Current Plans Pt Education - CCS Hemorrhoids (Judine Arciniega): discussed with patient and provided information.  [name], MD, FACS, MASCRS Gastrointestinal and Minimally Invasive Surgery    [address], [place], [place] [address] [phone] Main / Paging [phone] Fax

## 2018-11-01 ENCOUNTER — Other Ambulatory Visit: Payer: Self-pay | Admitting: Family Medicine

## 2018-11-05 NOTE — Patient Instructions (Addendum)
Cardale Dorer  11/05/2018   Your procedure is scheduled on: 11-11-18    Report to [facility] Main  Entrance    Report to Admitting at 12:00 PM    Call this number if you have problems the morning of surgery [phone]    Remember: Please consume a Clear Liquid Diet the Day of Prep. NO SOLID FOOD AFTER MIDNIGHT THE NIGHT PRIOR TO SURGERY. NOTHING BY MOUTH EXCEPT CLEAR LIQUIDS UNTIL 3 HOURS PRIOR TO [place] SURGERY. PLEASE FINISH ENSURE DRINK PER SURGEON ORDER 3 HOURS PRIOR TO SCHEDULED SURGERY TIME WHICH NEEDS TO BE COMPLETED AT 11:00 AM.      CLEAR LIQUID DIET   Foods Allowed                                                                     Foods Excluded  Coffee and tea, regular and decaf                             liquids that you cannot  Plain Jell-O in any flavor                                             see through such as: Fruit ices (not with fruit pulp)                                     milk, soups, orange juice  Iced Popsicles                                    All solid food Carbonated beverages, regular and diet                                    Cranberry, grape and apple juices Sports drinks like Gatorade Lightly seasoned clear broth or consume(fat free) Sugar, honey syrup  Sample Menu Breakfast                                Lunch                                     Supper Cranberry juice                    Beef broth                            Chicken broth Jell-O  Grape juice                           Apple juice Coffee or tea                        Jell-O                                      Popsicle                                                Coffee or tea                        Coffee or tea  _____________________________________________________________________       BRUSH YOUR TEETH MORNING OF SURGERY AND RINSE YOUR MOUTH OUT, NO CHEWING GUM CANDY OR MINTS.     Take these  medicines the morning of surgery with A SIP OF WATER: Amlodipine (Norvasc), Rosuvastatin (Crestor), and Metoprolol Tartrate                                 You may not have any metal on your body including hair pins and              piercings  Do not wear jewelry, cologne, lotions, powders or deodorant             Men may shave face and neck.   Do not bring valuables to the hospital. [place].  Contacts, dentures or bridgework may not be worn into surgery.     Patients discharged the day of surgery will not be allowed to drive home. IF YOU ARE HAVING SURGERY AND GOING HOME THE SAME DAY, YOU MUST HAVE AN ADULT TO DRIVE YOU HOME AND BE WITH YOU FOR 24 HOURS. YOU MAY GO HOME BY TAXI OR UBER OR ORTHERWISE, BUT AN ADULT MUST ACCOMPANY YOU HOME AND STAY WITH YOU FOR 24 HOURS.    Name and phone number of your driver: Fredrich Birks [phone]  Special Instructions: N/A              Please read over the following fact sheets you were given: _____________________________________________________________________  [facility] - Preparing for Surgery Before surgery, you can play an important role.  Because skin is not sterile, your skin needs to be as free of germs as possible.  You can reduce the number of germs on your skin by washing with CHG (chlorahexidine gluconate) soap before surgery.  CHG is an antiseptic cleaner which kills germs and bonds with the skin to continue killing germs even after washing. Please DO NOT use if you have an allergy to CHG or antibacterial soaps.  If your skin becomes reddened/irritated stop using the CHG and inform your nurse when you arrive at Short Stay. Do not shave (including legs and underarms) for at least 48 hours prior to the first CHG shower.  You may shave your face/neck. Please follow these instructions carefully:  1.  Shower with CHG Soap  the night before surgery and the  morning of Surgery.  2.  If you  choose to wash your hair, wash your hair first as usual with your  normal  shampoo.  3.  After you shampoo, rinse your hair and body thoroughly to remove the  shampoo.                           4.  Use CHG as you would any other liquid soap.  You can apply chg directly  to the skin and wash                       Gently with a scrungie or clean washcloth.  5.  Apply the CHG Soap to your body ONLY FROM THE NECK DOWN.   Do not use on face/ open                           Wound or open sores. Avoid contact with eyes, ears mouth and genitals (private parts).                       Wash face,  Genitals (private parts) with your normal soap.             6.  Wash thoroughly, paying special attention to the area where your surgery  will be performed.  7.  Thoroughly rinse your body with warm water from the neck down.  8.  DO NOT shower/wash with your normal soap after using and rinsing off  the CHG Soap.                9.  Pat yourself dry with a clean towel.            10.  Wear clean pajamas.            11.  Place clean sheets on your bed the night of your first shower and do not  sleep with pets. Day of Surgery : Do not apply any lotions/deodorants the morning of surgery.  Please wear clean clothes to the hospital/surgery center.  FAILURE TO FOLLOW THESE INSTRUCTIONS MAY RESULT IN THE CANCELLATION OF YOUR SURGERY PATIENT SIGNATURE_________________________________  NURSE SIGNATURE__________________________________  ________________________________________________________________________

## 2018-11-08 ENCOUNTER — Encounter (HOSPITAL_COMMUNITY): Payer: Self-pay

## 2018-11-08 ENCOUNTER — Other Ambulatory Visit: Payer: Self-pay

## 2018-11-08 ENCOUNTER — Encounter (HOSPITAL_COMMUNITY)
Admission: RE | Admit: 2018-11-08 | Discharge: 2018-11-08 | Disposition: A | Discharge status: Home / Self Care | Payer: 59 | Source: Ambulatory Visit | Attending: Surgery | Admitting: Surgery

## 2018-11-08 DIAGNOSIS — Z79899 Other long term (current) drug therapy: Secondary | ICD-10-CM | POA: Diagnosis not present

## 2018-11-08 DIAGNOSIS — K601 Chronic anal fissure: Secondary | ICD-10-CM | POA: Diagnosis not present

## 2018-11-08 DIAGNOSIS — K641 Second degree hemorrhoids: Secondary | ICD-10-CM | POA: Diagnosis not present

## 2018-11-08 DIAGNOSIS — Z800 Family history of malignant neoplasm of digestive organs: Secondary | ICD-10-CM | POA: Diagnosis not present

## 2018-11-08 DIAGNOSIS — Z01818 Encounter for other preprocedural examination: Secondary | ICD-10-CM | POA: Insufficient documentation

## 2018-11-08 DIAGNOSIS — Z79891 Long term (current) use of opiate analgesic: Secondary | ICD-10-CM | POA: Diagnosis not present

## 2018-11-08 DIAGNOSIS — K602 Anal fissure, unspecified: Secondary | ICD-10-CM | POA: Diagnosis present

## 2018-11-08 DIAGNOSIS — Z7982 Long term (current) use of aspirin: Secondary | ICD-10-CM | POA: Diagnosis not present

## 2018-11-08 DIAGNOSIS — Z951 Presence of aortocoronary bypass graft: Secondary | ICD-10-CM | POA: Diagnosis not present

## 2018-11-08 DIAGNOSIS — E785 Hyperlipidemia, unspecified: Secondary | ICD-10-CM | POA: Diagnosis not present

## 2018-11-08 DIAGNOSIS — I2510 Atherosclerotic heart disease of native coronary artery without angina pectoris: Secondary | ICD-10-CM | POA: Diagnosis not present

## 2018-11-08 DIAGNOSIS — I10 Essential (primary) hypertension: Secondary | ICD-10-CM | POA: Diagnosis not present

## 2018-11-08 LAB — BASIC METABOLIC PANEL
Anion gap: 8 (ref 5–15)
BUN: 16 mg/dL (ref 6–20)
CO2: 29 mmol/L (ref 22–32)
Calcium: 9.3 mg/dL (ref 8.9–10.3)
Chloride: 105 mmol/L (ref 98–111)
Creatinine, Ser: 1.11 mg/dL (ref 0.61–1.24)
GFR calc Af Amer: >60 mL/min (ref >60)
GFR calc non Af Amer: >60 mL/min (ref >60)
Glucose, Bld: 117 mg/dL — ABNORMAL HIGH (ref 70–99)
Potassium: 4.3 mmol/L (ref 3.5–5.1)
Sodium: 142 mmol/L (ref 135–145)

## 2018-11-08 LAB — CBC
HCT: 40.7 % (ref 39.0–52.0)
Hemoglobin: 13.5 g/dL (ref 13.0–17.0)
MCH: 30 pg (ref 26.0–34.0)
MCHC: 33.2 g/dL (ref 30.0–36.0)
MCV: 90.4 fL (ref 80.0–100.0)
Platelets: 172 10*3/uL (ref 150–400)
RBC: 4.5 MIL/uL (ref 4.22–5.81)
RDW: 11.5 % (ref 11.5–15.5)
WBC: 7.3 10*3/uL (ref 4.0–10.5)
nRBC: 0 % (ref 0.0–0.2)

## 2018-11-09 NOTE — Progress Notes (Signed)
Cardiac Clearance from Dr. Nehemiah Massed on chart.

## 2018-11-09 NOTE — Anesthesia Preprocedure Evaluation (Addendum)
Anesthesia Evaluation  Patient identified by MRN, date of birth, ID band Patient awake    Reviewed: Allergy & Precautions, NPO status , Patient's Chart, lab work & pertinent test results, reviewed documented beta blocker date and time   History of Anesthesia Complications Negative for: history of anesthetic complications  Airway Mallampati: I  TM Distance: >3 FB Neck ROM: Full    Dental  (+) Dental Advisory Given   Pulmonary neg pulmonary ROS,    breath sounds clear to auscultation       Cardiovascular hypertension, Pt. on medications and Pt. on home beta blockers (-) angina+ CAD and + CABG (2003)   Rhythm:Regular Rate:Normal  nuclear stress test in July, 2013 which showed no evidence of myocardial ischemia   Neuro/Psych negative neurological ROS     GI/Hepatic Neg liver ROS, GERD  Controlled,  Endo/Other  negative endocrine ROS  Renal/GU negative Renal ROS     Musculoskeletal  (+) Arthritis ,   Abdominal   Peds  Hematology negative hematology ROS (+)   Anesthesia Other Findings   Reproductive/Obstetrics                           Anesthesia Physical Anesthesia Plan  ASA: III  Anesthesia Plan: General   Post-op Pain Management:    Induction: Intravenous  PONV Risk Score and Plan: 2 and Ondansetron and Dexamethasone  Airway Management Planned: Oral ETT  Additional Equipment:   Intra-op Plan:   Post-operative Plan: Extubation in OR  Informed Consent: I have reviewed the patients History and Physical, chart, labs and discussed the procedure including the risks, benefits and alternatives for the proposed anesthesia with the patient or authorized representative who has indicated his/her understanding and acceptance.     Dental advisory given  Plan Discussed with: CRNA and Surgeon  Anesthesia Plan Comments: (See PST note 11/08/2018, Konrad Felix, PA-C Plan routine monitors,  GETA)       Anesthesia Quick Evaluation

## 2018-11-09 NOTE — Progress Notes (Signed)
Anesthesia Chart Review   Case:  [id number] Date/Time:  11/11/18 1345   Procedure:  ANAL SPHINCTEROTOMY HEMORRHOIDAL LIGATION/PEXY ANORECTAL EXAM UNDER ANESTHESIA WITH POSSIBLE  HEMORRHOIDECTOMY (N/A )   Anesthesia type:  General   Pre-op diagnosis:  ANAL FISSURE INTERNAL HEMORRHOIDS WITH BLEEDING   Location:  [facility] ROOM 05 / [facility]   Surgeon:  Michael Boston, MD      DISCUSSION: 58 yo never smoker with h/o HTN, CAD, PVD, HLD, anal fissure and internal hemorrhoids with bleeding scheduled for above procedure on 11/11/18 with Dr. Michael Boston.   Pt last seen by cardiologist, Dr. Serafina Royals, on 08/09/18.  Per Dr. Erin Fulling, "pt is clear for surgery, Low risk."  Pt can proceed with planned procedure barring acute status change.  VS: BP 130/74   Pulse 76   Temp 37.1 C (Oral)   Resp 18   Ht 5\' 11"  (1.803 m)   Wt 74.9 kg   SpO2 100%   BMI 23.03 kg/m   PROVIDERS: Jerrol Banana., MD is PCP   Serafina Royals, MD is Cardiologist  LABS: Labs reviewed: Acceptable for surgery. (all labs ordered are listed, but only abnormal results are displayed)  Labs Reviewed  BASIC METABOLIC PANEL - Abnormal; Notable for the following components:      Result Value   Glucose, Bld 117 (*)    All other components within normal limits  CBC     IMAGES:   EKG: 11/08/2018 Rate 68 bpm Normal sinus rhythm  Right bundle branch block  Abnormal ECG No old tracing to compare   CV:  Past Medical History:  Diagnosis Date  . Hx of cardiac catheterization   . Hyperlipidemia   . Hypertension     Past Surgical History:  Procedure Laterality Date  . APPENDECTOMY    . CARDIAC CATHETERIZATION    . COLONOSCOPY WITH PROPOFOL N/A 06/24/2018   Procedure: COLONOSCOPY WITH PROPOFOL;  Surgeon: Virgel Manifold, MD;  Location: [facility] END[facility]COPY;  Service: Endoscopy;  Laterality: N/A;  . CORONARY ARTERY BYPASS GRAFT     x 3  . ESOPHAGOGASTRODUODENOSCOPY (EGD) WITH PROPOFOL N/A 06/24/2018   Procedure:  ESOPHAGOGASTRODUODENOSCOPY (EGD) WITH PROPOFOL;  Surgeon: Virgel Manifold, MD;  Location: [facility] ENDOSCOPY;  Service: Endoscopy;  Laterality: N/A;  . HERNIA REPAIR      MEDICATIONS: . amLODipine (NORVASC) 5 MG tablet  . aspirin EC 81 MG tablet  . metoprolol tartrate (LOPRESSOR) 25 MG tablet  . MULTIPLE VITAMINS PO  . rosuvastatin (CRESTOR) 40 MG tablet  . testosterone (TESTIM) 50 MG/5GM (1%) GEL  . traMADol (ULTRAM) 50 MG tablet  . zolpidem (AMBIEN CR) 12.5 MG CR tablet   No current facility-administered medications for this encounter.     Maia Plan [facility] Pre-Surgical Testing [phone] 11/09/18 1:58 PM

## 2018-11-10 MED ORDER — CLINDAMYCIN PHOSPHATE 900 MG/50ML IV SOLN: 900.0000 mg | INTRAVENOUS | Status: AC

## 2018-11-10 MED ORDER — BUPIVACAINE LIPOSOME 1.3 % IJ SUSP
20.0000 mL | INTRAMUSCULAR | Status: DC
  Filled 2018-11-10: qty 20

## 2018-11-10 MED ORDER — GENTAMICIN SULFATE 40 MG/ML IJ SOLN
5.0000 mg/kg | INTRAVENOUS | Status: AC
  Administered 2018-11-11: 370 mg via INTRAVENOUS
  Filled 2018-11-10: qty 9.25

## 2018-11-11 ENCOUNTER — Encounter (HOSPITAL_COMMUNITY)
Admission: RE | Disposition: A | Discharge status: Home / Self Care | Payer: Self-pay | Source: Ambulatory Visit | Attending: Surgery

## 2018-11-11 ENCOUNTER — Ambulatory Visit (HOSPITAL_COMMUNITY)
Admission: RE | Admit: 2018-11-11 | Discharge: 2018-11-11 | Disposition: A | Discharge status: Home / Self Care | Payer: 59 | Source: Ambulatory Visit | Attending: Surgery | Admitting: Surgery

## 2018-11-11 ENCOUNTER — Ambulatory Visit (HOSPITAL_COMMUNITY): Payer: 59 | Admitting: Anesthesiology

## 2018-11-11 ENCOUNTER — Encounter (HOSPITAL_COMMUNITY): Payer: Self-pay

## 2018-11-11 ENCOUNTER — Other Ambulatory Visit: Payer: Self-pay | Admitting: Family Medicine

## 2018-11-11 ENCOUNTER — Ambulatory Visit (HOSPITAL_COMMUNITY): Payer: 59 | Admitting: Physician Assistant

## 2018-11-11 DIAGNOSIS — I10 Essential (primary) hypertension: Secondary | ICD-10-CM | POA: Diagnosis not present

## 2018-11-11 DIAGNOSIS — Z7982 Long term (current) use of aspirin: Secondary | ICD-10-CM | POA: Insufficient documentation

## 2018-11-11 DIAGNOSIS — K649 Unspecified hemorrhoids: Secondary | ICD-10-CM | POA: Diagnosis not present

## 2018-11-11 DIAGNOSIS — Z79899 Other long term (current) drug therapy: Secondary | ICD-10-CM | POA: Insufficient documentation

## 2018-11-11 DIAGNOSIS — Z951 Presence of aortocoronary bypass graft: Secondary | ICD-10-CM | POA: Insufficient documentation

## 2018-11-11 DIAGNOSIS — Z79891 Long term (current) use of opiate analgesic: Secondary | ICD-10-CM | POA: Insufficient documentation

## 2018-11-11 DIAGNOSIS — K602 Anal fissure, unspecified: Secondary | ICD-10-CM | POA: Diagnosis not present

## 2018-11-11 DIAGNOSIS — K648 Other hemorrhoids: Secondary | ICD-10-CM | POA: Diagnosis present

## 2018-11-11 DIAGNOSIS — E785 Hyperlipidemia, unspecified: Secondary | ICD-10-CM | POA: Insufficient documentation

## 2018-11-11 DIAGNOSIS — I2510 Atherosclerotic heart disease of native coronary artery without angina pectoris: Secondary | ICD-10-CM | POA: Insufficient documentation

## 2018-11-11 DIAGNOSIS — K601 Chronic anal fissure: Secondary | ICD-10-CM | POA: Insufficient documentation

## 2018-11-11 DIAGNOSIS — K641 Second degree hemorrhoids: Secondary | ICD-10-CM | POA: Insufficient documentation

## 2018-11-11 DIAGNOSIS — Z800 Family history of malignant neoplasm of digestive organs: Secondary | ICD-10-CM | POA: Insufficient documentation

## 2018-11-11 HISTORY — PX: EVALUATION UNDER ANESTHESIA WITH HEMORRHOIDECTOMY: SHX5624

## 2018-11-11 SURGERY — EXAM UNDER ANESTHESIA WITH HEMORRHOIDECTOMY
Anesthesia: General

## 2018-11-11 MED ORDER — BUPIVACAINE HCL (PF) 0.25 % IJ SOLN
INTRAMUSCULAR | Status: DC | PRN
  Administered 2018-11-11: 20 mL

## 2018-11-11 MED ORDER — BUPIVACAINE LIPOSOME 1.3 % IJ SUSP
INTRAMUSCULAR | Status: DC | PRN
  Administered 2018-11-11: 20 mL

## 2018-11-11 MED ORDER — DEXAMETHASONE SODIUM PHOSPHATE 10 MG/ML IJ SOLN
INTRAMUSCULAR | Status: DC | PRN
  Administered 2018-11-11: 10 mg via INTRAVENOUS

## 2018-11-11 MED ORDER — EPHEDRINE SULFATE-NACL 50-0.9 MG/10ML-% IV SOSY
PREFILLED_SYRINGE | INTRAVENOUS | Status: DC | PRN
  Administered 2018-11-11: 10 mg via INTRAVENOUS

## 2018-11-11 MED ORDER — LIDOCAINE 2% (20 MG/ML) 5 ML SYRINGE
INTRAMUSCULAR | Status: DC | PRN
  Administered 2018-11-11: 60 mg via INTRAVENOUS

## 2018-11-11 MED ORDER — MEPERIDINE HCL 50 MG/ML IJ SOLN: 6.2500 mg | INTRAMUSCULAR | Status: DC | PRN

## 2018-11-11 MED ORDER — FENTANYL CITRATE (PF) 100 MCG/2ML IJ SOLN
INTRAMUSCULAR | Status: AC
  Filled 2018-11-11: qty 2

## 2018-11-11 MED ORDER — PROPOFOL 10 MG/ML IV BOLUS
INTRAVENOUS | Status: DC | PRN
  Administered 2018-11-11: 200 mg via INTRAVENOUS

## 2018-11-11 MED ORDER — CELECOXIB 200 MG PO CAPS
200.0000 mg | ORAL_CAPSULE | ORAL | Status: AC
  Administered 2018-11-11: 200 mg via ORAL
  Filled 2018-11-11: qty 1

## 2018-11-11 MED ORDER — GABAPENTIN 300 MG PO CAPS: 300.0000 mg | ORAL_CAPSULE | Freq: Two times a day (BID) | ORAL | 1 refills | Status: DC

## 2018-11-11 MED ORDER — METHYLENE BLUE 0.5 % INJ SOLN
INTRAVENOUS | Status: AC
  Filled 2018-11-11: qty 10

## 2018-11-11 MED ORDER — OXYCODONE HCL 5 MG PO TABS: 5.0000 mg | ORAL_TABLET | Freq: Four times a day (QID) | ORAL | 0 refills | Status: DC | PRN

## 2018-11-11 MED ORDER — ONDANSETRON HCL 4 MG/2ML IJ SOLN
INTRAMUSCULAR | Status: DC | PRN
  Administered 2018-11-11: 4 mg via INTRAVENOUS

## 2018-11-11 MED ORDER — CHLORHEXIDINE GLUCONATE CLOTH 2 % EX PADS: 6.0000 | MEDICATED_PAD | Freq: Once | CUTANEOUS | Status: DC

## 2018-11-11 MED ORDER — ROCURONIUM BROMIDE 10 MG/ML (PF) SYRINGE
PREFILLED_SYRINGE | INTRAVENOUS | Status: DC | PRN
  Administered 2018-11-11: 40 mg via INTRAVENOUS

## 2018-11-11 MED ORDER — HYDROMORPHONE HCL 1 MG/ML IJ SOLN
0.2500 mg | INTRAMUSCULAR | Status: DC | PRN
  Administered 2018-11-11 (×2): 0.25 mg via INTRAVENOUS

## 2018-11-11 MED ORDER — DIBUCAINE 1 % RE OINT
TOPICAL_OINTMENT | RECTAL | Status: AC
  Filled 2018-11-11: qty 28

## 2018-11-11 MED ORDER — PROMETHAZINE HCL 25 MG/ML IJ SOLN: 6.2500 mg | INTRAMUSCULAR | Status: DC | PRN

## 2018-11-11 MED ORDER — DIBUCAINE 1 % RE OINT
TOPICAL_OINTMENT | RECTAL | Status: DC | PRN
  Administered 2018-11-11: 1 via RECTAL

## 2018-11-11 MED ORDER — ROCURONIUM BROMIDE 100 MG/10ML IV SOLN
INTRAVENOUS | Status: AC
  Filled 2018-11-11: qty 1

## 2018-11-11 MED ORDER — MIDAZOLAM HCL 2 MG/2ML IJ SOLN: 0.5000 mg | Freq: Once | INTRAMUSCULAR | Status: DC | PRN

## 2018-11-11 MED ORDER — PHENYLEPHRINE 40 MCG/ML (10ML) SYRINGE FOR IV PUSH (FOR BLOOD PRESSURE SUPPORT)
PREFILLED_SYRINGE | INTRAVENOUS | Status: DC | PRN
  Administered 2018-11-11 (×4): 80 ug via INTRAVENOUS

## 2018-11-11 MED ORDER — BUPIVACAINE HCL (PF) 0.25 % IJ SOLN
INTRAMUSCULAR | Status: AC
  Filled 2018-11-11: qty 60

## 2018-11-11 MED ORDER — HYDROMORPHONE HCL 1 MG/ML IJ SOLN
INTRAMUSCULAR | Status: AC
  Administered 2018-11-11: 0.25 mg via INTRAVENOUS
  Filled 2018-11-11: qty 1

## 2018-11-11 MED ORDER — SUGAMMADEX SODIUM 200 MG/2ML IV SOLN
INTRAVENOUS | Status: AC
  Filled 2018-11-11: qty 2

## 2018-11-11 MED ORDER — GABAPENTIN 300 MG PO CAPS
300.0000 mg | ORAL_CAPSULE | ORAL | Status: AC
  Administered 2018-11-11: 300 mg via ORAL
  Filled 2018-11-11: qty 1

## 2018-11-11 MED ORDER — 0.9 % SODIUM CHLORIDE (POUR BTL) OPTIME
TOPICAL | Status: DC | PRN
  Administered 2018-11-11: 1000 mL

## 2018-11-11 MED ORDER — LIDOCAINE 2% (20 MG/ML) 5 ML SYRINGE
INTRAMUSCULAR | Status: AC
  Filled 2018-11-11: qty 5

## 2018-11-11 MED ORDER — MIDAZOLAM HCL 2 MG/2ML IJ SOLN
INTRAMUSCULAR | Status: AC
  Filled 2018-11-11: qty 2

## 2018-11-11 MED ORDER — CLINDAMYCIN PHOSPHATE 900 MG/50ML IV SOLN
INTRAVENOUS | Status: AC
  Filled 2018-11-11: qty 50

## 2018-11-11 MED ORDER — SUGAMMADEX SODIUM 200 MG/2ML IV SOLN
INTRAVENOUS | Status: DC | PRN
  Administered 2018-11-11: 200 mg via INTRAVENOUS

## 2018-11-11 MED ORDER — LACTATED RINGERS IV SOLN
INTRAVENOUS | Status: DC
  Administered 2018-11-11 (×2): via INTRAVENOUS

## 2018-11-11 MED ORDER — FENTANYL CITRATE (PF) 250 MCG/5ML IJ SOLN
INTRAMUSCULAR | Status: DC | PRN
  Administered 2018-11-11: 50 ug via INTRAVENOUS
  Administered 2018-11-11: 100 ug via INTRAVENOUS
  Administered 2018-11-11: 50 ug via INTRAVENOUS

## 2018-11-11 MED ORDER — PROPOFOL 10 MG/ML IV BOLUS
INTRAVENOUS | Status: AC
  Filled 2018-11-11: qty 20

## 2018-11-11 MED ORDER — ONDANSETRON HCL 4 MG/2ML IJ SOLN
INTRAMUSCULAR | Status: AC
  Filled 2018-11-11: qty 2

## 2018-11-11 MED ORDER — EPHEDRINE 5 MG/ML INJ
INTRAVENOUS | Status: AC
  Filled 2018-11-11: qty 10

## 2018-11-11 MED ORDER — MIDAZOLAM HCL 5 MG/5ML IJ SOLN
INTRAMUSCULAR | Status: DC | PRN
  Administered 2018-11-11: 2 mg via INTRAVENOUS

## 2018-11-11 MED ORDER — ACETAMINOPHEN 500 MG PO TABS
1000.0000 mg | ORAL_TABLET | ORAL | Status: AC
  Administered 2018-11-11: 1000 mg via ORAL
  Filled 2018-11-11: qty 2

## 2018-11-11 MED ORDER — DEXAMETHASONE SODIUM PHOSPHATE 10 MG/ML IJ SOLN
INTRAMUSCULAR | Status: AC
  Filled 2018-11-11: qty 1

## 2018-11-11 MED ORDER — CLINDAMYCIN PHOSPHATE 900 MG/50ML IV SOLN
INTRAVENOUS | Status: DC | PRN
  Administered 2018-11-11: 900 mg via INTRAVENOUS

## 2018-11-11 SURGICAL SUPPLY — 34 items
BENZOIN TINCTURE PRP APPL 2/3 (GAUZE/BANDAGES/DRESSINGS) ×2 IMPLANT
BLADE SURG 15 STRL LF DISP TIS (BLADE) IMPLANT
BLADE SURG 15 STRL SS (BLADE)
BRIEF STRETCH FOR OB PAD LRG (UNDERPADS AND DIAPERS) ×2 IMPLANT
CONT SPEC 4OZ CLIKSEAL STRL BL (MISCELLANEOUS) ×2 IMPLANT
COVER SURGICAL LIGHT HANDLE (MISCELLANEOUS) ×2 IMPLANT
COVER WAND RF STERILE (DRAPES) IMPLANT
DECANTER SPIKE VIAL GLASS SM (MISCELLANEOUS) ×2 IMPLANT
DRAPE LAPAROTOMY T 102X78X121 (DRAPES) ×2 IMPLANT
DRSG PAD ABDOMINAL 8X10 ST (GAUZE/BANDAGES/DRESSINGS) IMPLANT
ELECT PENCIL ROCKER SW 15FT (MISCELLANEOUS) ×2 IMPLANT
ELECT REM PT RETURN 15FT ADLT (MISCELLANEOUS) ×2 IMPLANT
GAUZE 4X4 16PLY RFD (DISPOSABLE) ×2 IMPLANT
GAUZE SPONGE 4X4 12PLY STRL (GAUZE/BANDAGES/DRESSINGS) ×2 IMPLANT
GLOVE ECLIPSE 8.0 STRL XLNG CF (GLOVE) ×2 IMPLANT
GLOVE INDICATOR 8.0 STRL GRN (GLOVE) ×2 IMPLANT
GOWN STRL REUS W/TWL XL LVL3 (GOWN DISPOSABLE) ×4 IMPLANT
KIT BASIN OR (CUSTOM PROCEDURE TRAY) ×2 IMPLANT
LOOP VESSEL MAXI BLUE (MISCELLANEOUS) IMPLANT
NEEDLE HYPO 22GX1.5 SAFETY (NEEDLE) ×2 IMPLANT
PACK BASIC VI WITH GOWN DISP (CUSTOM PROCEDURE TRAY) ×2 IMPLANT
PAD ABD 8X10 STRL (GAUZE/BANDAGES/DRESSINGS) ×2 IMPLANT
SHEARS HARMONIC 9CM CVD (BLADE) IMPLANT
SURGILUBE 2OZ TUBE FLIPTOP (MISCELLANEOUS) ×2 IMPLANT
SUT CHROMIC 2 0 SH (SUTURE) ×2 IMPLANT
SUT CHROMIC 3 0 SH 27 (SUTURE) IMPLANT
SUT VIC AB 2-0 SH 27 (SUTURE)
SUT VIC AB 2-0 SH 27X BRD (SUTURE) IMPLANT
SUT VIC AB 2-0 UR6 27 (SUTURE) ×12 IMPLANT
SYR 20CC LL (SYRINGE) ×2 IMPLANT
SYR 3ML LL SCALE MARK (SYRINGE) IMPLANT
TOWEL OR 17X26 10 PK STRL BLUE (TOWEL DISPOSABLE) ×2 IMPLANT
TOWEL OR NON WOVEN STRL DISP B (DISPOSABLE) ×2 IMPLANT
YANKAUER SUCT BULB TIP 10FT TU (MISCELLANEOUS) ×2 IMPLANT

## 2018-11-11 NOTE — Interval H&P Note (Signed)
History and Physical Interval Note:  11/11/2018 12:29 PM  Shawn Crawford  has presented today for surgery, with the diagnosis of [place]  The various methods of treatment have been discussed with the patient and family. After consideration of risks, benefits and other options for treatment, the patient has consented to  Procedure(s): [place] (N/A) as a surgical intervention .  The patient's history has been reviewed, patient examined, no change in status, stable for surgery.  I have reviewed the patient's chart and labs.  Questions were answered to the patient's satisfaction.    I have re-reviewed the the patient's records, history, medications, and allergies.  I have re-examined the patient.  I again discussed intraoperative plans and goals of post-operative recovery.  The patient agrees to proceed.  Shawn Crawford  [birth year] [id number]  Patient Care Team: Jerrol Banana., MD as PCP - General (Family Medicine) Michael Boston, MD as Consulting Physician (General Surgery) Corey Skains, MD as Consulting Physician (Cardiology) Virgel Manifold, MD as Consulting Physician (Gastroenterology)  Patient Active Problem List   Diagnosis Date Noted  . Special screening for malignant neoplasms, colon   . Benign neoplasm of descending colon   . Diverticulosis of large intestine without diverticulitis   . Anal fissure   . Melanosis of colon   . Internal hemorrhoids   . Lumbar degenerative disc disease 04/06/2018  . Piriformis muscle pain 04/06/2018  . Chronic pain syndrome 10/22/2017  . Chronic midline low back pain without sciatica 10/22/2017  . Lumbar facet arthropathy 10/22/2017  . Hyperglycemia 05/30/2015  . Abnormal blood sugar 05/10/2015  . Arteriosclerosis of coronary artery 05/10/2015  . ED (erectile dysfunction) of organic  origin 05/10/2015  . Acid reflux 05/10/2015  . HLD (hyperlipidemia) 05/10/2015  . BP (high blood pressure) 05/10/2015  . Lumbar radiculopathy 05/10/2015  . Avitaminosis D 05/10/2015  . Benign essential HTN 05/07/2015    Past Medical History:  Diagnosis Date  . Hx of cardiac catheterization   . Hyperlipidemia   . Hypertension     Past Surgical History:  Procedure Laterality Date  . APPENDECTOMY    . CARDIAC CATHETERIZATION    . COLONOSCOPY WITH PROPOFOL N/A 06/24/2018   Procedure: COLONOSCOPY WITH PROPOFOL;  Surgeon: Virgel Manifold, MD;  Location: [facility] END[facility];  Service: Endoscopy;  Laterality: N/A;  . CORONARY ARTERY BYPASS GRAFT     x 3  . ESOPHAGOGASTRODUODENOSCOPY (EGD) WITH PROPOFOL N/A 06/24/2018   Procedure: ESOPHAGOGASTRODUODENOSCOPY (EGD) WITH PROPOFOL;  Surgeon: Virgel Manifold, MD;  Location: [facility];  Service: Endoscopy;  Laterality: N/A;  . HERNIA REPAIR      Social History   Socioeconomic History  . Marital status: Single    Spouse name: Not on file  . Number of children: Not on file  . Years of education: Not on file  . Highest education level: Not on file  Occupational History  . Not on file  Social Needs  . Financial resource strain: Not on file  . Food insecurity:    Worry: Not on file    Inability: Not on file  . Transportation needs:    Medical: Not on file    Non-medical: Not on file  Tobacco Use  . Smoking status: Never Smoker  . Smokeless tobacco: Never Used  Substance and Sexual Activity  . Alcohol use: Yes    Alcohol/week: 0.0 standard drinks  Comment: glass of wine occasionally  . Drug use: No  . Sexual activity: Not on file  Lifestyle  . Physical activity:    Days per week: Not on file    Minutes per session: Not on file  . Stress: Not on file  Relationships  . Social connections:    Talks on phone: Not on file    Gets together: Not on file    Attends religious service: Not on file    Active member of club  or organization: Not on file    Attends meetings of clubs or organizations: Not on file    Relationship status: Not on file  . Intimate partner violence:    Fear of current or ex partner: Not on file    Emotionally abused: Not on file    Physically abused: Not on file    Forced sexual activity: Not on file  Other Topics Concern  . Not on file  Social History Narrative  . Not on file    Family History  Problem Relation Age of Onset  . Hyperlipidemia Mother   . Diabetes Father   . Hypertension Father   . Thyroid cancer Sister   . Colon cancer Sister     Medications Prior to Admission  Medication Sig Dispense Refill Last Dose  . amLODipine (NORVASC) 5 MG tablet Take 1 tablet (5 mg total) by mouth daily. 90 tablet 3 11/11/2018 at 0730  . aspirin EC 81 MG tablet Take 81 mg by mouth daily.   11/10/2018 at Unknown time  . metoprolol tartrate (LOPRESSOR) 25 MG tablet Take 0.5 tablets (12.5 mg total) by mouth 2 (two) times daily. (Patient taking differently: Take 25 mg by mouth 2 (two) times daily. ) 60 tablet 1 11/11/2018 at 0730  . MULTIPLE VITAMINS PO Take 1 tablet by mouth daily.    11/10/2018 at Unknown time  . rosuvastatin (CRESTOR) 40 MG tablet TAKE 1 TABLET BY MOUTH  DAILY (Patient taking differently: Take 40 mg by mouth daily. ) 90 tablet 3 11/10/2018 at Unknown time  . testosterone (TESTIM) 50 MG/5GM (1%) GEL Place 5 g onto the skin daily. 30 Tube 5 11/10/2018 at Unknown time  . traMADol (ULTRAM) 50 MG tablet Take 2 tablets (100 mg total) by mouth 3 (three) times daily as needed for severe pain. For chronic pain To last for 30 days from fill date. (Patient taking differently: Take 100 mg by mouth 3 (three) times daily as needed for severe pain. ) 180 tablet 3 11/10/2018 at Unknown time  . zolpidem (AMBIEN CR) 12.5 MG CR tablet Take 1 tablet (12.5 mg total) by mouth at bedtime as needed for sleep. Reviewed PMP Aware Alerts and stopped the Zolpidem 10 mg. (Patient taking differently: Take  12.5 mg by mouth at bedtime. ) 30 tablet 5 11/10/2018 at Unknown time    Current Facility-Administered Medications  Medication Dose Route Frequency Provider Last Rate Last Dose  . bupivacaine liposome (EXPAREL) 1.3 % injection 266 mg  20 mL Infiltration On Call to OR Michael Boston, MD      . Chlorhexidine Gluconate Cloth 2 % PADS 6 each  6 each Topical Once Michael Boston, MD      . gentamicin (GARAMYCIN) 370 mg in dextrose 5 % 100 mL IVPB  5 mg/kg Intravenous On Call to OR Michael Boston, MD      . lactated ringers infusion   Intravenous Continuous Roberts Gaudy, MD 50 mL/hr at 11/11/18 1212  Allergies  Allergen Reactions  . Penicillins Itching and Other (See Comments)    Did it involve swelling of the face/tongue/throat, SOB, or low BP? No Did it involve sudden or severe rash/hives, skin peeling, or any reaction on the inside of your mouth or nose? No Did you need to seek medical attention at a hospital or doctor's office? Yes When did it last happen? Childhood allergy If all above answers are "NO", may proceed with cephalosporin use.     BP 130/76   Pulse 71   Temp 98.2 F (36.8 C) (Oral)   Resp 16   Ht 5\' 11"  (1.803 m)   Wt 74.9 kg   SpO2 100%   BMI 23.03 kg/m   Labs: No results found for this or any previous visit (from the past 48 hour(s)).  Imaging / Studies: No results found.   [name], M.D., F.A.C.S. Gastrointestinal and Minimally Invasive Surgery [facility] [address] [place], [place] [address] [phone] Main / Paging  11/11/2018 12:29 PM    [name]

## 2018-11-11 NOTE — Transfer of Care (Signed)
Immediate Anesthesia Transfer of Care Note  Patient: Shawn Crawford  Procedure(s) Performed: HEMORRHOIDAL LIGATION/PEXY, PARTIAL LATERAL SPINCTEROTOMY (N/A )  Patient Location: PACU  Anesthesia Type:General  Level of Consciousness: awake, alert , oriented and patient cooperative  Airway & Oxygen Therapy: Patient Spontanous Breathing and Patient connected to face mask oxygen  Post-op Assessment: Report given to RN, Post -op Vital signs reviewed and stable and Patient moving all extremities  Post vital signs: Reviewed and stable  Last Vitals:  Vitals Value Taken Time  BP 133/82 11/11/2018  2:55 PM  Temp 36.5 C 11/11/2018  2:55 PM  Pulse 79 11/11/2018  2:58 PM  Resp 15 11/11/2018  2:58 PM  SpO2 100 % 11/11/2018  2:58 PM  Vitals shown include unvalidated device data.  Last Pain:  Vitals:   11/11/18 1455  TempSrc:   PainSc: (P) Asleep         Complications: No apparent anesthesia complications

## 2018-11-11 NOTE — Anesthesia Procedure Notes (Signed)
Procedure Name: Intubation Date/Time: 11/11/2018 1:57 PM Performed by: Mitzie Na, CRNA Pre-anesthesia Checklist: Patient identified, Emergency Drugs available, Suction available, Patient being monitored and Timeout performed Patient Re-evaluated:Patient Re-evaluated prior to induction Oxygen Delivery Method: Circle system utilized Preoxygenation: Pre-oxygenation with 100% oxygen Induction Type: IV induction Ventilation: Mask ventilation without difficulty Laryngoscope Size: Mac and 4 Grade View: Grade I Tube type: Oral Tube size: 7.5 mm Number of attempts: 1 Airway Equipment and Method: Stylet Placement Confirmation: ETT inserted through vocal cords under direct vision,  positive ETCO2 and breath sounds checked- equal and bilateral Secured at: 24 cm Tube secured with: Tape Dental Injury: Teeth and Oropharynx as per pre-operative assessment

## 2018-11-11 NOTE — Discharge Instructions (Signed)
ANORECTAL SURGERY:  °POST OPERATIVE INSTRUCTIONS ° °###################################################################### ° °EAT °Start with a pureed / full liquid diet °After 24 hours, gradually transition to a high fiber diet.   ° °CONTROL PAIN °Control pain so you can tolerate bowel movements,  °walk, sleep, tolerate sneezing/coughing, and go up/down stairs. ° ° °HAVE A BOWEL MOVEMENT DAILY °Keep your bowels regular to avoid problems.   °Taking a fiber supplement every day to keep bowels soft.   °Try a laxative to override constipation. °Use an antidairrheal to slow down diarrhea.   °Call if not better after 2 tries ° °WALK °Walk an hour a day.  Control your pain to do that. °  °CALL IF YOU HAVE PROBLEMS/CONCERNS °Call if you are still struggling despite following these instructions. °Call if you have concerns not answered by these instructions ° °###################################################################### ° ° ° °1. Take your usually prescribed home medications unless otherwise directed. °2. DIET: Follow a light bland diet the first 24 hours after arrival home, such as soup, liquids, crackers, etc.  Be sure to include lots of fluids daily.  Avoid fast food or heavy meals as your are more likely to get nauseated.  Eat a low fat the next few days after surgery.   °3. PAIN CONTROL: °a. Pain is best controlled by a usual combination of three different methods TOGETHER: °i. Ice/Heat °ii. Over the counter pain medication °iii. Prescription pain medication °b. Expect swelling and discomfort in the anus/rectal area.  Warm water baths (30-60 minutes up to 6 times a day, especially after bowel meovements) will help. Use ice for the first few days to help decrease swelling and bruising, then switch to heat such as warm towels, sitz baths, warm baths, etc to help relax tight/sore spots and speed recovery.  Some people prefer to use ice alone, heat alone, alternating between ice & heat.  Experiment to what works  for you.   °c. It is helpful to take an over-the-counter pain medication continuously for the first few weeks.  Choose one of the following that works best for you: °i. Naproxen (Aleve, etc)  Two 220mg tabs twice a day °ii. Ibuprofen (Advil, etc) Three 200mg tabs four times a day (every meal & bedtime) °iii. Acetaminophen (Tylenol, etc) 500-650mg four times a day (every meal & bedtime) °d. A  prescription for pain medication (such as oxycodone, hydrocodone, etc) should be given to you upon discharge.  Take your pain medication as prescribed.  °i. If you are having problems/concerns with the prescription medicine (does not control pain, nausea, vomiting, rash, itching, etc), please call us [phone] to see if we need to switch you to a different pain medicine that will work better for you and/or control your side effect better. °ii. If you need a refill on your pain medication, please contact your pharmacy.  They will contact our office to request authorization. Prescriptions will not be filled after 5 pm or on week-ends.  If can take up to 48 hours for it to be filled & ready so avoid waiting until you are down to thel ast pill. °e. A topical cream (Dibucaine) or a prescription for a cream (such as diltiazem 2% gel) may be given to you.  Many people find relief with topical creams.  Some people find it burns too much.  Experiment.  If it helps, use it.  If it burns, don't using it. ° °Use a Sitz Bath 4-8 times a day for relief ° ° °Sitz Bath °A sitz bath   is a warm water bath taken in the sitting position that covers only the hips and buttocks. It may be used for either healing or hygiene purposes. Sitz baths are also used to relieve pain, itching, or muscle spasms. The water may contain medicine. Moist heat will help you heal and relax.  °HOME CARE INSTRUCTIONS  °Take 3 to 4 sitz baths a day. °1. Fill the bathtub half full with warm water. °2. Sit in the water and open the drain a little. °3. Turn on the warm  water to keep the tub half full. Keep the water running constantly. °4. Soak in the water for 15 to 20 minutes. °5. After the sitz bath, pat the affected area dry first. ° ° °4. KEEP YOUR BOWELS REGULAR °a. The goal is one soft bowel movement a day °b. Avoid getting constipated.  Between the surgery and the pain medications, it is common to experience some constipation.  Increasing fluid intake and taking a fiber supplement (such as Metamucil, Citrucel, FiberCon, MiraLax, etc) 2-3 times a day regularly will usually help prevent this problem from occurring.  A mild laxative (prune juice, Milk of Magnesia, MiraLax, etc) should be taken according to package directions if there are no bowel movements after 48 hours. °c. Watch out for diarrhea.  If you have many loose bowel movements, simplify your diet to bland foods & liquids for a few days.  Stop any stool softeners and decrease your fiber supplement.  Switching to mild anti-diarrheal medications (Kayopectate, Pepto Bismol) can help.  Can try an imodium/loperamide dose.  If this worsens or does not improve, please call us. ° °5. Wound Care ° °a. Remove your bandages with your first bowel movement, usually the day after surgery.  You may have packing if you had an abscess.  Let any packing or gauze fall come out.   °b. Wear an absorbent pad or soft cotton balls in your underwear as needed to catch any drainage and help keep the area  °c. Keep the area clean and dry.  Bathe / shower every day.  Keep the area clean by showering / bathing over the incision / wound.   It is okay to soak an open wound to help wash it.  Consider using a squeeze bottle filled with warm water to gently wash the anal area.  Wet wipes or showers / gentle washing after bowel movements is often less traumatic than regular toilet paper. °d. You will often notice bleeding with bowel movements.  This should slow down by the end of the first week of surgery.  Sitting on an ice pack can  help. °e. Expect some drainage.  This should slow down by the end of the first week of surgery, but you will have occasional bleeding or drainage up to a few months after surgery.  Wear an absorbent pad or soft cotton gauze in your underwear until the drainage stops. ° °6. ACTIVITIES as tolerated:   °a. You may resume regular (light) daily activities beginning the next day--such as daily self-care, walking, climbing stairs--gradually increasing activities as tolerated.  If you can walk 30 minutes without difficulty, it is safe to try more intense activity such as jogging, treadmill, bicycling, low-impact aerobics, swimming, etc. °b. Save the most intensive and strenuous activity for last such as sit-ups, heavy lifting, contact sports, etc  Refrain from any heavy lifting or straining until you are off narcotics for pain control.   °c. DO NOT PUSH THROUGH PAIN.  Let pain   be your guide: If it hurts to do something, don't do it.  Pain is your body warning you to avoid that activity for another week until the pain goes down. d. You may drive when you are no longer taking prescription pain medication, you can comfortably sit for long periods of time, and you can safely maneuver your car and apply brakes. e. Dennis Bast may have sexual intercourse when it is comfortable.  7. FOLLOW UP in our office a. Please call [facility] at [phone] to set up an appointment to see your surgeon in the office for a follow-up appointment approximately 2-3 weeks after your surgery. b. Make sure that you call for this appointment the day you arrive home to ensure a convenient appointment time.  8. IF YOU HAVE DISABILITY OR FAMILY LEAVE FORMS, BRING THEM TO THE OFFICE FOR PROCESSING.  DO NOT GIVE THEM TO YOUR DOCTOR.        WHEN TO CALL us [phone]: 1. Poor pain control 2. Reactions / problems with new medications (rash/itching, nausea, etc)  3. Fever over 101.5 F (38.5 C) 4. Inability to urinate 5. Nausea and/or  vomiting 6. Worsening swelling or bruising 7. Continued bleeding from incision. 8. Increased pain, redness, or drainage from the incision  The clinic staff is available to answer your questions during regular business hours (8:30am-5pm).  Please dont hesitate to call and ask to speak to one of our nurses for clinical concerns.   A surgeon from [facility] is always on call at the hospitals   If you have a medical emergency, go to the nearest emergency room or call 911.    [facility], [place], [place], [place], [place]  [address] ? MAIN: [phone] ? TOLL FREE: [phone] ? FAX [phone] [email or web address]     Anal Fissure, Adult  An anal fissure is a small tear or crack in the tissue of the anus. Bleeding from a fissure usually stops on its own within a few minutes. However, bleeding will often occur again with each bowel movement until the fissure heals. What are the causes? This condition is usually caused by passing a large or hard stool (feces). Other causes include:  Constipation.  Frequent diarrhea.  Inflammatory bowel disease (Crohn's disease or ulcerative colitis).  Childbirth.  Infections.  Anal sex. What are the signs or symptoms? Symptoms of this condition include:  Bleeding from the rectum.  Small amounts of blood seen on your stool, on the toilet paper, or in the toilet after a bowel movement. The blood coats the outside of the stool and is not mixed with the stool.  Painful bowel movements.  Itching or irritation around the anus. How is this diagnosed? A health care provider may diagnose this condition by closely examining the anal area. An anal fissure can usually be seen with careful inspection. In some cases, a rectal exam may be performed, or a short tube (anoscope) may be used to examine the anal canal. How is this treated? Initial treatment for this condition may  include:  Taking steps to avoid constipation. This may include making changes to your diet, such as increasing your intake of fiber or fluid.  Taking fiber supplements. These supplements can soften your stool to help make bowel movements easier. Your health care provider may also prescribe a stool softener if your stool is hard.  Taking sitz baths. This may help to heal the tear.  Using medicated creams or ointments. These  may be prescribed to lessen discomfort. Treatments that are sometimes used if initial treatments do not work well or if the condition is more severe may include:  Botulinum injection.  Surgery to repair the fissure. Follow these instructions at home: Eating and drinking   Avoid foods that may cause constipation, such as bananas, milk, and other dairy products.  Eat all fruits, except bananas.  Drink enough fluid to keep your urine pale yellow.  Eat foods that are high in fiber, such as beans, whole grains, and fresh fruits and vegetables. General instructions   Take over-the-counter and prescription medicines only as told by your health care provider.  Use creams or ointments only as told by your health care provider.  Keep the anal area clean and dry.  Take sitz baths as told by your health care provider. Do not use soap in the sitz baths.  Keep all follow-up visits as told by your health care provider. This is important. Contact a health care provider if you have:  More bleeding.  A fever.  Diarrhea that is mixed with blood.  Pain that continues.  Ongoing problems that are getting worse rather than better. Summary  An anal fissure is a small tear or crack in the tissue of the anus. This condition is usually caused by passing a large or hard stool (feces). Other causes include constipation and frequent diarrhea.  Initial treatment for this condition may include taking steps to avoid constipation, such as increasing your intake of fiber or  fluid.  Follow instructions for care as told by your health care provider.  Contact your health care provider if you have more bleeding or your problem is getting worse rather than better.  Keep all follow-up visits as told by your health care provider. This is important. This information is not intended to replace advice given to you by your health care provider. Make sure you discuss any questions you have with your health care provider. Document Released: 09/15/2005 Document Revised: 02/25/2018 Document Reviewed: 02/25/2018 Elsevier Interactive Patient Education  2019 [employer].

## 2018-11-11 NOTE — Op Note (Signed)
11/11/2018 2:58 PM   2:37 PM  PATIENT:  Shawn Crawford  58 y.o. male  Patient Care Team: Jerrol Banana., MD as PCP - General (Family Medicine) Michael Boston, MD as Consulting Physician (General Surgery) Corey Skains, MD as Consulting Physician (Cardiology) Virgel Manifold, MD as Consulting Physician (Gastroenterology)  PRE-OPERATIVE DIAGNOSIS:  ANAL FISSURE  POST-OPERATIVE DIAGNOSIS:   ANAL FISSURE GRADE 2 INTERNAL HEMORRHOID  PROCEDURE:   LATERAL INTERNAL PARTIAL ANAL SPINCTEROTOMY HEMORRHOIDAL LIGATION/PEXY  SURGEON:  [name], MD  ASSISTANT: OR Staff   ANESTHESIA:   General Anorectal & Local field block (0.25% bupivacaine with epinephrine mixed with Liposomal bupivacaine (Experel)   EBL:  Total I/O In: 1000 [I.V.:1000] Out: -   Delay start of Pharmacological VTE agent (>24hrs) due to surgical blood loss or risk of bleeding:  no  DRAINS: none   SPECIMEN:  POSTERIOR ANAL FISSURE  DISPOSITION OF SPECIMEN:  PATHOLOGY  COUNTS:  YES  PLAN OF CARE: Discharge to home after PACU  PATIENT DISPOSITION:  PACU - hemodynamically stable.  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  I recommended examination and surgical treatment:  The anatomy & physiology of the anorectal region was discussed.  The pathophysiology of anal fissure and differential diagnosis was discussed.  Natural history progression  was discussed.   I stressed the importance of a bowel regimen to have daily soft bowel movements to minimize progression of disease.     The patient's condition is not adequately controlled.  Non-operative treatment has not healed the fissure.  Therefore, I recommended examination under anesthesia for better examination to confirm the diagnosis and treat by lateral internal sphincterotomy to relax the spasm better & allow the fissure to heal.  Technique, benefits, alternatives were  discussed.   I noted a good likelihood this will help address the problem.  Risks such as bleeding, pain, incontinence, recurrence, heart attack, death, and other risks were discussed.    Educational handouts further explaining the pathology, treatment options, and bowel regimen were given as well.  The patient expressed understanding & wishes to proceed with surgery.  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 Sphincterotomy location:  Left lateral anal canal.  70% distal internal sphincterotomy performed  Grade 2 right posterior internal hemorrhoid nearby.  Ligation and pexy done.  Right anterior hemorrhoidal piles normal and left alone.  IDESCRIPTION:   Informed consent was confirmed. Patient underwent general anesthesia without difficulty. Patient was placed into prone positioning.  The perianal region was prepped and draped in sterile fashion. Surgical timeout confirmed or plan.  I did digital rectal examination and then transitioned over to anoscopy to get a sense of the anatomy.  I identified an anal fissure in the posterior midline anal canal.  The sphincter tone was increased.  No stricture.  No abscess located.  No fistula   I went ahead and proceeded with internal sphinterotmy technique.  I chose the left lateral pile.  I took a 2-0 Vicryl suture and did a figure-of-eight stitch 5 cm proximal to the sphincter complex.  I then ran that down longitudinally.  I then excised through the anoderm of the left lateral anal canal longitudinally.  I identified the internal and external sphincters.  Elevating the internal sphincter, I proceeded with a partial internal sphincterotomy starting distally and moving proximally using cautery.  Because his fissure was rather proximal of moderate size, this involved  the distal 70% thickness.  This provided improved relaxation of the anal sphincter.  I closed the anal canal wound vertically continuing a  2-0 Vicryl suture to good result, leaving a 5 mm distal opening to allow drainage.  His right posterior hemorrhoidal pile was somewhat large grade 2 sided another 2-0 Vicryl running suture to do a ligation and pexy there.  The right anterior hemorrhoidal pile was fine so I left it alone.    I reexamined the anal canal.   There is was no narrowing.  Hemostasis was excellent.  I repeated anoscopy and examination.  Hemostasis was good.  Patient is being extubated go to recovery room.  I discussed operative findings, updated the patient's status, discussed probable steps to recovery, and gave postoperative recommendations to the patient's sister.  Recommendations were made.  Questions were answered.  She expressed understanding & appreciation.   [name], M.D., F.A.C.S. Gastrointestinal and Minimally Invasive Surgery [facility] [address] [place], [place] [id number] [phone] Main / Paging

## 2018-11-11 NOTE — Anesthesia Postprocedure Evaluation (Signed)
Anesthesia Post Note  Patient: [name]  Procedure(s) Performed: HEMORRHOIDAL LIGATION/PEXY, PARTIAL LATERAL SPINCTEROTOMY (N/A )     Patient location during evaluation: PACU Anesthesia Type: General Level of consciousness: awake and alert, patient cooperative and oriented Pain management: pain level controlled Vital Signs Assessment: post-procedure vital signs reviewed and stable Respiratory status: spontaneous breathing, nonlabored ventilation and respiratory function stable Cardiovascular status: blood pressure returned to baseline and stable Postop Assessment: no apparent nausea or vomiting Anesthetic complications: no    Last Vitals:  Vitals:   11/11/18 1530 11/11/18 1540  BP: 125/72 121/74  Pulse: 77 72  Resp: 14 16  Temp:  36.9 C  SpO2: 99% 99%    Last Pain:  Vitals:   11/11/18 1540  TempSrc:   PainSc: 3                  Leana Springston,E. Zenon Leaf

## 2018-11-12 ENCOUNTER — Encounter (HOSPITAL_COMMUNITY): Payer: Self-pay | Admitting: Surgery

## 2018-11-18 ENCOUNTER — Other Ambulatory Visit: Payer: Self-pay

## 2018-11-18 ENCOUNTER — Encounter: Payer: Self-pay | Admitting: Student in an Organized Health Care Education/Training Program

## 2018-11-18 ENCOUNTER — Ambulatory Visit
Payer: 59 | Attending: Student in an Organized Health Care Education/Training Program | Admitting: Student in an Organized Health Care Education/Training Program

## 2018-11-18 VITALS — BP 125/76 | HR 74 | Temp 98.6°F | Resp 16 | Ht 71.0 in | Wt 162.0 lb

## 2018-11-18 DIAGNOSIS — M7918 Myalgia, other site: Secondary | ICD-10-CM | POA: Diagnosis present

## 2018-11-18 DIAGNOSIS — M47816 Spondylosis without myelopathy or radiculopathy, lumbar region: Secondary | ICD-10-CM

## 2018-11-18 DIAGNOSIS — G8929 Other chronic pain: Secondary | ICD-10-CM

## 2018-11-18 DIAGNOSIS — M5416 Radiculopathy, lumbar region: Secondary | ICD-10-CM

## 2018-11-18 DIAGNOSIS — M5136 Other intervertebral disc degeneration, lumbar region: Secondary | ICD-10-CM | POA: Insufficient documentation

## 2018-11-18 DIAGNOSIS — G894 Chronic pain syndrome: Secondary | ICD-10-CM | POA: Diagnosis present

## 2018-11-18 DIAGNOSIS — M545 Low back pain: Secondary | ICD-10-CM | POA: Diagnosis not present

## 2018-11-18 MED ORDER — BUPRENORPHINE 10 MCG/HR TD PTWK: 1.0000 | MEDICATED_PATCH | TRANSDERMAL | 0 refills | Status: DC

## 2018-11-18 NOTE — Patient Instructions (Signed)
Butrans patches have been escribed to your pharmacy.

## 2018-11-18 NOTE — Progress Notes (Signed)
Patient's Name: Shawn Crawford  MRN: [id number]  Referring Provider: Jerrol Banana.,*  DOB: Nov 04, 1960  PCP: Jerrol Banana., MD  DOS: 11/18/2018  Note by: Gillis Santa, MD  Service setting: Ambulatory outpatient  Specialty: Interventional Pain Management  Location: [facility] (AMB) Pain Management Facility    Patient type: Established   Primary Reason(s) for Visit: Encounter for prescription drug management. (Level of risk: moderate)  CC: Back Pain (bilateral lower)  HPI  Shawn Crawford is a 58 y.o. year old, male patient, who comes today for a medication management evaluation. He has Abnormal blood sugar; Arteriosclerosis of coronary artery; ED (erectile dysfunction) of organic origin; Acid reflux; HLD (hyperlipidemia); BP (high blood pressure); Lumbar radiculopathy; Avitaminosis D; Hyperglycemia; Benign essential HTN; Chronic pain syndrome; Chronic midline low back pain without sciatica; Lumbar facet arthropathy; Lumbar degenerative disc disease; Piriformis muscle pain; Special screening for malignant neoplasms, colon; Benign neoplasm of descending colon; Diverticulosis of large intestine without diverticulitis; Anal fissure s/p partial internal anal sphincterotomy 11/11/2018; Melanosis of colon; and Internal hemorrhoids on their problem list. His primarily concern today is the Back Pain (bilateral lower)  Pain Assessment: Location: Right, Left Back Radiating: both legs to just above the knees Onset: More than a month ago Duration: Chronic pain Quality: Aching Severity: 3 /10 (subjective, self-reported pain score)  Note: Reported level is compatible with observation.                         When using our objective Pain Scale, levels between 6 and 10/10 are said to belong in an emergency room, as it progressively worsens from a 6/10, described as severely limiting, requiring emergency care not usually available at an outpatient pain management facility. At a 6/10 level, communication becomes  difficult and requires great effort. Assistance to reach the emergency department may be required. Facial flushing and profuse sweating along with potentially dangerous increases in heart rate and blood pressure will be evident. Effect on ADL:   Timing: Constant Modifying factors: lying on side with pillow between legs BP: 125/76  HR: 74  Shawn Crawford was last scheduled for an appointment on 08/03/2018 for medication management. During today's appointment we reviewed Shawn Crawford chronic pain status, as well as his outpatient medication regimen.  Patient follows up today for medication management is status post hemorrhoid surgery on 11/11/2018 which he is recovering from.  He states that this is very painful.  Patient has been utilizing oxycodone that was prescribed by Dr. gross his surgeon.  He is here to discuss his tramadol regimen.  He states that he would like to seek out an alternative as he noticed increased neck muscle tension with tramadol.  He states that this muscle tension has resolved since he is now taking oxycodone.  He is open to retrying Nucynta.  The patient  reports no history of drug use. His body mass index is 22.59 kg/m.  Further details on both, my assessment(s), as well as the proposed treatment plan, please see below.  Controlled Substance Pharmacotherapy Assessment REMS (Risk Evaluation and Mitigation Strategy)   11/01/2018  3   08/03/2018  Tramadol Hcl 50 MG Tablet  180.00 30 Bi Lat   [id number]   Thr (4878)   3  30.00 MME  Comm Ins   [place]     Landis Martins, RN  11/18/2018  9:52 AM  Sign when Signing Visit Nursing Pain Medication Assessment:  Safety precautions to be maintained throughout  the outpatient stay will include: orient to surroundings, keep bed in low position, maintain call bell within reach at all times, provide assistance with transfer out of bed and ambulation.  Medication Inspection Compliance: Pill count conducted under aseptic conditions, in front of the  patient. Neither the pills nor the bottle was removed from the patient's sight at any time. Once count was completed pills were immediately returned to the patient in their original bottle.  Medication: Tramadol (Ultram) Pill/Patch Count: 100 of 180 pills remain Pill/Patch Appearance: Markings consistent with prescribed medication Bottle Appearance: Standard pharmacy container. Clearly labeled. Filled Date: 02/03/ 2020 Last Medication intake:  1 week ago Pharmacokinetics: Liberation and absorption (onset of action): WNL Distribution (time to peak effect): WNL Metabolism and excretion (duration of action): WNL         Pharmacodynamics: Desired effects: Analgesia: Shawn Crawford reports 50% benefit. Functional ability: Patient reports that medication allows him to accomplish basic ADLs Clinically meaningful improvement in function (CMIF): Sustained CMIF goals met Perceived effectiveness: Described as relatively effective but with some room for improvement Undesirable effects: Side-effects or Adverse reactions: None reported Monitoring: [place] PMP: Online review of the past 67-monthperiod conducted. Compliant with practice rules and regulations Last UDS on record: Summary  Date Value Ref Range Status  08/03/2018 FINAL  Final    Comment:    ==================================================================== TOXASSURE SELECT 13 (MW) ==================================================================== Test                             Result       Flag       Units Drug Present and Declared for Prescription Verification   Tramadol                       >2174        EXPECTED   ng/mg creat   O-Desmethyltramadol            >2174        EXPECTED   ng/mg creat   N-Desmethyltramadol            1101         EXPECTED   ng/mg creat    Source of tramadol is a prescription medication.    O-desmethyltramadol and N-desmethyltramadol are expected    metabolites of tramadol. Drug Present not Declared for  Prescription Verification   Tapentadol                     191          UNEXPECTED ng/mg creat    Source of tapentadol is a scheduled prescription medication. ==================================================================== Test                      Result    Flag   Units      Ref Range   Creatinine              230              mg/dL      >=20 ==================================================================== Declared Medications:  The flagging and interpretation on this report are based on the  following declared medications.  Unexpected results may arise from  inaccuracies in the declared medications.  **Note: The testing scope of this panel includes these medications:  Tramadol (Ultram)  **Note: The testing scope of this panel does not include following  reported medications:  Amlodipine (Norvasc)  Aspirin (Aspirin 81)  Fexofenadine (Allegra)  Hydrocortisone (Anusol HC)  Metoprolol (Lopressor)  Multivitamin  Omega-3 Fatty Acids (Fish Oil)  Rosuvastatin (Crestor)  Ubiquinone (CoQ10)  Zolpidem (Ambien) ==================================================================== For clinical consultation, please call [phone]. ====================================================================    UDS interpretation: Compliant          Medication Assessment Form: Reviewed. Patient indicates being compliant with therapy Treatment compliance: Compliant Risk Assessment Profile: Aberrant behavior: See initial evaluations. None observed or detected today Comorbid factors increasing risk of overdose: See initial evaluation. No additional risks detected today Opioid risk tool (ORT):  Opioid Risk  08/03/2018  Alcohol 0  Illegal Drugs 0  Rx Drugs 0  Alcohol 0  Illegal Drugs 0  Rx Drugs 0  Age between 16-45 years  0  History of Preadolescent Sexual Abuse 0  Psychological Disease 0  Depression 0  Opioid Risk Tool Scoring 0  Opioid Risk Interpretation Low Risk    ORT  Scoring interpretation table:  Score <3 = Low Risk for SUD  Score between 4-7 = Moderate Risk for SUD  Score >8 = High Risk for Opioid Abuse   Risk of substance use disorder (SUD): Low  Risk Mitigation Strategies:  Patient Counseling: Covered Patient-Prescriber Agreement (PPA): Present and active  Notification to other healthcare providers: Done  Pharmacologic Plan: Discussed alternatives to tramadol.  Discussed Nucynta as well as buprenorphine.  We will try buprenorphine.             Laboratory Chemistry  Inflammation Markers (CRP: Acute Phase) (ESR: Chronic Phase) Lab Results  Component Value Date   ESRSEDRATE 2 08/06/2018                         Rheumatology Markers Lab Results  Component Value Date   ANA Negative 08/06/2018   LABURIC 7.5 07/08/2017   HLAB27 Negative 05/12/2017                        Renal Function Markers Lab Results  Component Value Date   BUN 16 11/08/2018   CREATININE 1.11 11/08/2018   BCR 11 08/06/2018   GFRAA >60 11/08/2018   GFRNONAA >60 11/08/2018                             Hepatic Function Markers Lab Results  Component Value Date   AST 33 08/06/2018   ALT 40 08/06/2018   ALBUMIN 5.2 08/06/2018   ALKPHOS 69 08/06/2018                        Electrolytes Lab Results  Component Value Date   NA 142 11/08/2018   K 4.3 11/08/2018   CL 105 11/08/2018   CALCIUM 9.3 11/08/2018                        Neuropathy Markers Lab Results  Component Value Date   HGBA1C 5.8 (H) 02/12/2017                        CNS Tests No results found for: COLORCSF, APPEARCSF, RBCCOUNTCSF, WBCCSF, POLYSCSF, LYMPHSCSF, EOSCSF, PROTEINCSF, GLUCCSF, JCVIRUS, CSFOLI, IGGCSF                      Bone Pathology Markers Lab Results  Component Value Date   TESTOFREE 4.6 (L) 11/25/2017  TESTOSTERONE 226 (L) 08/06/2018                         Coagulation Parameters Lab Results  Component Value Date   PLT 172 11/08/2018                         Cardiovascular Markers Lab Results  Component Value Date   CKTOTAL 75 08/06/2018   HGB 13.5 11/08/2018   HCT 40.7 11/08/2018                         CA Markers No results found for: CEA, CA125, LABCA2                      Endocrine Markers Lab Results  Component Value Date   TSH 1.660 04/22/2018   TESTOFREE 4.6 (L) 11/25/2017   TESTOSTERONE 226 (L) 08/06/2018                        Note: Lab results reviewed.  Recent Diagnostic Imaging Results  CT Abdomen Pelvis W Contrast CLINICAL DATA:  58 year old male with a history of unexplained weight loss  EXAM: CT CHEST, ABDOMEN, AND PELVIS WITH CONTRAST  TECHNIQUE: Multidetector CT imaging of the chest, abdomen and pelvis was performed following the standard protocol during bolus administration of intravenous contrast.  CONTRAST:  123m OMNIPAQUE IOHEXOL 300 MG/ML  SOLN  COMPARISON:  None.  FINDINGS: CT CHEST FINDINGS  Cardiovascular:  Heart:  Surgical changes of median sternotomy and CABG. Native coronary calcifications  Aorta:  Unremarkable course, caliber, contour of the thoracic aorta. No aneurysm or dissection flap. No periaortic fluid. Calcifications of the aortic arch. Branch vessels are patent. Descending thoracic aorta with mild atherosclerotic changes.  Pulmonary arteries:  Study is not tailored for the most sensitive evaluation of the pulmonary arteries, however, no central filling defects identified.  Mediastinum/Nodes: Small lymph nodes of the mediastinum. Calcified lymph nodes in the subcarinal and left hilar nodal stations. Unremarkable appearance of the thoracic esophagus.  Unremarkable appearance of the thoracic inlet.  Lungs/Pleura: Central airways are Crawford. No pleural effusion. No confluent airspace disease.  No pneumothorax.  Musculoskeletal: No acute displaced fracture. Degenerative changes of the spine.  Review of the MIP images confirms the above findings.  CT ABDOMEN  PELVIS FINDINGS  Hepatobiliary: Unremarkable appearance of liver. Unremarkable gallbladder  Pancreas: Unremarkable pancreas  Spleen: Punctate calcifications throughout the spleen, compatible with prior granulomatous disease.  Adrenals/Urinary Tract: Unremarkable appearance of the adrenal glands. No evidence of hydronephrosis of the right or left kidney. No nephrolithiasis. Unremarkable course of the bilateral ureters. Unremarkable appearance of the urinary bladder.  Stomach/Bowel: Unremarkable stomach. Unremarkable small bowel. Appendectomy. Moderate stool burden. Colonic diverticular disease without evidence of acute inflammation.  Vascular/Lymphatic: Atherosclerotic changes of the abdominal aorta. Mesenteric arteries and bilateral renal arteries are patent. Bilateral iliac arteries and proximal femoral arteries patent.  Reproductive: Diameter of the prostate measures 4.4 cm  Other: None  Musculoskeletal: No acute displaced fracture.  Degenerative changes.  IMPRESSION: No acute CT finding.  No finding to account for weight loss.  Surgical changes of median sternotomy and CABG.  Aortic Atherosclerosis (ICD10-I70.0).  Diverticular disease without evidence of acute diverticulitis.  Electronically Signed   By: JCorrie MckusickD.O.   On: 05/06/2018 09:57 CT Chest W Contrast CLINICAL DATA:  58year old male with a history of unexplained  weight loss  EXAM: CT CHEST, ABDOMEN, AND PELVIS WITH CONTRAST  TECHNIQUE: Multidetector CT imaging of the chest, abdomen and pelvis was performed following the standard protocol during bolus administration of intravenous contrast.  CONTRAST:  177m OMNIPAQUE IOHEXOL 300 MG/ML  SOLN  COMPARISON:  None.  FINDINGS: CT CHEST FINDINGS  Cardiovascular:  Heart:  Surgical changes of median sternotomy and CABG. Native coronary calcifications  Aorta:  Unremarkable course, caliber, contour of the thoracic aorta. No aneurysm or  dissection flap. No periaortic fluid. Calcifications of the aortic arch. Branch vessels are patent. Descending thoracic aorta with mild atherosclerotic changes.  Pulmonary arteries:  Study is not tailored for the most sensitive evaluation of the pulmonary arteries, however, no central filling defects identified.  Mediastinum/Nodes: Small lymph nodes of the mediastinum. Calcified lymph nodes in the subcarinal and left hilar nodal stations. Unremarkable appearance of the thoracic esophagus.  Unremarkable appearance of the thoracic inlet.  Lungs/Pleura: Central airways are Crawford. No pleural effusion. No confluent airspace disease.  No pneumothorax.  Musculoskeletal: No acute displaced fracture. Degenerative changes of the spine.  Review of the MIP images confirms the above findings.  CT ABDOMEN PELVIS FINDINGS  Hepatobiliary: Unremarkable appearance of liver. Unremarkable gallbladder  Pancreas: Unremarkable pancreas  Spleen: Punctate calcifications throughout the spleen, compatible with prior granulomatous disease.  Adrenals/Urinary Tract: Unremarkable appearance of the adrenal glands. No evidence of hydronephrosis of the right or left kidney. No nephrolithiasis. Unremarkable course of the bilateral ureters. Unremarkable appearance of the urinary bladder.  Stomach/Bowel: Unremarkable stomach. Unremarkable small bowel. Appendectomy. Moderate stool burden. Colonic diverticular disease without evidence of acute inflammation.  Vascular/Lymphatic: Atherosclerotic changes of the abdominal aorta. Mesenteric arteries and bilateral renal arteries are patent. Bilateral iliac arteries and proximal femoral arteries patent.  Reproductive: Diameter of the prostate measures 4.4 cm  Other: None  Musculoskeletal: No acute displaced fracture.  Degenerative changes.  IMPRESSION: No acute CT finding.  No finding to account for weight loss.  Surgical changes of median sternotomy  and CABG.  Aortic Atherosclerosis (ICD10-I70.0).  Diverticular disease without evidence of acute diverticulitis.  Electronically Signed   By: JCorrie MckusickD.O.   On: 05/06/2018 09:57  Complexity Note: Imaging results reviewed. Results shared with Mr. DClear using Layman's terms.                         Meds   Current Outpatient Medications:  .  amLODipine (NORVASC) 5 MG tablet, Take 1 tablet (5 mg total) by mouth daily., Disp: 90 tablet, Rfl: 3 .  aspirin EC 81 MG tablet, Take 81 mg by mouth daily., Disp: , Rfl:  .  gabapentin (NEURONTIN) 300 MG capsule, Take 1 capsule (300 mg total) by mouth 2 (two) times daily. Increase to 4x/day as needed, Disp: 60 capsule, Rfl: 1 .  metoprolol tartrate (LOPRESSOR) 25 MG tablet, Take 0.5 tablets (12.5 mg total) by mouth 2 (two) times daily. (Patient taking differently: Take 25 mg by mouth 2 (two) times daily. ), Disp: 60 tablet, Rfl: 1 .  MULTIPLE VITAMINS PO, Take 1 tablet by mouth daily. , Disp: , Rfl:  .  oxyCODONE (OXY IR/ROXICODONE) 5 MG immediate release tablet, Take 1-2 tablets (5-10 mg total) by mouth every 6 (six) hours as needed for moderate pain, severe pain or breakthrough pain., Disp: 30 tablet, Rfl: 0 .  rosuvastatin (CRESTOR) 40 MG tablet, TAKE 1 TABLET BY MOUTH  DAILY, Disp: 90 tablet, Rfl: 3 .  traMADol (ULTRAM) 50 MG  tablet, Take by mouth 3 (three) times daily as needed. 2 tabs, Disp: , Rfl:  .  zolpidem (AMBIEN CR) 12.5 MG CR tablet, Take 1 tablet (12.5 mg total) by mouth at bedtime as needed for sleep. Reviewed PMP Aware Alerts and stopped the Zolpidem 10 mg. (Patient taking differently: Take 12.5 mg by mouth at bedtime. ), Disp: 30 tablet, Rfl: 5 .  buprenorphine (BUTRANS) 10 MCG/HR PTWK patch, Place 1 patch onto the skin every 7 (seven) days., Disp: 4 patch, Rfl: 0 .  testosterone (TESTIM) 50 MG/5GM (1%) GEL, Place 5 g onto the skin daily., Disp: 30 Tube, Rfl: 5  ROS  Constitutional: Denies any fever or chills Gastrointestinal:  No reported hemesis, hematochezia, vomiting, or acute GI distress Musculoskeletal: Denies any acute onset joint swelling, redness, loss of ROM, or weakness Neurological: No reported episodes of acute onset apraxia, aphasia, dysarthria, agnosia, amnesia, paralysis, loss of coordination, or loss of consciousness  Allergies  Shawn Crawford is allergic to penicillins.  [place]  Drug: Shawn Crawford  reports no history of drug use. Alcohol:  reports current alcohol use. Tobacco:  reports that he has never smoked. He has never used smokeless tobacco. Medical:  has a past medical history of cardiac catheterization, Hyperlipidemia, and Hypertension. Surgical: Shawn Crawford  has a past surgical history that includes Coronary artery bypass graft; Hernia repair; Appendectomy; Cardiac catheterization; Colonoscopy with propofol (N/A, 06/24/2018); Esophagogastroduodenoscopy (egd) with propofol (N/A, 06/24/2018); and Exam under anesthesia with hemorrhoidectomy (N/A, 11/11/2018). Family: family history includes Colon cancer in his sister; Diabetes in his father; Hyperlipidemia in his mother; Hypertension in his father; Thyroid cancer in his sister.  Constitutional Exam  General appearance: Well nourished, well developed, and well hydrated. In no apparent acute distress Vitals:   11/18/18 0943  BP: 125/76  Pulse: 74  Resp: 16  Temp: 98.6 F (37 C)  TempSrc: Oral  SpO2: 100%  Weight: 162 lb (73.5 kg)  Height: '5\' 11"'$  (1.803 m)   BMI Assessment: Estimated body mass index is 22.59 kg/m as calculated from the following:   Height as of this encounter: '5\' 11"'$  (1.803 m).   Weight as of this encounter: 162 lb (73.5 kg).  BMI interpretation table: BMI level Category Range association with higher incidence of chronic pain  <18 kg/m2 Underweight   18.5-24.9 kg/m2 Ideal body weight   25-29.9 kg/m2 Overweight Increased incidence by 20%  30-34.9 kg/m2 Obese (Class I) Increased incidence by 68%  35-39.9 kg/m2 Severe obesity (Class  II) Increased incidence by 136%  >40 kg/m2 Extreme obesity (Class III) Increased incidence by 254%   Patient's current BMI Ideal Body weight  Body mass index is 22.59 kg/m. Ideal body weight: 75.3 kg (166 lb 0.1 oz)   BMI Readings from Last 4 Encounters:  11/18/18 22.59 kg/m  11/11/18 23.03 kg/m  11/08/18 23.03 kg/m  09/14/18 23.74 kg/m   Wt Readings from Last 4 Encounters:  11/18/18 162 lb (73.5 kg)  11/11/18 165 lb 2 oz (74.9 kg)  11/08/18 165 lb 2 oz (74.9 kg)  09/14/18 170 lb 3.2 oz (77.2 kg)  Psych/Mental status: Alert, oriented x 3 (person, place, & time)       Eyes: PERLA Respiratory: No evidence of acute respiratory distress  Cervical Spine Area Exam  Skin & Axial Inspection: No masses, redness, edema, swelling, or associated skin lesions Alignment: Symmetrical Functional ROM: Unrestricted ROM      Stability: No instability detected Muscle Tone/Strength: Functionally intact. No obvious neuro-muscular anomalies detected. Sensory (Neurological):  Unimpaired Palpation: No palpable anomalies              Upper Extremity (UE) Exam    Side: Right upper extremity  Side: Left upper extremity  Skin & Extremity Inspection: Skin color, temperature, and hair growth are WNL. No peripheral edema or cyanosis. No masses, redness, swelling, asymmetry, or associated skin lesions. No contractures.  Skin & Extremity Inspection: Skin color, temperature, and hair growth are WNL. No peripheral edema or cyanosis. No masses, redness, swelling, asymmetry, or associated skin lesions. No contractures.  Functional ROM: Unrestricted ROM          Functional ROM: Unrestricted ROM          Muscle Tone/Strength: Functionally intact. No obvious neuro-muscular anomalies detected.  Muscle Tone/Strength: Functionally intact. No obvious neuro-muscular anomalies detected.  Sensory (Neurological): Unimpaired          Sensory (Neurological): Unimpaired          Palpation: No palpable anomalies               Palpation: No palpable anomalies              Provocative Test(s):  Phalen's test: deferred Tinel's test: deferred Apley's scratch test (touch opposite shoulder):  Action 1 (Across chest): deferred Action 2 (Overhead): deferred Action 3 (LB reach): deferred   Provocative Test(s):  Phalen's test: deferred Tinel's test: deferred Apley's scratch test (touch opposite shoulder):  Action 1 (Across chest): deferred Action 2 (Overhead): deferred Action 3 (LB reach): deferred    Thoracic Spine Area Exam  Skin & Axial Inspection: No masses, redness, or swelling Alignment: Symmetrical Functional ROM: Unrestricted ROM Stability: No instability detected Muscle Tone/Strength: Functionally intact. No obvious neuro-muscular anomalies detected. Sensory (Neurological): Unimpaired Muscle strength & Tone: No palpable anomalies  Lumbar Spine Area Exam  Skin & Axial Inspection: No masses, redness, or swelling Alignment: Symmetrical Functional ROM: Unrestricted ROM       Stability: No instability detected Muscle Tone/Strength: Functionally intact. No obvious neuro-muscular anomalies detected. Sensory (Neurological): Unimpaired Palpation: No palpable anomalies       Provocative Tests: Hyperextension/rotation test: deferred today       Lumbar quadrant test (Kemp's test): deferred today       Lateral bending test: deferred today       Patrick's Maneuver: deferred today                   FABER* test: deferred today                   S-I anterior distraction/compression test: deferred today         S-I lateral compression test: deferred today         S-I Thigh-thrust test: deferred today         S-I Gaenslen's test: deferred today         *(Flexion, ABduction and External Rotation)  Gait & Posture Assessment  Ambulation: Unassisted Gait: Relatively normal for age and body habitus Posture: WNL   Lower Extremity Exam    Side: Right lower extremity  Side: Left lower extremity  Stability: No  instability observed          Stability: No instability observed          Skin & Extremity Inspection: Skin color, temperature, and hair growth are WNL. No peripheral edema or cyanosis. No masses, redness, swelling, asymmetry, or associated skin lesions. No contractures.  Skin & Extremity Inspection: Skin color, temperature, and hair growth  are WNL. No peripheral edema or cyanosis. No masses, redness, swelling, asymmetry, or associated skin lesions. No contractures.  Functional ROM: Unrestricted ROM                  Functional ROM: Unrestricted ROM                  Muscle Tone/Strength: Functionally intact. No obvious neuro-muscular anomalies detected.  Muscle Tone/Strength: Functionally intact. No obvious neuro-muscular anomalies detected.  Sensory (Neurological): Unimpaired        Sensory (Neurological): Unimpaired        DTR: Patellar: deferred today Achilles: deferred today Plantar: deferred today  DTR: Patellar: deferred today Achilles: deferred today Plantar: deferred today  Palpation: No palpable anomalies  Palpation: No palpable anomalies   Assessment   Status Diagnosis  Controlled Controlled Controlled 1. Lumbar radiculopathy   2. Chronic midline low back pain without sciatica   3. Lumbar facet arthropathy   4. Chronic pain syndrome   5. Lumbar degenerative disc disease   6. Piriformis muscle pain      Follows up today status post hemorrhoid surgery last week.  Is here for medication refill.  Would like to consider an alternative to tramadol as it is resulting in increased muscle tension in his neck.  We discussed re-trialing Nucynta which the patient was on before.  Can consider Nucynta 75 mg twice daily as needed.  We also discussed buprenorphine therapy.  Patient elected to try buprenorphine.  We will start with buprenorphine 10 mcg patch q. 7 days.  Patient will follow-up in 1 month; at that time we will see how patient is doing on his current regimen.  Plan of Care   Pharmacotherapy (Medications Ordered): Meds ordered this encounter  Medications  . buprenorphine (BUTRANS) 10 MCG/HR PTWK patch    Sig: Place 1 patch onto the skin every 7 (seven) days.    Dispense:  4 patch    Refill:  0    Do not place this medication, or any other prescription from our practice, on "Automatic Refill". Patient may have prescription filled one day early if pharmacy is closed on scheduled refill date. :   Interventional history: Has tried 2 series of lumbar facet medial branch nerve blocks which were not effective.  Patient's lumbar MRI significant for lumbar spondylosis, facet arthropathy, L3-L4 disc bulge.  Has also tried physical therapy not helpful.  Provider-requested follow-up: Return in about 4 weeks (around 12/16/2018) for Medication Management.  Time Note: Greater than 50% of the 25 minute(s) of face-to-face time spent with Shawn Crawford, was spent in counseling/coordination of care regarding: Shawn Crawford primary cause of pain, the treatment plan, treatment alternatives, the risks and possible complications of proposed treatment, medication side effects, going over the informed consent, the opioid analgesic risks and possible complications, the appropriate use of his medications, realistic expectations and the medication agreement.  Future Appointments  Date Time Provider Department Center  11/24/2018  2:20 PM Jerrol Banana., MD [facility] None  12/09/2018  9:45 AM Gillis Santa, MD [facility] None    Primary Care Physician: Jerrol Banana., MD Location: [facility] Note by: Gillis Santa, M.D Date: 11/18/2018; Time: 11:15 AM  Patient Instructions  Butrans patches have been escribed to your pharmacy.

## 2018-11-18 NOTE — Progress Notes (Signed)
Nursing Pain Medication Assessment:  Safety precautions to be maintained throughout the outpatient stay will include: orient to surroundings, keep bed in low position, maintain call bell within reach at all times, provide assistance with transfer out of bed and ambulation.  Medication Inspection Compliance: Pill count conducted under aseptic conditions, in front of the patient. Neither the pills nor the bottle was removed from the patient's sight at any time. Once count was completed pills were immediately returned to the patient in their original bottle.  Medication: Tramadol (Ultram) Pill/Patch Count: 100 of 180 pills remain Pill/Patch Appearance: Markings consistent with prescribed medication Bottle Appearance: Standard pharmacy container. Clearly labeled. Filled Date: 02/03/ 2020 Last Medication intake:  1 week ago

## 2018-11-23 ENCOUNTER — Telehealth: Payer: Self-pay | Admitting: Student in an Organized Health Care Education/Training Program

## 2018-11-23 NOTE — Telephone Encounter (Signed)
PA request submitted

## 2018-11-23 NOTE — Telephone Encounter (Signed)
Pt called and stated that his pain patches need a PA

## 2018-11-24 ENCOUNTER — Ambulatory Visit (INDEPENDENT_AMBULATORY_CARE_PROVIDER_SITE_OTHER): Payer: 59 | Admitting: Family Medicine

## 2018-11-24 ENCOUNTER — Encounter: Payer: Self-pay | Admitting: Family Medicine

## 2018-11-24 ENCOUNTER — Other Ambulatory Visit: Payer: Self-pay

## 2018-11-24 VITALS — BP 110/70 | HR 61 | Temp 97.6°F | Ht 71.0 in | Wt 168.2 lb

## 2018-11-24 DIAGNOSIS — I2510 Atherosclerotic heart disease of native coronary artery without angina pectoris: Secondary | ICD-10-CM | POA: Diagnosis not present

## 2018-11-24 DIAGNOSIS — E291 Testicular hypofunction: Secondary | ICD-10-CM | POA: Diagnosis not present

## 2018-11-24 DIAGNOSIS — K602 Anal fissure, unspecified: Secondary | ICD-10-CM | POA: Diagnosis not present

## 2018-11-24 DIAGNOSIS — G8929 Other chronic pain: Secondary | ICD-10-CM

## 2018-11-24 DIAGNOSIS — M545 Low back pain: Secondary | ICD-10-CM | POA: Diagnosis not present

## 2018-11-24 NOTE — Progress Notes (Signed)
Patient: Shawn Crawford Male    DOB: 06/09/1961   58 y.o.   MRN: [id number] Visit Date: 11/24/2018  Today's Provider: [name], MD   Chief Complaint  Patient presents with  . Follow-up    3 month low testosterone   Subjective:     HPI  Follow up for low testosterone The patient was last seen for this 3 months ago. Changes made at last visit include started pt on Androgel daily He reports excellent compliance with treatment. He feels that condition is Improved. He is not having side effects.   ------------------------------------------------------------------------------------ Overall he is feeling fine.  His energy level is improving some.  Continues he continues to get some pain relief through pain medicine  Allergies  Allergen Reactions  . Penicillins Itching and Other (See Comments)    Did it involve swelling of the face/tongue/throat, SOB, or low BP? No Did it involve sudden or severe rash/hives, skin peeling, or any reaction on the inside of your mouth or nose? No Did you need to seek medical attention at a hospital or doctor's office? Yes When did it last happen? Childhood allergy If all above answers are "NO", may proceed with cephalosporin use.      Current Outpatient Medications:  .  amLODipine (NORVASC) 5 MG tablet, Take 1 tablet (5 mg total) by mouth daily., Disp: 90 tablet, Rfl: 3 .  aspirin EC 81 MG tablet, Take 81 mg by mouth daily., Disp: , Rfl:  .  metoprolol tartrate (LOPRESSOR) 25 MG tablet, Take 0.5 tablets (12.5 mg total) by mouth 2 (two) times daily. (Patient taking differently: Take 25 mg by mouth 2 (two) times daily. ), Disp: 60 tablet, Rfl: 1 .  MULTIPLE VITAMINS PO, Take 1 tablet by mouth daily. , Disp: , Rfl:  .  rosuvastatin (CRESTOR) 40 MG tablet, TAKE 1 TABLET BY MOUTH  DAILY, Disp: 90 tablet, Rfl: 3 .  traMADol (ULTRAM) 50 MG tablet, Take by mouth 3 (three) times daily as needed. 2 tabs, Disp: , Rfl:  .  zolpidem (AMBIEN  CR) 12.5 MG CR tablet, Take 1 tablet (12.5 mg total) by mouth at bedtime as needed for sleep. Reviewed PMP Aware Alerts and stopped the Zolpidem 10 mg. (Patient taking differently: Take 12.5 mg by mouth at bedtime. ), Disp: 30 tablet, Rfl: 5 .  buprenorphine (BUTRANS) 10 MCG/HR PTWK patch, Place 1 patch onto the skin every 7 (seven) days. (Patient not taking: Reported on 11/24/2018), Disp: 4 patch, Rfl: 0 .  testosterone (TESTIM) 50 MG/5GM (1%) GEL, Place 5 g onto the skin daily., Disp: 30 Tube, Rfl: 5  Review of Systems  Constitutional: Negative.   HENT: Negative.   Eyes: Negative.   Respiratory: Negative.   Cardiovascular: Negative.   Gastrointestinal: Negative.   Endocrine: Negative.   Genitourinary: Negative.   Musculoskeletal: Negative.   Skin: Negative.   Allergic/Immunologic: Negative.   Neurological: Negative.   Hematological: Negative.   Psychiatric/Behavioral: Negative.     Social History   Tobacco Use  . Smoking status: Never Smoker  . Smokeless tobacco: Never Used  Substance Use Topics  . Alcohol use: Yes    Alcohol/week: 0.0 standard drinks    Comment: glass of wine occasionally      Objective:   BP 110/70 (BP Location: Right Arm, Patient Position: Sitting, Cuff Size: Normal)   Pulse 61   Temp 97.6 F (36.4 C) (Oral)   Ht 5\' 11"  (1.803 m)   Wt  168 lb 3.2 oz (76.3 kg)   SpO2 98%   BMI 23.46 kg/m  Vitals:   11/24/18 1418  BP: 110/70  Pulse: 61  Temp: 97.6 F (36.4 C)  TempSrc: Oral  SpO2: 98%  Weight: 168 lb 3.2 oz (76.3 kg)  Height: 5\' 11"  (1.803 m)     Physical Exam Vitals signs reviewed.  Constitutional:      Appearance: He is well-developed.  HENT:     Head: Normocephalic and atraumatic.     Right Ear: External ear normal.     Left Ear: External ear normal.     Nose: Nose normal.  Eyes:     General: No scleral icterus.    Conjunctiva/sclera: Conjunctivae normal.  Neck:     Thyroid: No thyromegaly.  Cardiovascular:     Rate and  Rhythm: Normal rate and regular rhythm.     Heart sounds: Normal heart sounds.  Pulmonary:     Effort: Pulmonary effort is normal.     Breath sounds: Normal breath sounds.  Abdominal:     Palpations: Abdomen is soft.  Skin:    General: Skin is warm and dry.  Neurological:     Mental Status: He is alert and oriented to person, place, and time.  Psychiatric:        Behavior: Behavior normal.        Thought Content: Thought content normal.        Judgment: Judgment normal.         Assessment & Plan    1. 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  2. Arteriosclerosis of coronary artery All risk factors treated  3. Chronic midline low back pain without sciatica The pain clinic.  Sees him tomorrow.  4. Hypogonadism in male Topical testosterone.  We will recheck a level this summer.    I have done the exam and reviewed the above chart and it is accurate to the best of my knowledge. Development worker, community has been used in this note in any air is in the dictation or transcription are unintentional.  [name], MD  [place]

## 2018-11-25 ENCOUNTER — Encounter: Payer: Self-pay | Admitting: Student in an Organized Health Care Education/Training Program

## 2018-11-25 MED ORDER — BUPRENORPHINE HCL 150 MCG BU FILM: 150.0000 ug | ORAL_FILM | Freq: Two times a day (BID) | BUCCAL | 0 refills | Status: DC

## 2018-11-25 NOTE — Telephone Encounter (Signed)
Insurance is not wanting to pay for his medicine. They want to try something different. They want to try Belbuca instead of Butran.

## 2018-11-26 ENCOUNTER — Telehealth: Payer: Self-pay

## 2018-11-26 NOTE — Telephone Encounter (Signed)
Patient notified

## 2018-11-30 ENCOUNTER — Telehealth: Payer: Self-pay | Admitting: *Deleted

## 2018-11-30 NOTE — Telephone Encounter (Signed)
I spoke with Dr. Holley Raring and he states that patient should take the Buprenorphine as directed and ONLY add in a Tramdol 1/day if needed for BTP. Patient called and informed. Instructed to call for any issues or concerns.

## 2018-12-09 ENCOUNTER — Encounter: Payer: 59 | Admitting: Student in an Organized Health Care Education/Training Program

## 2018-12-13 ENCOUNTER — Encounter: Payer: Self-pay | Admitting: Student in an Organized Health Care Education/Training Program

## 2018-12-13 ENCOUNTER — Other Ambulatory Visit: Payer: Self-pay

## 2018-12-13 ENCOUNTER — Ambulatory Visit
Payer: 59 | Attending: Student in an Organized Health Care Education/Training Program | Admitting: Student in an Organized Health Care Education/Training Program

## 2018-12-13 VITALS — BP 136/82 | HR 97 | Temp 98.9°F | Resp 16 | Ht 71.0 in | Wt 168.0 lb

## 2018-12-13 DIAGNOSIS — M5136 Other intervertebral disc degeneration, lumbar region: Secondary | ICD-10-CM

## 2018-12-13 DIAGNOSIS — M47816 Spondylosis without myelopathy or radiculopathy, lumbar region: Secondary | ICD-10-CM | POA: Insufficient documentation

## 2018-12-13 DIAGNOSIS — M545 Low back pain: Secondary | ICD-10-CM | POA: Insufficient documentation

## 2018-12-13 DIAGNOSIS — G894 Chronic pain syndrome: Secondary | ICD-10-CM

## 2018-12-13 DIAGNOSIS — G8929 Other chronic pain: Secondary | ICD-10-CM

## 2018-12-13 DIAGNOSIS — M5416 Radiculopathy, lumbar region: Secondary | ICD-10-CM

## 2018-12-13 MED ORDER — BUPRENORPHINE HCL 300 MCG BU FILM: 1.0000 | ORAL_FILM | Freq: Two times a day (BID) | BUCCAL | 0 refills | Status: DC

## 2018-12-13 NOTE — Patient Instructions (Addendum)
One prescription for Belbuca has been sent to your pharmacy.

## 2018-12-13 NOTE — Progress Notes (Signed)
Nursing Pain Medication Assessment:  Safety precautions to be maintained throughout the outpatient stay will include: orient to surroundings, keep bed in low position, maintain call bell within reach at all times, provide assistance with transfer out of bed and ambulation.  Medication Inspection Compliance: Pill count conducted under aseptic conditions, in front of the patient. Neither the pills nor the bottle was removed from the patient's sight at any time. Once count was completed pills were immediately returned to the patient in their original bottle.  Medication: Buprenorphine (Suboxone) Pill/Patch Count: 25 of 60 pills remain Pill/Patch Appearance: Markings consistent with prescribed medication Bottle Appearance: Standard pharmacy container. Clearly labeled. Filled Date: 02/27 / 2020 Last Medication intake:  Today

## 2018-12-13 NOTE — Progress Notes (Signed)
Patient's Name: Shawn Crawford  MRN: [id number]  Referring Provider: Jerrol Banana.,*  DOB: March 17, 1961  PCP: Jerrol Banana., MD  DOS: 12/13/2018  Note by: Gillis Santa, MD  Service setting: Ambulatory outpatient  Specialty: Interventional Pain Management  Location: [facility] (AMB) Pain Management Facility    Patient type: Established   Primary Reason(s) for Visit: Encounter for prescription drug management. (Level of risk: moderate)  CC: Back Pain (lower)  HPI  Shawn Crawford is a 58 y.o. year old, male patient, who comes today for a medication management evaluation. He has Abnormal blood sugar; Arteriosclerosis of coronary artery; ED (erectile dysfunction) of organic origin; Acid reflux; HLD (hyperlipidemia); BP (high blood pressure); Lumbar radiculopathy; Avitaminosis D; Hyperglycemia; Benign essential HTN; Chronic pain syndrome; Chronic midline low back pain without sciatica; Lumbar facet arthropathy; Lumbar degenerative disc disease; Piriformis muscle pain; Special screening for malignant neoplasms, colon; Benign neoplasm of descending colon; Diverticulosis of large intestine without diverticulitis; Anal fissure s/p partial internal anal sphincterotomy 11/11/2018; Melanosis of colon; and Internal hemorrhoids on their problem list. His primarily concern today is the Back Pain (lower)  Pain Assessment: Location: Lower Back Radiating: both legs to the feet Onset: More than a month ago Duration: Chronic pain Quality: Aching Severity: 6 /10 (subjective, self-reported pain score)  Note: Reported level is compatible with observation.                         When using our objective Pain Scale, levels between 6 and 10/10 are said to belong in an emergency room, as it progressively worsens from a 6/10, described as severely limiting, requiring emergency care not usually available at an outpatient pain management facility. At a 6/10 level, communication becomes difficult and requires great effort.  Assistance to reach the emergency department may be required. Facial flushing and profuse sweating along with potentially dangerous increases in heart rate and blood pressure will be evident. Effect on ADL:   Timing: Constant Modifying factors: lying on side with pillow between legs BP: 136/82  HR: 97  Shawn Crawford was last scheduled for an appointment on 11/23/2018 for medication management. During today's appointment we reviewed Shawn Crawford chronic pain status, as well as his outpatient medication regimen.  Patient follows up today for medication management.  He states that he was able to wean off his tramadol but did state that it was difficult.  He is glad that he had a good infrastructure and support system in place to help him wean off.  Patient is currently on belbuca 150 mcg twice daily.  He does find some benefit with this medication but states that there is room for improvement.  No side effects associated with this medication.  The patient  reports no history of drug use. His body mass index is 23.43 kg/m.  Further details on both, my assessment(s), as well as the proposed treatment plan, please see below.  Controlled Substance Pharmacotherapy Assessment REMS (Risk Evaluation and Mitigation Strategy)   11/25/2018  3   11/25/2018  Belbuca 150 MCG Film  60.00 30 Bi Lat   [id number]   Thr (4878)   0  0.30 mg  Comm Ins   [place]    Landis Martins, RN  12/13/2018  1:54 PM  Sign when Signing Visit Nursing Pain Medication Assessment:  Safety precautions to be maintained throughout the outpatient stay will include: orient to surroundings, keep bed in low position, maintain call bell within reach at  all times, provide assistance with transfer out of bed and ambulation.  Medication Inspection Compliance: Pill count conducted under aseptic conditions, in front of the patient. Neither the pills nor the bottle was removed from the patient's sight at any time. Once count was completed pills were immediately  returned to the patient in their original bottle.  Medication: Buprenorphine (Suboxone) Pill/Patch Count: 25 of 60 pills remain Pill/Patch Appearance: Markings consistent with prescribed medication Bottle Appearance: Standard pharmacy container. Clearly labeled. Filled Date: 02/27 / 2020 Last Medication intake:  Today Pharmacokinetics: Liberation and absorption (onset of action): WNL Distribution (time to peak effect): WNL Metabolism and excretion (duration of action): WNL         Pharmacodynamics: Desired effects: Analgesia: Shawn Crawford reports 50% benefit. Functional ability: Patient reports that medication does help, but not nearly as much as he would like Clinically meaningful improvement in function (CMIF): Sustained CMIF goals met Perceived effectiveness: Described as relatively effective but with some room for improvement Undesirable effects: Side-effects or Adverse reactions: None reported Monitoring: [place] PMP: Online review of the past 28-monthperiod conducted. Compliant with practice rules and regulations Last UDS on record: Summary  Date Value Ref Range Status  08/03/2018 FINAL  Final    Comment:    ==================================================================== TOXASSURE SELECT 13 (MW) ==================================================================== Test                             Result       Flag       Units Drug Present and Declared for Prescription Verification   Tramadol                       >2174        EXPECTED   ng/mg creat   O-Desmethyltramadol            >2174        EXPECTED   ng/mg creat   N-Desmethyltramadol            1101         EXPECTED   ng/mg creat    Source of tramadol is a prescription medication.    O-desmethyltramadol and N-desmethyltramadol are expected    metabolites of tramadol. Drug Present not Declared for Prescription Verification   Tapentadol                     191          UNEXPECTED ng/mg creat    Source of tapentadol is a  scheduled prescription medication. ==================================================================== Test                      Result    Flag   Units      Ref Range   Creatinine              230              mg/dL      >=20 ==================================================================== Declared Medications:  The flagging and interpretation on this report are based on the  following declared medications.  Unexpected results may arise from  inaccuracies in the declared medications.  **Note: The testing scope of this panel includes these medications:  Tramadol (Ultram)  **Note: The testing scope of this panel does not include following  reported medications:  Amlodipine (Norvasc)  Aspirin (Aspirin 81)  Fexofenadine (Allegra)  Hydrocortisone (Anusol HC)  Metoprolol (Lopressor)  Multivitamin  Omega-3 Fatty Acids (Fish Oil)  Rosuvastatin (Crestor)  Ubiquinone (CoQ10)  Zolpidem (Ambien) ==================================================================== For clinical consultation, please call [phone]. ====================================================================    UDS interpretation: Compliant          Medication Assessment Form: Reviewed. Patient indicates being compliant with therapy Treatment compliance: Compliant Risk Assessment Profile: Aberrant behavior: See initial evaluations. None observed or detected today Comorbid factors increasing risk of overdose: See initial evaluation. No additional risks detected today Opioid risk tool (ORT):  Opioid Risk  08/03/2018  Alcohol 0  Illegal Drugs 0  Rx Drugs 0  Alcohol 0  Illegal Drugs 0  Rx Drugs 0  Age between 16-45 years  0  History of Preadolescent Sexual Abuse 0  Psychological Disease 0  Depression 0  Opioid Risk Tool Scoring 0  Opioid Risk Interpretation Low Risk    ORT Scoring interpretation table:  Score <3 = Low Risk for SUD  Score between 4-7 = Moderate Risk for SUD  Score >8 = High Risk for  Opioid Abuse   Risk of substance use disorder (SUD): Low  Risk Mitigation Strategies:  Patient Counseling: Covered Patient-Prescriber Agreement (PPA): Present and active  Notification to other healthcare providers: Done  Pharmacologic Plan: Increase Belbuca to 300 mcg BID             Laboratory Chemistry  Inflammation Markers (CRP: Acute Phase) (ESR: Chronic Phase) Lab Results  Component Value Date   ESRSEDRATE 2 08/06/2018                         Rheumatology Markers Lab Results  Component Value Date   ANA Negative 08/06/2018   LABURIC 7.5 07/08/2017   HLAB27 Negative 05/12/2017                        Renal Function Markers Lab Results  Component Value Date   BUN 16 11/08/2018   CREATININE 1.11 11/08/2018   BCR 11 08/06/2018   GFRAA >60 11/08/2018   GFRNONAA >60 11/08/2018                             Hepatic Function Markers Lab Results  Component Value Date   AST 33 08/06/2018   ALT 40 08/06/2018   ALBUMIN 5.2 08/06/2018   ALKPHOS 69 08/06/2018                        Electrolytes Lab Results  Component Value Date   NA 142 11/08/2018   K 4.3 11/08/2018   CL 105 11/08/2018   CALCIUM 9.3 11/08/2018                        Neuropathy Markers Lab Results  Component Value Date   HGBA1C 5.8 (H) 02/12/2017                        CNS Tests No results found for: COLORCSF, APPEARCSF, RBCCOUNTCSF, WBCCSF, POLYSCSF, LYMPHSCSF, EOSCSF, PROTEINCSF, GLUCCSF, JCVIRUS, CSFOLI, IGGCSF                      Bone Pathology Markers Lab Results  Component Value Date   TESTOFREE 4.6 (L) 11/25/2017   TESTOSTERONE 226 (L) 08/06/2018  Coagulation Parameters Lab Results  Component Value Date   PLT 172 11/08/2018                        Cardiovascular Markers Lab Results  Component Value Date   CKTOTAL 75 08/06/2018   HGB 13.5 11/08/2018   HCT 40.7 11/08/2018                         CA Markers No results found for: CEA, CA125, LABCA2                       Endocrine Markers Lab Results  Component Value Date   TSH 1.660 04/22/2018   TESTOFREE 4.6 (L) 11/25/2017   TESTOSTERONE 226 (L) 08/06/2018                        Note: Lab results reviewed.  Recent Diagnostic Imaging Results  CT Abdomen Pelvis W Contrast CLINICAL DATA:  57 year old male with a history of unexplained weight loss  EXAM: CT CHEST, ABDOMEN, AND PELVIS WITH CONTRAST  TECHNIQUE: Multidetector CT imaging of the chest, abdomen and pelvis was performed following the standard protocol during bolus administration of intravenous contrast.  CONTRAST:  180m OMNIPAQUE IOHEXOL 300 MG/ML  SOLN  COMPARISON:  None.  FINDINGS: CT CHEST FINDINGS  Cardiovascular:  Heart:  Surgical changes of median sternotomy and CABG. Native coronary calcifications  Aorta:  Unremarkable course, caliber, contour of the thoracic aorta. No aneurysm or dissection flap. No periaortic fluid. Calcifications of the aortic arch. Branch vessels are patent. Descending thoracic aorta with mild atherosclerotic changes.  Pulmonary arteries:  Study is not tailored for the most sensitive evaluation of the pulmonary arteries, however, no central filling defects identified.  Mediastinum/Nodes: Small lymph nodes of the mediastinum. Calcified lymph nodes in the subcarinal and left hilar nodal stations. Unremarkable appearance of the thoracic esophagus.  Unremarkable appearance of the thoracic inlet.  Lungs/Pleura: Central airways are clear. No pleural effusion. No confluent airspace disease.  No pneumothorax.  Musculoskeletal: No acute displaced fracture. Degenerative changes of the spine.  Review of the MIP images confirms the above findings.  CT ABDOMEN PELVIS FINDINGS  Hepatobiliary: Unremarkable appearance of liver. Unremarkable gallbladder  Pancreas: Unremarkable pancreas  Spleen: Punctate calcifications throughout the spleen, compatible with prior  granulomatous disease.  Adrenals/Urinary Tract: Unremarkable appearance of the adrenal glands. No evidence of hydronephrosis of the right or left kidney. No nephrolithiasis. Unremarkable course of the bilateral ureters. Unremarkable appearance of the urinary bladder.  Stomach/Bowel: Unremarkable stomach. Unremarkable small bowel. Appendectomy. Moderate stool burden. Colonic diverticular disease without evidence of acute inflammation.  Vascular/Lymphatic: Atherosclerotic changes of the abdominal aorta. Mesenteric arteries and bilateral renal arteries are patent. Bilateral iliac arteries and proximal femoral arteries patent.  Reproductive: Diameter of the prostate measures 4.4 cm  Other: None  Musculoskeletal: No acute displaced fracture.  Degenerative changes.  IMPRESSION: No acute CT finding.  No finding to account for weight loss.  Surgical changes of median sternotomy and CABG.  Aortic Atherosclerosis (ICD10-I70.0).  Diverticular disease without evidence of acute diverticulitis.  Electronically Signed   By: JCorrie MckusickD.O.   On: 05/06/2018 09:57 CT Chest W Contrast CLINICAL DATA:  58year old male with a history of unexplained weight loss  EXAM: CT CHEST, ABDOMEN, AND PELVIS WITH CONTRAST  TECHNIQUE: Multidetector CT imaging of the chest, abdomen and pelvis was performed following the standard protocol  during bolus administration of intravenous contrast.  CONTRAST:  127m OMNIPAQUE IOHEXOL 300 MG/ML  SOLN  COMPARISON:  None.  FINDINGS: CT CHEST FINDINGS  Cardiovascular:  Heart:  Surgical changes of median sternotomy and CABG. Native coronary calcifications  Aorta:  Unremarkable course, caliber, contour of the thoracic aorta. No aneurysm or dissection flap. No periaortic fluid. Calcifications of the aortic arch. Branch vessels are patent. Descending thoracic aorta with mild atherosclerotic changes.  Pulmonary arteries:  Study is not tailored  for the most sensitive evaluation of the pulmonary arteries, however, no central filling defects identified.  Mediastinum/Nodes: Small lymph nodes of the mediastinum. Calcified lymph nodes in the subcarinal and left hilar nodal stations. Unremarkable appearance of the thoracic esophagus.  Unremarkable appearance of the thoracic inlet.  Lungs/Pleura: Central airways are clear. No pleural effusion. No confluent airspace disease.  No pneumothorax.  Musculoskeletal: No acute displaced fracture. Degenerative changes of the spine.  Review of the MIP images confirms the above findings.  CT ABDOMEN PELVIS FINDINGS  Hepatobiliary: Unremarkable appearance of liver. Unremarkable gallbladder  Pancreas: Unremarkable pancreas  Spleen: Punctate calcifications throughout the spleen, compatible with prior granulomatous disease.  Adrenals/Urinary Tract: Unremarkable appearance of the adrenal glands. No evidence of hydronephrosis of the right or left kidney. No nephrolithiasis. Unremarkable course of the bilateral ureters. Unremarkable appearance of the urinary bladder.  Stomach/Bowel: Unremarkable stomach. Unremarkable small bowel. Appendectomy. Moderate stool burden. Colonic diverticular disease without evidence of acute inflammation.  Vascular/Lymphatic: Atherosclerotic changes of the abdominal aorta. Mesenteric arteries and bilateral renal arteries are patent. Bilateral iliac arteries and proximal femoral arteries patent.  Reproductive: Diameter of the prostate measures 4.4 cm  Other: None  Musculoskeletal: No acute displaced fracture.  Degenerative changes.  IMPRESSION: No acute CT finding.  No finding to account for weight loss.  Surgical changes of median sternotomy and CABG.  Aortic Atherosclerosis (ICD10-I70.0).  Diverticular disease without evidence of acute diverticulitis.  Electronically Signed   By: JCorrie MckusickD.O.   On: 05/06/2018 09:57  Complexity Note:  Imaging results reviewed. Results shared with Shawn Crawford using Layman's terms.                               Meds   Current Outpatient Medications:  .  amLODipine (NORVASC) 5 MG tablet, Take 1 tablet (5 mg total) by mouth daily., Disp: 90 tablet, Rfl: 3 .  aspirin EC 81 MG tablet, Take 81 mg by mouth daily., Disp: , Rfl:  .  metoprolol tartrate (LOPRESSOR) 25 MG tablet, Take 0.5 tablets (12.5 mg total) by mouth 2 (two) times daily. (Patient taking differently: Take 25 mg by mouth 2 (two) times daily. ), Disp: 60 tablet, Rfl: 1 .  MULTIPLE VITAMINS PO, Take 1 tablet by mouth daily. , Disp: , Rfl:  .  rosuvastatin (CRESTOR) 40 MG tablet, TAKE 1 TABLET BY MOUTH  DAILY, Disp: 90 tablet, Rfl: 3 .  zolpidem (AMBIEN CR) 12.5 MG CR tablet, Take 1 tablet (12.5 mg total) by mouth at bedtime as needed for sleep. Reviewed PMP Aware Alerts and stopped the Zolpidem 10 mg. (Patient taking differently: Take 12.5 mg by mouth at bedtime. ), Disp: 30 tablet, Rfl: 5 .  [START ON 12/16/2018] Buprenorphine HCl (BELBUCA) 300 MCG FILM, Place 1 Film inside cheek every 12 (twelve) hours for 30 days., Disp: 60 each, Rfl: 0 .  testosterone (TESTIM) 50 MG/5GM (1%) GEL, Place 5 g onto the skin daily., Disp: 30  Tube, Rfl: 5 .  traMADol (ULTRAM) 50 MG tablet, Take by mouth 3 (three) times daily as needed. 2 tabs, Disp: , Rfl:   ROS  Constitutional: Denies any fever or chills Gastrointestinal: No reported hemesis, hematochezia, vomiting, or acute GI distress Musculoskeletal: Denies any acute onset joint swelling, redness, loss of ROM, or weakness Neurological: No reported episodes of acute onset apraxia, aphasia, dysarthria, agnosia, amnesia, paralysis, loss of coordination, or loss of consciousness  Allergies  Shawn Crawford is allergic to penicillins.  Shawn Crawford  Drug: Shawn Crawford  reports no history of drug use. Alcohol:  reports current alcohol use. Tobacco:  reports that he has never smoked. He has never used smokeless  tobacco. Medical:  has a past medical history of cardiac catheterization, Hyperlipidemia, and Hypertension. Surgical: Shawn Crawford  has a past surgical history that includes Coronary artery bypass graft; Hernia repair; Appendectomy; Cardiac catheterization; Colonoscopy with propofol (N/A, 06/24/2018); Esophagogastroduodenoscopy (egd) with propofol (N/A, 06/24/2018); and Exam under anesthesia with hemorrhoidectomy (N/A, 11/11/2018). Family: family history includes Colon cancer in his sister; Diabetes in his father; Hyperlipidemia in his mother; Hypertension in his father; Thyroid cancer in his sister.  Constitutional Exam  General appearance: Well nourished, well developed, and well hydrated. In no apparent acute distress Vitals:   12/13/18 1350  BP: 136/82  Pulse: 97  Resp: 16  Temp: 98.9 F (37.2 C)  TempSrc: Oral  SpO2: 100%  Weight: 168 lb (76.2 kg)  Height: 5' 11" (1.803 m)   BMI Assessment: Estimated body mass index is 23.43 kg/m as calculated from the following:   Height as of this encounter: 5' 11" (1.803 m).   Weight as of this encounter: 168 lb (76.2 kg).  BMI interpretation table: BMI level Category Range association with higher incidence of chronic pain  <18 kg/m2 Underweight   18.5-24.9 kg/m2 Ideal body weight   25-29.9 kg/m2 Overweight Increased incidence by 20%  30-34.9 kg/m2 Obese (Class I) Increased incidence by 68%  35-39.9 kg/m2 Severe obesity (Class II) Increased incidence by 136%  >40 kg/m2 Extreme obesity (Class III) Increased incidence by 254%   Patient's current BMI Ideal Body weight  Body mass index is 23.43 kg/m. Ideal body weight: 75.3 kg (166 lb 0.1 oz) Adjusted ideal body weight: 75.7 kg (166 lb 12.9 oz)   BMI Readings from Last 4 Encounters:  12/13/18 23.43 kg/m  11/24/18 23.46 kg/m  11/18/18 22.59 kg/m  11/11/18 23.03 kg/m   Wt Readings from Last 4 Encounters:  12/13/18 168 lb (76.2 kg)  11/24/18 168 lb 3.2 oz (76.3 kg)  11/18/18 162 lb (73.5  kg)  11/11/18 165 lb 2 oz (74.9 kg)  Psych/Mental status: Alert, oriented x 3 (person, place, & time)       Eyes: PERLA Respiratory: No evidence of acute respiratory distress  Cervical Spine Area Exam  Skin & Axial Inspection: No masses, redness, edema, swelling, or associated skin lesions Alignment: Symmetrical Functional ROM: Unrestricted ROM      Stability: No instability detected Muscle Tone/Strength: Functionally intact. No obvious neuro-muscular anomalies detected. Sensory (Neurological): Unimpaired Palpation: No palpable anomalies              Upper Extremity (UE) Exam    Side: Right upper extremity  Side: Left upper extremity  Skin & Extremity Inspection: Skin color, temperature, and hair growth are WNL. No peripheral edema or cyanosis. No masses, redness, swelling, asymmetry, or associated skin lesions. No contractures.  Skin & Extremity Inspection: Skin color, temperature, and hair growth are  WNL. No peripheral edema or cyanosis. No masses, redness, swelling, asymmetry, or associated skin lesions. No contractures.  Functional ROM: Unrestricted ROM          Functional ROM: Unrestricted ROM          Muscle Tone/Strength: Functionally intact. No obvious neuro-muscular anomalies detected.  Muscle Tone/Strength: Functionally intact. No obvious neuro-muscular anomalies detected.  Sensory (Neurological): Unimpaired          Sensory (Neurological): Unimpaired          Palpation: No palpable anomalies              Palpation: No palpable anomalies              Provocative Test(s):  Phalen's test: deferred Tinel's test: deferred Apley's scratch test (touch opposite shoulder):  Action 1 (Across chest): deferred Action 2 (Overhead): deferred Action 3 (LB reach): deferred   Provocative Test(s):  Phalen's test: deferred Tinel's test: deferred Apley's scratch test (touch opposite shoulder):  Action 1 (Across chest): deferred Action 2 (Overhead): deferred Action 3 (LB reach): deferred     Thoracic Spine Area Exam  Skin & Axial Inspection: No masses, redness, or swelling Alignment: Symmetrical Functional ROM: Unrestricted ROM Stability: No instability detected Muscle Tone/Strength: Functionally intact. No obvious neuro-muscular anomalies detected. Sensory (Neurological): Musculoskeletal pain pattern Muscle strength & Tone: No palpable anomalies  Lumbar Spine Area Exam  Skin & Axial Inspection: No masses, redness, or swelling Alignment: Symmetrical Functional ROM: Unrestricted ROM       Stability: No instability detected Muscle Tone/Strength: Functionally intact. No obvious neuro-muscular anomalies detected. Sensory (Neurological): Musculoskeletal pain pattern Palpation: No palpable anomalies       Provocative Tests: Hyperextension/rotation test: (+) due to pain. Lumbar quadrant test (Kemp's test): (+) due to pain. Lateral bending test: deferred today       Patrick's Maneuver: deferred today                   FABER* test: deferred today                   S-I anterior distraction/compression test: deferred today         S-I lateral compression test: deferred today         S-I Thigh-thrust test: deferred today         S-I Gaenslen's test: deferred today         *(Flexion, ABduction and External Rotation)  Gait & Posture Assessment  Ambulation: Unassisted Gait: Relatively normal for age and body habitus Posture: WNL   Lower Extremity Exam    Side: Right lower extremity  Side: Left lower extremity  Stability: No instability observed          Stability: No instability observed          Skin & Extremity Inspection: Skin color, temperature, and hair growth are WNL. No peripheral edema or cyanosis. No masses, redness, swelling, asymmetry, or associated skin lesions. No contractures.  Skin & Extremity Inspection: Skin color, temperature, and hair growth are WNL. No peripheral edema or cyanosis. No masses, redness, swelling, asymmetry, or associated skin lesions. No  contractures.  Functional ROM: Unrestricted ROM                  Functional ROM: Unrestricted ROM                  Muscle Tone/Strength: Functionally intact. No obvious neuro-muscular anomalies detected.  Muscle Tone/Strength: Functionally intact. No obvious neuro-muscular anomalies  detected.  Sensory (Neurological): Unimpaired        Sensory (Neurological): Unimpaired        DTR: Patellar: deferred today Achilles: deferred today Plantar: deferred today  DTR: Patellar: deferred today Achilles: deferred today Plantar: deferred today  Palpation: No palpable anomalies  Palpation: No palpable anomalies   Assessment   Status Diagnosis  Controlled Controlled Controlled 1. Lumbar radiculopathy   2. Chronic midline low back pain without sciatica   3. Lumbar facet arthropathy   4. Chronic pain syndrome   5. Lumbar degenerative disc disease      Please that patient was able to wean off tramadol.  Is finding some benefit with belbuca however will try an increased dose to 300 mcg twice daily.  This may provide better analgesic response.  Patient will follow-up in 1 month.  Interventional history: Has tried 2 series of lumbar facet medial branch nerve blocks which were not effective.  Patient's lumbar MRI significant for lumbar spondylosis, facet arthropathy, L3-L4 disc bulge.  Has also tried physical therapy not helpful. Previous opioid trials: Tramadol, Nucynta, hydrocodone.  Butrans patch was not covered by pharmacy.  Patient is currently trialing belbuca.  Plan of Care  Pharmacotherapy (Medications Ordered): Meds ordered this encounter  Medications  . Buprenorphine HCl (BELBUCA) 300 MCG FILM    Sig: Place 1 Film inside cheek every 12 (twelve) hours for 30 days.    Dispense:  60 each    Refill:  0   Time Note: Greater than 50% of the 25 minute(s) of face-to-face time spent with Shawn Crawford, was spent in counseling/coordination of care regarding: Shawn Crawford primary cause of pain, the  treatment plan, medication side effects, the opioid analgesic risks and possible complications, the appropriate use of his medications, realistic expectations, the medication agreement and the patient's responsibilities when it comes to controlled substances.  Provider-requested follow-up: Return in about 4 weeks (around 01/10/2019) for Medication Management.  Future Appointments  Date Time Provider [place]  01/11/2019 12:15 PM Gillis Santa, MD [facility] None  04/13/2019  9:40 AM Jerrol Banana., MD [facility] None    Primary Care Physician: Jerrol Banana., MD Location: [facility] Note by: Gillis Santa, M.D Date: 12/13/2018; Time: 4:28 PM  Patient Instructions  One prescription for Belbuca has been sent to your pharmacy.

## 2019-01-05 ENCOUNTER — Ambulatory Visit
Payer: 59 | Attending: Student in an Organized Health Care Education/Training Program | Admitting: Student in an Organized Health Care Education/Training Program

## 2019-01-05 ENCOUNTER — Other Ambulatory Visit: Payer: Self-pay

## 2019-01-05 DIAGNOSIS — G8929 Other chronic pain: Secondary | ICD-10-CM

## 2019-01-05 DIAGNOSIS — M5416 Radiculopathy, lumbar region: Secondary | ICD-10-CM | POA: Diagnosis not present

## 2019-01-05 DIAGNOSIS — M47816 Spondylosis without myelopathy or radiculopathy, lumbar region: Secondary | ICD-10-CM

## 2019-01-05 DIAGNOSIS — M545 Low back pain, unspecified: Secondary | ICD-10-CM

## 2019-01-05 DIAGNOSIS — M5136 Other intervertebral disc degeneration, lumbar region: Secondary | ICD-10-CM | POA: Diagnosis not present

## 2019-01-05 MED ORDER — BUPRENORPHINE HCL 450 MCG BU FILM: 1.0000 | ORAL_FILM | Freq: Two times a day (BID) | BUCCAL | 1 refills | Status: DC

## 2019-01-05 NOTE — Progress Notes (Signed)
Pain Management Encounter Note - Virtual Visit via Telephone Telehealth (real-time audio visits between healthcare provider and patient).  Patient's Phone No. & Preferred Pharmacy:  [phone] (home); [phone] (mobile); (Preferred) [phone]  [place], [place] - [address] [place] [address] Phone: [phone] Fax: [phone]  [place], [facility] [address] [place] [address] [place] [address] Phone: [phone] Fax: [phone]   Pre-screening note:  Our staff contacted Shawn Crawford and offered him an "in person", "face-to-face" appointment versus a telephone encounter. He indicated preferring the telephone encounter, at this time.  Reason for Virtual Visit: COVID-19*  Social distancing based on CDC and AMA recommendations.   I contacted Shawn Crawford on 01/05/2019 at 11:19 AM by telephone and clearly identified myself as Gillis Santa, MD. I verified that I was speaking with the correct person using two identifiers (Name and date of birth: Shawn Crawford, Shawn Crawford).  Advanced Informed Consent I sought verbal advanced consent from Shawn Crawford for telemedicine interactions and virtual visit. I informed Shawn Crawford of the security and privacy concerns, risks, and limitations associated with performing an evaluation and management service by telephone. I also informed Shawn Crawford of the availability of "in person" appointments and I informed him of the possibility of a patient responsible charge related to this service. Shawn Crawford expressed understanding and agreed to proceed.   Historic Elements   Shawn Crawford is a 58 y.o. year old, male patient evaluated today after his last encounter by our practice on 12/13/2018. Shawn Crawford  has a past medical history of cardiac catheterization, Hyperlipidemia, and Hypertension. He also  has a past surgical history that includes Coronary artery bypass graft; Hernia  repair; Appendectomy; Cardiac catheterization; Colonoscopy with propofol (N/A, 06/24/2018); Esophagogastroduodenoscopy (egd) with propofol (N/A, 06/24/2018); and Exam under anesthesia with hemorrhoidectomy (N/A, 11/11/2018). Shawn Crawford has a current medication list which includes the following prescription(s): amlodipine, aspirin ec, buprenorphine hcl, metoprolol tartrate, multiple vitamin, rosuvastatin, testosterone, tramadol, and zolpidem. He  reports that he has never smoked. He has never used smokeless tobacco. He reports current alcohol use. He reports that he does not use drugs. Shawn Crawford is allergic to penicillins.   HPI  I last saw him on 12/13/2018. He is being evaluated for medication management.  Follow-up for medication management.  Is endorsing increased pain relief after dose increase from 150 mcg to 300 mcg.  Still continues to have pretty significant pain towards the end of his day.  Is not endorsing any side effect such as constipation sedation, respiratory issues with current belbuca dose.  Recommend increasing to 450 mcg twice daily.  Pharmacotherapy Assessment   12/13/2018  3   12/13/2018  Belbuca 300 MCG Film  60.00 30 Bi Lat   [id number]   Thr (4878)   0  0.60 mg  Comm Ins   [place]     Monitoring: Pharmacotherapy: No side-effects or adverse reactions reported. [place] PMP: PDMP reviewed during this encounter.       Compliance: No problems identified. Plan: Refer to "POC".  Review of recent tests  CT Abdomen Pelvis W Contrast CLINICAL DATA:  58 year old male with a history of unexplained weight loss  EXAM: CT CHEST, ABDOMEN, AND PELVIS WITH CONTRAST  TECHNIQUE: Multidetector CT imaging of the chest, abdomen and pelvis was performed following the standard protocol during bolus administration of intravenous contrast.  CONTRAST:  117mL OMNIPAQUE IOHEXOL 300 MG/ML  SOLN  COMPARISON:  None.  FINDINGS: CT CHEST FINDINGS  Cardiovascular:  Heart:  Surgical changes of median  sternotomy and CABG. Native coronary calcifications  Aorta:  Unremarkable course, caliber, contour of the thoracic aorta. No aneurysm or dissection flap. No periaortic fluid. Calcifications of the aortic arch. Branch vessels are patent. Descending thoracic aorta with mild atherosclerotic changes.  Pulmonary arteries:  Study is not tailored for the most sensitive evaluation of the pulmonary arteries, however, no central filling defects identified.  Mediastinum/Nodes: Small lymph nodes of the mediastinum. Calcified lymph nodes in the subcarinal and left hilar nodal stations. Unremarkable appearance of the thoracic esophagus.  Unremarkable appearance of the thoracic inlet.  Lungs/Pleura: Central airways are clear. No pleural effusion. No confluent airspace disease.  No pneumothorax.  Musculoskeletal: No acute displaced fracture. Degenerative changes of the spine.  Review of the MIP images confirms the above findings.  CT ABDOMEN PELVIS FINDINGS  Hepatobiliary: Unremarkable appearance of liver. Unremarkable gallbladder  Pancreas: Unremarkable pancreas  Spleen: Punctate calcifications throughout the spleen, compatible with prior granulomatous disease.  Adrenals/Urinary Tract: Unremarkable appearance of the adrenal glands. No evidence of hydronephrosis of the right or left kidney. No nephrolithiasis. Unremarkable course of the bilateral ureters. Unremarkable appearance of the urinary bladder.  Stomach/Bowel: Unremarkable stomach. Unremarkable small bowel. Appendectomy. Moderate stool burden. Colonic diverticular disease without evidence of acute inflammation.  Vascular/Lymphatic: Atherosclerotic changes of the abdominal aorta. Mesenteric arteries and bilateral renal arteries are patent. Bilateral iliac arteries and proximal femoral arteries patent.  Reproductive: Diameter of the prostate measures 4.4 cm  Other: None  Musculoskeletal: No acute displaced fracture.   Degenerative changes.  IMPRESSION: No acute CT finding.  No finding to account for weight loss.  Surgical changes of median sternotomy and CABG.  Aortic Atherosclerosis (ICD10-I70.0).  Diverticular disease without evidence of acute diverticulitis.  Electronically Signed   By: Corrie Mckusick D.O.   On: Crawford/Crawford/2019 09:57 CT Chest W Contrast CLINICAL DATA:  58 year old male with a history of unexplained weight loss  EXAM: CT CHEST, ABDOMEN, AND PELVIS WITH CONTRAST  TECHNIQUE: Multidetector CT imaging of the chest, abdomen and pelvis was performed following the standard protocol during bolus administration of intravenous contrast.  CONTRAST:  146mL OMNIPAQUE IOHEXOL 300 MG/ML  SOLN  COMPARISON:  None.  FINDINGS: CT CHEST FINDINGS  Cardiovascular:  Heart:  Surgical changes of median sternotomy and CABG. Native coronary calcifications  Aorta:  Unremarkable course, caliber, contour of the thoracic aorta. No aneurysm or dissection flap. No periaortic fluid. Calcifications of the aortic arch. Branch vessels are patent. Descending thoracic aorta with mild atherosclerotic changes.  Pulmonary arteries:  Study is not tailored for the most sensitive evaluation of the pulmonary arteries, however, no central filling defects identified.  Mediastinum/Nodes: Small lymph nodes of the mediastinum. Calcified lymph nodes in the subcarinal and left hilar nodal stations. Unremarkable appearance of the thoracic esophagus.  Unremarkable appearance of the thoracic inlet.  Lungs/Pleura: Central airways are clear. No pleural effusion. No confluent airspace disease.  No pneumothorax.  Musculoskeletal: No acute displaced fracture. Degenerative changes of the spine.  Review of the MIP images confirms the above findings.  CT ABDOMEN PELVIS FINDINGS  Hepatobiliary: Unremarkable appearance of liver. Unremarkable gallbladder  Pancreas: Unremarkable pancreas  Spleen: Punctate  calcifications throughout the spleen, compatible with prior granulomatous disease.  Adrenals/Urinary Tract: Unremarkable appearance of the adrenal glands. No evidence of hydronephrosis of the right or left kidney. No nephrolithiasis. Unremarkable course of the bilateral ureters. Unremarkable appearance of the urinary bladder.  Stomach/Bowel: Unremarkable stomach. Unremarkable small bowel. Appendectomy. Moderate stool burden. Colonic diverticular disease without evidence of acute inflammation.  Vascular/Lymphatic: Atherosclerotic changes of the abdominal aorta. Mesenteric arteries and bilateral renal arteries are patent. Bilateral iliac arteries and proximal femoral arteries patent.  Reproductive: Diameter of the prostate measures 4.4 cm  Other: None  Musculoskeletal: No acute displaced fracture.  Degenerative changes.  IMPRESSION: No acute CT finding.  No finding to account for weight loss.  Surgical changes of median sternotomy and CABG.  Aortic Atherosclerosis (ICD10-I70.0).  Diverticular disease without evidence of acute diverticulitis.  Electronically Signed   By: Corrie Mckusick D.O.   On: Crawford/Crawford/2019 09:57   Hospital Outpatient Visit on 11/08/2018  Component Date Value Ref Range Status  . Sodium 11/08/2018 142  135 - 145 mmol/L Final  . Potassium 11/08/2018 4.3  3.5 - 5.1 mmol/L Final  . Chloride 11/08/2018 105  98 - 111 mmol/L Final  . CO2 11/08/2018 29  22 - 32 mmol/L Final  . Glucose, Bld 11/08/2018 117* 70 - 99 mg/dL Final  . BUN 11/08/2018 16  6 - 20 mg/dL Final  . Creatinine, Ser 11/08/2018 1.11  0.61 - 1.24 mg/dL Final  . Calcium 11/08/2018 9.3  8.9 - 10.3 mg/dL Final  . GFR calc non Af Amer 11/08/2018 >60  >60 mL/min Final  . GFR calc Af Amer 11/08/2018 >60  >60 mL/min Final  . Anion gap 11/08/2018 8  5 - 15 Final   Performed at [facility], [place] [address], [place], [place] [address]  . WBC 11/08/2018 7.3  4.0 - 10.5 K/uL Final  . RBC  11/08/2018 4.50  4.22 - 5.81 MIL/uL Final  . Hemoglobin 11/08/2018 13.5  13.0 - 17.0 g/dL Final  . HCT 11/08/2018 40.7  39.0 - 52.0 % Final  . MCV 11/08/2018 90.4  80.0 - 100.0 fL Final  . MCH 11/08/2018 30.0  26.0 - 34.0 pg Final  . MCHC 11/08/2018 33.2  30.0 - 36.0 g/dL Final  . RDW 11/08/2018 11.5  11.5 - 15.5 % Final  . Platelets 11/08/2018 172  150 - 400 K/uL Final  . nRBC 11/08/2018 0.0  0.0 - 0.2 % Final   Performed at [facility], [place] [address], [place], [place] [address]   Assessment  The primary encounter diagnosis was Lumbar radiculopathy. Diagnoses of Chronic midline low back pain without sciatica, Lumbar facet arthropathy, and Lumbar degenerative disc disease were also pertinent to this visit.  Plan of Care  I have discontinued Shawn K. Sine "Kevin"'s Buprenorphine HCl. I am also having him start on Buprenorphine HCl. Additionally, I am having him maintain his MULTIPLE VITAMINS PO, aspirin EC, metoprolol tartrate, amLODipine, testosterone, zolpidem, rosuvastatin, and traMADol.  Improved analgesic response with dose escalation from belbuca 150 to 300 mcg twice daily.  Still endorsing moderate to severe pain towards the end of his day.  No side effects from belbuca.  Will increase to 450 mcg twice daily.  Pharmacotherapy (Medications Ordered): Meds ordered this encounter  Medications  . Buprenorphine HCl (BELBUCA) 450 MCG FILM    Sig: Place 1 Film inside cheek every 12 (twelve) hours.    Dispense:  60 each    Refill:  1   Orders:  No orders of the defined types were placed in this encounter.  Follow-up plan:   Return in about 8 weeks (around 03/02/2019) for Medication Management, virtual.   I discussed the assessment and treatment plan with the patient. The patient  was provided an opportunity to ask questions and all were answered. The patient agreed with the plan and demonstrated an understanding of the instructions.  Patient advised to call back or  seek an in-person evaluation if the symptoms or condition worsens.  Total duration of non-face-to-face encounter:21 minutes.  Note by: Gillis Santa, MD Date: 01/05/2019; Time: 11:19 AM  Disclaimer:  * Given the special circumstances of the COVID-19 pandemic, the federal government has announced that the Office for Civil Rights (OCR) will exercise its enforcement discretion and will not impose penalties on physicians using telehealth in the event of noncompliance with regulatory requirements under the [place] and Accountability Act (HIPAA) in connection with the good faith provision of telehealth during the UCLTV-98 national public health emergency. ([place])

## 2019-01-11 ENCOUNTER — Encounter: Payer: 59 | Admitting: Student in an Organized Health Care Education/Training Program

## 2019-02-25 ENCOUNTER — Other Ambulatory Visit: Payer: Self-pay | Admitting: Family Medicine

## 2019-02-25 DIAGNOSIS — R7989 Other specified abnormal findings of blood chemistry: Secondary | ICD-10-CM

## 2019-03-01 ENCOUNTER — Encounter: Payer: Self-pay | Admitting: Student in an Organized Health Care Education/Training Program

## 2019-03-01 ENCOUNTER — Other Ambulatory Visit: Payer: Self-pay

## 2019-03-01 ENCOUNTER — Ambulatory Visit
Payer: 59 | Attending: Student in an Organized Health Care Education/Training Program | Admitting: Student in an Organized Health Care Education/Training Program

## 2019-03-01 DIAGNOSIS — M545 Low back pain: Secondary | ICD-10-CM

## 2019-03-01 DIAGNOSIS — M5416 Radiculopathy, lumbar region: Secondary | ICD-10-CM

## 2019-03-01 DIAGNOSIS — G894 Chronic pain syndrome: Secondary | ICD-10-CM

## 2019-03-01 DIAGNOSIS — M5136 Other intervertebral disc degeneration, lumbar region: Secondary | ICD-10-CM

## 2019-03-01 DIAGNOSIS — M47816 Spondylosis without myelopathy or radiculopathy, lumbar region: Secondary | ICD-10-CM | POA: Diagnosis not present

## 2019-03-01 DIAGNOSIS — G8929 Other chronic pain: Secondary | ICD-10-CM

## 2019-03-01 DIAGNOSIS — M7918 Myalgia, other site: Secondary | ICD-10-CM

## 2019-03-01 MED ORDER — BUPRENORPHINE HCL 600 MCG BU FILM: 1.0000 | ORAL_FILM | Freq: Two times a day (BID) | BUCCAL | 0 refills | Status: DC

## 2019-03-01 NOTE — Progress Notes (Signed)
Pain Management Virtual Encounter Note - Virtual Visit via Telephone Telehealth (real-time audio visits between healthcare provider and Shawn Crawford).  Shawn Crawford's Phone No. & Preferred Pharmacy:  [phone] (home); [phone] (mobile); (Preferred) [phone] [email or web address]  Shawn Crawford, [place] - [place] [address] Phone: [phone] Fax: [phone]  Shawn Crawford, Shawn Crawford Phone: [phone] Fax: [phone]   Pre-screening note:  Our staff contacted Shawn Crawford and offered him an "in person", "face-to-face" appointment versus a telephone encounter. He indicated preferring the telephone encounter, at this time.  Reason for Virtual Visit: COVID-19*  Social distancing based on CDC and AMA recommendations.   I contacted Shawn Crawford on 03/01/2019 at 10:40 AM via telephone.      I clearly identified myself as Gillis Santa, MD. I verified that I was speaking with the correct person using two identifiers (Name and date of birth: 03-06-1961).  Advanced Informed Consent I sought verbal advanced consent from Shawn Crawford for virtual visit interactions. I informed Shawn Crawford of possible security and privacy concerns, risks, and limitations associated with providing "not-in-person" medical evaluation and management services. I also informed Shawn Crawford of the availability of "in-person" appointments. Finally, I informed him that there would be a charge for the virtual visit and that he could be  personally, fully or partially, financially responsible for it. Shawn Crawford expressed understanding and agreed to proceed.   Historic Elements   Shawn Crawford is a 58 y.o. year old, male Shawn Crawford evaluated today after his last encounter by our practice on 01/05/2019. Shawn Crawford  has a past medical history of cardiac catheterization, Hyperlipidemia, and  Hypertension. He also  has a past surgical history that includes Coronary artery bypass graft; Hernia repair; Appendectomy; Cardiac catheterization; Colonoscopy with propofol (N/A, 06/24/2018); Esophagogastroduodenoscopy (egd) with propofol (N/A, 06/24/2018); and Exam under anesthesia with hemorrhoidectomy (N/A, 11/11/2018). Shawn Crawford has a current medication list which includes the following prescription(s): amlodipine, aspirin ec, metoprolol tartrate, multiple vitamin, rosuvastatin, testosterone, zolpidem, buprenorphine hcl, testosterone, and tramadol. He  reports that he has never smoked. He has never used smokeless tobacco. He reports current alcohol use. He reports that he does not use drugs. Shawn Crawford is allergic to penicillins.   HPI  Today, he is being contacted for medication management.  Shawn Crawford states that belbuca provided him with baseline chronic pain relief for approximately 2 weeks but thereafter became slightly less effective.  Shawn Crawford has been on tramadol in the past.  He states that compared to his current dose of belbuca, tramadol 100 mg twice daily to 3 times daily as needed was more effective.  We discussed pharmacokinetics and how belbuca is better effective at managing chronic baseline pain and that psychologically he will not experience more immediate pain relief as he would 1 taking tramadol.  Shawn Crawford does feel that his overall baseline pain has improved.  Options include increasing belbuca, going back to tramadol 100 mg twice daily to 3 times daily as needed or transitioning to [place].  Shawn Crawford states he would like to consider increased dose of Belbuca 600 mcg twice daily.  This is reasonable.  Pharmacotherapy Assessment   02/02/2019  3   01/05/2019  Belbuca 450 MCG Film  60.00 30 Bi Lat   [id number]   Thr (4878)   1  0.90 mg  Comm Ins   [place]  Monitoring: Pharmacotherapy: No side-effects or adverse reactions reported. [place] PMP: PDMP reviewed during this encounter.       Compliance:  No problems identified. Effectiveness: Clinically acceptable. Plan: Refer to "POC".  Pertinent Labs  Renal Function Lab Results  Component Value Date   BUN 16 11/08/2018   CREATININE 1.11 11/08/2018   BCR 11 08/06/2018   GFRAA >60 11/08/2018   GFRNONAA >60 11/08/2018   Hepatic Function Lab Results  Component Value Date   AST 33 08/06/2018   ALT 40 08/06/2018   ALBUMIN 5.2 08/06/2018   UDS Summary  Date Value Ref Range Status  08/03/2018 FINAL  Final    Comment:    ==================================================================== TOXASSURE SELECT 13 (MW) ==================================================================== Test                             Result       Flag       Units Drug Present and Declared for Prescription Verification   Tramadol                       >2174        EXPECTED   ng/mg creat   O-Desmethyltramadol            >2174        EXPECTED   ng/mg creat   N-Desmethyltramadol            1101         EXPECTED   ng/mg creat    Source of tramadol is a prescription medication.    O-desmethyltramadol and N-desmethyltramadol are expected    metabolites of tramadol. Drug Present not Declared for Prescription Verification   Tapentadol                     191          UNEXPECTED ng/mg creat    Source of tapentadol is a scheduled prescription medication. ==================================================================== Test                      Result    Flag   Units      Ref Range   Creatinine              230              mg/dL      >=20 ==================================================================== Declared Medications:  The flagging and interpretation on this report are based on the  following declared medications.  Unexpected results may arise from  inaccuracies in the declared medications.  **Note: The testing scope of this panel includes these medications:  Tramadol (Ultram)  **Note: The testing scope of this panel does not include  following  reported medications:  Amlodipine (Norvasc)  Aspirin (Aspirin 81)  Fexofenadine (Allegra)  Hydrocortisone (Anusol HC)  Metoprolol (Lopressor)  Multivitamin  Omega-3 Fatty Acids (Fish Oil)  Rosuvastatin (Crestor)  Ubiquinone (CoQ10)  Zolpidem (Ambien) ==================================================================== For clinical consultation, please call [phone]. ====================================================================    Note: Above Lab results reviewed.  Recent imaging  CT Abdomen Pelvis W Contrast CLINICAL DATA:  58 year old male with a history of unexplained weight loss  EXAM: CT CHEST, ABDOMEN, AND PELVIS WITH CONTRAST  TECHNIQUE: Multidetector CT imaging of the chest, abdomen and pelvis was performed following the standard protocol during bolus administration of intravenous contrast.  CONTRAST:  131mL OMNIPAQUE IOHEXOL 300 MG/ML  SOLN  COMPARISON:  None.  FINDINGS: CT CHEST FINDINGS  Cardiovascular:  Heart:  Surgical changes of median sternotomy and CABG. Native coronary calcifications  Aorta:  Unremarkable course, caliber, contour of the thoracic aorta. No aneurysm or dissection flap. No periaortic fluid. Calcifications of the aortic arch. Branch vessels are patent. Descending thoracic aorta with mild atherosclerotic changes.  Pulmonary arteries:  Study is not tailored for the most sensitive evaluation of the pulmonary arteries, however, no central filling defects identified.  Mediastinum/Nodes: Small lymph nodes of the mediastinum. Calcified lymph nodes in the subcarinal and left hilar nodal stations. Unremarkable appearance of the thoracic esophagus.  Unremarkable appearance of the thoracic inlet.  Lungs/Pleura: Central airways are clear. No pleural effusion. No confluent airspace disease.  No pneumothorax.  Musculoskeletal: No acute displaced fracture. Degenerative changes of the spine.  Review of the  MIP images confirms the above findings.  CT ABDOMEN PELVIS FINDINGS  Hepatobiliary: Unremarkable appearance of liver. Unremarkable gallbladder  Pancreas: Unremarkable pancreas  Spleen: Punctate calcifications throughout the spleen, compatible with prior granulomatous disease.  Adrenals/Urinary Tract: Unremarkable appearance of the adrenal glands. No evidence of hydronephrosis of the right or left kidney. No nephrolithiasis. Unremarkable course of the bilateral ureters. Unremarkable appearance of the urinary bladder.  Stomach/Bowel: Unremarkable stomach. Unremarkable small bowel. Appendectomy. Moderate stool burden. Colonic diverticular disease without evidence of acute inflammation.  Vascular/Lymphatic: Atherosclerotic changes of the abdominal aorta. Mesenteric arteries and bilateral renal arteries are patent. Bilateral iliac arteries and proximal femoral arteries patent.  Reproductive: Diameter of the prostate measures 4.4 cm  Other: None  Musculoskeletal: No acute displaced fracture.  Degenerative changes.  IMPRESSION: No acute CT finding.  No finding to account for weight loss.  Surgical changes of median sternotomy and CABG.  Aortic Atherosclerosis (ICD10-I70.0).  Diverticular disease without evidence of acute diverticulitis.  Electronically Signed   By: Corrie Mckusick D.O.   On: 05/06/2018 09:57 CT Chest W Contrast CLINICAL DATA:  58 year old male with a history of unexplained weight loss  EXAM: CT CHEST, ABDOMEN, AND PELVIS WITH CONTRAST  TECHNIQUE: Multidetector CT imaging of the chest, abdomen and pelvis was performed following the standard protocol during bolus administration of intravenous contrast.  CONTRAST:  167mL OMNIPAQUE IOHEXOL 300 MG/ML  SOLN  COMPARISON:  None.  FINDINGS: CT CHEST FINDINGS  Cardiovascular:  Heart:  Surgical changes of median sternotomy and CABG. Native coronary calcifications  Aorta:  Unremarkable course,  caliber, contour of the thoracic aorta. No aneurysm or dissection flap. No periaortic fluid. Calcifications of the aortic arch. Branch vessels are patent. Descending thoracic aorta with mild atherosclerotic changes.  Pulmonary arteries:  Study is not tailored for the most sensitive evaluation of the pulmonary arteries, however, no central filling defects identified.  Mediastinum/Nodes: Small lymph nodes of the mediastinum. Calcified lymph nodes in the subcarinal and left hilar nodal stations. Unremarkable appearance of the thoracic esophagus.  Unremarkable appearance of the thoracic inlet.  Lungs/Pleura: Central airways are clear. No pleural effusion. No confluent airspace disease.  No pneumothorax.  Musculoskeletal: No acute displaced fracture. Degenerative changes of the spine.  Review of the MIP images confirms the above findings.  CT ABDOMEN PELVIS FINDINGS  Hepatobiliary: Unremarkable appearance of liver. Unremarkable gallbladder  Pancreas: Unremarkable pancreas  Spleen: Punctate calcifications throughout the spleen, compatible with prior granulomatous disease.  Adrenals/Urinary Tract: Unremarkable appearance of the adrenal glands. No evidence of hydronephrosis of the right or left kidney. No nephrolithiasis. Unremarkable course of the bilateral ureters. Unremarkable appearance of the urinary bladder.  Stomach/Bowel: Unremarkable stomach. Unremarkable small  bowel. Appendectomy. Moderate stool burden. Colonic diverticular disease without evidence of acute inflammation.  Vascular/Lymphatic: Atherosclerotic changes of the abdominal aorta. Mesenteric arteries and bilateral renal arteries are patent. Bilateral iliac arteries and proximal femoral arteries patent.  Reproductive: Diameter of the prostate measures 4.4 cm  Other: None  Musculoskeletal: No acute displaced fracture.  Degenerative changes.  IMPRESSION: No acute CT finding.  No finding to account for  weight loss.  Surgical changes of median sternotomy and CABG.  Aortic Atherosclerosis (ICD10-I70.0).  Diverticular disease without evidence of acute diverticulitis.  Electronically Signed   By: Corrie Mckusick D.O.   On: 05/06/2018 09:57  Assessment  The primary encounter diagnosis was Lumbar radiculopathy. Diagnoses of Lumbar facet arthropathy, Lumbar degenerative disc disease, Chronic midline low back pain without sciatica, Piriformis muscle pain, and Chronic pain syndrome were also pertinent to this visit.  Plan of Care  I have discontinued Jlon K. Tinoco "Kevin"'s Buprenorphine HCl. I am also having him start on Buprenorphine HCl. Additionally, I am having him maintain his MULTIPLE VITAMINS PO, aspirin EC, metoprolol tartrate, amLODipine, testosterone, zolpidem, rosuvastatin, traMADol, and Testosterone.  Pharmacotherapy (Medications Ordered): Meds ordered this encounter  Medications  . Buprenorphine HCl (BELBUCA) 600 MCG FILM    Sig: Place 1 Film inside cheek every 12 (twelve) hours for 30 days.    Dispense:  60 each    Refill:  0   Orders:  No orders of the defined types were placed in this encounter.  Follow-up plan:   Return in about 4 weeks (around 03/29/2019) for Medication Management.    I discussed the assessment and treatment plan with the Shawn Crawford. The Shawn Crawford was provided an opportunity to ask questions and all were answered. The Shawn Crawford agreed with the plan and demonstrated an understanding of the instructions.  Shawn Crawford advised to call back or seek an in-person evaluation if the symptoms or condition worsens.  Total duration of non-face-to-face encounter: 25 minutes.  Note by: Gillis Santa, MD Date: 03/01/2019; Time: 10:40 AM  Note: This dictation was prepared with Dragon dictation. Any transcriptional errors that may result from this process are unintentional.  Disclaimer:  * Given the special circumstances of the COVID-19 pandemic, the federal government has  announced that the Office for Civil Rights (OCR) will exercise its enforcement discretion and will not impose penalties on physicians using telehealth in the event of noncompliance with regulatory requirements under the [place] (HIPAA) in connection with the good faith provision of telehealth during the IFOYD-74 national public health emergency. ([place])

## 2019-03-02 ENCOUNTER — Other Ambulatory Visit: Payer: Self-pay | Admitting: Student in an Organized Health Care Education/Training Program

## 2019-03-29 ENCOUNTER — Ambulatory Visit
Payer: 59 | Attending: Student in an Organized Health Care Education/Training Program | Admitting: Student in an Organized Health Care Education/Training Program

## 2019-03-29 ENCOUNTER — Other Ambulatory Visit: Payer: Self-pay

## 2019-03-29 ENCOUNTER — Encounter: Payer: Self-pay | Admitting: Student in an Organized Health Care Education/Training Program

## 2019-03-29 VITALS — BP 127/83 | HR 76 | Temp 98.4°F | Ht 71.0 in | Wt 162.0 lb

## 2019-03-29 DIAGNOSIS — M545 Low back pain, unspecified: Secondary | ICD-10-CM

## 2019-03-29 DIAGNOSIS — M5416 Radiculopathy, lumbar region: Secondary | ICD-10-CM | POA: Diagnosis not present

## 2019-03-29 DIAGNOSIS — M7918 Myalgia, other site: Secondary | ICD-10-CM | POA: Diagnosis present

## 2019-03-29 DIAGNOSIS — G894 Chronic pain syndrome: Secondary | ICD-10-CM | POA: Insufficient documentation

## 2019-03-29 DIAGNOSIS — M5136 Other intervertebral disc degeneration, lumbar region: Secondary | ICD-10-CM | POA: Diagnosis not present

## 2019-03-29 DIAGNOSIS — G8929 Other chronic pain: Secondary | ICD-10-CM | POA: Insufficient documentation

## 2019-03-29 DIAGNOSIS — M47816 Spondylosis without myelopathy or radiculopathy, lumbar region: Secondary | ICD-10-CM

## 2019-03-29 MED ORDER — TAPENTADOL HCL 50 MG PO TABS: 50.0000 mg | ORAL_TABLET | Freq: Three times a day (TID) | ORAL | 0 refills | Status: DC | PRN

## 2019-03-29 NOTE — Progress Notes (Signed)
Nursing Pain Medication Assessment:  Safety precautions to be maintained throughout the outpatient stay will include: orient to surroundings, keep bed in low position, maintain call bell within reach at all times, provide assistance with transfer out of bed and ambulation.  Medication Inspection Compliance: Pill count conducted under aseptic conditions, in front of the patient. Neither the pills nor the bottle was removed from the patient's sight at any time. Once count was completed pills were immediately returned to the patient in their original bottle.  Medication: Buprenorphine (Suboxone) Pill/Patch Count: 17 patches Pill/Patch Appearance: Markings consistent with prescribed medication Bottle Appearance: Standard pharmacy container. Clearly labeled. Filled Date: ?? Last Medication intake:  Today

## 2019-03-29 NOTE — Patient Instructions (Signed)
450 mcg Belbuca twice daily Then once daily Then 150 mcg twice daily Then once daily Then OFF

## 2019-03-29 NOTE — Progress Notes (Signed)
Patient's Name: Shawn Crawford  MRN: [id number]  Referring Provider: Jerrol Banana.,*  DOB: 1961/08/10  PCP: Jerrol Banana., MD  DOS: 03/29/2019  Note by: Gillis Santa, MD  Service setting: Ambulatory outpatient  Attending: Gillis Santa, MD  Location: [facility] (AMB) Pain Management Facility  Specialty: Interventional Pain Management  Patient type: Established   Primary Reason(s) for Visit: Encounter for prescription drug management. (Level of risk: moderate)  CC: Back Pain  HPI  Shawn Crawford is a 58 y.o. year old, male patient, who comes today for a medication management evaluation. He has Abnormal blood sugar; Arteriosclerosis of coronary artery; ED (erectile dysfunction) of organic origin; Acid reflux; HLD (hyperlipidemia); BP (high blood pressure); Lumbar radiculopathy; Avitaminosis D; Hyperglycemia; Benign essential HTN; Chronic pain syndrome; Chronic midline low back pain without sciatica; Lumbar facet arthropathy; Lumbar degenerative disc disease; Piriformis muscle pain; Special screening for malignant neoplasms, colon; Benign neoplasm of descending colon; Diverticulosis of large intestine without diverticulitis; Anal fissure s/p partial internal anal sphincterotomy 11/11/2018; Melanosis of colon; and Internal hemorrhoids on their problem list. His primarily concern today is the Back Pain  Pain Assessment: Location: Right, Left Back Radiating: Pain radiaites down back of both leg to feet at times Onset: More than a month ago Duration: Chronic pain Quality: Aching Severity: 3 /10 (subjective, self-reported pain score)  Note: Reported level is compatible with observation.                         When using our objective Pain Scale, levels between 6 and 10/10 are said to belong in an emergency room, as it progressively worsens from a 6/10, described as severely limiting, requiring emergency care not usually available at an outpatient pain management facility. At a 6/10 level,  communication becomes difficult and requires great effort. Assistance to reach the emergency department may be required. Facial flushing and profuse sweating along with potentially dangerous increases in heart rate and blood pressure will be evident. Effect on ADL: limts my daily activities Timing: Constant Modifying factors: lay down on my side and put a pillow between my legs BP: 127/83  HR: 76  Shawn Crawford was last scheduled for an appointment on 03/02/2019 for medication management. During today's appointment we reviewed Shawn Crawford chronic pain status, as well as his outpatient medication regimen.  The patient  reports no history of drug use. His body mass index is 22.59 kg/m.  Further details on both, my assessment(s), as well as the proposed treatment plan, please see below.  Controlled Substance Pharmacotherapy Assessment REMS (Risk Evaluation and Mitigation Strategy)   03/02/2019  3   03/01/2019  Belbuca 600 MCG Film  60.00 30 Bi Lat   [id number]   Thr (4878)   0  1.20 mg  Comm Ins   [place]    Chauncey Fischer, RN  03/29/2019  8:55 AM  Sign when Signing Visit Nursing Pain Medication Assessment:  Safety precautions to be maintained throughout the outpatient stay will include: orient to surroundings, keep bed in low position, maintain call bell within reach at all times, provide assistance with transfer out of bed and ambulation.  Medication Inspection Compliance: Pill count conducted under aseptic conditions, in front of the patient. Neither the pills nor the bottle was removed from the patient's sight at any time. Once count was completed pills were immediately returned to the patient in their original bottle.  Medication: Buprenorphine (Suboxone) Pill/Patch Count: 17 patches Pill/Patch Appearance: Markings  consistent with prescribed medication Bottle Appearance: Standard pharmacy container. Clearly labeled. Filled Date: ?? Last Medication intake:  Today Pharmacokinetics: Liberation and  absorption (onset of action): WNL Distribution (time to peak effect): WNL Metabolism and excretion (duration of action): WNL         Pharmacodynamics: Desired effects: Analgesia: Shawn Crawford reports <50% benefit. Functional ability: Patient reports that medication does help, but not nearly as much as he would like Clinically meaningful improvement in function (CMIF): Medication does not meet basic CMIF Perceived effectiveness: Described as ineffective and would like to make some changes Undesirable effects: Side-effects or Adverse reactions: None reported Monitoring: [place] PMP: PDMP not reviewed this encounter. Online review of the past 7-monthperiod conducted. Compliant with practice rules and regulations Last UDS on record: Summary  Date Value Ref Range Status  08/03/2018 FINAL  Final    Comment:    ==================================================================== TOXASSURE SELECT 13 (MW) ==================================================================== Test                             Result       Flag       Units Drug Present and Declared for Prescription Verification   Tramadol                       >2174        EXPECTED   ng/mg creat   O-Desmethyltramadol            >2174        EXPECTED   ng/mg creat   N-Desmethyltramadol            1101         EXPECTED   ng/mg creat    Source of tramadol is a prescription medication.    O-desmethyltramadol and N-desmethyltramadol are expected    metabolites of tramadol. Drug Present not Declared for Prescription Verification   Tapentadol                     191          UNEXPECTED ng/mg creat    Source of tapentadol is a scheduled prescription medication. ==================================================================== Test                      Result    Flag   Units      Ref Range   Creatinine              230              mg/dL      >=20 ==================================================================== Declared Medications:   The flagging and interpretation on this report are based on the  following declared medications.  Unexpected results may arise from  inaccuracies in the declared medications.  **Note: The testing scope of this panel includes these medications:  Tramadol (Ultram)  **Note: The testing scope of this panel does not include following  reported medications:  Amlodipine (Norvasc)  Aspirin (Aspirin 81)  Fexofenadine (Allegra)  Hydrocortisone (Anusol HC)  Metoprolol (Lopressor)  Multivitamin  Omega-3 Fatty Acids (Fish Oil)  Rosuvastatin (Crestor)  Ubiquinone (CoQ10)  Zolpidem (Ambien) ==================================================================== For clinical consultation, please call [phone] ====================================================================    UDS interpretation: Compliant          Medication Assessment Form: Reviewed. Patient indicates being compliant with therapy Treatment compliance: Compliant Risk Assessment Profile: Aberrant behavior: See initial evaluations. None observed or detected today  Comorbid factors increasing risk of overdose: See initial evaluation. No additional risks detected today Opioid risk tool (ORT):  Opioid Risk  08/03/2018  Alcohol 0  Illegal Drugs 0  Rx Drugs 0  Alcohol 0  Illegal Drugs 0  Rx Drugs 0  Age between 16-45 years  0  History of Preadolescent Sexual Abuse 0  Psychological Disease 0  Depression 0  Opioid Risk Tool Scoring 0  Opioid Risk Interpretation Low Risk    ORT Scoring interpretation table:  Score <3 = Low Risk for SUD  Score between 4-7 = Moderate Risk for SUD  Score >8 = High Risk for Opioid Abuse   Risk of substance use disorder (SUD): Low  Risk Mitigation Strategies:  Patient Counseling: Covered Patient-Prescriber Agreement (PPA): Present and active  Notification to other healthcare providers: Done  Pharmacologic Plan: Patient has been on belbuca for many months and we have even tried  increasing his dose to achieve better pain control.  Patient feels that his current dose of belbuca which we have increased to 600 mcg twice daily, he is not obtaining significant pain relief.  He would like to consider going back on Nucynta which he was on previously at 50 mg 3 times a day.  This is reasonable.  I have provided the patient wean instructions on his belbuca which are in the AVS instructions.             Laboratory Chemistry   SAFETY SCREENING Profile No results found for: [place], COVIDSOURCE, STAPHAUREUS, MRSAPCR, HCVAB, HIV, PREGTESTUR Inflammation Markers (CRP: Acute Phase) (ESR: Chronic Phase) Lab Results  Component Value Date   ESRSEDRATE 2 08/06/2018                         Rheumatology Markers Lab Results  Component Value Date   ANA Negative 08/06/2018   LABURIC 7.5 07/08/2017   HLAB27 Negative 05/12/2017                        Renal Function Markers Lab Results  Component Value Date   BUN 16 11/08/2018   CREATININE 1.11 11/08/2018   BCR 11 08/06/2018   GFRAA >60 11/08/2018   GFRNONAA >60 11/08/2018                             Hepatic Function Markers Lab Results  Component Value Date   AST 33 08/06/2018   ALT 40 08/06/2018   ALBUMIN 5.2 08/06/2018   ALKPHOS 69 08/06/2018                        Electrolytes Lab Results  Component Value Date   NA 142 11/08/2018   K 4.3 11/08/2018   CL 105 11/08/2018   CALCIUM 9.3 11/08/2018                        Neuropathy Markers Lab Results  Component Value Date   HGBA1C 5.8 (H) 02/12/2017                        CNS Tests No results found for: COLORCSF, APPEARCSF, RBCCOUNTCSF, WBCCSF, POLYSCSF, LYMPHSCSF, EOSCSF, PROTEINCSF, GLUCCSF, JCVIRUS, CSFOLI, IGGCSF, LABACHR, ACETBL                      Bone Pathology Markers Lab Results  Component Value Date   TESTOFREE 4.6 (L) 11/25/2017   TESTOSTERONE 226 (L) 08/06/2018                         Coagulation Parameters Lab Results  Component Value  Date   PLT 172 11/08/2018                        Cardiovascular Markers Lab Results  Component Value Date   CKTOTAL 75 08/06/2018   HGB 13.5 11/08/2018   HCT 40.7 11/08/2018                         ID Test(s) No results found for: LYMEIGGIGMAB, HIV, SARSCOV2NAA, STAPHAUREUS, MRSAPCR, HCVAB, PREGTESTUR, MICROTEXT  CA Markers No results found for: CEA, CA125, LABCA2                      Endocrine Markers Lab Results  Component Value Date   TSH 1.660 04/22/2018   TESTOFREE 4.6 (L) 11/25/2017   TESTOSTERONE 226 (L) 08/06/2018                        Note: Lab results reviewed.  Recent Diagnostic Imaging Results  CT Abdomen Pelvis W Contrast CLINICAL DATA:  58 year old male with a history of unexplained weight loss  EXAM: CT CHEST, ABDOMEN, AND PELVIS WITH CONTRAST  TECHNIQUE: Multidetector CT imaging of the chest, abdomen and pelvis was performed following the standard protocol during bolus administration of intravenous contrast.  CONTRAST:  137m OMNIPAQUE IOHEXOL 300 MG/ML  SOLN  COMPARISON:  None.  FINDINGS: CT CHEST FINDINGS  Cardiovascular:  Heart:  Surgical changes of median sternotomy and CABG. Native coronary calcifications  Aorta:  Unremarkable course, caliber, contour of the thoracic aorta. No aneurysm or dissection flap. No periaortic fluid. Calcifications of the aortic arch. Branch vessels are patent. Descending thoracic aorta with mild atherosclerotic changes.  Pulmonary arteries:  Study is not tailored for the most sensitive evaluation of the pulmonary arteries, however, no central filling defects identified.  Mediastinum/Nodes: Small lymph nodes of the mediastinum. Calcified lymph nodes in the subcarinal and left hilar nodal stations. Unremarkable appearance of the thoracic esophagus.  Unremarkable appearance of the thoracic inlet.  Lungs/Pleura: Central airways are clear. No pleural effusion. No confluent airspace disease.  No  pneumothorax.  Musculoskeletal: No acute displaced fracture. Degenerative changes of the spine.  Review of the MIP images confirms the above findings.  CT ABDOMEN PELVIS FINDINGS  Hepatobiliary: Unremarkable appearance of liver. Unremarkable gallbladder  Pancreas: Unremarkable pancreas  Spleen: Punctate calcifications throughout the spleen, compatible with prior granulomatous disease.  Adrenals/Urinary Tract: Unremarkable appearance of the adrenal glands. No evidence of hydronephrosis of the right or left kidney. No nephrolithiasis. Unremarkable course of the bilateral ureters. Unremarkable appearance of the urinary bladder.  Stomach/Bowel: Unremarkable stomach. Unremarkable small bowel. Appendectomy. Moderate stool burden. Colonic diverticular disease without evidence of acute inflammation.  Vascular/Lymphatic: Atherosclerotic changes of the abdominal aorta. Mesenteric arteries and bilateral renal arteries are patent. Bilateral iliac arteries and proximal femoral arteries patent.  Reproductive: Diameter of the prostate measures 4.4 cm  Other: None  Musculoskeletal: No acute displaced fracture.  Degenerative changes.  IMPRESSION: No acute CT finding.  No finding to account for weight loss.  Surgical changes of median sternotomy and CABG.  Aortic Atherosclerosis (ICD10-I70.0).  Diverticular disease without evidence of acute diverticulitis.  Electronically Signed  By: Corrie Mckusick D.O.   On: 05/06/2018 09:57 CT Chest W Contrast CLINICAL DATA:  58 year old male with a history of unexplained weight loss  EXAM: CT CHEST, ABDOMEN, AND PELVIS WITH CONTRAST  TECHNIQUE: Multidetector CT imaging of the chest, abdomen and pelvis was performed following the standard protocol during bolus administration of intravenous contrast.  CONTRAST:  153m OMNIPAQUE IOHEXOL 300 MG/ML  SOLN  COMPARISON:  None.  FINDINGS: CT CHEST  FINDINGS  Cardiovascular:  Heart:  Surgical changes of median sternotomy and CABG. Native coronary calcifications  Aorta:  Unremarkable course, caliber, contour of the thoracic aorta. No aneurysm or dissection flap. No periaortic fluid. Calcifications of the aortic arch. Branch vessels are patent. Descending thoracic aorta with mild atherosclerotic changes.  Pulmonary arteries:  Study is not tailored for the most sensitive evaluation of the pulmonary arteries, however, no central filling defects identified.  Mediastinum/Nodes: Small lymph nodes of the mediastinum. Calcified lymph nodes in the subcarinal and left hilar nodal stations. Unremarkable appearance of the thoracic esophagus.  Unremarkable appearance of the thoracic inlet.  Lungs/Pleura: Central airways are clear. No pleural effusion. No confluent airspace disease.  No pneumothorax.  Musculoskeletal: No acute displaced fracture. Degenerative changes of the spine.  Review of the MIP images confirms the above findings.  CT ABDOMEN PELVIS FINDINGS  Hepatobiliary: Unremarkable appearance of liver. Unremarkable gallbladder  Pancreas: Unremarkable pancreas  Spleen: Punctate calcifications throughout the spleen, compatible with prior granulomatous disease.  Adrenals/Urinary Tract: Unremarkable appearance of the adrenal glands. No evidence of hydronephrosis of the right or left kidney. No nephrolithiasis. Unremarkable course of the bilateral ureters. Unremarkable appearance of the urinary bladder.  Stomach/Bowel: Unremarkable stomach. Unremarkable small bowel. Appendectomy. Moderate stool burden. Colonic diverticular disease without evidence of acute inflammation.  Vascular/Lymphatic: Atherosclerotic changes of the abdominal aorta. Mesenteric arteries and bilateral renal arteries are patent. Bilateral iliac arteries and proximal femoral arteries patent.  Reproductive: Diameter of the prostate measures 4.4  cm  Other: None  Musculoskeletal: No acute displaced fracture.  Degenerative changes.  IMPRESSION: No acute CT finding.  No finding to account for weight loss.  Surgical changes of median sternotomy and CABG.  Aortic Atherosclerosis (ICD10-I70.0).  Diverticular disease without evidence of acute diverticulitis.  Electronically Signed   By: JCorrie MckusickD.O.   On: 05/06/2018 09:57  Complexity Note: Imaging results reviewed. Results shared with Mr. DVillavicencio using Layman's terms.                               Meds   Current Outpatient Medications:  .  amLODipine (NORVASC) 5 MG tablet, Take 1 tablet (5 mg total) by mouth daily., Disp: 90 tablet, Rfl: 3 .  aspirin EC 81 MG tablet, Take 81 mg by mouth daily., Disp: , Rfl:  .  metoprolol tartrate (LOPRESSOR) 25 MG tablet, Take 0.5 tablets (12.5 mg total) by mouth 2 (two) times daily. (Patient taking differently: Take 25 mg by mouth 2 (two) times daily. ), Disp: 60 tablet, Rfl: 1 .  MULTIPLE VITAMINS PO, Take 1 tablet by mouth daily. , Disp: , Rfl:  .  rosuvastatin (CRESTOR) 40 MG tablet, TAKE 1 TABLET BY MOUTH  DAILY, Disp: 90 tablet, Rfl: 3 .  Testosterone 1.62 % GEL, APPLY TWO PUMPS ONTO THE SKIN ONCE DAILY, Disp: 75 g, Rfl: 5 .  zolpidem (AMBIEN CR) 12.5 MG CR tablet, Take 1 tablet (12.5 mg total) by mouth at bedtime as needed for sleep.  Reviewed PMP Aware Alerts and stopped the Zolpidem 10 mg. (Patient taking differently: Take 12.5 mg by mouth at bedtime. ), Disp: 30 tablet, Rfl: 5 .  tapentadol (NUCYNTA) 50 MG tablet, Take 1 tablet (50 mg total) by mouth every 8 (eight) hours as needed for moderate pain or severe pain., Disp: 90 tablet, Rfl: 0 .  [START ON 04/28/2019] tapentadol (NUCYNTA) 50 MG tablet, Take 1 tablet (50 mg total) by mouth every 8 (eight) hours as needed for moderate pain or severe pain., Disp: 90 tablet, Rfl: 0 .  testosterone (TESTIM) 50 MG/5GM (1%) GEL, Place 5 g onto the skin daily., Disp: 30 Tube, Rfl: 5  ROS   Constitutional: Denies any fever or chills Gastrointestinal: No reported hemesis, hematochezia, vomiting, or acute GI distress Musculoskeletal: Denies any acute onset joint swelling, redness, loss of ROM, or weakness Neurological: No reported episodes of acute onset apraxia, aphasia, dysarthria, agnosia, amnesia, paralysis, loss of coordination, or loss of consciousness  Allergies  Shawn Crawford is allergic to penicillins.  [place]  Drug: Shawn Crawford  reports no history of drug use. Alcohol:  reports current alcohol use. Tobacco:  reports that he has never smoked. He has never used smokeless tobacco. Medical:  has a past medical history of cardiac catheterization, Hyperlipidemia, and Hypertension. Surgical: Shawn Crawford  has a past surgical history that includes Coronary artery bypass graft; Hernia repair; Appendectomy; Cardiac catheterization; Colonoscopy with propofol (N/A, 06/24/2018); Esophagogastroduodenoscopy (egd) with propofol (N/A, 06/24/2018); and Exam under anesthesia with hemorrhoidectomy (N/A, 11/11/2018). Family: family history includes Colon cancer in his sister; Diabetes in his father; Hyperlipidemia in his mother; Hypertension in his father; Thyroid cancer in his sister.  Constitutional Exam  General appearance: Well nourished, well developed, and well hydrated. In no apparent acute distress Vitals:   03/29/19 0841  BP: 127/83  Pulse: 76  Temp: 98.4 F (36.9 C)  SpO2: 100%  Weight: 162 lb (73.5 kg)  Height: 5' 11" (1.803 m)   BMI Assessment: Estimated body mass index is 22.59 kg/m as calculated from the following:   Height as of this encounter: 5' 11" (1.803 m).   Weight as of this encounter: 162 lb (73.5 kg).  BMI interpretation table: BMI level Category Range association with higher incidence of chronic pain  <18 kg/m2 Underweight   18.5-24.9 kg/m2 Ideal body weight   25-29.9 kg/m2 Overweight Increased incidence by 20%  30-34.9 kg/m2 Obese (Class I) Increased incidence by 68%   35-39.9 kg/m2 Severe obesity (Class II) Increased incidence by 136%  >40 kg/m2 Extreme obesity (Class III) Increased incidence by 254%   Patient's current BMI Ideal Body weight  Body mass index is 22.59 kg/m. Ideal body weight: 75.3 kg (166 lb 0.1 oz)   BMI Readings from Last 4 Encounters:  03/29/19 22.59 kg/m  12/13/18 23.43 kg/m  11/24/18 23.46 kg/m  11/18/18 22.59 kg/m   Wt Readings from Last 4 Encounters:  03/29/19 162 lb (73.5 kg)  12/13/18 168 lb (76.2 kg)  11/24/18 168 lb 3.2 oz (76.3 kg)  11/18/18 162 lb (73.5 kg)  Psych/Mental status: Alert, oriented x 3 (person, place, & time)       Eyes: PERLA Respiratory: No evidence of acute respiratory distress  Cervical Spine Area Exam  Skin & Axial Inspection: No masses, redness, edema, swelling, or associated skin lesions Alignment: Symmetrical Functional ROM: Unrestricted ROM      Stability: No instability detected Muscle Tone/Strength: Functionally intact. No obvious neuro-muscular anomalies detected. Sensory (Neurological): Unimpaired Palpation: No palpable anomalies  Lumbar Spine Area Exam  Skin & Axial Inspection: No masses, redness, or swelling Alignment: Symmetrical Functional ROM: Unrestricted ROM       Stability: No instability detected Muscle Tone/Strength: Functionally intact. No obvious neuro-muscular anomalies detected. Sensory (Neurological): Musculoskeletal pain pattern  Gait & Posture Assessment  Ambulation: Unassisted Gait: Relatively normal for age and body habitus Posture: WNL   Lower Extremity Exam    Side: Right lower extremity  Side: Left lower extremity  Stability: No instability observed          Stability: No instability observed          Skin & Extremity Inspection: Skin color, temperature, and hair growth are WNL. No peripheral edema or cyanosis. No masses, redness, swelling, asymmetry, or associated skin lesions. No contractures.  Skin & Extremity Inspection: Skin color,  temperature, and hair growth are WNL. No peripheral edema or cyanosis. No masses, redness, swelling, asymmetry, or associated skin lesions. No contractures.  Functional ROM: Unrestricted ROM                  Functional ROM: Unrestricted ROM                  Muscle Tone/Strength: Functionally intact. No obvious neuro-muscular anomalies detected.  Muscle Tone/Strength: Functionally intact. No obvious neuro-muscular anomalies detected.  Sensory (Neurological): Unimpaired        Sensory (Neurological): Unimpaired        DTR: Patellar: deferred today Achilles: deferred today Plantar: deferred today  DTR: Patellar: deferred today Achilles: deferred today Plantar: deferred today  Palpation: No palpable anomalies  Palpation: No palpable anomalies   Assessment   Status Diagnosis  Persistent Persistent Persistent 1. Lumbar radiculopathy   2. Lumbar facet arthropathy   3. Lumbar degenerative disc disease   4. Chronic midline low back pain without sciatica   5. Piriformis muscle pain   6. Chronic pain syndrome      Since patient is obtaining suboptimal pain relief when his current belbuca analgesic regiment.  We discussed discontinuing.  Instructed patient to take 450 mcg twice daily for 1 day then 4 and 50 mcg daily followed by 150 mcg twice daily followed by 150 mg once daily until off.  We will re-prescribe Nucynta which patient was on previously at 50 mg 3 times daily as below.  Patient follow-up in 2 months.  Plan of Care  Pharmacotherapy (Medications Ordered): Meds ordered this encounter  Medications  . tapentadol (NUCYNTA) 50 MG tablet    Sig: Take 1 tablet (50 mg total) by mouth every 8 (eight) hours as needed for moderate pain or severe pain.    Dispense:  90 tablet    Refill:  0    [place] STOP ACT - Not applicable. Fill one day early if pharmacy is closed on scheduled refill date.  . tapentadol (NUCYNTA) 50 MG tablet    Sig: Take 1 tablet (50 mg total) by mouth every 8 (eight) hours  as needed for moderate pain or severe pain.    Dispense:  90 tablet    Refill:  0    [place] STOP ACT - Not applicable. Fill one day early if pharmacy is closed on scheduled refill date.    Planned follow-up:   Return in about 8 weeks (around 05/24/2019) for Medication Management.    Recent Visits Date Type Provider Dept  03/01/19 Office Visit Gillis Santa, MD [facility]  01/05/19 Office Visit Gillis Santa, MD [facility]  Showing recent visits  within past 90 days and meeting all other requirements   Today's Visits Date Type Provider Dept  03/29/19 Office Visit Gillis Santa, MD [facility]  Showing today's visits and meeting all other requirements   Future Appointments Date Type Provider Dept  05/24/19 Appointment Gillis Santa, MD [facility]  Showing future appointments within next 90 days and meeting all other requirements   Primary Care Physician: Jerrol Banana., MD Location: [facility] Note by: Gillis Santa, MD Date: 03/29/2019; Time: 10:47 AM  Note: This dictation was prepared with Dragon dictation. Any transcriptional errors that may result from this process are unintentional.

## 2019-04-05 ENCOUNTER — Other Ambulatory Visit: Payer: Self-pay | Admitting: Family Medicine

## 2019-04-05 DIAGNOSIS — I10 Essential (primary) hypertension: Secondary | ICD-10-CM

## 2019-04-11 ENCOUNTER — Telehealth: Payer: Self-pay | Admitting: Student in an Organized Health Care Education/Training Program

## 2019-04-11 NOTE — Telephone Encounter (Signed)
Scheduled patient for appt on Thursday 04-14-19

## 2019-04-11 NOTE — Telephone Encounter (Signed)
Patient lvmail asking to be taken off new medication Nucynta and put back on what he was taking.

## 2019-04-13 ENCOUNTER — Encounter: Payer: Self-pay | Admitting: Family Medicine

## 2019-04-13 ENCOUNTER — Encounter: Payer: Self-pay | Admitting: Student in an Organized Health Care Education/Training Program

## 2019-04-13 ENCOUNTER — Other Ambulatory Visit: Payer: Self-pay

## 2019-04-13 ENCOUNTER — Ambulatory Visit (INDEPENDENT_AMBULATORY_CARE_PROVIDER_SITE_OTHER): Payer: 59 | Admitting: Family Medicine

## 2019-04-13 VITALS — BP 98/60 | HR 76 | Temp 98.9°F | Resp 16 | Ht 71.0 in | Wt 167.2 lb

## 2019-04-13 DIAGNOSIS — K602 Anal fissure, unspecified: Secondary | ICD-10-CM

## 2019-04-13 DIAGNOSIS — R739 Hyperglycemia, unspecified: Secondary | ICD-10-CM | POA: Diagnosis not present

## 2019-04-13 DIAGNOSIS — I2510 Atherosclerotic heart disease of native coronary artery without angina pectoris: Secondary | ICD-10-CM

## 2019-04-13 DIAGNOSIS — Z23 Encounter for immunization: Secondary | ICD-10-CM | POA: Diagnosis not present

## 2019-04-13 DIAGNOSIS — Z1211 Encounter for screening for malignant neoplasm of colon: Secondary | ICD-10-CM

## 2019-04-13 DIAGNOSIS — E291 Testicular hypofunction: Secondary | ICD-10-CM

## 2019-04-13 DIAGNOSIS — E785 Hyperlipidemia, unspecified: Secondary | ICD-10-CM

## 2019-04-13 DIAGNOSIS — Z0000 Encounter for general adult medical examination without abnormal findings: Secondary | ICD-10-CM | POA: Diagnosis not present

## 2019-04-13 DIAGNOSIS — I10 Essential (primary) hypertension: Secondary | ICD-10-CM | POA: Diagnosis not present

## 2019-04-13 NOTE — Progress Notes (Signed)
Patient: Shawn Crawford, Male    DOB: 22-Jul-1961, 58 y.o.   MRN: [id number] Visit Date: 04/13/2019  Today's Provider: [name], MD   Chief Complaint  Patient presents with  . Annual Exam   Subjective:    Annual physical exam Shawn Crawford is a 58 y.o. male who presents today for health maintenance and complete physical. He feels well. He reports exercising by walking daily. He reports he is sleeping well. He is divorced father of 3.  He has he has chronic back pain and his legs ache and feel heavy. No claudication symptoms. He sees Dr Holley Raring for pain,Dr [facility] for cardiology.  ---------------- -------------------------------------------------    Follow up for Low Testosterone  The patient was last seen for this 8 months ago. Changes made at last visit include none.  He reports excellent compliance with treatment. He feels that condition is Unchanged. He is not having side effects.   ------------------------------------------------------------------------------------ Last reported  Colonoscopy-06/24/18 Tdap-12/04/2008  Review of Systems  Social History He  reports that he has never smoked. He has never used smokeless tobacco. He reports current alcohol use. He reports that he does not use drugs. Social History   Socioeconomic History  . Marital status: Single    Spouse name: Not on file  . Number of children: Not on file  . Years of education: Not on file  . Highest education level: Not on file  Occupational History  . Not on file  Social Needs  . Financial resource strain: Not on file  . Food insecurity    Worry: Not on file    Inability: Not on file  . Transportation needs    Medical: Not on file    Non-medical: Not on file  Tobacco Use  . Smoking status: Never Smoker  . Smokeless tobacco: Never Used  Substance and Sexual Activity  . Alcohol use: Yes    Alcohol/week: 0.0 standard drinks    Comment: glass of wine occasionally   . Drug use: No  . Sexual activity: Not on file  Lifestyle  . Physical activity    Days per week: Not on file    Minutes per session: Not on file  . Stress: Not on file  Relationships  . Social Herbalist on phone: Not on file    Gets together: Not on file    Attends religious service: Not on file    Active member of club or organization: Not on file    Attends meetings of clubs or organizations: Not on file    Relationship status: Not on file  Other Topics Concern  . Not on file  Social History Narrative  . Not on file    Patient Active Problem List   Diagnosis Date Noted  . Special screening for malignant neoplasms, colon   . Benign neoplasm of descending colon   . Diverticulosis of large intestine without diverticulitis   . Anal fissure s/p partial internal anal sphincterotomy 11/11/2018   . Melanosis of colon   . Internal hemorrhoids   . Lumbar degenerative disc disease 04/06/2018  . Piriformis muscle pain 04/06/2018  . Chronic pain syndrome 10/22/2017  . Chronic midline low back pain without sciatica 10/22/2017  . Lumbar facet arthropathy 10/22/2017  . Hyperglycemia 05/30/2015  . Abnormal blood sugar 05/10/2015  . Arteriosclerosis of coronary artery 05/10/2015  . ED (erectile dysfunction) of organic origin 05/10/2015  . Acid reflux 05/10/2015  . HLD (hyperlipidemia)  05/10/2015  . BP (high blood pressure) 05/10/2015  . Lumbar radiculopathy 05/10/2015  . Avitaminosis D 05/10/2015  . Benign essential HTN 05/07/2015    Past Surgical History:  Procedure Laterality Date  . APPENDECTOMY    . CARDIAC CATHETERIZATION    . COLONOSCOPY WITH PROPOFOL N/A 06/24/2018   Procedure: COLONOSCOPY WITH PROPOFOL;  Surgeon: Virgel Manifold, MD;  Location: [facility] ENDOSCOPY;  Service: Endoscopy;  Laterality: N/A;  . CORONARY ARTERY BYPASS GRAFT     x 3  . ESOPHAGOGASTRODUODENOSCOPY (EGD) WITH PROPOFOL N/A 06/24/2018   Procedure: ESOPHAGOGASTRODUODENOSCOPY (EGD) WITH  PROPOFOL;  Surgeon: Virgel Manifold, MD;  Location: [facility] ENDOSCOPY;  Service: Endoscopy;  Laterality: N/A;  . EVALUATION UNDER ANESTHESIA WITH HEMORRHOIDECTOMY N/A 11/11/2018   Procedure: HEMORRHOIDAL LIGATION/PEXY, PARTIAL LATERAL SPINCTEROTOMY;  Surgeon: Michael Boston, MD;  Location: [facility];  Service: General;  Laterality: N/A;  . HERNIA REPAIR      Family History  Family Status  Relation Name Status  . Mother  Alive  . Father  Deceased  . Sister  Alive  . Son  Alive  . Sister  Alive  . Son  Alive   His family history includes Colon cancer in his sister; Diabetes in his father; Hyperlipidemia in his mother; Hypertension in his father; Thyroid cancer in his sister.     Allergies  Allergen Reactions  . Penicillins Itching and Other (See Comments)    Did it involve swelling of the face/tongue/throat, SOB, or low BP? No Did it involve sudden or severe rash/hives, skin peeling, or any reaction on the inside of your mouth or nose? No Did you need to seek medical attention at a hospital or doctor's office? Yes When did it last happen? Childhood allergy If all above answers are "NO", may proceed with cephalosporin use.     Previous Medications   AMLODIPINE (NORVASC) 5 MG TABLET    TAKE 1 TABLET BY MOUTH  DAILY   ASPIRIN EC 81 MG TABLET    Take 81 mg by mouth daily.   METOPROLOL TARTRATE (LOPRESSOR) 25 MG TABLET    Take 0.5 tablets (12.5 mg total) by mouth 2 (two) times daily.   MULTIPLE VITAMINS PO    Take 1 tablet by mouth daily.    ROSUVASTATIN (CRESTOR) 40 MG TABLET    TAKE 1 TABLET BY MOUTH  DAILY   TESTOSTERONE 1.62 % GEL    APPLY TWO PUMPS ONTO THE SKIN ONCE DAILY   ZOLPIDEM (AMBIEN CR) 12.5 MG CR TABLET    Take 1 tablet (12.5 mg total) by mouth at bedtime as needed for sleep. Reviewed PMP Aware Alerts and stopped the Zolpidem 10 mg.    Patient Care Team: Jerrol Banana., MD as PCP - General (Family Medicine) Michael Boston, MD as Consulting Physician (General  Surgery) Corey Skains, MD as Consulting Physician (Cardiology) Virgel Manifold, MD as Consulting Physician (Gastroenterology)      Objective:   Vitals: BP 98/60   Pulse 76   Temp 98.9 F (37.2 C) (Oral)   Resp 16   Ht 5\' 11"  (1.803 m)   Wt 167 lb 3.2 oz (75.8 kg)   SpO2 97%   BMI 23.32 kg/m    Physical Exam   Depression Screen PHQ 2/9 Scores 04/13/2019 04/13/2019 12/13/2018 11/24/2018  PHQ - 2 Score 0 0 0 0  PHQ- 9 Score 0 - - -      Assessment & Plan:     Routine Health Maintenance and  Physical Exam  Exercise Activities and Dietary recommendations Goals   None     Immunization History  Administered Date(s) Administered  . Influenza,inj,Quad PF,6+ Mos 05/30/2015, 06/16/2016, 05/27/2017, 08/04/2018  . Pneumococcal Conjugate-13 08/21/2015  . Tdap 12/04/2008    Health Maintenance  Topic Date Due  . PNEUMOCOCCAL POLYSACCHARIDE VACCINE AGE 62-64 HIGH RISK  05/17/1962  . FOOT EXAM  05/17/1970  . OPHTHALMOLOGY EXAM  05/17/1970  . URINE MICROALBUMIN  05/17/1970  . HIV Screening  05/18/1975  . HEMOGLOBIN A1C  08/15/2017  . TETANUS/TDAP  12/05/2018  . INFLUENZA VACCINE  04/30/2019  . COLONOSCOPY  06/25/2023  . Hepatitis C Screening  Completed     Discussed health benefits of physical activity, and encouraged him to engage in regular exercise appropriate for his age and condition.  1. Annual physical exam  - Comprehensive metabolic panel - CBC with Differential/Platelet - Lipid panel - TSH - PSA - Testosterone,Free and Total - Cortisol - Prolactin  2. Hypogonadism in male  - Testosterone,Free and Total - Cortisol - Prolactin  3. Benign essential HTN  - TSH  4. Hyperlipidemia, unspecified hyperlipidemia type  - Lipid panel  5. Hyperglycemia  - Comprehensive metabolic panel  6. Need for tetanus booster  - Td vaccine greater than or equal to 7yo preservative free IM  7. Screening for colon cancer  - Ambulatory referral to  Gastroenterology  8. Anal fissure s/p partial internal anal sphincterotomy 11/11/2018   9. Arteriosclerosis of coronary artery S/p CABG age 20.    -------------------------------------------------------------------- Clint Bolder as a scribe for [name], MD.,have documented all relevant documentation on the behalf of [name], MD,as directed by  [name], MD while in the presence of [name], MD. I have done the exam and reviewed the chart and it is accurate to the best of my knowledge. Development worker, community has been used and  any errors in dictation or transcription are unintentional. Miguel Aschoff M.D. [facility]

## 2019-04-14 ENCOUNTER — Ambulatory Visit
Payer: 59 | Attending: Student in an Organized Health Care Education/Training Program | Admitting: Student in an Organized Health Care Education/Training Program

## 2019-04-14 DIAGNOSIS — G8929 Other chronic pain: Secondary | ICD-10-CM

## 2019-04-14 DIAGNOSIS — M47816 Spondylosis without myelopathy or radiculopathy, lumbar region: Secondary | ICD-10-CM | POA: Diagnosis not present

## 2019-04-14 DIAGNOSIS — M7918 Myalgia, other site: Secondary | ICD-10-CM

## 2019-04-14 DIAGNOSIS — M545 Low back pain: Secondary | ICD-10-CM

## 2019-04-14 DIAGNOSIS — G894 Chronic pain syndrome: Secondary | ICD-10-CM

## 2019-04-14 DIAGNOSIS — M5416 Radiculopathy, lumbar region: Secondary | ICD-10-CM | POA: Diagnosis not present

## 2019-04-14 DIAGNOSIS — M5136 Other intervertebral disc degeneration, lumbar region: Secondary | ICD-10-CM | POA: Diagnosis not present

## 2019-04-14 MED ORDER — BELBUCA 600 MCG BU FILM: 1.0000 | ORAL_FILM | Freq: Two times a day (BID) | BUCCAL | 2 refills | Status: DC

## 2019-04-14 NOTE — Progress Notes (Signed)
Pain Management Virtual Encounter Note - Virtual Visit via Telephone Telehealth (real-time audio visits between healthcare provider and patient).   Patient's Phone No. & Preferred Pharmacy:  [phone] (home); [phone] (mobile); (Preferred) [phone] [email or web address]  [place], [place] - [address] [place] [address] Phone: [phone] Fax: [phone]  [place], [place] [facility] [address] [place] [address] [place] [address] Phone: [phone] Fax: [phone]    Pre-screening note:  Our staff contacted Mr. Si and offered him an "in person", "face-to-face" appointment versus a telephone encounter. He indicated preferring the telephone encounter, at this time.   Reason for Virtual Visit: COVID-19*  Social distancing based on CDC and AMA recommendations.   I contacted Gwendalyn Ege on 04/14/2019 via telephone.      I clearly identified myself as Gillis Santa, MD. I verified that I was speaking with the correct person using two identifiers (Name: Latoya Diskin, and date of birth: 15-Jan-1961).  Advanced Informed Consent I sought verbal advanced consent from Gwendalyn Ege for virtual visit interactions. I informed Mr. Marsolek of possible security and privacy concerns, risks, and limitations associated with providing "not-in-person" medical evaluation and management services. I also informed Mr. Eichhorst of the availability of "in-person" appointments. Finally, I informed him that there would be a charge for the virtual visit and that he could be  personally, fully or partially, financially responsible for it. Mr. Schlafer expressed understanding and agreed to proceed.   Historic Elements   Mr. Frederick Marro is a 58 y.o. year old, male patient evaluated today after his last encounter by our practice on 04/11/2019. Mr. Godeaux  has a past medical history of cardiac catheterization,  Hyperlipidemia, and Hypertension. He also  has a past surgical history that includes Coronary artery bypass graft; Hernia repair; Appendectomy; Cardiac catheterization; Colonoscopy with propofol (N/A, 06/24/2018); Esophagogastroduodenoscopy (egd) with propofol (N/A, 06/24/2018); and Exam under anesthesia with hemorrhoidectomy (N/A, 11/11/2018). Mr. Hamad has a current medication list which includes the following prescription(s): amlodipine, aspirin ec, metoprolol tartrate, multiple vitamin, rosuvastatin, testosterone, zolpidem, and belbuca. He  reports that he has never smoked. He has never used smokeless tobacco. He reports current alcohol use. He reports that he does not use drugs. Mr. Boule is allergic to penicillins.   HPI  Today, he is being contacted for medication management.   Patient having side effects with Nucynta and states that it was not helping his pain. States that he would like to go back to [place]. Only SE with Belbuca was leg heaviness and feeling fatigued.  Pharmacotherapy Assessment   03/02/2019  3   03/01/2019  Belbuca 600 MCG Film  60.00 30 Bi Lat   [id number]   Thr (4878)   0  1.20 mg  Comm Ins   [place]    Monitoring: Pharmacotherapy: No side-effects or adverse reactions reported. [place] PMP: PDMP reviewed during this encounter.       Compliance: No problems identified. Effectiveness: Clinically acceptable. Plan: Refer to "POC".  Pertinent Labs   SAFETY SCREENING Profile No results found for: SARSCOV2NAA, COVIDSOURCE, STAPHAUREUS, MRSAPCR, HCVAB, HIV, PREGTESTUR Renal Function Lab Results  Component Value Date   BUN 16 11/08/2018   CREATININE 1.11 11/08/2018   BCR 11 08/06/2018   GFRAA >60 11/08/2018   GFRNONAA >60 11/08/2018   Hepatic Function Lab Results  Component Value Date   AST 33 08/06/2018   ALT 40 08/06/2018  ALBUMIN 5.2 08/06/2018   UDS Summary  Date Value Ref Range Status  08/03/2018 FINAL  Final    Comment:     ==================================================================== TOXASSURE SELECT 13 (MW) ==================================================================== Test                             Result       Flag       Units Drug Present and Declared for Prescription Verification   Tramadol                       >2174        EXPECTED   ng/mg creat   O-Desmethyltramadol            >2174        EXPECTED   ng/mg creat   N-Desmethyltramadol            1101         EXPECTED   ng/mg creat    Source of tramadol is a prescription medication.    O-desmethyltramadol and N-desmethyltramadol are expected    metabolites of tramadol. Drug Present not Declared for Prescription Verification   Tapentadol                     191          UNEXPECTED ng/mg creat    Source of tapentadol is a scheduled prescription medication. ==================================================================== Test                      Result    Flag   Units      Ref Range   Creatinine              230              mg/dL      >=20 ==================================================================== Declared Medications:  The flagging and interpretation on this report are based on the  following declared medications.  Unexpected results may arise from  inaccuracies in the declared medications.  **Note: The testing scope of this panel includes these medications:  Tramadol (Ultram)  **Note: The testing scope of this panel does not include following  reported medications:  Amlodipine (Norvasc)  Aspirin (Aspirin 81)  Fexofenadine (Allegra)  Hydrocortisone (Anusol HC)  Metoprolol (Lopressor)  Multivitamin  Omega-3 Fatty Acids (Fish Oil)  Rosuvastatin (Crestor)  Ubiquinone (CoQ10)  Zolpidem (Ambien) ==================================================================== For clinical consultation, please call [phone]. ====================================================================    Note: Above Lab results  reviewed.  Recent imaging  CT Abdomen Pelvis W Contrast CLINICAL DATA:  58 year old male with a history of unexplained weight loss  EXAM: CT CHEST, ABDOMEN, AND PELVIS WITH CONTRAST  TECHNIQUE: Multidetector CT imaging of the chest, abdomen and pelvis was performed following the standard protocol during bolus administration of intravenous contrast.  CONTRAST:  169mL OMNIPAQUE IOHEXOL 300 MG/ML  SOLN  COMPARISON:  None.  FINDINGS: CT CHEST FINDINGS  Cardiovascular:  Heart:  Surgical changes of median sternotomy and CABG. Native coronary calcifications  Aorta:  Unremarkable course, caliber, contour of the thoracic aorta. No aneurysm or dissection flap. No periaortic fluid. Calcifications of the aortic arch. Branch vessels are patent. Descending thoracic aorta with mild atherosclerotic changes.  Pulmonary arteries:  Study is not tailored for the most sensitive evaluation of the pulmonary arteries, however, no central filling defects identified.  Mediastinum/Nodes: Small lymph nodes of the mediastinum. Calcified lymph nodes in the subcarinal  and left hilar nodal stations. Unremarkable appearance of the thoracic esophagus.  Unremarkable appearance of the thoracic inlet.  Lungs/Pleura: Central airways are clear. No pleural effusion. No confluent airspace disease.  No pneumothorax.  Musculoskeletal: No acute displaced fracture. Degenerative changes of the spine.  Review of the MIP images confirms the above findings.  CT ABDOMEN PELVIS FINDINGS  Hepatobiliary: Unremarkable appearance of liver. Unremarkable gallbladder  Pancreas: Unremarkable pancreas  Spleen: Punctate calcifications throughout the spleen, compatible with prior granulomatous disease.  Adrenals/Urinary Tract: Unremarkable appearance of the adrenal glands. No evidence of hydronephrosis of the right or left kidney. No nephrolithiasis. Unremarkable course of the bilateral ureters. Unremarkable  appearance of the urinary bladder.  Stomach/Bowel: Unremarkable stomach. Unremarkable small bowel. Appendectomy. Moderate stool burden. Colonic diverticular disease without evidence of acute inflammation.  Vascular/Lymphatic: Atherosclerotic changes of the abdominal aorta. Mesenteric arteries and bilateral renal arteries are patent. Bilateral iliac arteries and proximal femoral arteries patent.  Reproductive: Diameter of the prostate measures 4.4 cm  Other: None  Musculoskeletal: No acute displaced fracture.  Degenerative changes.  IMPRESSION: No acute CT finding.  No finding to account for weight loss.  Surgical changes of median sternotomy and CABG.  Aortic Atherosclerosis (ICD10-I70.0).  Diverticular disease without evidence of acute diverticulitis.  Electronically Signed   By: Corrie Mckusick D.O.   On: 05/06/2018 09:57 CT Chest W Contrast CLINICAL DATA:  58 year old male with a history of unexplained weight loss  EXAM: CT CHEST, ABDOMEN, AND PELVIS WITH CONTRAST  TECHNIQUE: Multidetector CT imaging of the chest, abdomen and pelvis was performed following the standard protocol during bolus administration of intravenous contrast.  CONTRAST:  179mL OMNIPAQUE IOHEXOL 300 MG/ML  SOLN  COMPARISON:  None.  FINDINGS: CT CHEST FINDINGS  Cardiovascular:  Heart:  Surgical changes of median sternotomy and CABG. Native coronary calcifications  Aorta:  Unremarkable course, caliber, contour of the thoracic aorta. No aneurysm or dissection flap. No periaortic fluid. Calcifications of the aortic arch. Branch vessels are patent. Descending thoracic aorta with mild atherosclerotic changes.  Pulmonary arteries:  Study is not tailored for the most sensitive evaluation of the pulmonary arteries, however, no central filling defects identified.  Mediastinum/Nodes: Small lymph nodes of the mediastinum. Calcified lymph nodes in the subcarinal and left hilar nodal  stations. Unremarkable appearance of the thoracic esophagus.  Unremarkable appearance of the thoracic inlet.  Lungs/Pleura: Central airways are clear. No pleural effusion. No confluent airspace disease.  No pneumothorax.  Musculoskeletal: No acute displaced fracture. Degenerative changes of the spine.  Review of the MIP images confirms the above findings.  CT ABDOMEN PELVIS FINDINGS  Hepatobiliary: Unremarkable appearance of liver. Unremarkable gallbladder  Pancreas: Unremarkable pancreas  Spleen: Punctate calcifications throughout the spleen, compatible with prior granulomatous disease.  Adrenals/Urinary Tract: Unremarkable appearance of the adrenal glands. No evidence of hydronephrosis of the right or left kidney. No nephrolithiasis. Unremarkable course of the bilateral ureters. Unremarkable appearance of the urinary bladder.  Stomach/Bowel: Unremarkable stomach. Unremarkable small bowel. Appendectomy. Moderate stool burden. Colonic diverticular disease without evidence of acute inflammation.  Vascular/Lymphatic: Atherosclerotic changes of the abdominal aorta. Mesenteric arteries and bilateral renal arteries are patent. Bilateral iliac arteries and proximal femoral arteries patent.  Reproductive: Diameter of the prostate measures 4.4 cm  Other: None  Musculoskeletal: No acute displaced fracture.  Degenerative changes.  IMPRESSION: No acute CT finding.  No finding to account for weight loss.  Surgical changes of median sternotomy and CABG.  Aortic Atherosclerosis (ICD10-I70.0).  Diverticular disease without evidence of acute  diverticulitis.  Electronically Signed   By: Corrie Mckusick D.O.   On: 05/06/2018 09:57  Assessment  The primary encounter diagnosis was Lumbar radiculopathy. Diagnoses of Lumbar facet arthropathy, Lumbar degenerative disc disease, Chronic midline low back pain without sciatica, Piriformis muscle pain, and Chronic pain syndrome were  also pertinent to this visit.  Plan of Care  I am having Thurmon K. Coddington "Lennette Bihari" start on Belbuca. I am also having him maintain his MULTIPLE VITAMINS PO, aspirin EC, metoprolol tartrate, zolpidem, rosuvastatin, Testosterone, and amLODipine.  Plan to discontinue Nucynta given lack of analgesic benefit and side effects of vivid dreams.  We will go back to [facility], previous dose of 600 mcg film twice daily.  Pharmacotherapy (Medications Ordered): Meds ordered this encounter  Medications  . Buprenorphine HCl (BELBUCA) 600 MCG FILM    Sig: Place 1 Film inside cheek 2 (two) times daily.    Dispense:  60 each    Refill:  2   Orders:  No orders of the defined types were placed in this encounter.  Follow-up plan:   Return in about 3 months (around 07/15/2019) for Medication Management.   Recent Visits Date Type Provider Dept  03/29/19 Office Visit Gillis Santa, MD [facility]  03/01/19 Office Visit Gillis Santa, MD [facility]  Showing recent visits within past 90 days and meeting all other requirements   Today's Visits Date Type Provider Dept  04/14/19 Office Visit Gillis Santa, MD [facility]  Showing today's visits and meeting all other requirements   Future Appointments No visits were found meeting these conditions.  Showing future appointments within next 90 days and meeting all other requirements   I discussed the assessment and treatment plan with the patient. The patient was provided an opportunity to ask questions and all were answered. The patient agreed with the plan and demonstrated an understanding of the instructions.  Patient advised to call back or seek an in-person evaluation if the symptoms or condition worsens.  Total duration of non-face-to-face encounter: 62minutes.  Note by: Gillis Santa, MD Date: 04/14/2019; Time: 9:59 AM  Note: This dictation was prepared with Dragon dictation. Any transcriptional errors that may result from  this process are unintentional.  Disclaimer:  * Given the special circumstances of the COVID-19 pandemic, the federal government has announced that the Office for Civil Rights (OCR) will exercise its enforcement discretion and will not impose penalties on physicians using telehealth in the event of noncompliance with regulatory requirements under the [place] (HIPAA) in connection with the good faith provision of telehealth during the BDZHG-99 national public health emergency. ([place])

## 2019-04-16 LAB — CBC WITH DIFFERENTIAL/PLATELET
Basophils Absolute: 0 10*3/uL (ref 0.0–0.2)
Basos: 0 %
EOS (ABSOLUTE): 0.2 10*3/uL (ref 0.0–0.4)
Eos: 2 %
Hematocrit: 41.8 % (ref 37.5–51.0)
Hemoglobin: 14.3 g/dL (ref 13.0–17.7)
Immature Grans (Abs): 0 10*3/uL (ref 0.0–0.1)
Immature Granulocytes: 0 %
Lymphocytes Absolute: 0.7 10*3/uL (ref 0.7–3.1)
Lymphs: 10 %
MCH: 31 pg (ref 26.6–33.0)
MCHC: 34.2 g/dL (ref 31.5–35.7)
MCV: 91 fL (ref 79–97)
Monocytes Absolute: 0.8 10*3/uL (ref 0.1–0.9)
Monocytes: 11 %
Neutrophils Absolute: 5.4 10*3/uL (ref 1.4–7.0)
Neutrophils: 77 %
Platelets: 159 10*3/uL (ref 150–450)
RBC: 4.61 x10E6/uL (ref 4.14–5.80)
RDW: 12.2 % (ref 11.6–15.4)
WBC: 7.1 10*3/uL (ref 3.4–10.8)

## 2019-04-16 LAB — COMPREHENSIVE METABOLIC PANEL
ALT: 22 IU/L (ref 0–44)
AST: 22 IU/L (ref 0–40)
Albumin/Globulin Ratio: 1.9 (ref 1.2–2.2)
Albumin: 4.4 g/dL (ref 3.8–4.9)
Alkaline Phosphatase: 72 IU/L (ref 39–117)
BUN/Creatinine Ratio: 13 (ref 9–20)
BUN: 13 mg/dL (ref 6–24)
Bilirubin Total: 0.3 mg/dL (ref 0.0–1.2)
CO2: 25 mmol/L (ref 20–29)
Calcium: 9.6 mg/dL (ref 8.7–10.2)
Chloride: 99 mmol/L (ref 96–106)
Creatinine, Ser: 0.98 mg/dL (ref 0.76–1.27)
GFR calc Af Amer: 98 mL/min/{1.73_m2} (ref >59)
GFR calc non Af Amer: 85 mL/min/{1.73_m2} (ref >59)
Globulin, Total: 2.3 g/dL (ref 1.5–4.5)
Glucose: 130 mg/dL — ABNORMAL HIGH (ref 65–99)
Potassium: 4.9 mmol/L (ref 3.5–5.2)
Sodium: 141 mmol/L (ref 134–144)
Total Protein: 6.7 g/dL (ref 6.0–8.5)

## 2019-04-16 LAB — LIPID PANEL
Chol/HDL Ratio: 5.7 ratio — ABNORMAL HIGH (ref 0.0–5.0)
Cholesterol, Total: 222 mg/dL — ABNORMAL HIGH (ref 100–199)
HDL: 39 mg/dL — ABNORMAL LOW (ref >39)
LDL Calculated: 143 mg/dL — ABNORMAL HIGH (ref 0–99)
Triglycerides: 201 mg/dL — ABNORMAL HIGH (ref 0–149)
VLDL Cholesterol Cal: 40 mg/dL (ref 5–40)

## 2019-04-16 LAB — PSA: Prostate Specific Ag, Serum: 0.4 ng/mL (ref 0.0–4.0)

## 2019-04-16 LAB — TESTOSTERONE,FREE AND TOTAL
Testosterone, Free: 9.8 pg/mL (ref 7.2–24.0)
Testosterone: 374 ng/dL (ref 264–916)

## 2019-04-16 LAB — PROLACTIN: Prolactin: 9.2 ng/mL (ref 4.0–15.2)

## 2019-04-16 LAB — TSH: TSH: 1.59 u[IU]/mL (ref 0.450–4.500)

## 2019-04-16 LAB — CORTISOL: Cortisol: 14.6 ug/dL

## 2019-04-26 ENCOUNTER — Telehealth: Payer: Self-pay

## 2019-04-26 NOTE — Telephone Encounter (Signed)
Encounter error

## 2019-04-26 NOTE — Telephone Encounter (Signed)
Patient advised, and appointment scheduled.

## 2019-04-26 NOTE — Telephone Encounter (Signed)
Patient notified of lab results. He states that he had grape juice with medications before labs were done. He states that he started back taking his Crestor 3 days ago.

## 2019-04-26 NOTE — Telephone Encounter (Signed)
Have pt RTC 2 months and we will recheck and will get lipids and A1C.

## 2019-04-26 NOTE — Telephone Encounter (Signed)
-----   Message from Jerrol Banana., MD sent at 04/25/2019  7:09 PM EDT ----- If labs fasting pt now with early DM. Has he been taking crestor? Lipids too high.,

## 2019-05-24 ENCOUNTER — Encounter: Payer: 59 | Admitting: Student in an Organized Health Care Education/Training Program

## 2019-05-25 ENCOUNTER — Other Ambulatory Visit: Payer: Self-pay | Admitting: Family Medicine

## 2019-06-13 ENCOUNTER — Other Ambulatory Visit: Payer: Self-pay | Admitting: Student in an Organized Health Care Education/Training Program

## 2019-06-22 ENCOUNTER — Ambulatory Visit (INDEPENDENT_AMBULATORY_CARE_PROVIDER_SITE_OTHER): Payer: 59 | Admitting: Family Medicine

## 2019-06-22 ENCOUNTER — Encounter: Payer: Self-pay | Admitting: Family Medicine

## 2019-06-22 ENCOUNTER — Other Ambulatory Visit: Payer: Self-pay

## 2019-06-22 VITALS — BP 118/72 | HR 62 | Temp 97.0°F | Resp 16 | Ht 71.0 in | Wt 169.0 lb

## 2019-06-22 DIAGNOSIS — M5416 Radiculopathy, lumbar region: Secondary | ICD-10-CM

## 2019-06-22 DIAGNOSIS — I2510 Atherosclerotic heart disease of native coronary artery without angina pectoris: Secondary | ICD-10-CM | POA: Diagnosis not present

## 2019-06-22 DIAGNOSIS — E785 Hyperlipidemia, unspecified: Secondary | ICD-10-CM | POA: Diagnosis not present

## 2019-06-22 DIAGNOSIS — R739 Hyperglycemia, unspecified: Secondary | ICD-10-CM

## 2019-06-22 DIAGNOSIS — Z23 Encounter for immunization: Secondary | ICD-10-CM

## 2019-06-22 NOTE — Progress Notes (Signed)
Patient: Shawn Crawford Male    DOB: [birth year]   58 y.o.   MRN: [id number] Visit Date: 06/22/2019  Today's Provider: [name], MD   Chief Complaint  Patient presents with  . Hyperlipidemia   Subjective:   HPI Patient comes in today for a recheck of labs. He was last seen in the office 2 months ago, and was advised to start back on Crestor. He is also due to have HbgA1c repeated.  BP Readings from Last 3 Encounters:  06/22/19 118/72  04/13/19 98/60  03/29/19 127/83   Wt Readings from Last 3 Encounters:  06/22/19 169 lb (76.7 kg)  04/13/19 167 lb 3.2 oz (75.8 kg)  03/29/19 162 lb (73.5 kg)    Allergies  Allergen Reactions  . Penicillins Itching and Other (See Comments)    Did it involve swelling of the face/tongue/throat, SOB, or low BP? No Did it involve sudden or severe rash/hives, skin peeling, or any reaction on the inside of your mouth or nose? No Did you need to seek medical attention at a hospital or doctor's office? Yes When did it last happen? Childhood allergy If all above answers are "NO", may proceed with cephalosporin use.      Current Outpatient Medications:  .  amLODipine (NORVASC) 5 MG tablet, TAKE 1 TABLET BY MOUTH  DAILY, Disp: 90 tablet, Rfl: 3 .  aspirin EC 81 MG tablet, Take 81 mg by mouth daily., Disp: , Rfl:  .  Buprenorphine HCl (BELBUCA) 600 MCG FILM, Place 1 Film inside cheek 2 (two) times daily., Disp: 60 each, Rfl: 2 .  metoprolol tartrate (LOPRESSOR) 25 MG tablet, Take 0.5 tablets (12.5 mg total) by mouth 2 (two) times daily. (Patient taking differently: Take 25 mg by mouth 2 (two) times daily. ), Disp: 60 tablet, Rfl: 1 .  MULTIPLE VITAMINS PO, Take 1 tablet by mouth daily. , Disp: , Rfl:  .  rosuvastatin (CRESTOR) 40 MG tablet, TAKE 1 TABLET BY MOUTH  DAILY, Disp: 90 tablet, Rfl: 3 .  Testosterone 1.62 % GEL, APPLY TWO PUMPS ONTO THE SKIN ONCE DAILY, Disp: 75 g, Rfl: 5 .  zolpidem (AMBIEN CR) 12.5 MG CR tablet, Take 1  tablet (12.5 mg total) by mouth at bedtime as needed., Disp: 90 tablet, Rfl: 1  Review of Systems  Constitutional: Negative for activity change and fatigue.  Respiratory: Negative.   Cardiovascular: Negative for chest pain, palpitations and leg swelling.  Gastrointestinal: Negative.   Endocrine: Negative.   Musculoskeletal: Positive for arthralgias and myalgias.  Allergic/Immunologic: Negative.   Neurological: Negative for dizziness, light-headedness and headaches.  Psychiatric/Behavioral: Negative for agitation, self-injury, sleep disturbance and suicidal ideas. The patient is not nervous/anxious.     Social History   Tobacco Use  . Smoking status: Never Smoker  . Smokeless tobacco: Never Used  Substance Use Topics  . Alcohol use: Yes    Alcohol/week: 0.0 standard drinks    Comment: glass of wine occasionally      Objective:   BP 118/72   Pulse 62   Temp (!) 97 F (36.1 C)   Resp 16   Ht 5\' 11"  (1.803 m)   Wt 169 lb (76.7 kg)   SpO2 98%   BMI 23.57 kg/m  Vitals:   06/22/19 0852  BP: 118/72  Pulse: 62  Resp: 16  Temp: (!) 97 F (36.1 C)  SpO2: 98%  Weight: 169 lb (76.7 kg)  Height: 5\' 11"  (1.803 m)  Body mass index is 23.57 kg/m.   Physical Exam Vitals signs reviewed.  Constitutional:      Appearance: Normal appearance.  HENT:     Head: Normocephalic and atraumatic.     Right Ear: External ear normal.     Left Ear: External ear normal.  Eyes:     General: No scleral icterus. Cardiovascular:     Rate and Rhythm: Normal rate and regular rhythm.     Heart sounds: Normal heart sounds.  Pulmonary:     Breath sounds: Normal breath sounds.  Abdominal:     Palpations: Abdomen is soft.  Skin:    General: Skin is warm and dry.  Neurological:     General: No focal deficit present.     Mental Status: He is alert and oriented to person, place, and time.  Psychiatric:        Mood and Affect: Mood normal.        Behavior: Behavior normal.        Thought  Content: Thought content normal.        Judgment: Judgment normal.      No results found for any visits on 06/22/19.     Assessment & Plan    1. Hyperlipidemia, unspecified hyperlipidemia type  - Lipid panel  2. Hyperglycemia  - Hemoglobin A1c  3. Need for influenza vaccination  - Flu Vaccine QUAD 6+ mos PF IM (Fluarix Quad PF)  4. Arteriosclerosis of coronary artery All risk factors treated.  5. Lumbar radiculopathy Per pain clinic.     Halsey Hammen Cranford Mon, MD  [facility]

## 2019-06-24 ENCOUNTER — Telehealth: Payer: Self-pay

## 2019-06-24 LAB — HEMOGLOBIN A1C
Est. average glucose Bld gHb Est-mCnc: 114 mg/dL
Hgb A1c MFr Bld: 5.6 % (ref 4.8–5.6)

## 2019-06-24 LAB — LIPID PANEL
Chol/HDL Ratio: 3.6 ratio (ref 0.0–5.0)
Cholesterol, Total: 140 mg/dL (ref 100–199)
HDL: 39 mg/dL — ABNORMAL LOW (ref >39)
LDL Chol Calc (NIH): 76 mg/dL (ref 0–99)
Triglycerides: 144 mg/dL (ref 0–149)
VLDL Cholesterol Cal: 25 mg/dL (ref 5–40)

## 2019-06-24 NOTE — Telephone Encounter (Signed)
Patient notified of lab results

## 2019-06-24 NOTE — Telephone Encounter (Signed)
-----   Message from Jerrol Banana., MD sent at 06/24/2019  1:29 PM EDT ----- Sugar well controlled.  Lipids okay.

## 2019-07-11 ENCOUNTER — Encounter: Payer: Self-pay | Admitting: Student in an Organized Health Care Education/Training Program

## 2019-07-11 ENCOUNTER — Telehealth: Payer: Self-pay | Admitting: *Deleted

## 2019-07-12 ENCOUNTER — Other Ambulatory Visit: Payer: Self-pay

## 2019-07-12 ENCOUNTER — Ambulatory Visit
Payer: 59 | Attending: Student in an Organized Health Care Education/Training Program | Admitting: Student in an Organized Health Care Education/Training Program

## 2019-07-12 ENCOUNTER — Encounter: Payer: Self-pay | Admitting: Student in an Organized Health Care Education/Training Program

## 2019-07-12 DIAGNOSIS — M545 Low back pain, unspecified: Secondary | ICD-10-CM

## 2019-07-12 DIAGNOSIS — M5416 Radiculopathy, lumbar region: Secondary | ICD-10-CM

## 2019-07-12 DIAGNOSIS — M47816 Spondylosis without myelopathy or radiculopathy, lumbar region: Secondary | ICD-10-CM

## 2019-07-12 DIAGNOSIS — M7918 Myalgia, other site: Secondary | ICD-10-CM

## 2019-07-12 DIAGNOSIS — M5136 Other intervertebral disc degeneration, lumbar region: Secondary | ICD-10-CM

## 2019-07-12 DIAGNOSIS — G894 Chronic pain syndrome: Secondary | ICD-10-CM

## 2019-07-12 DIAGNOSIS — G8929 Other chronic pain: Secondary | ICD-10-CM

## 2019-07-12 MED ORDER — TRAMADOL HCL 50 MG PO TABS: 50.0000 mg | ORAL_TABLET | Freq: Three times a day (TID) | ORAL | 0 refills | Status: DC | PRN

## 2019-07-12 MED ORDER — BELBUCA 450 MCG BU FILM: 1.0000 | ORAL_FILM | Freq: Two times a day (BID) | BUCCAL | 0 refills | Status: DC

## 2019-07-12 NOTE — Progress Notes (Signed)
Pain Management Virtual Encounter Note - Virtual Visit via Vandiver (real-time audio visits between healthcare provider and patient).   Patient's Phone No. & Preferred Pharmacy:  [phone] (home); [phone] (mobile); (Preferred) [phone] [email or web address]  [place], [place] [address] [address] Phone: [phone] Fax: [phone]  [place], [place] [facility] [address] [place] [address] [place] [address] Phone: [phone] Fax: [phone]    Pre-screening note:  Our staff contacted Mr. Mulroy and offered him an "in person", "face-to-face" appointment versus a telephone encounter. He indicated preferring the telephone encounter, at this time.   Reason for Virtual Visit: COVID-19*  Social distancing based on CDC and AMA recommendations.   I contacted Nira Conn Cibrian on 07/12/2019 via video conference.      I clearly identified myself as Gillis Santa, MD. I verified that I was speaking with the correct person using two identifiers (Name: Stuart Callais, and date of birth: [birth year]).  Advanced Informed Consent I sought verbal advanced consent from Gwendalyn Ege for virtual visit interactions. I informed Mr. Dronen of possible security and privacy concerns, risks, and limitations associated with providing "not-in-person" medical evaluation and management services. I also informed Mr. Mazor of the availability of "in-person" appointments. Finally, I informed him that there would be a charge for the virtual visit and that he could be  personally, fully or partially, financially responsible for it. Mr. Pennachio expressed understanding and agreed to proceed.   Historic Elements   Mr. Edna Spears is a 58 y.o. year old, male patient evaluated today after his last encounter by our practice on 07/11/2019. Mr. Hovsepian  has a past medical history of cardiac  catheterization, Hyperlipidemia, and Hypertension. He also  has a past surgical history that includes Coronary artery bypass graft; Hernia repair; Appendectomy; Cardiac catheterization; Colonoscopy with propofol (N/A, 06/24/2018); Esophagogastroduodenoscopy (egd) with propofol (N/A, 06/24/2018); and Exam under anesthesia with hemorrhoidectomy (N/A, 11/11/2018). Mr. Mikeal has a current medication list which includes the following prescription(s): amlodipine, aspirin ec, multiple vitamin, rosuvastatin, testosterone, zolpidem, belbuca, metoprolol tartrate, and tramadol. He  reports that he has never smoked. He has never used smokeless tobacco. He reports current alcohol use. He reports that he does not use drugs. Mr. Kromah is allergic to penicillins.   HPI  Today, he is being contacted for medication management.   Patient states that his back pain is better but that he is having increased soreness in his bilateral calves and bilateral feet.  No inciting or traumatic event.  No recent injuries.  Unlikely that this is related to increase in belbuca but we discussed decreasing dose back down to 450 mcg to see if his symptoms improved.  For breakthrough pain, we discussed adding tramadol 50 mg 3 times daily as needed.  Pharmacotherapy Assessment  Analgesic:  06/13/2019  3   04/14/2019  Belbuca 600 MCG Film  60.00  30 Bi Lat   [id number]   Thr (4878)   2  1.20 mg  Comm Ins   [place]    Monitoring: Pharmacotherapy: No side-effects or adverse reactions reported. [place] PMP: PDMP reviewed during this encounter.       Compliance: No problems identified. Effectiveness: Clinically acceptable. Plan: Refer to "POC".  UDS:  Summary  Date Value Ref Range Status  08/03/2018 FINAL  Final    Comment:    ==================================================================== TOXASSURE SELECT 13 (MW) ==================================================================== Test  Result       Flag        Units Drug Present and Declared for Prescription Verification   Tramadol                       >2174        EXPECTED   ng/mg creat   O-Desmethyltramadol            >2174        EXPECTED   ng/mg creat   N-Desmethyltramadol            1101         EXPECTED   ng/mg creat    Source of tramadol is a prescription medication.    O-desmethyltramadol and N-desmethyltramadol are expected    metabolites of tramadol. Drug Present not Declared for Prescription Verification   Tapentadol                     191          UNEXPECTED ng/mg creat    Source of tapentadol is a scheduled prescription medication. ==================================================================== Test                      Result    Flag   Units      Ref Range   Creatinine              230              mg/dL      >=20 ==================================================================== Declared Medications:  The flagging and interpretation on this report are based on the  following declared medications.  Unexpected results may arise from  inaccuracies in the declared medications.  **Note: The testing scope of this panel includes these medications:  Tramadol (Ultram)  **Note: The testing scope of this panel does not include following  reported medications:  Amlodipine (Norvasc)  Aspirin (Aspirin 81)  Fexofenadine (Allegra)  Hydrocortisone (Anusol HC)  Metoprolol (Lopressor)  Multivitamin  Omega-3 Fatty Acids (Fish Oil)  Rosuvastatin (Crestor)  Ubiquinone (CoQ10)  Zolpidem (Ambien) ==================================================================== For clinical consultation, please call [phone]. ====================================================================    Laboratory Chemistry Profile (12 mo)  Renal: 04/15/2019: BUN 13; BUN/Creatinine Ratio 13; Creatinine, Ser 0.98  Lab Results  Component Value Date   GFRAA 98 04/15/2019   GFRNONAA 85 04/15/2019   Hepatic: 04/15/2019: Albumin 4.4 Lab Results   Component Value Date   AST 22 04/15/2019   ALT 22 04/15/2019   Other: 08/06/2018: Sed Rate 2 Note: Above Lab results reviewed.   Assessment  The primary encounter diagnosis was Lumbar radiculopathy. Diagnoses of Lumbar facet arthropathy, Lumbar degenerative disc disease, Piriformis muscle pain, Chronic midline low back pain without sciatica, and Chronic pain syndrome were also pertinent to this visit.  Plan of Care  I have discontinued Gareld K. Virgil "Kevin"'s Belbuca. I am also having him start on Belbuca and traMADol. Additionally, I am having him maintain his MULTIPLE VITAMINS PO, aspirin EC, metoprolol tartrate, rosuvastatin, Testosterone, amLODipine, and zolpidem.   1.  Decrease belbuca back down to 450 mcg twice daily.  Added tramadol as below. 2.  Follow-up in 1 month.  Will need urine drug screen at that time for annual medication compliance and monitoring.  Pharmacotherapy (Medications Ordered): Meds ordered this encounter  Medications  . Buprenorphine HCl (BELBUCA) 450 MCG FILM    Sig: Place 1 Film inside cheek 2 (two) times daily.    Dispense:  60 each    Refill:  0  . traMADol (ULTRAM) 50 MG tablet    Sig: Take 1 tablet (50 mg total) by mouth every 8 (eight) hours as needed for severe pain. Month last 30 days.    Dispense:  90 tablet    Refill:  0    [place] STOP ACT - Not applicable. Fill one day early if pharmacy is closed on scheduled refill date. .   Follow-up plan:   Return in about 4 weeks (around 08/09/2019) for Medication Management, in person (need UDS).   Recent Visits Date Type Provider Dept  04/14/19 Office Visit Gillis Santa, MD [facility]  Showing recent visits within past 90 days and meeting all other requirements   Today's Visits Date Type Provider Dept  07/12/19 Office Visit Gillis Santa, MD [facility]  Showing today's visits and meeting all other requirements   Future Appointments No visits were found meeting these  conditions.  Showing future appointments within next 90 days and meeting all other requirements   I discussed the assessment and treatment plan with the patient. The patient was provided an opportunity to ask questions and all were answered. The patient agreed with the plan and demonstrated an understanding of the instructions.  Patient advised to call back or seek an in-person evaluation if the symptoms or condition worsens.  Total duration of non-face-to-face encounter: 31minutes.  Note by: Gillis Santa, MD Date: 07/12/2019; Time: 2:30 PM  Note: This dictation was prepared with Dragon dictation. Any transcriptional errors that may result from this process are unintentional.  Disclaimer:  * Given the special circumstances of the COVID-19 pandemic, the federal government has announced that the Office for Civil Rights (OCR) will exercise its enforcement discretion and will not impose penalties on physicians using telehealth in the event of noncompliance with regulatory requirements under the [place] (HIPAA) in connection with the good faith provision of telehealth during the XX123456 national public health emergency. ([place])

## 2019-07-14 ENCOUNTER — Encounter: Payer: 59 | Admitting: Student in an Organized Health Care Education/Training Program

## 2019-08-09 ENCOUNTER — Encounter: Payer: Self-pay | Admitting: Student in an Organized Health Care Education/Training Program

## 2019-08-09 ENCOUNTER — Ambulatory Visit
Payer: 59 | Attending: Student in an Organized Health Care Education/Training Program | Admitting: Student in an Organized Health Care Education/Training Program

## 2019-08-09 ENCOUNTER — Other Ambulatory Visit: Payer: Self-pay

## 2019-08-09 VITALS — BP 118/84 | HR 90 | Temp 97.7°F | Ht 71.0 in | Wt 163.0 lb

## 2019-08-09 DIAGNOSIS — M47816 Spondylosis without myelopathy or radiculopathy, lumbar region: Secondary | ICD-10-CM | POA: Diagnosis present

## 2019-08-09 DIAGNOSIS — M7918 Myalgia, other site: Secondary | ICD-10-CM | POA: Diagnosis present

## 2019-08-09 DIAGNOSIS — M5416 Radiculopathy, lumbar region: Secondary | ICD-10-CM | POA: Diagnosis present

## 2019-08-09 DIAGNOSIS — G894 Chronic pain syndrome: Secondary | ICD-10-CM | POA: Insufficient documentation

## 2019-08-09 DIAGNOSIS — M5136 Other intervertebral disc degeneration, lumbar region: Secondary | ICD-10-CM | POA: Diagnosis present

## 2019-08-09 MED ORDER — TRAMADOL HCL 50 MG PO TABS: 50.0000 mg | ORAL_TABLET | Freq: Three times a day (TID) | ORAL | 3 refills | Status: DC | PRN

## 2019-08-09 MED ORDER — BELBUCA 450 MCG BU FILM: 1.0000 | ORAL_FILM | Freq: Two times a day (BID) | BUCCAL | 3 refills | Status: DC

## 2019-08-09 NOTE — Progress Notes (Signed)
Safety precautions to be maintained throughout the outpatient stay will include: orient to surroundings, keep bed in low position, maintain call bell within reach at all times, provide assistance with transfer out of bed and ambulation.  

## 2019-08-09 NOTE — Progress Notes (Signed)
Patient's Name: Shawn Crawford  MRN: [id number]  Referring Provider: Jerrol Banana.,*  DOB: Dec 31, 1960  PCP: Jerrol Banana., MD  DOS: 08/09/2019  Note by: Gillis Santa, MD  Service setting: Ambulatory outpatient  Attending: Gillis Santa, MD  Location: [facility] (AMB) Pain Management Facility  Specialty: Interventional Pain Management  Patient type: Established   Primary Reason(s) for Visit: Encounter for prescription drug management. (Level of risk: moderate)  CC: Back Pain  HPI  Shawn Crawford is a 58 y.o. year old, male patient, who comes today for a medication management evaluation. He has Abnormal blood sugar; Arteriosclerosis of coronary artery; ED (erectile dysfunction) of organic origin; Acid reflux; HLD (hyperlipidemia); BP (high blood pressure); Lumbar radiculopathy; Avitaminosis D; Hyperglycemia; Benign essential HTN; Chronic pain syndrome; Chronic midline low back pain without sciatica; Lumbar facet arthropathy; Lumbar degenerative disc disease; Piriformis muscle pain; Special screening for malignant neoplasms, colon; Benign neoplasm of descending colon; Diverticulosis of large intestine without diverticulitis; Anal fissure s/p partial internal anal sphincterotomy 11/11/2018; Melanosis of colon; and Internal hemorrhoids on their problem list. His primarily concern today is the Back Pain  Pain Assessment: Location: Lower Back Radiating: pain radiaties down buttock to legs Onset: More than a month ago Duration: Chronic pain Quality: Aching, Sore Severity: 2 /10 (subjective, self-reported pain score)  Note: Reported level is compatible with observation.                         When using our objective Pain Scale, levels between 6 and 10/10 are said to belong in an emergency room, as it progressively worsens from a 6/10, described as severely limiting, requiring emergency care not usually available at an outpatient pain management facility. At a 6/10 level, communication becomes  difficult and requires great effort. Assistance to reach the emergency department may be required. Facial flushing and profuse sweating along with potentially dangerous increases in heart rate and blood pressure will be evident. Effect on ADL: limits my daily actvities Timing: Intermittent Modifying factors: meds, walking and laying on my side BP: 118/84  HR: 90  [name] was last scheduled for an appointment on 07/12/2019 for medication management. During today's appointment we reviewed Shawn Crawford chronic pain status, as well as his outpatient medication regimen.  No change in medical history since last visit.  Patient's pain is at baseline.  Patient continues multimodal pain regimen as prescribed.  States that it provides pain relief and improvement in functional status.  The patient  reports no history of drug use. His body mass index is 22.73 kg/m.  Further details on both, my assessment(s), as well as the proposed treatment plan, please see below.  Controlled Substance Pharmacotherapy Assessment REMS (Risk Evaluation and Mitigation Strategy)  Analgesic: 07/12/2019  3   07/12/2019  Bel[place]uca 450 MCG Film  60.00  30 Bi Lat   [id number]   Thr (4878)   0  0.90 mg  Comm Ins   [place]  07/12/2019  3   07/12/2019  Tramadol Hcl 50 MG Tablet  90.00  30 Bi Lat   [id number]   Thr (4878)   0  15.00 MME  Comm Ins        Chauncey Fischer, RN  08/09/2019  8:17 AM  Sign when Signing Visit Nursing Pain Medication Assessment:  Safety precautions to be maintained throughout the outpatient stay will include: orient to surroundings, keep bed in low position, maintain call bell within reach at all times,  provide assistance with transfer out of bed and ambulation.  Medication Inspection Compliance: Pill count conducted under aseptic conditions, in front of the patient. Neither the pills nor the bottle was removed from the patient's sight at any time. Once count was completed pills were immediately returned to the  patient in their original bottle.  Medication #1: buprenorphine buccal  Pill/Patch Count: 11 of 11 patches remain Pill/Patch Appearance: Markings consistent with prescribed medication Bottle Appearance: Standard pharmacy container. Clearly labeled. Filled Date: 10 / 13/ 2020 Last Medication intake:  Today  Medication #2: Tramadol (Ultram) Pill/Patch Count: 29 of 90 pills remain Pill/Patch Appearance: Markings consistent with prescribed medication Bottle Appearance: Standard pharmacy container. Clearly labeled. Filled Date: 46 / 13 / 2020 Last Medication intake:  Today  Chauncey Fischer, RN  08/09/2019  8:12 AM  Sign when Signing Visit Safety precautions to be maintained throughout the outpatient stay will include: orient to surroundings, keep bed in low position, maintain call bell within reach at all times, provide assistance with transfer out of bed and ambulation.    Pharmacokinetics: Liberation and absorption (onset of action): WNL Distribution (time to peak effect): WNL Metabolism and excretion (duration of action): WNL         Pharmacodynamics: Desired effects: Analgesia: Shawn Crawford reports >50% benefit. Functional ability: Patient reports that medication allows him to accomplish basic ADLs Clinically meaningful improvement in function (CMIF): Sustained CMIF goals met Perceived effectiveness: Described as relatively effective, allowing for increase in activities of daily living (ADL) Undesirable effects: Side-effects or Adverse reactions: None reported Monitoring: [place] PMP: PDMP not reviewed this encounter. Online review of the past 10-monthperiod conducted. Compliant with practice rules and regulations Last UDS on record: Summary  Date Value Ref Range Status  08/03/2018 FINAL  Final    Comment:    ==================================================================== TOXASSURE SELECT 13 (MW) ==================================================================== Test                              Result       Flag       Units Drug Present and Declared for Prescription Verification   Tramadol                       >2174        EXPECTED   ng/mg creat   O-Desmethyltramadol            >2174        EXPECTED   ng/mg creat   N-Desmethyltramadol            1101         EXPECTED   ng/mg creat    Source of tramadol is a prescription medication.    O-desmethyltramadol and N-desmethyltramadol are expected    metabolites of tramadol. Drug Present not Declared for Prescription Verification   Tapentadol                     191          UNEXPECTED ng/mg creat    Source of tapentadol is a scheduled prescription medication. ==================================================================== Test                      Result    Flag   Units      Ref Range   Creatinine              230  mg/dL      >=20 ==================================================================== Declared Medications:  The flagging and interpretation on this report are based on the  following declared medications.  Unexpected results may arise from  inaccuracies in the declared medications.  **Note: The testing scope of this panel includes these medications:  Tramadol (Ultram)  **Note: The testing scope of this panel does not include following  reported medications:  Amlodipine (Norvasc)  Aspirin (Aspirin 81)  Fexofenadine (Allegra)  Hydrocortisone (Anusol HC)  Metoprolol (Lopressor)  Multivitamin  Omega-3 Fatty Acids (Fish Oil)  Rosuvastatin (Crestor)  Ubiquinone (CoQ10)  Zolpidem (Ambien) ==================================================================== For clinical consultation, please call [phone]. ====================================================================    UDS interpretation: Compliant          Medication Assessment Form: Reviewed. Patient indicates being compliant with therapy Treatment compliance: Compliant Risk Assessment Profile: Aberrant behavior: See  initial evaluations. None observed or detected today Comorbid factors increasing risk of overdose: See initial evaluation. No additional risks detected today Opioid risk tool (ORT):  Opioid Risk  08/03/2018  Alcohol 0  Illegal Drugs 0  Rx Drugs 0  Alcohol 0  Illegal Drugs 0  Rx Drugs 0  Age between 16-45 years  0  History of Preadolescent Sexual Abuse 0  Psychological Disease 0  Depression 0  Opioid Risk Tool Scoring 0  Opioid Risk Interpretation Low Risk    ORT Scoring interpretation table:  Score <3 = Low Risk for SUD  Score between 4-7 = Moderate Risk for SUD  Score >8 = High Risk for Opioid Abuse   Risk of substance use disorder (SUD): Low  Risk Mitigation Strategies:  Patient Counseling: Covered Patient-Prescriber Agreement (PPA): Present and active  Notification to other healthcare providers: Done  Pharmacologic Plan: No change in therapy, at this time.             Laboratory Chemistry Profile   Screening No results found for: SARSCOV2NAA, COVIDSOURCE, STAPHAUREUS, MRSAPCR, HCVAB, HIV, PREGTESTUR  Inflammation (CRP: Acute Phase) (ESR: Chronic Phase) Lab Results  Component Value Date   ESRSEDRATE 2 08/06/2018                         Rheumatology Lab Results  Component Value Date   ANA Negative 08/06/2018   LABURIC 7.5 07/08/2017   HLAB27 Negative 05/12/2017                        Renal Lab Results  Component Value Date   BUN 13 04/15/2019   CREATININE 0.98 04/15/2019   BCR 13 04/15/2019   GFRAA 98 04/15/2019   GFRNONAA 85 04/15/2019                             Hepatic Lab Results  Component Value Date   AST 22 04/15/2019   ALT 22 04/15/2019   ALBUMIN 4.4 04/15/2019   ALKPHOS 72 04/15/2019                        Electrolytes Lab Results  Component Value Date   NA 141 04/15/2019   K 4.9 04/15/2019   CL 99 04/15/2019   CALCIUM 9.6 04/15/2019                        Neuropathy Lab Results  Component Value Date   HGBA1C 5.6 06/23/2019  CNS No results found for: COLORCSF, APPEARCSF, RBCCOUNTCSF, WBCCSF, POLYSCSF, LYMPHSCSF, EOSCSF, PROTEINCSF, GLUCCSF, JCVIRUS, CSFOLI, IGGCSF, LABACHR, ACETBL                      Bone Lab Results  Component Value Date   TESTOFREE 9.8 04/15/2019   TESTOSTERONE 374 04/15/2019                         Coagulation Lab Results  Component Value Date   PLT 159 04/15/2019                        Cardiovascular Lab Results  Component Value Date   CKTOTAL 75 08/06/2018   HGB 14.3 04/15/2019   HCT 41.8 04/15/2019                         ID No results found for: LYMEIGGIGMAB, HIV, [place], STAPHAUREUS, MRSAPCR, HCVAB, PREGTESTUR, MICROTEXT  Cancer No results found for: CEA, CA125, LABCA2                      Endocrine Lab Results  Component Value Date   TSH 1.590 04/15/2019   TESTOFREE 9.8 04/15/2019   TESTOSTERONE 374 04/15/2019                        Note: Lab results reviewed.  Recent Diagnostic Imaging Results  CT Abdomen Pelvis W Contrast CLINICAL DATA:  58 year old male with a history of unexplained weight loss  EXAM: CT CHEST, ABDOMEN, AND PELVIS WITH CONTRAST  TECHNIQUE: Multidetector CT imaging of the chest, abdomen and pelvis was performed following the standard protocol during bolus administration of intravenous contrast.  CONTRAST:  163m OMNIPAQUE IOHEXOL 300 MG/ML  SOLN  COMPARISON:  None.  FINDINGS: CT CHEST FINDINGS  Cardiovascular:  Heart:  Surgical changes of median sternotomy and CABG. Native coronary calcifications  Aorta:  Unremarkable course, caliber, contour of the thoracic aorta. No aneurysm or dissection flap. No periaortic fluid. Calcifications of the aortic arch. Branch vessels are patent. Descending thoracic aorta with mild atherosclerotic changes.  Pulmonary arteries:  Study is not tailored for the most sensitive evaluation of the pulmonary arteries, however, no central filling defects  identified.  Mediastinum/Nodes: Small lymph nodes of the mediastinum. Calcified lymph nodes in the subcarinal and left hilar nodal stations. Unremarkable appearance of the thoracic esophagus.  Unremarkable appearance of the thoracic inlet.  Lungs/Pleura: Central airways are clear. No pleural effusion. No confluent airspace disease.  No pneumothorax.  Musculoskeletal: No acute displaced fracture. Degenerative changes of the spine.  Review of the MIP images confirms the above findings.  CT ABDOMEN PELVIS FINDINGS  Hepatobiliary: Unremarkable appearance of liver. Unremarkable gallbladder  Pancreas: Unremarkable pancreas  Spleen: Punctate calcifications throughout the spleen, compatible with prior granulomatous disease.  Adrenals/Urinary Tract: Unremarkable appearance of the adrenal glands. No evidence of hydronephrosis of the right or left kidney. No nephrolithiasis. Unremarkable course of the bilateral ureters. Unremarkable appearance of the urinary bladder.  Stomach/Bowel: Unremarkable stomach. Unremarkable small bowel. Appendectomy. Moderate stool burden. Colonic diverticular disease without evidence of acute inflammation.  Vascular/Lymphatic: Atherosclerotic changes of the abdominal aorta. Mesenteric arteries and bilateral renal arteries are patent. Bilateral iliac arteries and proximal femoral arteries patent.  Reproductive: Diameter of the prostate measures 4.4 cm  Other: None  Musculoskeletal: No acute displaced fracture.  Degenerative changes.  IMPRESSION: No  acute CT finding.  No finding to account for weight loss.  Surgical changes of median sternotomy and CABG.  Aortic Atherosclerosis (ICD10-I70.0).  Diverticular disease without evidence of acute diverticulitis.  Electronically Signed   By: Corrie Mckusick D.O.   On: 05/06/2018 09:57 CT Chest W Contrast CLINICAL DATA:  58 year old male with a history of unexplained weight loss  EXAM: CT CHEST,  ABDOMEN, AND PELVIS WITH CONTRAST  TECHNIQUE: Multidetector CT imaging of the chest, abdomen and pelvis was performed following the standard protocol during bolus administration of intravenous contrast.  CONTRAST:  152m OMNIPAQUE IOHEXOL 300 MG/ML  SOLN  COMPARISON:  None.  FINDINGS: CT CHEST FINDINGS  Cardiovascular:  Heart:  Surgical changes of median sternotomy and CABG. Native coronary calcifications  Aorta:  Unremarkable course, caliber, contour of the thoracic aorta. No aneurysm or dissection flap. No periaortic fluid. Calcifications of the aortic arch. Branch vessels are patent. Descending thoracic aorta with mild atherosclerotic changes.  Pulmonary arteries:  Study is not tailored for the most sensitive evaluation of the pulmonary arteries, however, no central filling defects identified.  Mediastinum/Nodes: Small lymph nodes of the mediastinum. Calcified lymph nodes in the subcarinal and left hilar nodal stations. Unremarkable appearance of the thoracic esophagus.  Unremarkable appearance of the thoracic inlet.  Lungs/Pleura: Central airways are clear. No pleural effusion. No confluent airspace disease.  No pneumothorax.  Musculoskeletal: No acute displaced fracture. Degenerative changes of the spine.  Review of the MIP images confirms the above findings.  CT ABDOMEN PELVIS FINDINGS  Hepatobiliary: Unremarkable appearance of liver. Unremarkable gallbladder  Pancreas: Unremarkable pancreas  Spleen: Punctate calcifications throughout the spleen, compatible with prior granulomatous disease.  Adrenals/Urinary Tract: Unremarkable appearance of the adrenal glands. No evidence of hydronephrosis of the right or left kidney. No nephrolithiasis. Unremarkable course of the bilateral ureters. Unremarkable appearance of the urinary bladder.  Stomach/Bowel: Unremarkable stomach. Unremarkable small bowel. Appendectomy. Moderate stool burden. Colonic  diverticular disease without evidence of acute inflammation.  Vascular/Lymphatic: Atherosclerotic changes of the abdominal aorta. Mesenteric arteries and bilateral renal arteries are patent. Bilateral iliac arteries and proximal femoral arteries patent.  Reproductive: Diameter of the prostate measures 4.4 cm  Other: None  Musculoskeletal: No acute displaced fracture.  Degenerative changes.  IMPRESSION: No acute CT finding.  No finding to account for weight loss.  Surgical changes of median sternotomy and CABG.  Aortic Atherosclerosis (ICD10-I70.0).  Diverticular disease without evidence of acute diverticulitis.  Electronically Signed   By: JCorrie MckusickD.O.   On: 05/06/2018 09:57  Complexity Note: Imaging results reviewed. Results shared with Mr. DCleary using Layman's terms.                               Meds   Current Outpatient Medications:  .  amLODipine (NORVASC) 5 MG tablet, TAKE 1 TABLET BY MOUTH  DAILY, Disp: 90 tablet, Rfl: 3 .  aspirin EC 81 MG tablet, Take 81 mg by mouth daily., Disp: , Rfl:  .  Buprenorphine HCl (BELBUCA) 450 MCG FILM, Place 1 Film inside cheek 2 (two) times daily., Disp: 60 each, Rfl: 3 .  MULTIPLE VITAMINS PO, Take 1 tablet by mouth daily. , Disp: , Rfl:  .  rosuvastatin (CRESTOR) 40 MG tablet, TAKE 1 TABLET BY MOUTH  DAILY, Disp: 90 tablet, Rfl: 3 .  Testosterone 1.62 % GEL, APPLY TWO PUMPS ONTO THE SKIN ONCE DAILY, Disp: 75 g, Rfl: 5 .  traMADol (ULTRAM) 50  MG tablet, Take 1 tablet (50 mg total) by mouth every 8 (eight) hours as needed for severe pain. Month last 30 days., Disp: 90 tablet, Rfl: 3 .  zolpidem (AMBIEN CR) 12.5 MG CR tablet, Take 1 tablet (12.5 mg total) by mouth at bedtime as needed., Disp: 90 tablet, Rfl: 1 .  metoprolol tartrate (LOPRESSOR) 25 MG tablet, Take 0.5 tablets (12.5 mg total) by mouth 2 (two) times daily. (Patient not taking: Reported on 08/09/2019), Disp: 60 tablet, Rfl: 1  ROS  Constitutional: Denies any fever  or chills Gastrointestinal: No reported hemesis, hematochezia, vomiting, or acute GI distress Musculoskeletal: Denies any acute onset joint swelling, redness, loss of ROM, or weakness Neurological: No reported episodes of acute onset apraxia, aphasia, dysarthria, agnosia, amnesia, paralysis, loss of coordination, or loss of consciousness  Allergies  Mr. Favor is allergic to penicillins.  [place]  Drug: Mr. Largo  reports no history of drug use. Alcohol:  reports current alcohol use. Tobacco:  reports that he has never smoked. He has never used smokeless tobacco. Medical:  has a past medical history of cardiac catheterization, Hyperlipidemia, and Hypertension. Surgical: Mr. Hogen  has a past surgical history that includes Coronary artery bypass graft; Hernia repair; Appendectomy; Cardiac catheterization; Colonoscopy with propofol (N/A, 06/24/2018); Esophagogastroduodenoscopy (egd) with propofol (N/A, 06/24/2018); and Exam under anesthesia with hemorrhoidectomy (N/A, 11/11/2018). Family: family history includes Colon cancer in his sister; Diabetes in his father; Hyperlipidemia in his mother; Hypertension in his father; Thyroid cancer in his sister.  Constitutional Exam  General appearance: Well nourished, well developed, and well hydrated. In no apparent acute distress Vitals:   08/09/19 0817  BP: 118/84  Pulse: 90  Temp: 97.7 F (36.5 C)  SpO2: 99%  Weight: 163 lb (73.9 kg)  Height: '5\' 11"'$  (1.803 m)   BMI Assessment: Estimated body mass index is 22.73 kg/m as calculated from the following:   Height as of this encounter: '5\' 11"'$  (1.803 m).   Weight as of this encounter: 163 lb (73.9 kg).  BMI interpretation table: BMI level Category Range association with higher incidence of chronic pain  <18 kg/m2 Underweight   18.5-24.9 kg/m2 Ideal body weight   25-29.9 kg/m2 Overweight Increased incidence by 20%  30-34.9 kg/m2 Obese (Class I) Increased incidence by 68%  35-39.9 kg/m2 Severe obesity  (Class II) Increased incidence by 136%  >40 kg/m2 Extreme obesity (Class III) Increased incidence by 254%   Patient's current BMI Ideal Body weight  Body mass index is 22.73 kg/m. Ideal body weight: 75.3 kg (166 lb 0.1 oz)   BMI Readings from Last 4 Encounters:  08/09/19 22.73 kg/m  06/22/19 23.57 kg/m  04/13/19 23.32 kg/m  03/29/19 22.59 kg/m   Wt Readings from Last 4 Encounters:  08/09/19 163 lb (73.9 kg)  06/22/19 169 lb (76.7 kg)  04/13/19 167 lb 3.2 oz (75.8 kg)  03/29/19 162 lb (73.5 kg)  Psych/Mental status: Alert, oriented x 3 (person, place, & time)       Eyes: PERLA Respiratory: No evidence of acute respiratory distress  Cervical Spine Area Exam  Skin & Axial Inspection: No masses, redness, edema, swelling, or associated skin lesions Alignment: Symmetrical Functional ROM: Unrestricted ROM      Stability: No instability detected Muscle Tone/Strength: Functionally intact. No obvious neuro-muscular anomalies detected. Sensory (Neurological): Unimpaired Palpation: No palpable anomalies              Upper Extremity (UE) Exam    Side: Right upper extremity  Side: Left upper  extremity  Skin & Extremity Inspection: Skin color, temperature, and hair growth are WNL. No peripheral edema or cyanosis. No masses, redness, swelling, asymmetry, or associated skin lesions. No contractures.  Skin & Extremity Inspection: Skin color, temperature, and hair growth are WNL. No peripheral edema or cyanosis. No masses, redness, swelling, asymmetry, or associated skin lesions. No contractures.  Functional ROM: Unrestricted ROM          Functional ROM: Unrestricted ROM          Muscle Tone/Strength: Functionally intact. No obvious neuro-muscular anomalies detected.  Muscle Tone/Strength: Functionally intact. No obvious neuro-muscular anomalies detected.  Sensory (Neurological): Unimpaired          Sensory (Neurological): Unimpaired          Palpation: No palpable anomalies               Palpation: No palpable anomalies              Provocative Test(s):  Phalen's test: deferred Tinel's test: deferred Apley's scratch test (touch opposite shoulder):  Action 1 (Across chest): deferred Action 2 (Overhead): deferred Action 3 (LB reach): deferred   Provocative Test(s):  Phalen's test: deferred Tinel's test: deferred Apley's scratch test (touch opposite shoulder):  Action 1 (Across chest): deferred Action 2 (Overhead): deferred Action 3 (LB reach): deferred    Thoracic Spine Area Exam  Skin & Axial Inspection: No masses, redness, or swelling Alignment: Symmetrical Functional ROM: Unrestricted ROM Stability: No instability detected Muscle Tone/Strength: Functionally intact. No obvious neuro-muscular anomalies detected. Sensory (Neurological): Unimpaired Muscle strength & Tone: No palpable anomalies  Lumbar Spine Area Exam  Skin & Axial Inspection: No masses, redness, or swelling Alignment: Symmetrical Functional ROM: Unrestricted ROM       Stability: No instability detected Muscle Tone/Strength: Functionally intact. No obvious neuro-muscular anomalies detected. Sensory (Neurological): Unimpaired Palpation: No palpable anomalies       Provocative Tests: Hyperextension/rotation test: deferred today       Lumbar quadrant test (Kemp's test): deferred today       Lateral bending test: deferred today       Patrick's Maneuver: (+) for bilateral S-I arthralgia             FABER* test: deferred today                   S-I anterior distraction/compression test: deferred today         S-I lateral compression test: deferred today         S-I Thigh-thrust test: deferred today         S-I Gaenslen's test: deferred today         *(Flexion, ABduction and External Rotation)  Gait & Posture Assessment  Ambulation: Unassisted Gait: Relatively normal for age and body habitus Posture: WNL   Lower Extremity Exam    Side: Right lower extremity  Side: Left lower extremity   Stability: No instability observed          Stability: No instability observed          Skin & Extremity Inspection: Skin color, temperature, and hair growth are WNL. No peripheral edema or cyanosis. No masses, redness, swelling, asymmetry, or associated skin lesions. No contractures.  Skin & Extremity Inspection: Skin color, temperature, and hair growth are WNL. No peripheral edema or cyanosis. No masses, redness, swelling, asymmetry, or associated skin lesions. No contractures.  Functional ROM: Unrestricted ROM  Functional ROM: Unrestricted ROM                  Muscle Tone/Strength: Functionally intact. No obvious neuro-muscular anomalies detected.  Muscle Tone/Strength: Functionally intact. No obvious neuro-muscular anomalies detected.  Sensory (Neurological): Unimpaired        Sensory (Neurological): Unimpaired        DTR: Patellar: deferred today Achilles: deferred today Plantar: deferred today  DTR: Patellar: deferred today Achilles: deferred today Plantar: deferred today  Palpation: No palpable anomalies  Palpation: No palpable anomalies   Assessment   Status Diagnosis  Controlled Controlled Controlled 1. Chronic pain syndrome   2. Lumbar radiculopathy   3. Lumbar facet arthropathy   4. Piriformis muscle pain   5. Lumbar degenerative disc disease      Plan of Care  Pharmacotherapy (Medications Ordered): Meds ordered this encounter  Medications  . traMADol (ULTRAM) 50 MG tablet    Sig: Take 1 tablet (50 mg total) by mouth every 8 (eight) hours as needed for severe pain. Month last 30 days.    Dispense:  90 tablet    Refill:  3    [place] STOP ACT - Not applicable. Fill one day early if pharmacy is closed on scheduled refill date. .  . Buprenorphine HCl (BELBUCA) 450 MCG FILM    Sig: Place 1 Film inside cheek 2 (two) times daily.    Dispense:  60 each    Refill:  3    Orders:  Orders Placed This Encounter  Procedures  . UDS (Compliance-13) ([facility])  ([facility]) (Established Pt.)    Volume: 30 ml(s). Minimum 3 ml of urine is needed. Document temperature of fresh sample. Indications: Long term (current) use of opiate analgesic (Z79.891)    Lab Orders     UDS (Compliance-13) ([facility]) ([facility]) (Established Pt.)  Planned follow-up:   Return in about 4 months (around 12/07/2019) for Medication Management.     Recent Visits Date Type Provider Dept  07/12/19 Office Visit Gillis Santa, MD [facility]  Showing recent visits within past 90 days and meeting all other requirements   Today's Visits Date Type Provider Dept  08/09/19 Office Visit Gillis Santa, MD [facility]  Showing today's visits and meeting all other requirements   Future Appointments No visits were found meeting these conditions.  Showing future appointments within next 90 days and meeting all other requirements   Primary Care Physician: Jerrol Banana., MD Location: [facility] Note by: Gillis Santa, MD Date: 08/09/2019; Time: 8:43 AM  Note: This dictation was prepared with Dragon dictation. Any transcriptional errors that may result from this process are unintentional.

## 2019-08-09 NOTE — Progress Notes (Signed)
Nursing Pain Medication Assessment:  Safety precautions to be maintained throughout the outpatient stay will include: orient to surroundings, keep bed in low position, maintain call bell within reach at all times, provide assistance with transfer out of bed and ambulation.  Medication Inspection Compliance: Pill count conducted under aseptic conditions, in front of the patient. Neither the pills nor the bottle was removed from the patient's sight at any time. Once count was completed pills were immediately returned to the patient in their original bottle.  Medication #1: buprenorphine buccal  Pill/Patch Count: 11 of 11 patches remain Pill/Patch Appearance: Markings consistent with prescribed medication Bottle Appearance: Standard pharmacy container. Clearly labeled. Filled Date: 10 / 13/ 2020 Last Medication intake:  Today  Medication #2: Tramadol (Ultram) Pill/Patch Count: 29 of 90 pills remain Pill/Patch Appearance: Markings consistent with prescribed medication Bottle Appearance: Standard pharmacy container. Clearly labeled. Filled Date: 45 / 13 / 2020 Last Medication intake:  Today

## 2019-08-12 LAB — TOXASSURE SELECT 13 (MW), URINE

## 2019-08-15 ENCOUNTER — Telehealth: Payer: Self-pay | Admitting: Student in an Organized Health Care Education/Training Program

## 2019-08-15 NOTE — Telephone Encounter (Signed)
Patient lvmail stating his [place] needs PA with [facility]. Please let patient know when he can pick up

## 2019-08-15 NOTE — Telephone Encounter (Signed)
PA resent 08-15-2019

## 2019-09-13 ENCOUNTER — Other Ambulatory Visit: Payer: Self-pay | Admitting: Family Medicine

## 2019-09-13 DIAGNOSIS — R7989 Other specified abnormal findings of blood chemistry: Secondary | ICD-10-CM

## 2019-09-13 NOTE — Telephone Encounter (Signed)
Requested medication (s) are due for refill today: yes  Requested medication (s) are on the active medication list: yes  Last refill:  01/28/2019  Future visit scheduled: yes  Notes to clinic:  Medication not assigned to a protocol   Requested Prescriptions  Pending Prescriptions Disp Refills   Testosterone 1.62 % GEL [Pharmacy Med Name: TESTOSTERONE 1.62% GEL GM]      Sig: APPLY TWO PUMPS ONTO THE SKIN ONCE DAILY      Off-Protocol Failed - 09/13/2019  9:03 AM      Failed - Medication not assigned to a protocol, review manually.      Passed - Valid encounter within last 12 months    Recent Outpatient Visits           2 months ago Hyperlipidemia, unspecified hyperlipidemia type   [facility] Jerrol Banana., MD   5 months ago Annual physical exam   [facility] Jerrol Banana., MD   9 months ago Anal fissure s/p partial internal anal sphincterotomy 11/11/2018   [facility] Jerrol Banana., MD   1 year ago [place] Jerrol Banana., MD   1 year ago Weight loss   [facility] Jerrol Banana., MD       Future Appointments             In 4 weeks Jerrol Banana., MD [facility] [place]   In 1 month Jerrol Banana., MD [facility]

## 2019-09-14 ENCOUNTER — Ambulatory Visit: Payer: 59 | Admitting: Family Medicine

## 2019-10-10 ENCOUNTER — Other Ambulatory Visit: Payer: Self-pay | Admitting: Family Medicine

## 2019-10-10 DIAGNOSIS — E785 Hyperlipidemia, unspecified: Secondary | ICD-10-CM

## 2019-10-12 ENCOUNTER — Ambulatory Visit: Payer: 59 | Admitting: Family Medicine

## 2019-10-17 ENCOUNTER — Ambulatory Visit: Payer: Self-pay | Admitting: Family Medicine

## 2019-11-08 ENCOUNTER — Other Ambulatory Visit: Payer: Self-pay

## 2019-11-08 ENCOUNTER — Encounter: Payer: Self-pay | Admitting: Family Medicine

## 2019-11-08 ENCOUNTER — Ambulatory Visit (INDEPENDENT_AMBULATORY_CARE_PROVIDER_SITE_OTHER): Payer: 59 | Admitting: Family Medicine

## 2019-11-08 VITALS — BP 122/78 | HR 97 | Temp 94.8°F | Wt 167.8 lb

## 2019-11-08 DIAGNOSIS — E291 Testicular hypofunction: Secondary | ICD-10-CM

## 2019-11-08 DIAGNOSIS — R739 Hyperglycemia, unspecified: Secondary | ICD-10-CM

## 2019-11-08 DIAGNOSIS — M791 Myalgia, unspecified site: Secondary | ICD-10-CM | POA: Diagnosis not present

## 2019-11-08 DIAGNOSIS — G629 Polyneuropathy, unspecified: Secondary | ICD-10-CM

## 2019-11-08 DIAGNOSIS — R202 Paresthesia of skin: Secondary | ICD-10-CM

## 2019-11-08 NOTE — Progress Notes (Signed)
Patient: Shawn Crawford Male    DOB: 09-23-1961   59 y.o.   MRN: [id number] Visit Date: 11/08/2019  Today's Provider: [name], MD   Chief Complaint  Patient presents with  . Hyperglycemia  . Joint Pain   Subjective:     HPI   Patient having leg and feet pain, patient also having redness and heat in ankles at night says that it has been going on for a while.  He states he is having some muscle aches and weakness in his upper legs and thighs.  His ankles and feet are intermittently turned red according to the patient.  No claudication symptoms. He has chronic radicular symptoms treated by pain clinic.  Hyperlipidemia, unspecified hyperlipidemia type From 06/22/2019-Labs checked, okay.  Hyperglycemia From 06/22/2019-Labs checked, okay. Hemoglobin A1c 5.8.  Arteriosclerosis of coronary artery From 06/22/2019-All risk factors treated.  Clinically stable  Lumbar radiculopathy From 06/22/2019-Per pain clinic.  Allergies  Allergen Reactions  . Penicillins Itching and Other (See Comments)    Did it involve swelling of the face/tongue/throat, SOB, or low BP? No Did it involve sudden or severe rash/hives, skin peeling, or any reaction on the inside of your mouth or nose? No Did you need to seek medical attention at a hospital or doctor's office? Yes When did it last happen? Childhood allergy If all above answers are "NO", may proceed with cephalosporin use.      Current Outpatient Medications:  .  amLODipine (NORVASC) 5 MG tablet, TAKE 1 TABLET BY MOUTH  DAILY, Disp: 90 tablet, Rfl: 3 .  aspirin EC 81 MG tablet, Take 81 mg by mouth daily., Disp: , Rfl:  .  Buprenorphine HCl (BELBUCA) 450 MCG FILM, Place 1 Film inside cheek 2 (two) times daily., Disp: 60 each, Rfl: 3 .  metoprolol tartrate (LOPRESSOR) 25 MG tablet, Take 0.5 tablets (12.5 mg total) by mouth 2 (two) times daily. (Patient not taking: Reported on 08/09/2019), Disp: 60 tablet, Rfl: 1 .  MULTIPLE  VITAMINS PO, Take 1 tablet by mouth daily. , Disp: , Rfl:  .  rosuvastatin (CRESTOR) 40 MG tablet, TAKE 1 TABLET BY MOUTH  DAILY, Disp: 90 tablet, Rfl: 3 .  Testosterone 1.62 % GEL, APPLY TWO PUMPS ONTO THE SKIN ONCE DAILY, Disp: 75 g, Rfl: 5 .  traMADol (ULTRAM) 50 MG tablet, Take 1 tablet (50 mg total) by mouth every 8 (eight) hours as needed for severe pain. Month last 30 days., Disp: 90 tablet, Rfl: 3 .  zolpidem (AMBIEN CR) 12.5 MG CR tablet, Take 1 tablet (12.5 mg total) by mouth at bedtime as needed., Disp: 90 tablet, Rfl: 1  Review of Systems  Constitutional: Negative for appetite change, chills and fever.  Eyes: Negative.   Respiratory: Negative for chest tightness, shortness of breath and wheezing.   Cardiovascular: Negative for chest pain and palpitations.  Gastrointestinal: Negative for abdominal pain, nausea and vomiting.  Genitourinary: Negative.   Neurological: Positive for weakness and numbness.  Psychiatric/Behavioral: Negative.     Social History   Tobacco Use  . Smoking status: Never Smoker  . Smokeless tobacco: Never Used  Substance Use Topics  . Alcohol use: Yes    Alcohol/week: 0.0 standard drinks    Comment: glass of wine occasionally      Objective:   There were no vitals taken for this visit. There were no vitals filed for this visit.There is no height or weight on file to calculate BMI.  Physical Exam Vitals reviewed.  Constitutional:      Appearance: Normal appearance.  HENT:     Head: Normocephalic and atraumatic.     Right Ear: External ear normal.     Left Ear: External ear normal.  Eyes:     General: No scleral icterus. Cardiovascular:     Rate and Rhythm: Normal rate and regular rhythm.     Pulses: Normal pulses.     Heart sounds: Normal heart sounds.  Pulmonary:     Breath sounds: Normal breath sounds.  Abdominal:     Palpations: Abdomen is soft.  Musculoskeletal:        General: No tenderness.  Skin:    General: Skin is warm  and dry.  Neurological:     General: No focal deficit present.     Mental Status: He is alert and oriented to person, place, and time.  Psychiatric:        Mood and Affect: Mood normal.        Behavior: Behavior normal.        Thought Content: Thought content normal.        Judgment: Judgment normal.      No results found for any visits on 11/08/19.     Assessment & Plan    1. Neuropathy I think neuropathy is what the patient is describing with the symptom.  At this time obtain lab work.  Patient is a nondrinker.  This problem has become progressive over the past several months.  Advised patient to try taking vitamin B12 daily. - Testosterone,Free and Total - CK (Creatine Kinase) - B12 - HgB A1c  2. Hypogonadism in male  - Testosterone,Free and Total - CK (Creatine Kinase) - B12 - HgB A1c  3. Myalgia Stop Crestor and return to clinic 3 to 4 weeks although I did not think the statin is the cause of any of the symptoms. - Testosterone,Free and Total - CK (Creatine Kinase) - B12 - HgB A1c  4. Tingling Neuropathy.  May need further neurologic work-up. - Testosterone,Free and Total - CK (Creatine Kinase) - B12 - HgB A1c  5. Hyperglycemia  - Testosterone,Free and Total - CK (Creatine Kinase) - B12 - HgB A1c 6.s/p CABG    [name], MD  [facility]

## 2019-11-08 NOTE — Patient Instructions (Addendum)
TAKE B12 DAILY OTC!!!   STOP CRESTOR

## 2019-11-11 ENCOUNTER — Telehealth: Payer: Self-pay

## 2019-11-11 LAB — TESTOSTERONE,FREE AND TOTAL
Testosterone, Free: 5.1 pg/mL — ABNORMAL LOW (ref 7.2–24.0)
Testosterone: 322 ng/dL (ref 264–916)

## 2019-11-11 LAB — HEMOGLOBIN A1C
Est. average glucose Bld gHb Est-mCnc: 114 mg/dL
Hgb A1c MFr Bld: 5.6 % (ref 4.8–5.6)

## 2019-11-11 LAB — VITAMIN B12: Vitamin B-12: 642 pg/mL (ref 232–1245)

## 2019-11-11 LAB — CK: Total CK: 94 U/L (ref 41–331)

## 2019-11-11 NOTE — Telephone Encounter (Signed)
-----   Message from Jerrol Banana., MD sent at 11/11/2019  2:51 PM EST ----- Labs in normal range.  We may consider changing his testosterone treatment when he comes back in his it is on the low end of normal.

## 2019-11-11 NOTE — Telephone Encounter (Signed)
Patient advised.

## 2019-11-28 ENCOUNTER — Other Ambulatory Visit: Payer: Self-pay | Admitting: Family Medicine

## 2019-11-28 NOTE — Telephone Encounter (Signed)
Requested medication (s) are due for refill today: yes  Requested medication (s) are on the active medication list: yes  Last refill:  09/03/20  Future visit scheduled: yes  Notes to clinic:  not delegated    Requested Prescriptions  Pending Prescriptions Disp Refills   zolpidem (AMBIEN CR) 12.5 MG CR tablet [Pharmacy Med Name: ZOLPIDEM TARTRATE ER 12.5 MG TAB] 30 tablet     Sig: TAKE ONE TABLET BY MOUTH AT BEDTIME AS NEEDED FOR SLEEP. (STOP ZOLPIDEM 10 MG)      Not Delegated - Psychiatry:  Anxiolytics/Hypnotics Failed - 11/28/2019  2:04 PM      Failed - This refill cannot be delegated      Failed - Urine Drug Screen completed in last 360 days.      Passed - Valid encounter within last 6 months    Recent Outpatient Visits           2 weeks ago Neuropathy   [facility] Jerrol Banana., MD   5 months ago Hyperlipidemia, unspecified hyperlipidemia type   [facility] Jerrol Banana., MD   7 months ago Annual physical exam   [facility] Jerrol Banana., MD   1 year ago Anal fissure s/p partial internal anal sphincterotomy 11/11/2018   [facility] Jerrol Banana., MD   1 year ago [place] Jerrol Banana., MD       Future Appointments             In 2 weeks Jerrol Banana., MD [facility], [place]

## 2019-12-05 ENCOUNTER — Telehealth: Payer: Self-pay

## 2019-12-05 NOTE — Telephone Encounter (Signed)
Attempted to call   Patient at work.  Was told he was in a meeting.  I did not leave a message with the person who answered the phone.  We will need to call back again later.

## 2019-12-06 ENCOUNTER — Encounter: Payer: Self-pay | Admitting: Student in an Organized Health Care Education/Training Program

## 2019-12-06 ENCOUNTER — Ambulatory Visit
Payer: 59 | Attending: Student in an Organized Health Care Education/Training Program | Admitting: Student in an Organized Health Care Education/Training Program

## 2019-12-06 ENCOUNTER — Other Ambulatory Visit: Payer: Self-pay

## 2019-12-06 DIAGNOSIS — M7918 Myalgia, other site: Secondary | ICD-10-CM | POA: Diagnosis not present

## 2019-12-06 DIAGNOSIS — G894 Chronic pain syndrome: Secondary | ICD-10-CM | POA: Diagnosis not present

## 2019-12-06 DIAGNOSIS — M47816 Spondylosis without myelopathy or radiculopathy, lumbar region: Secondary | ICD-10-CM

## 2019-12-06 DIAGNOSIS — M5416 Radiculopathy, lumbar region: Secondary | ICD-10-CM

## 2019-12-06 DIAGNOSIS — M4726 Other spondylosis with radiculopathy, lumbar region: Secondary | ICD-10-CM

## 2019-12-06 MED ORDER — BELBUCA 450 MCG BU FILM: 1.0000 | ORAL_FILM | Freq: Two times a day (BID) | BUCCAL | 3 refills | Status: DC

## 2019-12-06 MED ORDER — TRAMADOL HCL 50 MG PO TABS: 50.0000 mg | ORAL_TABLET | Freq: Three times a day (TID) | ORAL | 3 refills | Status: DC | PRN

## 2019-12-06 NOTE — Progress Notes (Signed)
Patient: Shawn Crawford  Service Category: E/M  Provider: Gillis Santa, MD  DOB: [birth year]  DOS: 12/06/2019  Location: Office  MRN: [id number]  Setting: Ambulatory outpatient  Referring Provider: Jerrol Banana.,*  Type: Established Patient  Specialty: Interventional Pain Management  PCP: Jerrol Banana., MD  Location: Home  Delivery: TeleHealth     Virtual Encounter - Pain Management PROVIDER NOTE: Information contained herein reflects review and annotations entered in association with encounter. Interpretation of such information and data should be left to medically-trained personnel. Information provided to patient can be located elsewhere in the medical record under "Patient Instructions". Document created using STT-dictation technology, any transcriptional errors that may result from process are unintentional.    Contact & Pharmacy Preferred: [phone] Home: [phone] (home) Mobile: [phone] (mobile) E-mail: [email or web address]  [place], [place] [address] [place] [address] Phone: [phone] Fax: [phone]  [place] [address] [place] [address] [place] [address] Phone: [phone] Fax: [phone]   Pre-screening  Shawn Crawford offered "in-person" vs "virtual" encounter. He indicated preferring virtual for this encounter.   Reason COVID-19*  Social distancing based on CDC and AMA recommendations.   I contacted Shawn Crawford on 12/06/2019 via telephone.      I clearly identified myself as Gillis Santa, MD. I verified that I was speaking with the correct person using two identifiers (Name: Shawn Crawford, and date of birth: 11-Sep-1961).  This visit was completed via telephone due to the restrictions of the COVID-19 pandemic. All issues as above were discussed and addressed but no physical exam was performed. If it was felt that the patient should  be evaluated in the office, they were directed there. The patient verbally consented to this visit. Patient was unable to complete an audio/visual visit due to Technical difficulties and/or Lack of internet. Due to the catastrophic nature of the COVID-19 pandemic, this visit was done through audio contact only.  Location of the patient: home address (see Epic for details)  Location of the provider: office  Consent I sought verbal advanced consent from Shawn Crawford for virtual visit interactions. I informed Shawn Crawford of possible security and privacy concerns, risks, and limitations associated with providing "not-in-person" medical evaluation and management services. I also informed Shawn Crawford of the availability of "in-person" appointments. Finally, I informed him that there would be a charge for the virtual visit and that he could be  personally, fully or partially, financially responsible for it. Shawn Crawford expressed understanding and agreed to proceed.   Historic Elements   Shawn Crawford is a 59 y.o. year old, male patient evaluated today after his last contact with our practice on 12/05/2019. Shawn Crawford  has a past medical history of cardiac catheterization, Hyperlipidemia, and Hypertension. He also  has a past surgical history that includes Coronary artery bypass graft; Hernia repair; Appendectomy; Cardiac catheterization; Colonoscopy with propofol (N/A, 06/24/2018); Esophagogastroduodenoscopy (egd) with propofol (N/A, 06/24/2018); and Exam under anesthesia with hemorrhoidectomy (N/A, 11/11/2018). Shawn Crawford has a current medication list which includes the following prescription(s): amlodipine, aspirin ec, [START ON 12/15/2019] belbuca, multiple vitamin, rosuvastatin, testosterone, [START ON 12/15/2019] tramadol, and zolpidem. He  reports that he has never smoked. He has never used smokeless tobacco. He reports current alcohol use. He reports that he does not use drugs. Shawn Crawford is allergic to  penicillins.   HPI  Today, he is being contacted for medication management.   No change in medical history since last visit.  Patient's pain is at baseline.  Patient continues multimodal pain regimen as prescribed.  States that it provides pain relief and improvement in functional status.  Pharmacotherapy Assessment  Analgesic: 11/16/2019  3   08/09/2019  Tramadol Hcl 50 MG Tablet  90.00  30 Bi Lat   [id number]   Thr (4878)   3  15.00 MME  Comm Ins   [place]  11/16/2019  3   08/09/2019  Belbuca 450 MCG Film  60.00  30 Bi Lat   [id number]   Thr (4878)   3  0.90 mg  Comm Ins   [place]     Monitoring: [place] PMP: PDMP reviewed during this encounter.       Pharmacotherapy: No side-effects or adverse reactions reported. Compliance: No problems identified. Effectiveness: Clinically acceptable. Plan: Refer to "POC".  UDS:  Summary  Date Value Ref Range Status  08/09/2019 Note  Final    Comment:    ==================================================================== [employer] Select 13 (MW) ==================================================================== Test                             Result       Flag       Units Drug Present and Declared for Prescription Verification   Buprenorphine                  20           EXPECTED   ng/mg creat   Norbuprenorphine               100          EXPECTED   ng/mg creat    Source of buprenorphine is a scheduled prescription medication.    Norbuprenorphine is an expected metabolite of buprenorphine.   Tramadol                       >1667        EXPECTED   ng/mg creat   O-Desmethyltramadol            >1667        EXPECTED   ng/mg creat   N-Desmethyltramadol            771          EXPECTED   ng/mg creat    Source of tramadol is a prescription medication. O-desmethyltramadol    and N-desmethyltramadol are expected metabolites of tramadol. ==================================================================== Test                      Result    Flag   Units      Ref  Range   Creatinine              300              mg/dL      >=20 ==================================================================== Declared Medications:  The flagging and interpretation on this report are based on the  following declared medications.  Unexpected results may arise from  inaccuracies in the declared medications.  **Note: The testing scope of this panel includes these medications:  Buprenorphine (Belbuca)  Tramadol (Ultram)  **Note: The testing scope of this panel does not include the  following reported medications:  Amlodipine  Aspirin  Metoprolol (Lopressor)  Multivitamin  Rosuvastatin (Crestor)  Testosterone  Zolpidem (Ambien) ====================================================================  For clinical consultation, please call [phone]. ====================================================================    Laboratory Chemistry Profile   Renal Lab Results  Component Value Date   BUN 13 04/15/2019   CREATININE 0.98 04/15/2019   BCR 13 04/15/2019   GFRAA 98 04/15/2019   GFRNONAA 85 04/15/2019    Hepatic Lab Results  Component Value Date   AST 22 04/15/2019   ALT 22 04/15/2019   ALBUMIN 4.4 04/15/2019   ALKPHOS 72 04/15/2019    Electrolytes Lab Results  Component Value Date   NA 141 04/15/2019   K 4.9 04/15/2019   CL 99 04/15/2019   CALCIUM 9.6 04/15/2019    Bone Lab Results  Component Value Date   TESTOFREE 5.1 (L) 11/09/2019   TESTOSTERONE 322 11/09/2019    Inflammation (CRP: Acute Phase) (ESR: Chronic Phase) Lab Results  Component Value Date   ESRSEDRATE 2 08/06/2018      Note: Above Lab results reviewed.  Imaging  CT Abdomen Pelvis W Contrast CLINICAL DATA:  59 year old male with a history of unexplained weight loss  EXAM: CT CHEST, ABDOMEN, AND PELVIS WITH CONTRAST  TECHNIQUE: Multidetector CT imaging of the chest, abdomen and pelvis was performed following the standard protocol during bolus administration of  intravenous contrast.  CONTRAST:  154m OMNIPAQUE IOHEXOL 300 MG/ML  SOLN  COMPARISON:  None.  FINDINGS: CT CHEST FINDINGS  Cardiovascular:  Heart:  Surgical changes of median sternotomy and CABG. Native coronary calcifications  Aorta:  Unremarkable course, caliber, contour of the thoracic aorta. No aneurysm or dissection flap. No periaortic fluid. Calcifications of the aortic arch. Branch vessels are patent. Descending thoracic aorta with mild atherosclerotic changes.  Pulmonary arteries:  Study is not tailored for the most sensitive evaluation of the pulmonary arteries, however, no central filling defects identified.  Mediastinum/Nodes: Small lymph nodes of the mediastinum. Calcified lymph nodes in the subcarinal and left hilar nodal stations. Unremarkable appearance of the thoracic esophagus.  Unremarkable appearance of the thoracic inlet.  Lungs/Pleura: Central airways are clear. No pleural effusion. No confluent airspace disease.  No pneumothorax.  Musculoskeletal: No acute displaced fracture. Degenerative changes of the spine.  Review of the MIP images confirms the above findings.  CT ABDOMEN PELVIS FINDINGS  Hepatobiliary: Unremarkable appearance of liver. Unremarkable gallbladder  Pancreas: Unremarkable pancreas  Spleen: Punctate calcifications throughout the spleen, compatible with prior granulomatous disease.  Adrenals/Urinary Tract: Unremarkable appearance of the adrenal glands. No evidence of hydronephrosis of the right or left kidney. No nephrolithiasis. Unremarkable course of the bilateral ureters. Unremarkable appearance of the urinary bladder.  Stomach/Bowel: Unremarkable stomach. Unremarkable small bowel. Appendectomy. Moderate stool burden. Colonic diverticular disease without evidence of acute inflammation.  Vascular/Lymphatic: Atherosclerotic changes of the abdominal aorta. Mesenteric arteries and bilateral renal arteries are  patent. Bilateral iliac arteries and proximal femoral arteries patent.  Reproductive: Diameter of the prostate measures 4.4 cm  Other: None  Musculoskeletal: No acute displaced fracture.  Degenerative changes.  IMPRESSION: No acute CT finding.  No finding to account for weight loss.  Surgical changes of median sternotomy and CABG.  Aortic Atherosclerosis (ICD10-I70.0).  Diverticular disease without evidence of acute diverticulitis.  Electronically Signed   By: JCorrie MckusickD.O.   On: 05/06/2018 09:57 CT Chest W Contrast CLINICAL DATA:  59year old male with a history of unexplained weight loss  EXAM: CT CHEST, ABDOMEN, AND PELVIS WITH CONTRAST  TECHNIQUE: Multidetector CT imaging of the chest, abdomen and pelvis was performed following the standard protocol during bolus administration of intravenous contrast.  CONTRAST:  162m OMNIPAQUE IOHEXOL 300 MG/ML  SOLN  COMPARISON:  None.  FINDINGS: CT CHEST FINDINGS  Cardiovascular:  Heart:  Surgical changes of median sternotomy and CABG. Native coronary calcifications  Aorta:  Unremarkable course, caliber, contour of the thoracic aorta. No aneurysm or dissection flap. No periaortic fluid. Calcifications of the aortic arch. Branch vessels are patent. Descending thoracic aorta with mild atherosclerotic changes.  Pulmonary arteries:  Study is not tailored for the most sensitive evaluation of the pulmonary arteries, however, no central filling defects identified.  Mediastinum/Nodes: Small lymph nodes of the mediastinum. Calcified lymph nodes in the subcarinal and left hilar nodal stations. Unremarkable appearance of the thoracic esophagus.  Unremarkable appearance of the thoracic inlet.  Lungs/Pleura: Central airways are clear. No pleural effusion. No confluent airspace disease.  No pneumothorax.  Musculoskeletal: No acute displaced fracture. Degenerative changes of the spine.  Review of the MIP images  confirms the above findings.  CT ABDOMEN PELVIS FINDINGS  Hepatobiliary: Unremarkable appearance of liver. Unremarkable gallbladder  Pancreas: Unremarkable pancreas  Spleen: Punctate calcifications throughout the spleen, compatible with prior granulomatous disease.  Adrenals/Urinary Tract: Unremarkable appearance of the adrenal glands. No evidence of hydronephrosis of the right or left kidney. No nephrolithiasis. Unremarkable course of the bilateral ureters. Unremarkable appearance of the urinary bladder.  Stomach/Bowel: Unremarkable stomach. Unremarkable small bowel. Appendectomy. Moderate stool burden. Colonic diverticular disease without evidence of acute inflammation.  Vascular/Lymphatic: Atherosclerotic changes of the abdominal aorta. Mesenteric arteries and bilateral renal arteries are patent. Bilateral iliac arteries and proximal femoral arteries patent.  Reproductive: Diameter of the prostate measures 4.4 cm  Other: None  Musculoskeletal: No acute displaced fracture.  Degenerative changes.  IMPRESSION: No acute CT finding.  No finding to account for weight loss.  Surgical changes of median sternotomy and CABG.  Aortic Atherosclerosis (ICD10-I70.0).  Diverticular disease without evidence of acute diverticulitis.  Electronically Signed   By: JCorrie MckusickD.O.   On: 05/06/2018 09:57  Assessment  The primary encounter diagnosis was Chronic pain syndrome. Diagnoses of Lumbar radiculopathy, Lumbar facet arthropathy, and Piriformis muscle pain were also pertinent to this visit.  Plan of Care   Mr. DRubert Fredianihas a current medication list which includes the following long-term medication(s): amlodipine, rosuvastatin, testosterone, and zolpidem.  Pharmacotherapy (Medications Ordered): Meds ordered this encounter  Medications  . Buprenorphine HCl (BELBUCA) 450 MCG FILM    Sig: Place 1 Film (450 mcg total) inside cheek 2 (two) times daily. For chronic  pain syndrome    Dispense:  60 each    Refill:  3  . traMADol (ULTRAM) 50 MG tablet    Sig: Take 1 tablet (50 mg total) by mouth every 8 (eight) hours as needed for severe pain. Month last 30 days.    Dispense:  90 tablet    Refill:  3    [place] STOP ACT - Not applicable. Fill one day early if pharmacy is closed on scheduled refill date. .   Follow-up plan:   Return in about 4 months (around 04/06/2020) for Medication Management, in person.    Recent Visits No visits were found meeting these conditions.  Showing recent visits within past 90 days and meeting all other requirements   Today's Visits Date Type Provider Dept  12/06/19 Office Visit LGillis Santa MD [facility]  Showing today's visits and meeting all other requirements   Future Appointments No visits were found meeting these conditions.  Showing future appointments within next 90 days and meeting all other  requirements   I discussed the assessment and treatment plan with the patient. The patient was provided an opportunity to ask questions and all were answered. The patient agreed with the plan and demonstrated an understanding of the instructions.  Patient advised to call back or seek an in-person evaluation if the symptoms or condition worsens.  Duration of encounter: 25 minutes.  Note by: Gillis Santa, MD Date: 12/06/2019; Time: 9:24 AM

## 2019-12-12 NOTE — Progress Notes (Signed)
Patient: Shawn Crawford Male    DOB: 1961-07-24   59 y.o.   MRN: [id number] Visit Date: 12/13/2019  Today's Provider: [name], MD   Chief Complaint  Patient presents with  . Follow-up   Subjective:     HPI  The patient feels no better since stopping his Crestor.  When he is able to sleep he states his back feels better.  Seems to be a good bit of muscle spasm that causes him pains. Neuropathy From 11/08/2019-Advised patient to try taking vitamin B12 daily. Labs checked showing-in normal range.   Hypogonadism in male From 11/08/2019-Labs checked showing-in normal range.   Myalgia From 11/08/2019-advised to stop Crestor and return to clinic 3 to 4 weeks although I did not think the statin is the cause of any of the symptoms.  Tingling From 11/08/2019-Neuropathy.  May need further neurologic work-up.   Allergies  Allergen Reactions  . Penicillins Itching and Other (See Comments)    Did it involve swelling of the face/tongue/throat, SOB, or low BP? No Did it involve sudden or severe rash/hives, skin peeling, or any reaction on the inside of your mouth or nose? No Did you need to seek medical attention at a hospital or doctor's office? Yes When did it last happen? Childhood allergy If all above answers are "NO", may proceed with cephalosporin use.      Current Outpatient Medications:  .  amLODipine (NORVASC) 5 MG tablet, TAKE 1 TABLET BY MOUTH  DAILY, Disp: 90 tablet, Rfl: 3 .  aspirin EC 81 MG tablet, Take 81 mg by mouth daily., Disp: , Rfl:  .  [START ON 12/15/2019] Buprenorphine HCl (BELBUCA) 450 MCG FILM, Place 1 Film (450 mcg total) inside cheek 2 (two) times daily. For chronic pain syndrome, Disp: 60 each, Rfl: 3 .  MULTIPLE VITAMINS PO, Take 1 tablet by mouth daily. , Disp: , Rfl:  .  rosuvastatin (CRESTOR) 40 MG tablet, TAKE 1 TABLET BY MOUTH  DAILY, Disp: 90 tablet, Rfl: 3 .  Testosterone 1.62 % GEL, APPLY TWO PUMPS ONTO THE SKIN ONCE DAILY, Disp:  75 g, Rfl: 5 .  [START ON 12/15/2019] traMADol (ULTRAM) 50 MG tablet, Take 1 tablet (50 mg total) by mouth every 8 (eight) hours as needed for severe pain. Month last 30 days., Disp: 90 tablet, Rfl: 3 .  zolpidem (AMBIEN CR) 12.5 MG CR tablet, TAKE ONE TABLET BY MOUTH AT BEDTIME AS NEEDED FOR SLEEP. (STOP ZOLPIDEM 10 MG), Disp: 30 tablet, Rfl: 5  Review of Systems  Constitutional: Negative.   Eyes: Negative.   Respiratory: Negative.   Cardiovascular: Negative.   Gastrointestinal: Negative.   Endocrine: Negative.   Genitourinary: Positive for difficulty urinating.  Allergic/Immunologic: Negative.   Hematological: Negative.   Psychiatric/Behavioral: Negative.     Social History   Tobacco Use  . Smoking status: Never Smoker  . Smokeless tobacco: Never Used  Substance Use Topics  . Alcohol use: Yes    Alcohol/week: 0.0 standard drinks    Comment: glass of wine occasionally      Objective:   BP 118/75 (BP Location: Left Arm, Patient Position: Sitting, Cuff Size: Large)   Pulse 87   Temp (!) 97.5 F (36.4 C) (Other (Comment))   Resp 16   Ht 5\' 11"  (1.803 m)   Wt 169 lb (76.7 kg)   SpO2 97%   BMI 23.57 kg/m  Vitals:   12/13/19 1418  BP: 118/75  Pulse: 87  Resp: 16  Temp: (!) 97.5 F (36.4 C)  TempSrc: Other (Comment)  SpO2: 97%  Weight: 169 lb (76.7 kg)  Height: 5\' 11"  (1.803 m)  Body mass index is 23.57 kg/m.   Physical Exam Vitals reviewed.  Constitutional:      Appearance: Normal appearance.  HENT:     Head: Normocephalic and atraumatic.     Right Ear: External ear normal.     Left Ear: External ear normal.  Eyes:     General: No scleral icterus. Cardiovascular:     Rate and Rhythm: Normal rate and regular rhythm.     Pulses: Normal pulses.     Heart sounds: Normal heart sounds.  Pulmonary:     Breath sounds: Normal breath sounds.  Abdominal:     Palpations: Abdomen is soft.  Musculoskeletal:        General: No tenderness.  Skin:    General: Skin  is warm and dry.  Neurological:     General: No focal deficit present.     Mental Status: He is alert and oriented to person, place, and time.  Psychiatric:        Mood and Affect: Mood normal.        Behavior: Behavior normal.        Thought Content: Thought content normal.        Judgment: Judgment normal.      No results found for any visits on 12/13/19.     Assessment & Plan    1. Lumbar radiculopathy   2. Piriformis muscle pain  - diazepam (VALIUM) 5 MG tablet; Take 1 tablet (5 mg total) by mouth at bedtime as needed for anxiety.  Dispense: 30 tablet; Refill: 5  3. Chronic pain syndrome At this time after discussion with patient will try diazepam 5 mg at bedtime and stop Ambien.  I will see him back in 1 to 2 months to see how he is doing with sleep and with his back pain and muscle spasm. - diazepam (VALIUM) 5 MG tablet; Take 1 tablet (5 mg total) by mouth at bedtime as needed for anxiety.  Dispense: 30 tablet; Refill: 5  4. Psychophysiological insomnia  - diazepam (VALIUM) 5 MG tablet; Take 1 tablet (5 mg total) by mouth at bedtime as needed for anxiety.  Dispense: 30 tablet; Refill: 5  5. Hypogonadism in male At this time stop testosterone gel and try Clomid 50 mg 1/2 tablet daily.  We will see how he is feeling in a couple of months and also recheck testosterone  level in 3 to 4 months - clomiPHENE (CLOMID) 50 MG tablet; Take 0.5 tablets (25 mg total) by mouth daily.  Dispense: 30 tablet; Refill: 5 6.  Status post CABG All risk factors treated.  Restart Crestor   FOLLOW UP IN 1-2 MONTHS.     I,Kyelle Urbas,acting as a scribe for [name], MD.,have documented all relevant documentation on the behalf of [name], MD,as directed by  [name], MD while in the presence of [name], MD.     [name], MD  [facility]

## 2019-12-13 ENCOUNTER — Other Ambulatory Visit: Payer: Self-pay

## 2019-12-13 ENCOUNTER — Ambulatory Visit (INDEPENDENT_AMBULATORY_CARE_PROVIDER_SITE_OTHER): Payer: 59 | Admitting: Family Medicine

## 2019-12-13 ENCOUNTER — Encounter: Payer: Self-pay | Admitting: Family Medicine

## 2019-12-13 VITALS — BP 118/75 | HR 87 | Temp 97.5°F | Resp 16 | Ht 71.0 in | Wt 169.0 lb

## 2019-12-13 DIAGNOSIS — M5416 Radiculopathy, lumbar region: Secondary | ICD-10-CM | POA: Diagnosis not present

## 2019-12-13 DIAGNOSIS — F5104 Psychophysiologic insomnia: Secondary | ICD-10-CM | POA: Diagnosis not present

## 2019-12-13 DIAGNOSIS — M7918 Myalgia, other site: Secondary | ICD-10-CM

## 2019-12-13 DIAGNOSIS — G894 Chronic pain syndrome: Secondary | ICD-10-CM | POA: Diagnosis not present

## 2019-12-13 DIAGNOSIS — I2510 Atherosclerotic heart disease of native coronary artery without angina pectoris: Secondary | ICD-10-CM

## 2019-12-13 DIAGNOSIS — E291 Testicular hypofunction: Secondary | ICD-10-CM

## 2019-12-13 MED ORDER — DIAZEPAM 5 MG PO TABS: 5.0000 mg | ORAL_TABLET | Freq: Every evening | ORAL | 5 refills | Status: DC | PRN

## 2019-12-13 MED ORDER — CLOMIPHENE CITRATE 50 MG PO TABS: 25.0000 mg | ORAL_TABLET | Freq: Every day | ORAL | 5 refills | Status: DC

## 2019-12-13 NOTE — Patient Instructions (Addendum)
-  RESTART Crestor.  -STOP/FINISH Testosterone gel. Then START Clomid 50 mg, Take 0.5 tablets (25 mg total) by mouth daily.  -STOP Ambien.  -START DIAZEPAM 5 MG, Take 1 tablet (5 mg total) by mouth at bedtime as needed for anxiety.

## 2019-12-22 ENCOUNTER — Encounter: Payer: Self-pay | Admitting: Family Medicine

## 2020-01-18 NOTE — Progress Notes (Signed)
Established patient visit    Patient: Shawn Crawford   DOB: 11/26/1960   59 y.o. Male  MRN: [id number] Visit Date: 01/24/2020  Today's healthcare provider: [name], MD   Chief Complaint  Patient presents with  . Chronic Pain Syndrome   Subjective    HPI  Last visit we started patient on Clomid 50 mg 1/2 tablet daily. In the interim he has a good bit more energy and his pain level is decreased. This is just on the Clomid. Patient presents today in office for a follow up on chronic pain syndrome. Patient stopped taking diazapam and still taking ambien.   Myalgia From 11/08/2019-Stopped Crestor and advised to return to clinic 3 to 4 weeks although I did not think the statin is the cause of any of the symptoms.  Chronic pain syndrome From 11/08/2019-At this time after discussion with patient will try diazepam 5 mg at bedtime and stop Ambien.  I will see him back in 1 to 2 months to see how he is doing with sleep and with his back pain and muscle spasm. - diazepam (VALIUM) 5 MG tablet; Take 1 tablet (5 mg total) by mouth at bedtime as needed for anxiety.  Dispense: 30 tablet; Refill: 5  Hypogonadism in male From 11/08/2019-stopped testosterone gel and advised to try Clomid 50 mg 1/2 tablet daily.  We will see how he is feeling in a couple of months and also recheck testosterone  level in 3 to 4 months. Labs checked showing-Labs in normal range. We may consider changing his testosterone treatment when he comes back in his it is on the low end of normal.  Status post CABG All risk factors treated.  Restart Crestor    Patient Active Problem List   Diagnosis Date Noted  . Special screening for malignant neoplasms, colon   . Benign neoplasm of descending colon   . Diverticulosis of large intestine without diverticulitis   . Anal fissure s/p partial internal anal sphincterotomy 11/11/2018   . Melanosis of colon   . Internal hemorrhoids   . Lumbar degenerative disc  disease 04/06/2018  . Piriformis muscle pain 04/06/2018  . Chronic pain syndrome 10/22/2017  . Chronic midline low back pain without sciatica 10/22/2017  . Lumbar facet arthropathy 10/22/2017  . Hyperglycemia 05/30/2015  . Abnormal blood sugar 05/10/2015  . Arteriosclerosis of coronary artery 05/10/2015  . ED (erectile dysfunction) of organic origin 05/10/2015  . Acid reflux 05/10/2015  . HLD (hyperlipidemia) 05/10/2015  . BP (high blood pressure) 05/10/2015  . Lumbar radiculopathy 05/10/2015  . Avitaminosis D 05/10/2015  . Benign essential HTN 05/07/2015       Medications: Outpatient Medications Prior to Visit  Medication Sig  . amLODipine (NORVASC) 5 MG tablet TAKE 1 TABLET BY MOUTH  DAILY  . aspirin EC 81 MG tablet Take 81 mg by mouth daily.  . Buprenorphine HCl (BELBUCA) 450 MCG FILM Place 1 Film (450 mcg total) inside cheek 2 (two) times daily. For chronic pain syndrome  . clomiPHENE (CLOMID) 50 MG tablet Take 0.5 tablets (25 mg total) by mouth daily.  . MULTIPLE VITAMINS PO Take 1 tablet by mouth daily.   . rosuvastatin (CRESTOR) 40 MG tablet TAKE 1 TABLET BY MOUTH  DAILY  . Testosterone 1.62 % GEL APPLY TWO PUMPS ONTO THE SKIN ONCE DAILY  . traMADol (ULTRAM) 50 MG tablet Take 1 tablet (50 mg total) by mouth every 8 (eight) hours as needed for severe pain. Month last  30 days.  Marland Kitchen zolpidem (AMBIEN CR) 12.5 MG CR tablet TAKE ONE TABLET BY MOUTH AT BEDTIME AS NEEDED FOR SLEEP. (STOP ZOLPIDEM 10 MG)  . diazepam (VALIUM) 5 MG tablet Take 1 tablet (5 mg total) by mouth at bedtime as needed for anxiety. (Patient not taking: Reported on 01/24/2020)   No facility-administered medications prior to visit.    Review of Systems  Constitutional: Negative for appetite change, chills and fever.  Eyes: Negative.   Respiratory: Negative for chest tightness, shortness of breath and wheezing.   Cardiovascular: Negative for chest pain and palpitations.  Gastrointestinal: Negative for abdominal  pain, nausea and vomiting.  Endocrine: Negative.   Musculoskeletal: Positive for back pain.  Allergic/Immunologic: Negative.   Psychiatric/Behavioral: Negative.         Objective    BP 122/74 (BP Location: Right Arm, Patient Position: Sitting, Cuff Size: Normal)   Pulse 87   Temp (!) 96.2 F (35.7 C) (Temporal)   Ht 5\' 11"  (1.803 m)   Wt 165 lb 6.4 oz (75 kg)   SpO2 98%   BMI 23.07 kg/m  BP Readings from Last 3 Encounters:  01/24/20 122/74  12/13/19 118/75  11/08/19 122/78   Wt Readings from Last 3 Encounters:  01/24/20 165 lb 6.4 oz (75 kg)  12/13/19 169 lb (76.7 kg)  11/08/19 167 lb 12.8 oz (76.1 kg)      Physical Exam Vitals reviewed.  Constitutional:      Appearance: Normal appearance.  HENT:     Head: Normocephalic and atraumatic.     Right Ear: External ear normal.     Left Ear: External ear normal.  Eyes:     General: No scleral icterus. Cardiovascular:     Rate and Rhythm: Normal rate and regular rhythm.     Pulses: Normal pulses.     Heart sounds: Normal heart sounds.  Pulmonary:     Breath sounds: Normal breath sounds.  Abdominal:     Palpations: Abdomen is soft.  Musculoskeletal:        General: No tenderness.  Skin:    General: Skin is warm and dry.  Neurological:     General: No focal deficit present.     Mental Status: He is alert and oriented to person, place, and time.  Psychiatric:        Mood and Affect: Mood normal.        Behavior: Behavior normal.        Thought Content: Thought content normal.        Judgment: Judgment normal.       No results found for any visits on 01/24/20.   Assessment & Plan    1. Hypogonadism in male Feels better on Clomid. We will see him back this summer and check a testosterone level.  2. Lumbar degenerative disc disease Pain improved. He is also sees pain clinic  3. Lumbar facet arthropathy   4. Arteriosclerosis of coronary artery All risk factors treated   No follow-ups on file.          Apoorva Bugay Cranford Mon, MD  [facility] [phone] (phone) [phone] (fax)  [place]

## 2020-01-24 ENCOUNTER — Encounter: Payer: Self-pay | Admitting: Family Medicine

## 2020-01-24 ENCOUNTER — Ambulatory Visit (INDEPENDENT_AMBULATORY_CARE_PROVIDER_SITE_OTHER): Payer: 59 | Admitting: Family Medicine

## 2020-01-24 ENCOUNTER — Other Ambulatory Visit: Payer: Self-pay

## 2020-01-24 VITALS — BP 122/74 | HR 87 | Temp 96.2°F | Ht 71.0 in | Wt 165.4 lb

## 2020-01-24 DIAGNOSIS — M5136 Other intervertebral disc degeneration, lumbar region: Secondary | ICD-10-CM

## 2020-01-24 DIAGNOSIS — I2510 Atherosclerotic heart disease of native coronary artery without angina pectoris: Secondary | ICD-10-CM

## 2020-01-24 DIAGNOSIS — E291 Testicular hypofunction: Secondary | ICD-10-CM | POA: Diagnosis not present

## 2020-01-24 DIAGNOSIS — M47816 Spondylosis without myelopathy or radiculopathy, lumbar region: Secondary | ICD-10-CM | POA: Diagnosis not present

## 2020-02-28 ENCOUNTER — Other Ambulatory Visit: Payer: Self-pay | Admitting: Family Medicine

## 2020-02-28 DIAGNOSIS — I10 Essential (primary) hypertension: Secondary | ICD-10-CM

## 2020-02-28 NOTE — Telephone Encounter (Signed)
Requested Prescriptions  Pending Prescriptions Disp Refills  . amLODipine (NORVASC) 5 MG tablet [Pharmacy Med Name: AMLODIPINE  5MG   TAB] 90 tablet 1    Sig: TAKE 1 TABLET BY MOUTH  DAILY     Cardiovascular:  Calcium Channel Blockers Passed - 02/28/2020  9:21 PM      Passed - Last BP in normal range    BP Readings from Last 1 Encounters:  01/24/20 122/74         Passed - Valid encounter within last 6 months    Recent Outpatient Visits          1 month ago Hypogonadism in male   [place], Retia Passe., MD   2 months ago Lumbar radiculopathy   [facility] Jerrol Banana., MD   3 months ago Neuropathy   [facility] Jerrol Banana., MD   8 months ago Hyperlipidemia, unspecified hyperlipidemia type   [facility] Jerrol Banana., MD   10 months ago Annual physical exam   [facility] Jerrol Banana., MD

## 2020-03-15 ENCOUNTER — Other Ambulatory Visit: Payer: Self-pay | Admitting: Student in an Organized Health Care Education/Training Program

## 2020-03-15 DIAGNOSIS — G894 Chronic pain syndrome: Secondary | ICD-10-CM

## 2020-03-19 ENCOUNTER — Other Ambulatory Visit: Payer: Self-pay | Admitting: Student in an Organized Health Care Education/Training Program

## 2020-03-19 DIAGNOSIS — G894 Chronic pain syndrome: Secondary | ICD-10-CM

## 2020-03-27 ENCOUNTER — Encounter: Payer: Self-pay | Admitting: Student in an Organized Health Care Education/Training Program

## 2020-03-27 ENCOUNTER — Other Ambulatory Visit: Payer: Self-pay

## 2020-03-27 ENCOUNTER — Ambulatory Visit
Payer: 59 | Attending: Student in an Organized Health Care Education/Training Program | Admitting: Student in an Organized Health Care Education/Training Program

## 2020-03-27 VITALS — BP 123/94 | HR 99 | Temp 98.4°F | Resp 16 | Ht 71.0 in | Wt 161.0 lb

## 2020-03-27 DIAGNOSIS — G894 Chronic pain syndrome: Secondary | ICD-10-CM | POA: Insufficient documentation

## 2020-03-27 DIAGNOSIS — M5416 Radiculopathy, lumbar region: Secondary | ICD-10-CM | POA: Diagnosis not present

## 2020-03-27 DIAGNOSIS — M5136 Other intervertebral disc degeneration, lumbar region: Secondary | ICD-10-CM | POA: Insufficient documentation

## 2020-03-27 DIAGNOSIS — M7918 Myalgia, other site: Secondary | ICD-10-CM | POA: Diagnosis present

## 2020-03-27 DIAGNOSIS — M47816 Spondylosis without myelopathy or radiculopathy, lumbar region: Secondary | ICD-10-CM | POA: Insufficient documentation

## 2020-03-27 MED ORDER — TRAMADOL HCL 50 MG PO TABS: 50.0000 mg | ORAL_TABLET | Freq: Three times a day (TID) | ORAL | 3 refills | Status: DC | PRN

## 2020-03-27 MED ORDER — BELBUCA 450 MCG BU FILM: 1.0000 | ORAL_FILM | Freq: Two times a day (BID) | BUCCAL | 3 refills | Status: DC

## 2020-03-27 NOTE — Progress Notes (Signed)
Nursing Pain Medication Assessment:  Safety precautions to be maintained throughout the outpatient stay will include: orient to surroundings, keep bed in low position, maintain call bell within reach at all times, provide assistance with transfer out of bed and ambulation.  Medication Inspection Compliance: Shawn Crawford did not comply with our request to bring his pills to be counted. He was reminded that bringing the medication bottles, even when empty, is a requirement.  Medication: Belbuca Pill/Patch Count: No pills available to be counted. Pill/Patch Appearance: none brought in Bottle Appearance: No container available. Did not bring bottle(s) to appointment. Filled Date: 06 / 15 / 2021 Last Medication intake:  Today

## 2020-03-27 NOTE — Progress Notes (Signed)
PROVIDER NOTE: Information contained herein reflects review and annotations entered in association with encounter. Interpretation of such information and data should be left to medically-trained personnel. Information provided to patient can be located elsewhere in the medical record under "Patient Instructions". Document created using STT-dictation technology, any transcriptional errors that may result from process are unintentional.    Patient: Gwendalyn Ege  Service Category: E/M  Provider: Gillis Santa, MD  DOB: [birth year]  DOS: 03/27/2020  Specialty: Interventional Pain Management  MRN: [id number]  Setting: Ambulatory outpatient  PCP: Jerrol Banana., MD  Type: Established Patient    Referring Provider: Jerrol Banana.,*  Location: Office  Delivery: Face-to-face     HPI  Reason for encounter: Mr. Pryor Guettler, a 59 y.o. year old male, is here today for evaluation and management of his Lumbar radiculopathy [M54.16]. Mr. Pattillo primary complain today is Back Pain Last encounter: Practice (03/19/2020). My last encounter with him was on 03/19/2020. Pertinent problems: Mr. Dowty has Arteriosclerosis of coronary artery; Lumbar radiculopathy; Benign essential HTN; Chronic pain syndrome; Chronic midline low back pain without sciatica; Lumbar facet arthropathy; Lumbar degenerative disc disease; and Piriformis muscle pain on their pertinent problem list. Pain Assessment: Severity of Chronic pain is reported as a 1 /10. Location: Back Lower/both legs.. Onset: More than a month ago. Quality: Aching, Constant. Timing: Constant. Modifying factor(s): medications, laying down. Vitals:  height is _0  (1.803 m) and weight is 161 lb (73 kg). His temporal temperature is 98.4 F (36.9 C). His blood pressure is 123/94 (abnormal) and his pulse is 99. His respiration is 16 and oxygen saturation is 98%.   No change in medical history since last visit.  Patient's pain is at baseline.  Patient  continues multimodal pain regimen as prescribed.  States that it provides pain relief and improvement in functional status.   Pharmacotherapy Assessment   03/19/2020  1   12/06/2019  Tramadol Hcl 50 MG Tablet  90.00  30 Bi Lat   [id number]   Thr (4878)   3  15.00 MME  Comm Ins   [place]  03/14/2020  1   12/06/2019  Belbuca 450 MCG Film  60.00  30 Bi Lat   [id number]   Thr (4878)   3  0.90 mg  Comm Ins   [place]        Monitoring: [place] PMP: PDMP reviewed during this encounter.       Pharmacotherapy: No side-effects or adverse reactions reported. Compliance: No problems identified. Effectiveness: Clinically acceptable.  UDS:  Summary  Date Value Ref Range Status  08/09/2019 Note  Final    Comment:    ==================================================================== [employer] Select 13 (MW) ==================================================================== Test                             Result       Flag       Units Drug Present and Declared for Prescription Verification   Buprenorphine                  20           EXPECTED   ng/mg creat   Norbuprenorphine               100          EXPECTED   ng/mg creat    Source of buprenorphine is a scheduled prescription medication.    Norbuprenorphine is an expected metabolite  of buprenorphine.   Tramadol                       >1667        EXPECTED   ng/mg creat   O-Desmethyltramadol            >1667        EXPECTED   ng/mg creat   N-Desmethyltramadol            771          EXPECTED   ng/mg creat    Source of tramadol is a prescription medication. O-desmethyltramadol    and N-desmethyltramadol are expected metabolites of tramadol. ==================================================================== Test                      Result    Flag   Units      Ref Range   Creatinine              300              mg/dL      >=20 ==================================================================== Declared Medications:  The flagging and interpretation on this  report are based on the  following declared medications.  Unexpected results may arise from  inaccuracies in the declared medications.  **Note: The testing scope of this panel includes these medications:  Buprenorphine (Belbuca)  Tramadol (Ultram)  **Note: The testing scope of this panel does not include the  following reported medications:  Amlodipine  Aspirin  Metoprolol (Lopressor)  Multivitamin  Rosuvastatin (Crestor)  Testosterone  Zolpidem (Ambien) ==================================================================== For clinical consultation, please call [phone]. ====================================================================       ROS  Constitutional: Denies any fever or chills Gastrointestinal: No reported hemesis, hematochezia, vomiting, or acute GI distress Musculoskeletal: Denies any acute onset joint swelling, redness, loss of ROM, or weakness Neurological: No reported episodes of acute onset apraxia, aphasia, dysarthria, agnosia, amnesia, paralysis, loss of coordination, or loss of consciousness  Medication Review  Buprenorphine HCl, Multiple Vitamin, amLODipine, aspirin EC, clomiPHENE, rosuvastatin, traMADol, and zolpidem  History Review  Allergy: Mr. Lobue is allergic to penicillins. Drug: Mr. Ybarbo  reports no history of drug use. Alcohol:  reports current alcohol use. Tobacco:  reports that he has never smoked. He has never used smokeless tobacco. Social: Mr. Schreier  reports that he has never smoked. He has never used smokeless tobacco. He reports current alcohol use. He reports that he does not use drugs. Medical:  has a past medical history of cardiac catheterization, Hyperlipidemia, and Hypertension. Surgical: Mr. Ayoub  has a past surgical history that includes Coronary artery bypass graft; Hernia repair; Appendectomy; Cardiac catheterization; Colonoscopy with propofol (N/A, 06/24/2018); Esophagogastroduodenoscopy (egd) with propofol (N/A, 06/24/2018);  and Exam under anesthesia with hemorrhoidectomy (N/A, 11/11/2018). Family: family history includes Colon cancer in his sister; Diabetes in his father; Hyperlipidemia in his mother; Hypertension in his father; Thyroid cancer in his sister.  Laboratory Chemistry Profile   Renal Lab Results  Component Value Date   BUN 13 04/15/2019   CREATININE 0.98 04/15/2019   BCR 13 04/15/2019   GFRAA 98 04/15/2019   GFRNONAA 85 04/15/2019     Hepatic Lab Results  Component Value Date   AST 22 04/15/2019   ALT 22 04/15/2019   ALBUMIN 4.4 04/15/2019   ALKPHOS 72 04/15/2019     Electrolytes Lab Results  Component Value Date   NA 141 04/15/2019   K 4.9 04/15/2019   CL 99 04/15/2019  CALCIUM 9.6 04/15/2019     Bone Lab Results  Component Value Date   TESTOFREE 5.1 (L) 11/09/2019   TESTOSTERONE 322 11/09/2019     Inflammation (CRP: Acute Phase) (ESR: Chronic Phase) Lab Results  Component Value Date   ESRSEDRATE 2 08/06/2018       Note: Above Lab results reviewed.  Recent Imaging Review  CT Abdomen Pelvis W Contrast CLINICAL DATA:  59 year old male with a history of unexplained weight loss  EXAM: CT CHEST, ABDOMEN, AND PELVIS WITH CONTRAST  TECHNIQUE: Multidetector CT imaging of the chest, abdomen and pelvis was performed following the standard protocol during bolus administration of intravenous contrast.  CONTRAST:  112m OMNIPAQUE IOHEXOL 300 MG/ML  SOLN  COMPARISON:  None.  FINDINGS: CT CHEST FINDINGS  Cardiovascular:  Heart:  Surgical changes of median sternotomy and CABG. Native coronary calcifications  Aorta:  Unremarkable course, caliber, contour of the thoracic aorta. No aneurysm or dissection flap. No periaortic fluid. Calcifications of the aortic arch. Branch vessels are patent. Descending thoracic aorta with mild atherosclerotic changes.  Pulmonary arteries:  Study is not tailored for the most sensitive evaluation of the pulmonary arteries,  however, no central filling defects identified.  Mediastinum/Nodes: Small lymph nodes of the mediastinum. Calcified lymph nodes in the subcarinal and left hilar nodal stations. Unremarkable appearance of the thoracic esophagus.  Unremarkable appearance of the thoracic inlet.  Lungs/Pleura: Central airways are clear. No pleural effusion. No confluent airspace disease.  No pneumothorax.  Musculoskeletal: No acute displaced fracture. Degenerative changes of the spine.  Review of the MIP images confirms the above findings.  CT ABDOMEN PELVIS FINDINGS  Hepatobiliary: Unremarkable appearance of liver. Unremarkable gallbladder  Pancreas: Unremarkable pancreas  Spleen: Punctate calcifications throughout the spleen, compatible with prior granulomatous disease.  Adrenals/Urinary Tract: Unremarkable appearance of the adrenal glands. No evidence of hydronephrosis of the right or left kidney. No nephrolithiasis. Unremarkable course of the bilateral ureters. Unremarkable appearance of the urinary bladder.  Stomach/Bowel: Unremarkable stomach. Unremarkable small bowel. Appendectomy. Moderate stool burden. Colonic diverticular disease without evidence of acute inflammation.  Vascular/Lymphatic: Atherosclerotic changes of the abdominal aorta. Mesenteric arteries and bilateral renal arteries are patent. Bilateral iliac arteries and proximal femoral arteries patent.  Reproductive: Diameter of the prostate measures 4.4 cm  Other: None  Musculoskeletal: No acute displaced fracture.  Degenerative changes.  IMPRESSION: No acute CT finding.  No finding to account for weight loss.  Surgical changes of median sternotomy and CABG.  Aortic Atherosclerosis (ICD10-I70.0).  Diverticular disease without evidence of acute diverticulitis.  Electronically Signed   By: JCorrie MckusickD.O.   On: 05/06/2018 09:57 CT Chest W Contrast CLINICAL DATA:  59year old male with a history of  unexplained weight loss  EXAM: CT CHEST, ABDOMEN, AND PELVIS WITH CONTRAST  TECHNIQUE: Multidetector CT imaging of the chest, abdomen and pelvis was performed following the standard protocol during bolus administration of intravenous contrast.  CONTRAST:  1061mOMNIPAQUE IOHEXOL 300 MG/ML  SOLN  COMPARISON:  None.  FINDINGS: CT CHEST FINDINGS  Cardiovascular:  Heart:  Surgical changes of median sternotomy and CABG. Native coronary calcifications  Aorta:  Unremarkable course, caliber, contour of the thoracic aorta. No aneurysm or dissection flap. No periaortic fluid. Calcifications of the aortic arch. Branch vessels are patent. Descending thoracic aorta with mild atherosclerotic changes.  Pulmonary arteries:  Study is not tailored for the most sensitive evaluation of the pulmonary arteries, however, no central filling defects identified.  Mediastinum/Nodes: Small lymph nodes of the mediastinum. Calcified lymph  nodes in the subcarinal and left hilar nodal stations. Unremarkable appearance of the thoracic esophagus.  Unremarkable appearance of the thoracic inlet.  Lungs/Pleura: Central airways are clear. No pleural effusion. No confluent airspace disease.  No pneumothorax.  Musculoskeletal: No acute displaced fracture. Degenerative changes of the spine.  Review of the MIP images confirms the above findings.  CT ABDOMEN PELVIS FINDINGS  Hepatobiliary: Unremarkable appearance of liver. Unremarkable gallbladder  Pancreas: Unremarkable pancreas  Spleen: Punctate calcifications throughout the spleen, compatible with prior granulomatous disease.  Adrenals/Urinary Tract: Unremarkable appearance of the adrenal glands. No evidence of hydronephrosis of the right or left kidney. No nephrolithiasis. Unremarkable course of the bilateral ureters. Unremarkable appearance of the urinary bladder.  Stomach/Bowel: Unremarkable stomach. Unremarkable small  bowel. Appendectomy. Moderate stool burden. Colonic diverticular disease without evidence of acute inflammation.  Vascular/Lymphatic: Atherosclerotic changes of the abdominal aorta. Mesenteric arteries and bilateral renal arteries are patent. Bilateral iliac arteries and proximal femoral arteries patent.  Reproductive: Diameter of the prostate measures 4.4 cm  Other: None  Musculoskeletal: No acute displaced fracture.  Degenerative changes.  IMPRESSION: No acute CT finding.  No finding to account for weight loss.  Surgical changes of median sternotomy and CABG.  Aortic Atherosclerosis (ICD10-I70.0).  Diverticular disease without evidence of acute diverticulitis.  Electronically Signed   By: Corrie Mckusick D.O.   On: 05/06/2018 09:57 Note: Reviewed        Physical Exam  General appearance: Well nourished, well developed, and well hydrated. In no apparent acute distress Mental status: Alert, oriented x 3 (person, place, & time)       Respiratory: No evidence of acute respiratory distress Eyes: PERLA Vitals: BP (!) 123/94 (BP Location: Left Arm, Patient Position: Sitting, Cuff Size: Normal)   Pulse 99   Temp 98.4 F (36.9 C) (Temporal)   Resp 16   Ht _0  (1.803 m)   Wt 161 lb (73 kg)   SpO2 98%   BMI 22.45 kg/m  BMI: Estimated body mass index is 22.45 kg/m as calculated from the following:   Height as of this encounter: _1  (1.803 m).   Weight as of this encounter: 161 lb (73 kg). Ideal: Ideal body weight: 75.3 kg (166 lb 0.1 oz)  Lumbar Spine Area Exam  Skin & Axial Inspection: No masses, redness, or swelling Alignment: Symmetrical Functional ROM: Unrestricted ROM       Stability: No instability detected Muscle Tone/Strength: Functionally intact. No obvious neuro-muscular anomalies detected. Sensory (Neurological): Musculoskeletal pain pattern Palpation: No palpable anomalies        Gait & Posture Assessment  Ambulation: Unassisted Gait: Relatively  normal for age and body habitus Posture: WNL  Lower Extremity Exam    Side: Right lower extremity  Side: Left lower extremity  Stability: No instability observed          Stability: No instability observed          Skin & Extremity Inspection: Skin color, temperature, and hair growth are WNL. No peripheral edema or cyanosis. No masses, redness, swelling, asymmetry, or associated skin lesions. No contractures.  Skin & Extremity Inspection: Skin color, temperature, and hair growth are WNL. No peripheral edema or cyanosis. No masses, redness, swelling, asymmetry, or associated skin lesions. No contractures.  Functional ROM: Unrestricted ROM                  Functional ROM: Unrestricted ROM  Muscle Tone/Strength: Functionally intact. No obvious neuro-muscular anomalies detected.  Muscle Tone/Strength: Functionally intact. No obvious neuro-muscular anomalies detected.  Sensory (Neurological): Unimpaired        Sensory (Neurological): Unimpaired        DTR: Patellar: deferred today Achilles: deferred today Plantar: deferred today  DTR: Patellar: deferred today Achilles: deferred today Plantar: deferred today  Palpation: No palpable anomalies  Palpation: No palpable anomalies    Assessment   Status Diagnosis  Controlled Controlled Controlled 1. Lumbar radiculopathy   2. Chronic pain syndrome   3. Lumbar facet arthropathy   4. Piriformis muscle pain   5. Lumbar degenerative disc disease      Updated Problems: Problem  Lumbar Degenerative Disc Disease  Piriformis Muscle Pain  Chronic Pain Syndrome  Chronic Midline Low Back Pain Without Sciatica  Lumbar Facet Arthropathy  Arteriosclerosis of Coronary Artery   Overview:  Sp cabg with lima to lad right radial to pdz and svg to D1 All risk factors treated.   Lumbar Radiculopathy  Benign Essential Htn    Plan of Care  Mr. Elisha Cooksey has a current medication list which includes the following long-term  medication(s): amlodipine, rosuvastatin, and zolpidem.  Pharmacotherapy (Medications Ordered): Meds ordered this encounter  Medications  . Buprenorphine HCl (BELBUCA) 450 MCG FILM    Sig: Place 1 Film (450 mcg total) inside cheek 2 (two) times daily. For chronic pain syndrome    Dispense:  60 each    Refill:  3  . traMADol (ULTRAM) 50 MG tablet    Sig: Take 1 tablet (50 mg total) by mouth every 8 (eight) hours as needed for severe pain. Month last 30 days.    Dispense:  90 tablet    Refill:  3    [place] STOP ACT - Not applicable. Fill one day early if pharmacy is closed on scheduled refill date. .   Orders:  No orders of the defined types were placed in this encounter.  Follow-up plan:   Return in about 4 months (around 07/27/2020) for Medication Management, in person.   Recent Visits No visits were found meeting these conditions. Showing recent visits within past 90 days and meeting all other requirements Today's Visits Date Type Provider Dept  03/27/20 Office Visit Gillis Santa, MD [facility]  Showing today's visits and meeting all other requirements Future Appointments No visits were found meeting these conditions. Showing future appointments within next 90 days and meeting all other requirements  I discussed the assessment and treatment plan with the patient. The patient was provided an opportunity to ask questions and all were answered. The patient agreed with the plan and demonstrated an understanding of the instructions.  Patient advised to call back or seek an in-person evaluation if the symptoms or condition worsens.  Duration of encounter:30 minutes.  Note by: Gillis Santa, MD Date: 03/27/2020; Time: 8:44 AM

## 2020-04-03 ENCOUNTER — Encounter: Payer: 59 | Admitting: Student in an Organized Health Care Education/Training Program

## 2020-04-13 NOTE — Progress Notes (Signed)
Trena Platt Cummings,acting as a scribe for [name], MD.,have documented all relevant documentation on the behalf of [name], MD,as directed by  [name], MD while in the presence of [name], MD.  Complete physical exam   Patient: Shawn Crawford   DOB: Nov 21, 1960   59 y.o. Male  MRN: [id number] Visit Date: 04/17/2020  Today's healthcare provider: [name], MD   Chief Complaint  Patient presents with  . Annual Exam   Subjective    Shawn Crawford is a 59 y.o. male who presents today for a complete physical exam.  He reports consuming a general diet. Home exercise routine includes walking. He generally feels fairly well. He reports sleeping fairly well. He does have additional problems to discuss today.   HPI  Overall he feels better and has less pain since starting on Clomid. Patient complaining of having dizzy spells, says that he has mentioned this to Dr. Rosanna Randy before.  He has not had any recent problems with dizziness.  Past Medical History:  Diagnosis Date  . Hx of cardiac catheterization   . Hyperlipidemia   . Hypertension    Past Surgical History:  Procedure Laterality Date  . APPENDECTOMY    . CARDIAC CATHETERIZATION    . COLONOSCOPY WITH PROPOFOL N/A 06/24/2018   Procedure: COLONOSCOPY WITH PROPOFOL;  Surgeon: Virgel Manifold, MD;  Location: [facility] ENDOSCOPY;  Service: Endoscopy;  Laterality: N/A;  . CORONARY ARTERY BYPASS GRAFT     x 3  . ESOPHAGOGASTRODUODENOSCOPY (EGD) WITH PROPOFOL N/A 06/24/2018   Procedure: ESOPHAGOGASTRODUODENOSCOPY (EGD) WITH PROPOFOL;  Surgeon: Virgel Manifold, MD;  Location: [facility] ENDOSCOPY;  Service: Endoscopy;  Laterality: N/A;  . EVALUATION UNDER ANESTHESIA WITH HEMORRHOIDECTOMY N/A 11/11/2018   Procedure: HEMORRHOIDAL LIGATION/PEXY, PARTIAL LATERAL SPINCTEROTOMY;  Surgeon: Michael Boston, MD;  Location: [facility];  Service: General;  Laterality: N/A;  . HERNIA REPAIR      Social History   Socioeconomic History  . Marital status: Single    Spouse name: Not on file  . Number of children: Not on file  . Years of education: Not on file  . Highest education level: Not on file  Occupational History  . Not on file  Tobacco Use  . Smoking status: Never Smoker  . Smokeless tobacco: Never Used  Vaping Use  . Vaping Use: Never used  Substance and Sexual Activity  . Alcohol use: Yes    Alcohol/week: 0.0 standard drinks    Comment: glass of wine occasionally  . Drug use: No  . Sexual activity: Not on file  Other Topics Concern  . Not on file  Social History Narrative  . Not on file   Social Determinants of Health   Financial Resource Strain:   . Difficulty of Paying Living Expenses:   Food Insecurity:   . Worried About Charity fundraiser in the Last Year:   . Arboriculturist in the Last Year:   Transportation Needs:   . Film/video editor (Medical):   Marland Kitchen Lack of Transportation (Non-Medical):   Physical Activity:   . Days of Exercise per Week:   . Minutes of Exercise per Session:   Stress:   . Feeling of Stress :   Social Connections:   . Frequency of Communication with Friends and Family:   . Frequency of Social Gatherings with Friends and Family:   . Attends Religious Services:   . Active Member of Clubs or Organizations:   .  Attends Archivist Meetings:   Marland Kitchen Marital Status:   Intimate Partner Violence:   . Fear of Current or Ex-Partner:   . Emotionally Abused:   Marland Kitchen Physically Abused:   . Sexually Abused:    Family Status  Relation Name Status  . Mother  Alive  . Father  Deceased  . Sister  Alive  . Son  Alive  . Sister  Alive  . Son  Alive   Family History  Problem Relation Age of Onset  . Hyperlipidemia Mother   . Diabetes Father   . Hypertension Father   . Thyroid cancer Sister   . Colon cancer Sister    Allergies  Allergen Reactions  . Penicillins Itching and Other (See Comments)    Did it involve  swelling of the face/tongue/throat, SOB, or low BP? No Did it involve sudden or severe rash/hives, skin peeling, or any reaction on the inside of your mouth or nose? No Did you need to seek medical attention at a hospital or doctor's office? Yes When did it last happen? Childhood allergy If all above answers are "NO", may proceed with cephalosporin use.     Patient Care Team: Jerrol Banana., MD as PCP - General (Family Medicine) Michael Boston, MD as Consulting Physician (General Surgery) Corey Skains, MD as Consulting Physician (Cardiology) Virgel Manifold, MD as Consulting Physician (Gastroenterology)   Medications: Outpatient Medications Prior to Visit  Medication Sig  . amLODipine (NORVASC) 5 MG tablet TAKE 1 TABLET BY MOUTH  DAILY  . aspirin EC 81 MG tablet Take 81 mg by mouth daily.  . Buprenorphine HCl (BELBUCA) 450 MCG FILM Place 1 Film (450 mcg total) inside cheek 2 (two) times daily. For chronic pain syndrome  . clomiPHENE (CLOMID) 50 MG tablet Take 0.5 tablets (25 mg total) by mouth daily.  . MULTIPLE VITAMINS PO Take 1 tablet by mouth daily.   . rosuvastatin (CRESTOR) 40 MG tablet TAKE 1 TABLET BY MOUTH  DAILY  . traMADol (ULTRAM) 50 MG tablet Take 1 tablet (50 mg total) by mouth every 8 (eight) hours as needed for severe pain. Month last 30 days.  Marland Kitchen zolpidem (AMBIEN CR) 12.5 MG CR tablet TAKE ONE TABLET BY MOUTH AT BEDTIME AS NEEDED FOR SLEEP. (STOP ZOLPIDEM 10 MG)   No facility-administered medications prior to visit.    Review of Systems  Constitutional: Negative.   HENT: Negative.   Eyes: Negative.   Respiratory: Negative.   Cardiovascular: Negative.   Gastrointestinal: Negative.   Endocrine: Negative.   Genitourinary: Negative.   Musculoskeletal: Positive for myalgias.  Skin: Negative.   Allergic/Immunologic: Negative.   Neurological: Positive for dizziness.  Hematological: Negative.   Psychiatric/Behavioral: Negative.        Objective     BP 115/72 (BP Location: Right Arm, Patient Position: Sitting, Cuff Size: Normal)   Pulse 81   Temp (!) 96.9 F (36.1 C) (Temporal)   Ht 5\' 11"  (1.803 m)   Wt 166 lb 12.8 oz (75.7 kg)   BMI 23.26 kg/m  Wt Readings from Last 3 Encounters:  04/17/20 166 lb 12.8 oz (75.7 kg)  03/27/20 161 lb (73 kg)  01/24/20 165 lb 6.4 oz (75 kg)      Physical Exam Vitals reviewed.  Constitutional:      Appearance: Normal appearance.  HENT:     Head: Normocephalic and atraumatic.     Right Ear: Tympanic membrane normal.     Left Ear: Tympanic membrane normal.  Mouth/Throat:     Mouth: Mucous membranes are moist.     Pharynx: Oropharynx is clear.  Eyes:     General: No scleral icterus.    Conjunctiva/sclera: Conjunctivae normal.  Neck:     Vascular: No carotid bruit.  Cardiovascular:     Rate and Rhythm: Normal rate and regular rhythm.     Pulses: Normal pulses.     Heart sounds: Normal heart sounds.  Pulmonary:     Effort: Pulmonary effort is normal.     Breath sounds: Normal breath sounds.  Abdominal:     General: Bowel sounds are normal.  Genitourinary:    Penis: Normal.      Testes: Normal.  Musculoskeletal:     Cervical back: Normal range of motion.  Skin:    General: Skin is warm and dry.  Neurological:     Mental Status: He is alert and oriented to person, place, and time. Mental status is at baseline.  Psychiatric:        Mood and Affect: Mood normal.        Behavior: Behavior normal.        Thought Content: Thought content normal.        Judgment: Judgment normal.       Last depression screening scores PHQ 2/9 Scores 04/17/2020 03/27/2020 04/13/2019  PHQ - 2 Score 1 0 0  PHQ- 9 Score 3 - 0  Exception Documentation - Patient refusal -   Last fall risk screening Fall Risk  04/17/2020  Falls in the past year? 0  Number falls in past yr: 0  Injury with Fall? 0  Risk for fall due to : -  Follow up Falls evaluation completed   Last Audit-C alcohol use  screening Alcohol Use Disorder Test (AUDIT) 04/17/2020  1. How often do you have a drink containing alcohol? 0  2. How many drinks containing alcohol do you have on a typical day when you are drinking? 0  3. How often do you have six or more drinks on one occasion? 0  AUDIT-C Score 0   A score of 3 or more in women, and 4 or more in men indicates increased risk for alcohol abuse, EXCEPT if all of the points are from question 1   Results for orders placed or performed in visit on 04/17/20  POCT urinalysis dipstick  Result Value Ref Range   Color, UA     Clarity, UA     Glucose, UA Negative Negative   Bilirubin, UA negative    Ketones, UA negative    Spec Grav, UA 1.015 1.010 - 1.025   Blood, UA negative    pH, UA 6.0 5.0 - 8.0   Protein, UA Negative Negative   Urobilinogen, UA 0.2 0.2 or 1.0 E.U./dL   Nitrite, UA negative    Leukocytes, UA Negative Negative   Appearance     Odor      Assessment & Plan    Routine Health Maintenance and Physical Exam  Exercise Activities and Dietary recommendations Goals   None     Immunization History  Administered Date(s) Administered  . Influenza,inj,Quad PF,6+ Mos 05/30/2015, 06/16/2016, 05/27/2017, 08/04/2018, 06/22/2019  . Pneumococcal Conjugate-13 08/21/2015  . Td 04/13/2019  . Tdap 12/04/2008    Health Maintenance  Topic Date Due  . PNEUMOCOCCAL POLYSACCHARIDE VACCINE AGE 33-64 HIGH RISK  Never done  . FOOT EXAM  Never done  . OPHTHALMOLOGY EXAM  Never done  . URINE MICROALBUMIN  Never done  .  COVID-19 Vaccine (1) Never done  . HIV Screening  Never done  . INFLUENZA VACCINE  04/29/2020  . HEMOGLOBIN A1C  05/08/2020  . COLONOSCOPY  06/25/2023  . TETANUS/TDAP  04/12/2029  . Hepatitis C Screening  Completed    Discussed health benefits of physical activity, and encouraged him to engage in regular exercise appropriate for his age and condition. 1. Annual physical exam DRE next year.  2. Benign essential  HTN Controlled - TSH - Lipid panel - CBC with Differential/Platelet - Comprehensive metabolic panel - POCT urinalysis dipstick  3. Hyperlipidemia, unspecified hyperlipidemia type On statin. - TSH - Lipid panel - CBC with Differential/Platelet - Comprehensive metabolic panel - POCT urinalysis dipstick  4. Hyperglycemia  - TSH - Lipid panel - CBC with Differential/Platelet - Comprehensive metabolic panel - POCT urinalysis dipstick  5. Screening for prostate cancer  - PSA  6. Hypogonadism in male On Clomid.  Check level. - Testosterone  7. Arteriosclerosis of coronary artery All risk factors treated.  Patient status post CABG at age 9  8. Chronic pain syndrome Followed by pain clinic.   Return in 4-6 months Return in about 6 months (around 10/18/2020).     I, [name], MD, have reviewed all documentation for this visit. The documentation on 04/17/20 for the exam, diagnosis, procedures, and orders are all accurate and complete.    Tahtiana Rozier Cranford Mon, MD  [facility] [phone] (phone) [phone] (fax)  [place]

## 2020-04-17 ENCOUNTER — Other Ambulatory Visit: Payer: Self-pay

## 2020-04-17 ENCOUNTER — Encounter: Payer: Self-pay | Admitting: Family Medicine

## 2020-04-17 ENCOUNTER — Ambulatory Visit (INDEPENDENT_AMBULATORY_CARE_PROVIDER_SITE_OTHER): Payer: 59 | Admitting: Family Medicine

## 2020-04-17 VITALS — BP 115/72 | HR 81 | Temp 96.9°F | Ht 71.0 in | Wt 166.8 lb

## 2020-04-17 DIAGNOSIS — R739 Hyperglycemia, unspecified: Secondary | ICD-10-CM

## 2020-04-17 DIAGNOSIS — G894 Chronic pain syndrome: Secondary | ICD-10-CM

## 2020-04-17 DIAGNOSIS — I10 Essential (primary) hypertension: Secondary | ICD-10-CM | POA: Diagnosis not present

## 2020-04-17 DIAGNOSIS — I2510 Atherosclerotic heart disease of native coronary artery without angina pectoris: Secondary | ICD-10-CM

## 2020-04-17 DIAGNOSIS — Z125 Encounter for screening for malignant neoplasm of prostate: Secondary | ICD-10-CM

## 2020-04-17 DIAGNOSIS — E291 Testicular hypofunction: Secondary | ICD-10-CM

## 2020-04-17 DIAGNOSIS — E785 Hyperlipidemia, unspecified: Secondary | ICD-10-CM

## 2020-04-17 DIAGNOSIS — Z0000 Encounter for general adult medical examination without abnormal findings: Secondary | ICD-10-CM | POA: Diagnosis not present

## 2020-04-17 LAB — POCT URINALYSIS DIPSTICK
Bilirubin, UA: NEGATIVE
Blood, UA: NEGATIVE
Glucose, UA: NEGATIVE
Ketones, UA: NEGATIVE
Leukocytes, UA: NEGATIVE
Nitrite, UA: NEGATIVE
Protein, UA: NEGATIVE
Spec Grav, UA: 1.015 (ref 1.010–1.025)
Urobilinogen, UA: 0.2 E.U./dL
pH, UA: 6 (ref 5.0–8.0)

## 2020-04-19 ENCOUNTER — Telehealth: Payer: Self-pay

## 2020-04-19 LAB — CBC WITH DIFFERENTIAL/PLATELET
Basophils Absolute: 0 10*3/uL (ref 0.0–0.2)
Basos: 1 %
EOS (ABSOLUTE): 0.1 10*3/uL (ref 0.0–0.4)
Eos: 1 %
Hematocrit: 42 % (ref 37.5–51.0)
Hemoglobin: 14.4 g/dL (ref 13.0–17.7)
Immature Grans (Abs): 0 10*3/uL (ref 0.0–0.1)
Immature Granulocytes: 0 %
Lymphocytes Absolute: 0.9 10*3/uL (ref 0.7–3.1)
Lymphs: 14 %
MCH: 29.8 pg (ref 26.6–33.0)
MCHC: 34.3 g/dL (ref 31.5–35.7)
MCV: 87 fL (ref 79–97)
Monocytes Absolute: 0.5 10*3/uL (ref 0.1–0.9)
Monocytes: 7 %
Neutrophils Absolute: 4.9 10*3/uL (ref 1.4–7.0)
Neutrophils: 77 %
Platelets: 173 10*3/uL (ref 150–450)
RBC: 4.83 x10E6/uL (ref 4.14–5.80)
RDW: 11.8 % (ref 11.6–15.4)
WBC: 6.4 10*3/uL (ref 3.4–10.8)

## 2020-04-19 LAB — TESTOSTERONE: Testosterone: 328 ng/dL (ref 264–916)

## 2020-04-19 LAB — COMPREHENSIVE METABOLIC PANEL
ALT: 16 IU/L (ref 0–44)
AST: 23 IU/L (ref 0–40)
Albumin/Globulin Ratio: 2.3 — ABNORMAL HIGH (ref 1.2–2.2)
Albumin: 4.8 g/dL (ref 3.8–4.9)
Alkaline Phosphatase: 77 IU/L (ref 48–121)
BUN/Creatinine Ratio: 13 (ref 9–20)
BUN: 13 mg/dL (ref 6–24)
Bilirubin Total: 0.2 mg/dL (ref 0.0–1.2)
CO2: 24 mmol/L (ref 20–29)
Calcium: 9.6 mg/dL (ref 8.7–10.2)
Chloride: 100 mmol/L (ref 96–106)
Creatinine, Ser: 0.99 mg/dL (ref 0.76–1.27)
GFR calc Af Amer: 96 mL/min/{1.73_m2} (ref >59)
GFR calc non Af Amer: 83 mL/min/{1.73_m2} (ref >59)
Globulin, Total: 2.1 g/dL (ref 1.5–4.5)
Glucose: 127 mg/dL — ABNORMAL HIGH (ref 65–99)
Potassium: 5 mmol/L (ref 3.5–5.2)
Sodium: 139 mmol/L (ref 134–144)
Total Protein: 6.9 g/dL (ref 6.0–8.5)

## 2020-04-19 LAB — LIPID PANEL
Chol/HDL Ratio: 3.1 ratio (ref 0.0–5.0)
Cholesterol, Total: 139 mg/dL (ref 100–199)
HDL: 45 mg/dL (ref >39)
LDL Chol Calc (NIH): 83 mg/dL (ref 0–99)
Triglycerides: 49 mg/dL (ref 0–149)
VLDL Cholesterol Cal: 11 mg/dL (ref 5–40)

## 2020-04-19 LAB — TSH: TSH: 1.92 u[IU]/mL (ref 0.450–4.500)

## 2020-04-19 LAB — PSA: Prostate Specific Ag, Serum: 0.4 ng/mL (ref 0.0–4.0)

## 2020-04-19 NOTE — Telephone Encounter (Signed)
Patient advised of lab results through mychart.  °

## 2020-04-19 NOTE — Telephone Encounter (Signed)
-----   Message from Jerrol Banana., MD sent at 04/19/2020 12:59 PM EDT ----- Labs stable.

## 2020-04-23 ENCOUNTER — Encounter: Payer: Self-pay | Admitting: Family Medicine

## 2020-04-25 ENCOUNTER — Ambulatory Visit: Payer: Self-pay | Admitting: Family Medicine

## 2020-05-01 ENCOUNTER — Encounter: Payer: Self-pay | Admitting: Emergency Medicine

## 2020-05-01 ENCOUNTER — Other Ambulatory Visit: Payer: Self-pay

## 2020-05-01 DIAGNOSIS — Y939 Activity, unspecified: Secondary | ICD-10-CM | POA: Insufficient documentation

## 2020-05-01 DIAGNOSIS — Y929 Unspecified place or not applicable: Secondary | ICD-10-CM | POA: Diagnosis not present

## 2020-05-01 DIAGNOSIS — S66127A Laceration of flexor muscle, fascia and tendon of left little finger at wrist and hand level, initial encounter: Secondary | ICD-10-CM | POA: Diagnosis not present

## 2020-05-01 DIAGNOSIS — Z79899 Other long term (current) drug therapy: Secondary | ICD-10-CM | POA: Diagnosis not present

## 2020-05-01 DIAGNOSIS — W260XXA Contact with knife, initial encounter: Secondary | ICD-10-CM | POA: Insufficient documentation

## 2020-05-01 DIAGNOSIS — I10 Essential (primary) hypertension: Secondary | ICD-10-CM | POA: Diagnosis not present

## 2020-05-01 DIAGNOSIS — Y999 Unspecified external cause status: Secondary | ICD-10-CM | POA: Insufficient documentation

## 2020-05-01 NOTE — ED Triage Notes (Signed)
Pt presents to ED with laceration to 5th digit on his left hand. Pt states he cut it while cutting a peach. Pt states he cut the tendon and currently unable to move or have control over his finger.  Bleeding controlled.

## 2020-05-02 ENCOUNTER — Emergency Department
Admission: EM | Admit: 2020-05-02 | Discharge: 2020-05-02 | Disposition: A | Discharge status: Home / Self Care | Payer: 59 | Attending: Student in an Organized Health Care Education/Training Program | Admitting: Student in an Organized Health Care Education/Training Program

## 2020-05-02 ENCOUNTER — Other Ambulatory Visit: Payer: Self-pay

## 2020-05-02 DIAGNOSIS — S56129A Laceration of flexor muscle, fascia and tendon of unspecified finger at forearm level, initial encounter: Secondary | ICD-10-CM

## 2020-05-02 MED ORDER — CEPHALEXIN 500 MG PO CAPS: 500.0000 mg | ORAL_CAPSULE | Freq: Three times a day (TID) | ORAL | 0 refills | Status: DC

## 2020-05-02 MED ORDER — LIDOCAINE HCL (PF) 1 % IJ SOLN
10.0000 mL | Freq: Once | INTRAMUSCULAR | Status: AC
  Administered 2020-05-02: 10 mL
  Filled 2020-05-02: qty 10

## 2020-05-02 NOTE — Discharge Instructions (Addendum)
Call Dr. Rudene Christians to make an appointment for evaluation in the office and also tendon repair.  His contact information is listed on your discharge papers and he is in the orthopedic department at [facility].  Begin taking antibiotics as directed until completely finished.  Should you develop any itching with this medication discontinue it immediately and take some Benadryl.  This is a cephalosporin and even though it is not a penicillin there is a 10% chance of a crossover reaction.  Keep the area clean and dry.  Wear the splint for protection and support of your finger.  Clean the area daily with mild soap and water and allowed to dry before putting another dressing on it.

## 2020-05-02 NOTE — ED Provider Notes (Signed)
[facility] Emergency Department Provider Note   ____________________________________________   First MD Initiated Contact with Patient 05/02/20 [phone]     (approximate)  I have reviewed the triage vital signs and the nursing notes.   HISTORY  Chief Complaint Extremity Laceration   HPI Shawn Crawford is a 59 y.o. male presents to the ED with laceration to his left fifth finger.  Patient states that he was cutting a peach with a knife last night.  Patient was aware that he was unable to flex his finger just after cutting his finger.  He reports that his tetanus is up-to-date.  He rates pain as 0 out of 10.       Past Medical History:  Diagnosis Date  . Hx of cardiac catheterization   . Hyperlipidemia   . Hypertension     Patient Active Problem List   Diagnosis Date Noted  . Special screening for malignant neoplasms, colon   . Benign neoplasm of descending colon   . Diverticulosis of large intestine without diverticulitis   . Anal fissure s/p partial internal anal sphincterotomy 11/11/2018   . Melanosis of colon   . Internal hemorrhoids   . Lumbar degenerative disc disease 04/06/2018  . Piriformis muscle pain 04/06/2018  . Chronic pain syndrome 10/22/2017  . Chronic midline low back pain without sciatica 10/22/2017  . Lumbar facet arthropathy 10/22/2017  . Hyperglycemia 05/30/2015  . Abnormal blood sugar 05/10/2015  . Arteriosclerosis of coronary artery 05/10/2015  . ED (erectile dysfunction) of organic origin 05/10/2015  . Acid reflux 05/10/2015  . HLD (hyperlipidemia) 05/10/2015  . BP (high blood pressure) 05/10/2015  . Lumbar radiculopathy 05/10/2015  . Avitaminosis D 05/10/2015  . Benign essential HTN 05/07/2015    Past Surgical History:  Procedure Laterality Date  . APPENDECTOMY    . CARDIAC CATHETERIZATION    . COLONOSCOPY WITH PROPOFOL N/A 06/24/2018   Procedure: COLONOSCOPY WITH PROPOFOL;  Surgeon: Virgel Manifold, MD;   Location: [facility] ENDOSCOPY;  Service: Endoscopy;  Laterality: N/A;  . CORONARY ARTERY BYPASS GRAFT     x 3  . ESOPHAGOGASTRODUODENOSCOPY (EGD) WITH PROPOFOL N/A 06/24/2018   Procedure: ESOPHAGOGASTRODUODENOSCOPY (EGD) WITH PROPOFOL;  Surgeon: Virgel Manifold, MD;  Location: [facility] ENDOSCOPY;  Service: Endoscopy;  Laterality: N/A;  . EVALUATION UNDER ANESTHESIA WITH HEMORRHOIDECTOMY N/A 11/11/2018   Procedure: HEMORRHOIDAL LIGATION/PEXY, PARTIAL LATERAL SPINCTEROTOMY;  Surgeon: Michael Boston, MD;  Location: [facility];  Service: General;  Laterality: N/A;  . HERNIA REPAIR      Prior to Admission medications   Medication Sig Start Date End Date Taking? Authorizing Provider  amLODipine (NORVASC) 5 MG tablet TAKE 1 TABLET BY MOUTH  DAILY 02/28/20   Jerrol Banana., MD  aspirin EC 81 MG tablet Take 81 mg by mouth daily.    [provider]  Buprenorphine HCl (BELBUCA) 450 MCG FILM Place 1 Film (450 mcg total) inside cheek 2 (two) times daily. For chronic pain syndrome 04/12/20   Gillis Santa, MD  cephALEXin (KEFLEX) 500 MG capsule Take 1 capsule (500 mg total) by mouth 3 (three) times daily. 05/02/20   Johnn Hai, PA-C  clomiPHENE (CLOMID) 50 MG tablet Take 0.5 tablets (25 mg total) by mouth daily. 12/13/19   Jerrol Banana., MD  MULTIPLE VITAMINS PO Take 1 tablet by mouth daily.  10/22/11   [provider]  rosuvastatin (CRESTOR) 40 MG tablet TAKE 1 TABLET BY MOUTH  DAILY 10/11/19   Eulas Post  Brooke Bonito., MD  traMADol (ULTRAM) 50 MG tablet Take 1 tablet (50 mg total) by mouth every 8 (eight) hours as needed for severe pain. Month last 30 days. 04/17/20 08/15/20  Gillis Santa, MD  zolpidem (AMBIEN CR) 12.5 MG CR tablet TAKE ONE TABLET BY MOUTH AT BEDTIME AS NEEDED FOR SLEEP. (STOP ZOLPIDEM 10 MG) 11/29/19   Jerrol Banana., MD    Allergies Penicillins  Family History  Problem Relation Age of Onset  . Hyperlipidemia Mother   . Diabetes Father   . Hypertension  Father   . Thyroid cancer Sister   . Colon cancer Sister     Social History Social History   Tobacco Use  . Smoking status: Never Smoker  . Smokeless tobacco: Never Used  Vaping Use  . Vaping Use: Never used  Substance Use Topics  . Alcohol use: Yes    Alcohol/week: 0.0 standard drinks    Comment: glass of wine occasionally  . Drug use: No    Review of Systems Constitutional: No fever/chills Eyes: No visual changes. Cardiovascular: Denies chest pain. Respiratory: Denies shortness of breath. Gastrointestinal: No abdominal pain.  No nausea, no vomiting.  Musculoskeletal: Positive for left fifth finger injury. Skin: Positive for laceration. Neurological: Negative for headaches, focal weakness or numbness. ____________________________________________   PHYSICAL EXAM:  VITAL SIGNS: ED Triage Vitals  Enc Vitals Group     BP 05/01/20 2248 (!) 154/90     Pulse Rate 05/01/20 2248 86     Resp 05/01/20 2248 18     Temp 05/01/20 2248 99.4 F (37.4 C)     Temp Source 05/01/20 2248 Oral     SpO2 05/01/20 2248 100 %     Weight 05/01/20 2249 162 lb (73.5 kg)     Height 05/01/20 2249 5\' 11"  (1.803 m)     Head Circumference --      Peak Flow --      Pain Score 05/01/20 2248 0     Pain Loc --      Pain Edu? --      Excl. in [place]? --     Constitutional: Alert and oriented. Well appearing and in no acute distress. Eyes: Conjunctivae are normal.  Head: Atraumatic. Neck: No stridor.   Cardiovascular: Normal rate, regular rhythm. Grossly normal heart sounds.  Good peripheral circulation. Respiratory: Normal respiratory effort.  No retractions. Lungs CTAB. Musculoskeletal: Base of the left 5th digit volar aspect there is a 2 cm laceration without active bleeding.  Patient is unable to flex both the distal and middle phalanx.  Motor sensory function intact.  Capillary refills less than 3 seconds.  No foreign body is noted in the laceration. Neurologic:  Normal speech and language. No  gross focal neurologic deficits are appreciated. No gait instability. Skin:  Skin is warm, dry.  Laceration as noted above. Psychiatric: Mood and affect are normal. Speech and behavior are normal.  ____________________________________________   LABS (all labs ordered are listed, but only abnormal results are displayed)  Labs Reviewed - No data to display   PROCEDURES  Procedure(s) performed (including Critical Care):  Marland KitchenMarland KitchenLaceration Repair  Date/Time: 05/02/2020 7:58 AM Performed by: Johnn Hai, PA-C Authorized by: Johnn Hai, PA-C   Consent:    Consent obtained:  Verbal   Consent given by:  Patient   Risks discussed:  Poor wound healing and infection Anesthesia (see MAR for exact dosages):    Anesthesia method:  Nerve block   Block location:  Left  5th digit   Block needle gauge:  25 G   Block anesthetic:  Lidocaine 1% w/o epi   Block injection procedure:  Anatomic landmarks identified, introduced needle and incremental injection   Block outcome:  Anesthesia achieved Laceration details:    Location:  Finger   Finger location:  L small finger   Length (cm):  2 Repair type:    Repair type:  Simple Pre-procedure details:    Preparation:  Patient was prepped and draped in usual sterile fashion Exploration:    Hemostasis achieved with:  Direct pressure   Wound extent: tendon damage     Tendon damage location:  Upper extremity   Upper extremity tendon damage location:  Finger flexor   Tendon damage extent:  Complete transection   Tendon repair plan:  Refer for evaluation   Contaminated: no   Treatment:    Area cleansed with:  Saline   Amount of cleaning:  Extensive   Irrigation solution:  Sterile saline   Irrigation volume:  100 ml   Irrigation method:  Syringe   Visualized foreign bodies/material removed: no   Skin repair:    Repair method:  Sutures   Suture size:  5-0   Suture technique:  Simple interrupted   Number of sutures:  3 Approximation:     Approximation:  Close Post-procedure details:    Dressing:  Non-adherent dressing   Patient tolerance of procedure:  Tolerated well, no immediate complications Comments:     Suturing was done by Celene Kras, PA-S with supervision.       ____________________________________________   INITIAL IMPRESSION / ASSESSMENT AND PLAN / ED COURSE  As part of my medical decision making, I reviewed the following data within the electronic MEDICAL RECORD NUMBER Notes from prior ED visits and [place] Controlled Substance Database  Ruston Malikye Reppond was evaluated in Emergency Department on 05/02/2020 for the symptoms described in the history of present illness. He was evaluated in the context of the global COVID-19 pandemic, which necessitated consideration that the patient might be at risk for infection with the SARS-CoV-2 virus that causes COVID-19. Institutional protocols and algorithms that pertain to the evaluation of patients at risk for COVID-19 are in a state of rapid change based on information released by regulatory bodies including the CDC and federal and state organizations. These policies and algorithms were followed during the patient's care in the ED.  59 year old male is being seen in the emergency department for a laceration to his left 5th digit.  Patient has been unable to flex his finger since his accident.  He states he was cutting a peach with a knife and accidentally cut himself.  Patient is unable to flex both the distal and middle phalanx.  Motor sensory function and capillary refill within normal limits.  Wound has been open greater than 10 hours due to excessive weight in the emergency department.  Area was sutured and patient was placed on Keflex to avoid infection.  Dressing was applied and patient was placed in a finger splint for support and protection.  He is instructed to call [facility] orthopedic department and follow-up with Dr. Rudene Christians who is on-call today for tendon repair of his  5th digit.  Patient voices understanding.  He is to clean the area daily with mild soap and water and return to the emergency department if there is any signs of infection.  ____________________________________________   FINAL CLINICAL IMPRESSION(S) / ED DIAGNOSES  Final diagnoses:  Flexor tendon laceration, finger, open  wound, initial encounter     ED Discharge Orders         Ordered    cephALEXin (KEFLEX) 500 MG capsule  3 times daily     Discontinue  Reprint     05/02/20 0848           Note:  This document was prepared using Dragon voice recognition software and may include unintentional dictation errors.    Johnn Hai, PA-C 05/02/20 1519    Merlyn Lot, MD 05/02/20 [phone]

## 2020-05-02 NOTE — ED Notes (Signed)
See triage note  Presents with laceration to left 5th finger  States she was cutting a peach  The knife slipped  Bleeding controlled at present but having decreased flexion and extension

## 2020-05-24 ENCOUNTER — Other Ambulatory Visit: Payer: Self-pay | Admitting: Family Medicine

## 2020-05-24 NOTE — Telephone Encounter (Signed)
°  Requested  medications are  due for refill today yes  Requested medications are on the active medication list yes  Last refill 04/26/20  Last visit July 2021  Future visit scheduled Nov 2021  Notes to clinic Not Delegated

## 2020-05-25 NOTE — Telephone Encounter (Signed)
Please advise 

## 2020-06-13 ENCOUNTER — Encounter: Payer: Self-pay | Admitting: Dermatology

## 2020-06-13 ENCOUNTER — Other Ambulatory Visit: Payer: Self-pay

## 2020-06-13 ENCOUNTER — Ambulatory Visit (INDEPENDENT_AMBULATORY_CARE_PROVIDER_SITE_OTHER): Payer: 59 | Admitting: Dermatology

## 2020-06-13 DIAGNOSIS — L821 Other seborrheic keratosis: Secondary | ICD-10-CM

## 2020-06-13 DIAGNOSIS — Z1283 Encounter for screening for malignant neoplasm of skin: Secondary | ICD-10-CM | POA: Diagnosis not present

## 2020-06-13 DIAGNOSIS — D229 Melanocytic nevi, unspecified: Secondary | ICD-10-CM | POA: Diagnosis not present

## 2020-06-13 DIAGNOSIS — L570 Actinic keratosis: Secondary | ICD-10-CM

## 2020-06-13 DIAGNOSIS — D1800 Hemangioma unspecified site: Secondary | ICD-10-CM

## 2020-06-13 DIAGNOSIS — L578 Other skin changes due to chronic exposure to nonionizing radiation: Secondary | ICD-10-CM

## 2020-06-13 DIAGNOSIS — L219 Seborrheic dermatitis, unspecified: Secondary | ICD-10-CM

## 2020-06-13 MED ORDER — DESONIDE 0.05 % EX OINT: TOPICAL_OINTMENT | CUTANEOUS | 3 refills | Status: DC

## 2020-06-13 MED ORDER — KETOCONAZOLE 2 % EX CREA: TOPICAL_CREAM | CUTANEOUS | 3 refills | Status: DC

## 2020-06-13 NOTE — Progress Notes (Signed)
    Follow-Up Visit   Subjective  Shawn Crawford is a 59 y.o. male who presents for the following: Annual Exam (Total body skin exam, Hx of dysplastic nevus L lat epigastric, midline epigastric. Seb Derm R ear.) and spot check (spot on back of R ear pt wants checked). The patient presents for Total-Body Skin Exam (TBSE) for skin cancer screening and mole check.  The following portions of the chart were reviewed this encounter and updated as appropriate: Tobacco  Allergies  Meds  Problems  Med Hx  Surg Hx  Fam Hx     Review of Systems: No other skin or systemic complaints except as noted in HPI or Assessment and Plan.   Objective  Well appearing patient in no apparent distress; mood and affect are within normal limits.  A full examination was performed including scalp, head, eyes, ears, nose, lips, neck, chest, axillae, abdomen, back, buttocks, bilateral upper extremities, bilateral lower extremities, hands, feet, fingers, toes, fingernails, and toenails. All findings within normal limits unless otherwise noted below.  Objective  Right Ear: Dry and scaly  Objective  Right Ear superior helix x 1, Scalp x 10 (10):   Assessment & Plan  Seborrheic dermatitis  Ear Continue Desonide 0.05% ointment Mondays, Wednesdays, and Fridays. Continue Ketoconazole 2% cream Tuesdays, Thursdays, and Fridays.  desonide (DESOWEN) 0.05 % ointment - Right Ear  ketoconazole (NIZORAL) 2 % cream - Right Ear  Actinic keratosis (11) Right Ear superior helix x 1; Scalp x 10 (10) If ear lesion still present on fu, will plan biopsy  Destruction of lesion - Right Ear superior helix x 1, Scalp x 10 Complexity: simple   Destruction method: cryotherapy   Informed consent: discussed and consent obtained   Timeout:  patient name, date of birth, surgical site, and procedure verified Lesion destroyed using liquid nitrogen: Yes   Region frozen until ice ball extended beyond lesion: Yes   Outcome: patient  tolerated procedure well with no complications   Post-procedure details: wound care instructions given    Seborrheic Keratoses - Stuck-on, waxy, tan-brown papules and plaques  - Discussed benign etiology and prognosis. - Observe - Call for any changes  Melanocytic Nevi - Tan-brown and/or pink-flesh-colored symmetric macules and papules - Benign appearing on exam today - Observation - Call clinic for new or changing moles - Recommend daily use of broad spectrum spf 30+ sunscreen to sun-exposed areas.   Hemangiomas - Red papules - Discussed benign nature - Observe - Call for any changes  Actinic Damage - diffuse scaly erythematous macules with underlying dyspigmentation - Recommend daily broad spectrum sunscreen SPF 30+ to sun-exposed areas, reapply every 2 hours as needed.  - Call for new or changing lesions.  Skin cancer screening performed today.  Patient's left hand, wrist, and lower forearm covered with brace during visit due to injury. Will check area at follow up in approximately 2 months.  Return in about 2 months (around 08/13/2020) for recheck R ear, scalp, left arm.   IHarriett Sine, CMA, am acting as scribe for Sarina Ser, MD.  Documentation: I have reviewed the above documentation for accuracy and completeness, and I agree with the above.  Sarina Ser, MD

## 2020-06-26 ENCOUNTER — Encounter: Payer: Self-pay | Admitting: Psychiatry

## 2020-06-26 ENCOUNTER — Other Ambulatory Visit: Payer: Self-pay

## 2020-06-26 ENCOUNTER — Ambulatory Visit (INDEPENDENT_AMBULATORY_CARE_PROVIDER_SITE_OTHER): Payer: 59 | Admitting: Psychiatry

## 2020-06-26 DIAGNOSIS — F4321 Adjustment disorder with depressed mood: Secondary | ICD-10-CM
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
## 2020-07-05 ENCOUNTER — Encounter: Payer: Self-pay | Admitting: Family Medicine

## 2020-07-13 ENCOUNTER — Other Ambulatory Visit: Payer: Self-pay | Admitting: Student in an Organized Health Care Education/Training Program

## 2020-07-13 DIAGNOSIS — G894 Chronic pain syndrome: Secondary | ICD-10-CM

## 2020-07-19 ENCOUNTER — Ambulatory Visit: Payer: 59

## 2020-07-19 ENCOUNTER — Other Ambulatory Visit: Payer: Self-pay

## 2020-07-19 ENCOUNTER — Ambulatory Visit (INDEPENDENT_AMBULATORY_CARE_PROVIDER_SITE_OTHER): Payer: 59

## 2020-07-19 DIAGNOSIS — Z23 Encounter for immunization: Secondary | ICD-10-CM | POA: Diagnosis not present

## 2020-07-24 ENCOUNTER — Ambulatory Visit
Payer: 59 | Attending: Student in an Organized Health Care Education/Training Program | Admitting: Student in an Organized Health Care Education/Training Program

## 2020-07-24 ENCOUNTER — Other Ambulatory Visit: Payer: Self-pay

## 2020-07-24 ENCOUNTER — Encounter: Payer: Self-pay | Admitting: Student in an Organized Health Care Education/Training Program

## 2020-07-24 VITALS — BP 120/77 | HR 85 | Temp 96.8°F | Resp 16 | Ht 71.0 in | Wt 161.0 lb

## 2020-07-24 DIAGNOSIS — G894 Chronic pain syndrome: Secondary | ICD-10-CM

## 2020-07-24 DIAGNOSIS — M7918 Myalgia, other site: Secondary | ICD-10-CM | POA: Diagnosis not present

## 2020-07-24 DIAGNOSIS — M5416 Radiculopathy, lumbar region: Secondary | ICD-10-CM | POA: Diagnosis present

## 2020-07-24 DIAGNOSIS — M47816 Spondylosis without myelopathy or radiculopathy, lumbar region: Secondary | ICD-10-CM

## 2020-07-24 DIAGNOSIS — M5136 Other intervertebral disc degeneration, lumbar region: Secondary | ICD-10-CM | POA: Insufficient documentation

## 2020-07-24 MED ORDER — TRAMADOL HCL 50 MG PO TABS: 50.0000 mg | ORAL_TABLET | Freq: Three times a day (TID) | ORAL | 3 refills | Status: DC | PRN

## 2020-07-24 MED ORDER — BELBUCA 450 MCG BU FILM: 1.0000 | ORAL_FILM | Freq: Two times a day (BID) | BUCCAL | 3 refills | Status: DC

## 2020-07-24 NOTE — Progress Notes (Signed)
PROVIDER NOTE: Information contained herein reflects review and annotations entered in association with encounter. Interpretation of such information and data should be left to medically-trained personnel. Information provided to patient can be located elsewhere in the medical record under "Patient Instructions". Document created using STT-dictation technology, any transcriptional errors that may result from process are unintentional.    Patient: Shawn Crawford  Service Category: E/M  Provider: Gillis Santa, MD  DOB: December 19, 1960  DOS: 07/24/2020  Specialty: Interventional Pain Management  MRN: [id number]  Setting: Ambulatory outpatient  PCP: Shawn Banana., MD  Type: Established Patient    Referring Provider: Jerrol Banana.,*  Location: Office  Delivery: Face-to-face     HPI  [name], a 59 y.o. year old male, is here today because of his Lumbar radiculopathy [M54.16]. [name] primary complain today is Back Pain (lower) Last encounter: My last encounter with him was on 07/13/2020. Pertinent problems: [name] has Arteriosclerosis of coronary artery; Lumbar radiculopathy; Benign essential HTN; Chronic pain syndrome; Chronic midline low back pain without sciatica; Lumbar facet arthropathy; Lumbar degenerative disc disease; and Piriformis muscle pain on their pertinent problem list. Pain Assessment: Severity of Chronic pain is reported as a 2 /10. Location: Back Lower/both buttocks and back of both legs. Onset: More than a month ago. Quality: Aching. Timing: Intermittent. Modifying factor(s): lying on side with pillow between legs. Vitals:  height is $RemoveB'5\' 11"'PjjPNkIf$  (1.803 m) and weight is 161 lb (73 kg). His temporal temperature is 96.8 F (36 C) (abnormal). His blood pressure is 120/77 and his pulse is 85. His respiration is 16 and oxygen saturation is 100%.   Reason for encounter:   No change in medical history since last visit.  Patient's pain is at baseline.  Patient continues  multimodal pain regimen as prescribed.  States that it provides pain relief and improvement in functional status.   Pharmacotherapy Assessment   07/13/2020  03/27/2020   1  Tramadol Hcl 50 Mg Tablet 90.00  30  Bi Lat  [id number]  Thr (4878)  3/3  15.00 MME  Comm Ins  [place]    07/13/2020  03/27/2020   1  Belbuca 450 Mcg Film 60.00  30  Bi Lat  [id number]  Thr (4878)  3/3  0.90 mg  Comm Ins  [place]       Monitoring: Shawn Crawford PMP: PDMP reviewed during this encounter.       Pharmacotherapy: No side-effects or adverse reactions reported. Compliance: No problems identified. Effectiveness: Clinically acceptable.  Shawn Martins, RN  07/24/2020  8:16 AM  Sign when Signing Visit Nursing Pain Medication Assessment:  Safety precautions to be maintained throughout the outpatient stay will include: orient to surroundings, keep bed in low position, maintain call bell within reach at all times, provide assistance with transfer out of bed and ambulation.  Medication Inspection Compliance: Pill count conducted under aseptic conditions, in front of the patient. Neither the pills nor the bottle was removed from the patient's sight at any time. Once count was completed pills were immediately returned to the patient in their original bottle.  Medication #1: Belbuca Pill/Patch Count: 47 of 60 pills remain Pill/Patch Appearance: Markings consistent with prescribed medication Bottle Appearance: Standard pharmacy container. Clearly labeled. Filled Date: 07/13/2020 Last Medication intake:  Today  Medication #2: Tramadol (Ultram) Pill/Patch Count: 73 of 90 pills remain Pill/Patch Appearance: Markings consistent with prescribed medication Bottle Appearance: Standard pharmacy container. Clearly labeled. Filled Date: 06/14/2020 Last Medication intake:  Yesterday    UDS:  Summary  Date Value Ref Range Status  08/09/2019 Note  Final    Comment:     ==================================================================== ToxASSURE Select 13 (MW) ==================================================================== Test                             Result       Flag       Units Drug Present and Declared for Prescription Verification   Buprenorphine                  20           EXPECTED   ng/mg creat   Norbuprenorphine               100          EXPECTED   ng/mg creat    Source of buprenorphine is a scheduled prescription medication.    Norbuprenorphine is an expected metabolite of buprenorphine.   Tramadol                       >1667        EXPECTED   ng/mg creat   O-Desmethyltramadol            >1667        EXPECTED   ng/mg creat   N-Desmethyltramadol            771          EXPECTED   ng/mg creat    Source of tramadol is a prescription medication. O-desmethyltramadol    and N-desmethyltramadol are expected metabolites of tramadol. ==================================================================== Test                      Result    Flag   Units      Ref Range   Creatinine              300              mg/dL      >=20 ==================================================================== Declared Medications:  The flagging and interpretation on this report are based on the  following declared medications.  Unexpected results may arise from  inaccuracies in the declared medications.  **Note: The testing scope of this panel includes these medications:  Buprenorphine (Belbuca)  Tramadol (Ultram)  **Note: The testing scope of this panel does not include the  following reported medications:  Amlodipine  Aspirin  Metoprolol (Lopressor)  Multivitamin  Rosuvastatin (Crestor)  Testosterone  Zolpidem (Ambien) ==================================================================== For clinical consultation, please call [phone]. ====================================================================      ROS  Constitutional: Denies any  fever or chills Gastrointestinal: No reported hemesis, hematochezia, vomiting, or acute GI distress Musculoskeletal: Denies any acute onset joint swelling, redness, loss of ROM, or weakness Neurological: No reported episodes of acute onset apraxia, aphasia, dysarthria, agnosia, amnesia, paralysis, loss of coordination, or loss of consciousness  Medication Review  Buprenorphine HCl, Multiple Vitamin, amLODipine, aspirin EC, clomiPHENE, desonide, ketoconazole, rosuvastatin, traMADol, and zolpidem  History Review  Allergy: [name] is allergic to penicillins. Drug: [name]  reports no history of drug use. Alcohol:  reports current alcohol use. Tobacco:  reports that he has never smoked. He has never used smokeless tobacco. Social: [name]  reports that he has never smoked. He has never used smokeless tobacco. He reports current alcohol use. He reports that he does not use drugs. Medical:  has  a past medical history of Actinic keratosis, Dysplastic nevus (09/06/2014), Dysplastic nevus (07/09/2015), cardiac catheterization, Hyperlipidemia, and Hypertension. Surgical: [name]  has a past surgical history that includes Coronary artery bypass graft; Hernia repair; Appendectomy; Cardiac catheterization; Colonoscopy with propofol (N/A, 06/24/2018); Esophagogastroduodenoscopy (egd) with propofol (N/A, 06/24/2018); and Exam under anesthesia with hemorrhoidectomy (N/A, 11/11/2018). Family: family history includes Colon cancer in his sister; Diabetes in his father; Hyperlipidemia in his mother; Hypertension in his father; Thyroid cancer in his sister.  Laboratory Chemistry Profile   Renal Lab Results  Component Value Date   BUN 13 04/18/2020   CREATININE 0.99 04/18/2020   BCR 13 04/18/2020   GFRAA 96 04/18/2020   GFRNONAA 83 04/18/2020     Hepatic Lab Results  Component Value Date   AST 23 04/18/2020   ALT 16 04/18/2020   ALBUMIN 4.8 04/18/2020   ALKPHOS 77 04/18/2020     Electrolytes Lab  Results  Component Value Date   NA 139 04/18/2020   K 5.0 04/18/2020   CL 100 04/18/2020   CALCIUM 9.6 04/18/2020     Bone Lab Results  Component Value Date   TESTOFREE 5.1 (L) 11/09/2019   TESTOSTERONE 328 04/18/2020     Inflammation (CRP: Acute Phase) (ESR: Chronic Phase) Lab Results  Component Value Date   ESRSEDRATE 2 08/06/2018       Note: Above Lab results reviewed.  Recent Imaging Review  CT Abdomen Pelvis W Contrast CLINICAL DATA:  59 year old male with a history of unexplained weight loss  EXAM: CT CHEST, ABDOMEN, AND PELVIS WITH CONTRAST  TECHNIQUE: Multidetector CT imaging of the chest, abdomen and pelvis was performed following the standard protocol during bolus administration of intravenous contrast.  CONTRAST:  145mL OMNIPAQUE IOHEXOL 300 MG/ML  SOLN  COMPARISON:  None.  FINDINGS: CT CHEST FINDINGS  Cardiovascular:  Heart:  Surgical changes of median sternotomy and CABG. Native coronary calcifications  Aorta:  Unremarkable course, caliber, contour of the thoracic aorta. No aneurysm or dissection flap. No periaortic fluid. Calcifications of the aortic arch. Branch vessels are patent. Descending thoracic aorta with mild atherosclerotic changes.  Pulmonary arteries:  Study is not tailored for the most sensitive evaluation of the pulmonary arteries, however, no central filling defects identified.  Mediastinum/Nodes: Small lymph nodes of the mediastinum. Calcified lymph nodes in the subcarinal and left hilar nodal stations. Unremarkable appearance of the thoracic esophagus.  Unremarkable appearance of the thoracic inlet.  Lungs/Pleura: Central airways are clear. No pleural effusion. No confluent airspace disease.  No pneumothorax.  Musculoskeletal: No acute displaced fracture. Degenerative changes of the spine.  Review of the MIP images confirms the above findings.  CT ABDOMEN PELVIS FINDINGS  Hepatobiliary: Unremarkable  appearance of liver. Unremarkable gallbladder  Pancreas: Unremarkable pancreas  Spleen: Punctate calcifications throughout the spleen, compatible with prior granulomatous disease.  Adrenals/Urinary Tract: Unremarkable appearance of the adrenal glands. No evidence of hydronephrosis of the right or left kidney. No nephrolithiasis. Unremarkable course of the bilateral ureters. Unremarkable appearance of the urinary bladder.  Stomach/Bowel: Unremarkable stomach. Unremarkable small bowel. Appendectomy. Moderate stool burden. Colonic diverticular disease without evidence of acute inflammation.  Vascular/Lymphatic: Atherosclerotic changes of the abdominal aorta. Mesenteric arteries and bilateral renal arteries are patent. Bilateral iliac arteries and proximal femoral arteries patent.  Reproductive: Diameter of the prostate measures 4.4 cm  Other: None  Musculoskeletal: No acute displaced fracture.  Degenerative changes.  IMPRESSION: No acute CT finding.  No finding to account for weight loss.  Surgical changes of median  sternotomy and CABG.  Aortic Atherosclerosis (ICD10-I70.0).  Diverticular disease without evidence of acute diverticulitis.  Electronically Signed   By: Corrie Mckusick D.O.   On: 05/06/2018 09:57 CT Chest W Contrast CLINICAL DATA:  59 year old male with a history of unexplained weight loss  EXAM: CT CHEST, ABDOMEN, AND PELVIS WITH CONTRAST  TECHNIQUE: Multidetector CT imaging of the chest, abdomen and pelvis was performed following the standard protocol during bolus administration of intravenous contrast.  CONTRAST:  15mL OMNIPAQUE IOHEXOL 300 MG/ML  SOLN  COMPARISON:  None.  FINDINGS: CT CHEST FINDINGS  Cardiovascular:  Heart:  Surgical changes of median sternotomy and CABG. Native coronary calcifications  Aorta:  Unremarkable course, caliber, contour of the thoracic aorta. No aneurysm or dissection flap. No periaortic fluid.  Calcifications of the aortic arch. Branch vessels are patent. Descending thoracic aorta with mild atherosclerotic changes.  Pulmonary arteries:  Study is not tailored for the most sensitive evaluation of the pulmonary arteries, however, no central filling defects identified.  Mediastinum/Nodes: Small lymph nodes of the mediastinum. Calcified lymph nodes in the subcarinal and left hilar nodal stations. Unremarkable appearance of the thoracic esophagus.  Unremarkable appearance of the thoracic inlet.  Lungs/Pleura: Central airways are clear. No pleural effusion. No confluent airspace disease.  No pneumothorax.  Musculoskeletal: No acute displaced fracture. Degenerative changes of the spine.  Review of the MIP images confirms the above findings.  CT ABDOMEN PELVIS FINDINGS  Hepatobiliary: Unremarkable appearance of liver. Unremarkable gallbladder  Pancreas: Unremarkable pancreas  Spleen: Punctate calcifications throughout the spleen, compatible with prior granulomatous disease.  Adrenals/Urinary Tract: Unremarkable appearance of the adrenal glands. No evidence of hydronephrosis of the right or left kidney. No nephrolithiasis. Unremarkable course of the bilateral ureters. Unremarkable appearance of the urinary bladder.  Stomach/Bowel: Unremarkable stomach. Unremarkable small bowel. Appendectomy. Moderate stool burden. Colonic diverticular disease without evidence of acute inflammation.  Vascular/Lymphatic: Atherosclerotic changes of the abdominal aorta. Mesenteric arteries and bilateral renal arteries are patent. Bilateral iliac arteries and proximal femoral arteries patent.  Reproductive: Diameter of the prostate measures 4.4 cm  Other: None  Musculoskeletal: No acute displaced fracture.  Degenerative changes.  IMPRESSION: No acute CT finding.  No finding to account for weight loss.  Surgical changes of median sternotomy and CABG.  Aortic Atherosclerosis  (ICD10-I70.0).  Diverticular disease without evidence of acute diverticulitis.  Electronically Signed   By: Corrie Mckusick D.O.   On: 05/06/2018 09:57 Note: Reviewed        Physical Exam  General appearance: Well nourished, well developed, and well hydrated. In no apparent acute distress Mental status: Alert, oriented x 3 (person, place, & time)       Respiratory: No evidence of acute respiratory distress Eyes: PERLA Vitals: BP 120/77   Pulse 85   Temp (!) 96.8 F (36 C) (Temporal)   Resp 16   Ht $R'5\' 11"'uo$  (1.803 m)   Wt 161 lb (73 kg)   SpO2 100%   BMI 22.45 kg/m  BMI: Estimated body mass index is 22.45 kg/m as calculated from the following:   Height as of this encounter: $RemoveBeforeD'5\' 11"'YCTzWmHyuUhWzo$  (1.803 m).   Weight as of this encounter: 161 lb (73 kg). Ideal: Ideal body weight: 75.3 kg (166 lb 0.1 oz)  Lumbar Spine Area Exam  Skin & Axial Inspection: No masses, redness, or swelling Alignment: Symmetrical Functional ROM: Unrestricted ROM       Stability: No instability detected Muscle Tone/Strength: Functionally intact. No obvious neuro-muscular anomalies detected. Sensory (Neurological): Musculoskeletal pain  pattern   Gait & Posture Assessment  Ambulation: Unassisted Gait: Relatively normal for age and body habitus Posture: WNL   Assessment   Status Diagnosis  Controlled Controlled Controlled 1. Lumbar radiculopathy   2. Lumbar facet arthropathy   3. Piriformis muscle pain   4. Lumbar degenerative disc disease   5. Chronic pain syndrome       Plan of Care  Shawn Crawford has a current medication list which includes the following long-term medication(s): amlodipine, belbuca, rosuvastatin, tramadol, and zolpidem.  Pharmacotherapy (Medications Ordered): Meds ordered this encounter  Medications  . traMADol (ULTRAM) 50 MG tablet    Sig: Take 1 tablet (50 mg total) by mouth every 8 (eight) hours as needed for severe pain. Month last 30 days.    Dispense:  90 tablet     Refill:  3    [place] STOP ACT - Not applicable. Fill one day early if pharmacy is closed on scheduled refill date. .  . Buprenorphine HCl (BELBUCA) 450 MCG FILM    Sig: Place 1 Film (450 mcg total) inside cheek 2 (two) times daily. For chronic pain syndrome    Dispense:  60 each    Refill:  3   Orders:  Orders Placed This Encounter  Procedures  . ToxASSURE Select 13 (MW), Urine    Volume: 30 ml(s). Minimum 3 ml of urine is needed. Document temperature of fresh sample. Indications: Long term (current) use of opiate analgesic [id number])    Order Specific Question:   Release to patient    Answer:   Immediate   Follow-up plan:   Return in about 4 months (around 11/24/2020) for Medication Management, in person.   Recent Visits No visits were found meeting these conditions. Showing recent visits within past 90 days and meeting all other requirements Today's Visits Date Type Provider Dept  07/24/20 Office Visit Shawn Santa, MD [facility]  Showing today's visits and meeting all other requirements Future Appointments No visits were found meeting these conditions. Showing future appointments within next 90 days and meeting all other requirements  I discussed the assessment and treatment plan with the patient. The patient was provided an opportunity to ask questions and all were answered. The patient agreed with the plan and demonstrated an understanding of the instructions.  Patient advised to call back or seek an in-person evaluation if the symptoms or condition worsens.  Duration of encounter: 30 minutes.  Note by: Shawn Santa, MD Date: 07/24/2020; Time: 8:52 AM

## 2020-07-24 NOTE — Progress Notes (Signed)
Nursing Pain Medication Assessment:  Safety precautions to be maintained throughout the outpatient stay will include: orient to surroundings, keep bed in low position, maintain call bell within reach at all times, provide assistance with transfer out of bed and ambulation.  Medication Inspection Compliance: Pill count conducted under aseptic conditions, in front of the patient. Neither the pills nor the bottle was removed from the patient's sight at any time. Once count was completed pills were immediately returned to the patient in their original bottle.  Medication #1: Belbuca Pill/Patch Count: 47 of 60 pills remain Pill/Patch Appearance: Markings consistent with prescribed medication Bottle Appearance: Standard pharmacy container. Clearly labeled. Filled Date: 07/13/2020 Last Medication intake:  Today  Medication #2: Tramadol (Ultram) Pill/Patch Count: 73 of 90 pills remain Pill/Patch Appearance: Markings consistent with prescribed medication Bottle Appearance: Standard pharmacy container. Clearly labeled. Filled Date: 06/14/2020 Last Medication intake:  Shawn Crawford

## 2020-07-26 ENCOUNTER — Encounter: Payer: Self-pay | Admitting: Psychiatry

## 2020-07-26 ENCOUNTER — Ambulatory Visit (INDEPENDENT_AMBULATORY_CARE_PROVIDER_SITE_OTHER): Payer: 59 | Admitting: Psychiatry

## 2020-07-26 ENCOUNTER — Other Ambulatory Visit: Payer: Self-pay

## 2020-07-26 DIAGNOSIS — F4321 Adjustment disorder with depressed mood: Secondary | ICD-10-CM | POA: Diagnosis not present

## 2020-07-26 NOTE — Progress Notes (Signed)
[facility] Counselor/Therapist Progress Note  Patient ID: Shawn Crawford, MRN: [id number],    Date: 07/26/2020  Time Spent: 50 minutes   Treatment Type: Individual Therapy  Reported Symptoms: sad, indecisive, malaise  Mental Status Exam:  Appearance:   Casual     Behavior:  Appropriate  Motor:  Normal  Speech/Language:   Clear and Coherent  Affect:  Depressed  Mood:  sad  Thought process:  normal  Thought content:    WNL  Sensory/Perceptual disturbances:    WNL  Orientation:  oriented to person, place, time/date and situation  Attention:  Good  Concentration:  Good  Memory:  WNL  Fund of knowledge:   Good  Insight:    Good  Judgment:   Good  Impulse Control:  Good   Risk Assessment: Danger to Self:  No Self-injurious Behavior: No Danger to Others: No Duty to Warn:no Physical Aggression / Violence:No  Access to Firearms a concern: No  Gang Involvement:No   Subjective: The client states that he needs to do something different with his career.  To make the necessary changes he states, "I need to be able to say no.  Can I do it?"  Today we started with eye-movement focusing on this issue.  He feels worry and anxiety in the small of his back.  He feels an overall resistance within himself.  His subjective units of distress is a 3+.  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  He is also feeling closer to his girlfriend.  He states it feels right "but".  He wants to be cautious and make sure that this is the right move for him.  As he continued to process the clients sense of his worry in his lower back was almost nonexistent. I asked the client about his back pain since that is a  chronic source of his overall pain.  He states that it started in 2013 when his youngest son moved out of the house and went to [place].  "We were close and I have lost a good friend."  I switched to the bilateral stimulation hand paddles.  I had the client focus on his lower back where the pain was.  He described it as a knot with strings coming out that kept getting pulled tighter.  I asked the client what other things happened around that time.?  His first wife had committed suicide just a few years earlier.  He began to asked the question what would it be like if we were still married?  He started noticing some sadness.  "I do miss her."  The client then began to become very tearful.  He felt a large amount of sadness I encouraged the client to allow himself to emote.  I suggested that maybe he had been holding this in for a long time because he was so over responsible?  He had to take care of everyone else and fix things.  The client agreed this might be true.  I suggested to the client that he take some time and write a letter to his wife.  I noted that it Lyliana Dicenso not happen  in 1 sitting but to give himself the opportunity to write as much as he needed.  I then suggested that he read it out loud to himself or he could bring it in here for [place] to process.  The client agreed that he would do this.  He felt much calmer and he noticed that his back pain was significantly less.  Interventions: Motivational Interviewing, Solution-Oriented/Positive Psychology, [employer] Desensitization and Reprocessing (EMDR) and Insight-Oriented  Diagnosis:   ICD-10-CM   1. 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  Fortune Brannigan, [facility]

## 2020-07-27 LAB — TOXASSURE SELECT 13 (MW), URINE

## 2020-08-13 ENCOUNTER — Ambulatory Visit: Payer: 59 | Attending: Internal Medicine

## 2020-08-13 DIAGNOSIS — Z23 Encounter for immunization: Secondary | ICD-10-CM

## 2020-08-13 NOTE — Progress Notes (Signed)
   Covid-19 Vaccination Clinic  Name:  Shawn Crawford    MRN: [id number] DOB: Apr 22, 1961  08/13/2020  [name] was observed post Covid-19 immunization for 15 minutes without incident. He was provided with Vaccine Information Sheet and instruction to access the V-Safe system.   [name] was instructed to call 911 with any severe reactions post vaccine: Marland Kitchen Difficulty breathing  . Swelling of face and throat  . A fast heartbeat  . A bad rash all over body  . Dizziness and weakness   Immunizations Administered    Name Date Dose VIS Date Route   Moderna COVID-19 Vaccine 08/13/2020  3:50 PM 0.5 mL 07/18/2020 Intramuscular   Manufacturer: Moderna   Lot: [id number]   [place]: 54562-563-89

## 2020-08-15 ENCOUNTER — Ambulatory Visit: Payer: 59 | Admitting: Dermatology

## 2020-08-15 ENCOUNTER — Other Ambulatory Visit: Payer: Self-pay

## 2020-08-15 DIAGNOSIS — L570 Actinic keratosis: Secondary | ICD-10-CM

## 2020-08-15 DIAGNOSIS — Z872 Personal history of diseases of the skin and subcutaneous tissue: Secondary | ICD-10-CM

## 2020-08-15 DIAGNOSIS — L821 Other seborrheic keratosis: Secondary | ICD-10-CM | POA: Diagnosis not present

## 2020-08-15 DIAGNOSIS — L578 Other skin changes due to chronic exposure to nonionizing radiation: Secondary | ICD-10-CM | POA: Diagnosis not present

## 2020-08-15 NOTE — Progress Notes (Signed)
   Follow-Up Visit   Subjective  Shawn Crawford is a 59 y.o. male who presents for the following: Actinic Keratosis (Follow up of right ear sup helix, scalp treated with LN2 x11).  He has other areas to be checked today.  The following portions of the chart were reviewed this encounter and updated as appropriate:  Tobacco  Allergies  Meds  Problems  Med Hx  Surg Hx  Fam Hx     Review of Systems:  No other skin or systemic complaints except as noted in HPI or Assessment and Plan.  Objective  Well appearing patient in no apparent distress; mood and affect are within normal limits.  A focused examination was performed including scalp, face, ears. Relevant physical exam findings are noted in the Assessment and Plan.  Objective  Right Ear: Clear today  Objective  Scalp: Erythematous thin papules/macules with gritty scale.    Assessment & Plan    Actinic Damage - chronic, secondary to cumulative UV radiation exposure/sun exposure over time - diffuse scaly erythematous macules with underlying dyspigmentation - Recommend daily broad spectrum sunscreen SPF 30+ to sun-exposed areas, reapply every 2 hours as needed.  - Call for new or changing lesions. - Discussed "Field Treatment" for Severe, Confluent Actinic Changes with Pre-Cancerous Actinic Keratoses due to cumulative sun exposure/UV radiation exposure over time Field treatment involves treatment of an entire area of skin that has confluent Actinic Changes (Sun/ Ultraviolet light damage) and PreCancerous Actinic Keratoses by method of PhotoDynamic Therapy (PDT) and/or prescription Topical Chemotherapy agents such as 5-fluorouracil, 5-fluorouracil/calcipotriene, and/or imiquimod.  The purpose is to decrease the number of clinically evident and subclinical PreCancerous lesions to prevent progression to development of skin cancer by chemically destroying early precancer changes that may or may not be visible.  It has been shown to reduce  the risk of developing skin cancer in the treated area. As a result of treatment, redness, scaling, crusting, and open sores may occur during treatment course. One or more than one of these methods may be used and may have to be used several times to control, suppress and eliminate the PreCancerous changes. Discussed treatment course, expected reaction, and possible side effects. Plan field treatment on follow-up.  Seborrheic Keratoses - Stuck-on, waxy, tan-brown papules and plaques  - Discussed benign etiology and prognosis. - Observe - Call for any changes  History of actinic keratoses Right Ear Clear today  AK (actinic keratosis) Scalp Destruction of lesion - Scalp Complexity: simple   Destruction method: cryotherapy   Informed consent: discussed and consent obtained   Timeout:  patient name, date of birth, surgical site, and procedure verified Lesion destroyed using liquid nitrogen: Yes   Region frozen until ice ball extended beyond lesion: Yes   Outcome: patient tolerated procedure well with no complications   Post-procedure details: wound care instructions given    Return for 6-12 months.  I, Ashok Cordia, CMA, am acting as scribe for Sarina Ser, MD .  Documentation: I have reviewed the above documentation for accuracy and completeness, and I agree with the above.  Sarina Ser, MD

## 2020-08-15 NOTE — Patient Instructions (Signed)

## 2020-08-16 ENCOUNTER — Other Ambulatory Visit: Payer: Self-pay | Admitting: Family Medicine

## 2020-08-16 DIAGNOSIS — I10 Essential (primary) hypertension: Secondary | ICD-10-CM

## 2020-08-17 ENCOUNTER — Encounter: Payer: Self-pay | Admitting: Dermatology

## 2020-08-22 ENCOUNTER — Ambulatory Visit: Payer: Self-pay | Admitting: Family Medicine

## 2020-08-29 ENCOUNTER — Ambulatory Visit: Payer: 59 | Admitting: Family Medicine

## 2020-08-29 ENCOUNTER — Other Ambulatory Visit: Payer: Self-pay

## 2020-08-29 ENCOUNTER — Encounter: Payer: Self-pay | Admitting: Family Medicine

## 2020-08-29 VITALS — BP 123/73 | HR 86 | Temp 98.3°F | Resp 16 | Ht 71.0 in | Wt 170.0 lb

## 2020-08-29 DIAGNOSIS — I2510 Atherosclerotic heart disease of native coronary artery without angina pectoris: Secondary | ICD-10-CM

## 2020-08-29 DIAGNOSIS — I10 Essential (primary) hypertension: Secondary | ICD-10-CM

## 2020-08-29 DIAGNOSIS — E291 Testicular hypofunction: Secondary | ICD-10-CM | POA: Diagnosis not present

## 2020-08-29 DIAGNOSIS — R7989 Other specified abnormal findings of blood chemistry: Secondary | ICD-10-CM

## 2020-08-29 DIAGNOSIS — M5416 Radiculopathy, lumbar region: Secondary | ICD-10-CM

## 2020-08-29 DIAGNOSIS — E559 Vitamin D deficiency, unspecified: Secondary | ICD-10-CM

## 2020-08-29 DIAGNOSIS — N529 Male erectile dysfunction, unspecified: Secondary | ICD-10-CM

## 2020-08-29 DIAGNOSIS — E785 Hyperlipidemia, unspecified: Secondary | ICD-10-CM

## 2020-08-29 NOTE — Progress Notes (Signed)
I,Shawn Crawford,acting as a scribe for Shawn Durie, MD.,have documented all relevant documentation on the behalf of Shawn Durie, MD,as directed by  Shawn Durie, MD while in the presence of Shawn Durie, MD.   Established patient visit   Patient: Shawn Crawford   DOB: 09/08/1961   59 y.o. Male  MRN: [id number] Visit Date: 08/29/2020  Today's healthcare provider: [name], MD   Chief Complaint  Patient presents with  . Follow-up  . Hypertension   Subjective    HPI  Patient was in today for follow-up.  All his cardiovascular issues are stable and has no problems.  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 He does complain of decreased energy and decreased initiative over the past 4 to 6 months.  He has some fatigue and decreased libido in ED also.  He is tolerating clomiphene but is willing to consider other therapy.  He did not really respond well to AndroGel. Hypertension, follow-up  BP Readings from Last 3 Encounters:  08/29/20 123/73  07/24/20 120/77  05/02/20 (!) 134/104   Wt Readings from Last 3 Encounters:  08/29/20 170 lb (77.1 kg)  07/24/20 161 lb (73 kg)  05/01/20 162 lb (73.5 kg)     He was last seen for hypertension 4 months ago.  BP at that visit was 115/72. Management since that visit includes; Controlled. He reports good compliance with treatment. He is not having side effects. none He is exercising. He is not adherent to low salt diet.   Outside blood pressures are 122/78.  He does not smoke.  Use of agents associated with hypertension: none.   ------------------------------------------------------------------- Hypogonadism From 04/17/2020-On Clomid. Labs checked showing-stable.      Medications: Outpatient Medications Prior to Visit  Medication Sig  . amLODipine (NORVASC) 5 MG tablet TAKE 1 TABLET BY MOUTH  DAILY  . aspirin EC 81 MG tablet Take 81 mg by mouth daily.  . Buprenorphine HCl (BELBUCA)  450 MCG FILM Place 1 Film (450 mcg total) inside cheek 2 (two) times daily. For chronic pain syndrome  . clomiPHENE (CLOMID) 50 MG tablet Take 0.5 tablets (25 mg total) by mouth daily.  Marland Kitchen desonide (DESOWEN) 0.05 % ointment Apply topically 3 (three) times a week. Apply topically on Mondays, Wednesdays, and Fridays.  Marland Kitchen ketoconazole (NIZORAL) 2 % cream Apply topically on Tuesdays, Thursdays, and Saturdays.  . MULTIPLE VITAMINS PO Take 1 tablet by mouth daily.   . rosuvastatin (CRESTOR) 40 MG tablet TAKE 1 TABLET BY MOUTH  DAILY  . traMADol (ULTRAM) 50 MG tablet Take 1 tablet (50 mg total) by mouth every 8 (eight) hours as needed for severe pain. Month last 30 days.  Marland Kitchen zolpidem (AMBIEN CR) 12.5 MG CR tablet TAKE ONE TABLET BY MOUTH AT BEDTIME AS NEEDED FOR SLEEP. (STOP ZOLPIDEM 10 MG)   No facility-administered medications prior to visit.    Review of Systems  Constitutional: Negative for appetite change, chills and fever.  Respiratory: Negative for chest tightness, shortness of breath and wheezing.   Cardiovascular: Negative for chest pain and palpitations.  Gastrointestinal: Negative for abdominal pain, nausea and vomiting.       Objective    BP 123/73 (BP Location: Right Arm, Patient Position: Sitting, Cuff Size: Large)   Pulse 86   Temp 98.3 F (36.8 C) (Oral)   Resp 16   Ht 5\' 11"  (1.803 m)   Wt 170 lb (77.1 kg)   SpO2 100%  BMI 23.71 kg/m  BP Readings from Last 3 Encounters:  08/29/20 123/73  07/24/20 120/77  05/02/20 (!) 134/104   Wt Readings from Last 3 Encounters:  08/29/20 170 lb (77.1 kg)  07/24/20 161 lb (73 kg)  05/01/20 162 lb (73.5 kg)      Physical Exam Vitals reviewed.  Constitutional:      Appearance: Normal appearance.  HENT:     Head: Normocephalic and atraumatic.     Right Ear: Tympanic membrane normal.     Left Ear: Tympanic membrane normal.     Mouth/Throat:     Mouth: Mucous membranes are moist.     Pharynx: Oropharynx is clear.  Eyes:      General: No scleral icterus.    Conjunctiva/sclera: Conjunctivae normal.  Neck:     Vascular: No carotid bruit.  Cardiovascular:     Rate and Rhythm: Normal rate and regular rhythm.     Pulses: Normal pulses.     Heart sounds: Normal heart sounds.  Pulmonary:     Effort: Pulmonary effort is normal.     Breath sounds: Normal breath sounds.  Abdominal:     General: Bowel sounds are normal.  Genitourinary:    Penis: Normal.      Testes: Normal.  Musculoskeletal:     Cervical back: Normal range of motion.  Skin:    General: Skin is warm and dry.  Neurological:     Mental Status: He is alert and oriented to person, place, and time. Mental status is at baseline.  Psychiatric:        Mood and Affect: Mood normal.        Behavior: Behavior normal.        Thought Content: Thought content normal.        Judgment: Judgment normal.       No results found for any visits on 08/29/20.  Assessment & Plan     1. Low testosterone Check testosterone level and consider trying testosterone parenterally.  He is willing to try this.  He has formal training as a paramedic. - Testosterone  2. Hypogonadism in male   3. Arteriosclerosis of coronary artery All risk factors treated  4. Benign essential HTN   5. Lumbar radiculopathy Treatment per pain clinic.  6. Avitaminosis D   7. Hyperlipidemia, unspecified hyperlipidemia type On statin.  I certainly do not think this is a cause of his fatigue.  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   No follow-ups on file.         Reda Gettis Cranford Mon, MD  [facility] [phone] (phone) [phone] (fax)  [place]

## 2020-08-31 LAB — TESTOSTERONE: Testosterone: 456 ng/dL (ref 264–916)

## 2020-09-03 ENCOUNTER — Other Ambulatory Visit: Payer: Self-pay

## 2020-09-03 ENCOUNTER — Encounter: Payer: Self-pay | Admitting: Psychiatry

## 2020-09-03 ENCOUNTER — Ambulatory Visit (INDEPENDENT_AMBULATORY_CARE_PROVIDER_SITE_OTHER): Payer: 59 | Admitting: Psychiatry

## 2020-09-03 DIAGNOSIS — F4321 Adjustment disorder with depressed mood: Secondary | ICD-10-CM

## 2020-09-03 NOTE — Progress Notes (Signed)
[facility] Counselor/Therapist Progress Note  Patient ID: Shawn Crawford, MRN: [id number],    Date: 09/03/2020  Time Spent: 50 minutes   Treatment Type: Individual Therapy  Reported Symptoms: sadness, lack of interest  Mental Status Exam:  Appearance:   Well Groomed     Behavior:  Appropriate  Motor:  Normal  Speech/Language:   Clear and Coherent  Affect:  Appropriate  Mood:  sad  Thought process:  normal  Thought content:    WNL  Sensory/Perceptual disturbances:    WNL  Orientation:  oriented to person, place, time/date and situation  Attention:  Good  Concentration:  Good  Memory:  WNL  Fund of knowledge:   Good  Insight:    Good  Judgment:   Good  Impulse Control:  Good   Risk Assessment: Danger to Self:  No Self-injurious Behavior: No Danger to Others: No Duty to Warn:no Physical Aggression / Violence:No  Access to Firearms a concern: No  Gang Involvement:No   Subjective: "I am trying to find every imperfection with my current girlfriend.  I know it is wrong."  The client states that the woman he is currently dating seems to be perfect in every way.  The client admits that he keeps seeing how she matches up to his first wife who committed suicide.  Today I started with eye-movement and the bilateral stimulation hand paddles with the client focusing on the client's girlfriend.  His negative cognition is, "I am not emotionally connected.  I cannot relax."  He feels frustration in his back and legs.  His subjective units of distress is a 6.  As the client processed he described not wanting to kiss his girlfriend and trying to avoid holding hands with her.  If they sit on the couch he does not want her to rub his leg.  As the client continued to process he admitted that he was not sexually attracted to his girlfriend.  She checks all the boxes in every other category.  I asked the client what he thought the problem was?  His first and second wife were both very petite.   This girlfriend is tall and heavier set.  He does not fault her for it but feels unattracted to her.  As we continued to process this I challenged the client as to why he continues in the relationship?  He states that he does not want to be alone.  I pointed out to the client that in his previous 2 marriages he made poor choices that resulted in his first wife having an affair and leaving him and his second wife being mean to him.  He states he just married his second wife so his children would have a mother.  I explained to the client that he needed to be honest about his emotions and what he feels.  He agrees.  "I need to break up."  The client agrees that he is going to do this before the Christmas holiday.  He states he feels much more relieved that he has come to this decision.  His subjective units of distress was less than 3.  He agrees that he needs to mindfully pray about what the future holds.  Interventions: Assertiveness/Communication, Motivational Interviewing, Solution-Oriented/Positive Psychology, [employer] Desensitization and Reprocessing (EMDR) and Insight-Oriented  Diagnosis:   ICD-10-CM   1. Adjustment disorder with depressed mood  F43.21     Plan: Break-up with girlfriend, mood independent behavior, positive self talk, self-care, radical  acceptance, mindful prayer, assertiveness, boundaries.  Alyha Marines, [facility]

## 2020-09-05 ENCOUNTER — Encounter: Payer: Self-pay | Admitting: Family Medicine

## 2020-09-06 ENCOUNTER — Other Ambulatory Visit: Payer: Self-pay | Admitting: Family Medicine

## 2020-09-06 DIAGNOSIS — E785 Hyperlipidemia, unspecified: Secondary | ICD-10-CM

## 2020-09-13 ENCOUNTER — Encounter: Payer: Self-pay | Admitting: Student in an Organized Health Care Education/Training Program

## 2020-09-13 ENCOUNTER — Other Ambulatory Visit: Payer: Self-pay | Admitting: Family Medicine

## 2020-09-13 DIAGNOSIS — I10 Essential (primary) hypertension: Secondary | ICD-10-CM

## 2020-09-13 DIAGNOSIS — E291 Testicular hypofunction: Secondary | ICD-10-CM

## 2020-09-13 MED ORDER — TESTOSTERONE CYPIONATE 200 MG/ML IM SOLN
100.0000 mg | INTRAMUSCULAR | 2 refills | Status: DC
Start: 2020-09-13 — End: 2020-12-13

## 2020-09-17 ENCOUNTER — Ambulatory Visit: Payer: 59 | Admitting: Psychiatry

## 2020-10-01 ENCOUNTER — Ambulatory Visit: Payer: 59 | Admitting: Psychiatry

## 2020-10-09 ENCOUNTER — Ambulatory Visit
Payer: 59 | Attending: Student in an Organized Health Care Education/Training Program | Admitting: Student in an Organized Health Care Education/Training Program

## 2020-10-09 ENCOUNTER — Other Ambulatory Visit: Payer: Self-pay

## 2020-10-09 ENCOUNTER — Encounter: Payer: Self-pay | Admitting: Student in an Organized Health Care Education/Training Program

## 2020-10-09 DIAGNOSIS — M7918 Myalgia, other site: Secondary | ICD-10-CM | POA: Diagnosis not present

## 2020-10-09 DIAGNOSIS — M47816 Spondylosis without myelopathy or radiculopathy, lumbar region: Secondary | ICD-10-CM | POA: Diagnosis not present

## 2020-10-09 DIAGNOSIS — G894 Chronic pain syndrome: Secondary | ICD-10-CM

## 2020-10-09 DIAGNOSIS — M5416 Radiculopathy, lumbar region: Secondary | ICD-10-CM

## 2020-10-09 DIAGNOSIS — M5136 Other intervertebral disc degeneration, lumbar region: Secondary | ICD-10-CM

## 2020-10-09 MED ORDER — BELBUCA 600 MCG BU FILM: 1.0000 | ORAL_FILM | Freq: Two times a day (BID) | BUCCAL | 1 refills | Status: DC | PRN

## 2020-10-09 NOTE — Progress Notes (Signed)
Patient: Shawn Crawford  Service Category: E/M  Provider: Gillis Santa, MD  DOB: 12/21/1960  DOS: 10/09/2020  Location: Office  MRN: [id number]  Setting: Ambulatory outpatient  Referring Provider: Jerrol Banana.,*  Type: Established Patient  Specialty: Interventional Pain Management  PCP: Jerrol Banana., MD  Location: Home  Delivery: TeleHealth     Virtual Encounter - Pain Management PROVIDER NOTE: Information contained herein reflects review and annotations entered in association with encounter. Interpretation of such information and data should be left to medically-trained personnel. Information provided to patient can be located elsewhere in the medical record under "Patient Instructions". Document created using STT-dictation technology, any transcriptional errors that may result from process are unintentional.    Contact & Pharmacy Preferred: [phone] Home: [phone] (home) Mobile: [phone] (mobile) E-mail: [email or web address]  [place], [place] - [place] [address] Phone: [phone] Fax: [phone]  [place], [place], [address] [place], [place] [address] [place] [address] Phone: [phone] Fax: [phone]   Pre-screening  Shawn Crawford offered "in-person" vs "virtual" encounter. He indicated preferring virtual for this encounter.   Reason COVID-19*  Social distancing based on CDC and AMA recommendations.   I contacted Shawn Crawford on 10/09/2020 via video conference.      I clearly identified myself as Gillis Santa, MD. I verified that I was speaking with the correct person using two identifiers (Name: Shawn Crawford, and date of birth: 05-28-1961).  Consent I sought verbal advanced consent from Shawn Crawford for virtual visit interactions. I informed Shawn Crawford of possible security and privacy concerns, risks, and limitations associated with providing  "not-in-person" medical evaluation and management services. I also informed Shawn Crawford of the availability of "in-person" appointments. Finally, I informed him that there would be a charge for the virtual visit and that he could be  personally, fully or partially, financially responsible for it. Shawn Crawford expressed understanding and agreed to proceed.   Historic Elements   Shawn Crawford is a 60 y.o. year old, male patient evaluated today after our last contact on 07/24/2020. Shawn Crawford  has a past medical history of Actinic keratosis, Dysplastic nevus (09/06/2014), Dysplastic nevus (07/09/2015), cardiac catheterization, Hyperlipidemia, and Hypertension. He also  has a past surgical history that includes Coronary artery bypass graft; Hernia repair; Appendectomy; Cardiac catheterization; Colonoscopy with propofol (N/A, 06/24/2018); Esophagogastroduodenoscopy (egd) with propofol (N/A, 06/24/2018); and Exam under anesthesia with hemorrhoidectomy (N/A, 11/11/2018). Shawn Crawford has a current medication list which includes the following prescription(s): amlodipine, aspirin ec, belbuca, clomiphene, desonide, ketoconazole, multiple vitamin, rosuvastatin, testosterone cypionate, tramadol, and zolpidem. He  reports that he has never smoked. He has never used smokeless tobacco. He reports current alcohol use. He reports that he does not use drugs. Shawn Crawford is allergic to penicillins.   HPI  Today, he is being contacted for medication management.  Virtual visit today for increased pain that is becoming more difficult to manage on his current regimen.  Patient is on belbuca 450 mcg twice daily as well as tramadol 50 mg 3 times daily as needed.  He does find benefit with belbuca in helping to manage his chronic pain.  We discussed increasing his belbuca dose to 600 mcg twice daily rather than escalating his tramadol which I would like for him to utilize more for severe breakthrough pain.  He usually takes 2 tramadol's in the  evening before bed to help him sleep as well as increased pain if he has exerted himself during the day.  Pharmacotherapy Assessment  Analgesic: Tramadol 50 mg 3 times daily as needed.  Increase belbuca from 450  mcg twice daily to 600 mcg twice daily.   Monitoring: [place] PMP: PDMP reviewed during this encounter.       Pharmacotherapy: No side-effects or adverse reactions reported. Compliance: No problems identified. Effectiveness: Clinically acceptable. Plan: Refer to "POC".  UDS:  Summary  Date Value Ref Range Status  07/24/2020 Note  Final    Comment:    ==================================================================== [employer] Select 13 (MW) ==================================================================== Test                             Result       Flag       Units  Drug Present and Declared for Prescription Verification   Buprenorphine                  33           EXPECTED   ng/mg creat   Norbuprenorphine               107          EXPECTED   ng/mg creat    Source of buprenorphine is a scheduled prescription medication.    Norbuprenorphine is an expected metabolite of buprenorphine.    Tramadol                       >2347        EXPECTED   ng/mg creat   O-Desmethyltramadol            >2347        EXPECTED   ng/mg creat   N-Desmethyltramadol            1743         EXPECTED   ng/mg creat    Source of tramadol is a prescription medication. O-desmethyltramadol    and N-desmethyltramadol are expected metabolites of tramadol.  ==================================================================== Test                      Result    Flag   Units      Ref Range   Creatinine              213              mg/dL      >=20 ==================================================================== Declared Medications:  The flagging and interpretation on this report are based on the  following declared medications.  Unexpected results may arise from  inaccuracies in the declared  medications.   **Note: The testing scope of this panel includes these medications:   Tramadol (Ultram)   **Note: The testing scope of this panel does not include small to  moderate amounts of these reported medications:   Buprenorphine (Belbuca)   **Note: The testing scope of this panel does not include the  following reported medications:   Amlodipine (Norvasc)  Aspirin  Clomiphene  Desonide (Desowen)  Ketoconazole (Nizoral)  Rosuvastatin (Crestor)  Zolpidem (Ambien) ==================================================================== For clinical consultation, please call [phone]. ====================================================================     Laboratory Chemistry Profile   Renal Lab Results  Component Value Date   BUN 13 04/18/2020   CREATININE 0.99 04/18/2020   BCR 13 04/18/2020   GFRAA 96 04/18/2020   GFRNONAA 83  04/18/2020     Hepatic Lab Results  Component Value Date   AST 23 04/18/2020   ALT 16 04/18/2020   ALBUMIN 4.8 04/18/2020   ALKPHOS 77 04/18/2020     Electrolytes Lab Results  Component Value Date   NA 139 04/18/2020   K 5.0 04/18/2020   CL 100 04/18/2020   CALCIUM 9.6 04/18/2020     Bone Lab Results  Component Value Date   TESTOFREE 5.1 (L) 11/09/2019   TESTOSTERONE 456 08/30/2020     Inflammation (CRP: Acute Phase) (ESR: Chronic Phase) Lab Results  Component Value Date   ESRSEDRATE 2 08/06/2018       Note: Above Lab results reviewed.  Imaging  CT Abdomen Pelvis W Contrast CLINICAL DATA:  60 year old male with a history of unexplained weight loss  EXAM: CT CHEST, ABDOMEN, AND PELVIS WITH CONTRAST  TECHNIQUE: Multidetector CT imaging of the chest, abdomen and pelvis was performed following the standard protocol during bolus administration of intravenous contrast.  CONTRAST:  153mL OMNIPAQUE IOHEXOL 300 MG/ML  SOLN  COMPARISON:  None.  FINDINGS: CT CHEST  FINDINGS  Cardiovascular:  Heart:  Surgical changes of median sternotomy and CABG. Native coronary calcifications  Aorta:  Unremarkable course, caliber, contour of the thoracic aorta. No aneurysm or dissection flap. No periaortic fluid. Calcifications of the aortic arch. Branch vessels are patent. Descending thoracic aorta with mild atherosclerotic changes.  Pulmonary arteries:  Study is not tailored for the most sensitive evaluation of the pulmonary arteries, however, no central filling defects identified.  Mediastinum/Nodes: Small lymph nodes of the mediastinum. Calcified lymph nodes in the subcarinal and left hilar nodal stations. Unremarkable appearance of the thoracic esophagus.  Unremarkable appearance of the thoracic inlet.  Lungs/Pleura: Central airways are clear. No pleural effusion. No confluent airspace disease.  No pneumothorax.  Musculoskeletal: No acute displaced fracture. Degenerative changes of the spine.  Review of the MIP images confirms the above findings.  CT ABDOMEN PELVIS FINDINGS  Hepatobiliary: Unremarkable appearance of liver. Unremarkable gallbladder  Pancreas: Unremarkable pancreas  Spleen: Punctate calcifications throughout the spleen, compatible with prior granulomatous disease.  Adrenals/Urinary Tract: Unremarkable appearance of the adrenal glands. No evidence of hydronephrosis of the right or left kidney. No nephrolithiasis. Unremarkable course of the bilateral ureters. Unremarkable appearance of the urinary bladder.  Stomach/Bowel: Unremarkable stomach. Unremarkable small bowel. Appendectomy. Moderate stool burden. Colonic diverticular disease without evidence of acute inflammation.  Vascular/Lymphatic: Atherosclerotic changes of the abdominal aorta. Mesenteric arteries and bilateral renal arteries are patent. Bilateral iliac arteries and proximal femoral arteries patent.  Reproductive: Diameter of the prostate measures 4.4  cm  Other: None  Musculoskeletal: No acute displaced fracture.  Degenerative changes.  IMPRESSION: No acute CT finding.  No finding to account for weight loss.  Surgical changes of median sternotomy and CABG.  Aortic Atherosclerosis (ICD10-I70.0).  Diverticular disease without evidence of acute diverticulitis.  Electronically Signed   By: Corrie Mckusick D.O.   On: 05/06/2018 09:57 CT Chest W Contrast CLINICAL DATA:  60 year old male with a history of unexplained weight loss  EXAM: CT CHEST, ABDOMEN, AND PELVIS WITH CONTRAST  TECHNIQUE: Multidetector CT imaging of the chest, abdomen and pelvis was performed following the standard protocol during bolus administration of intravenous contrast.  CONTRAST:  170mL OMNIPAQUE IOHEXOL 300 MG/ML  SOLN  COMPARISON:  None.  FINDINGS: CT CHEST FINDINGS  Cardiovascular:  Heart:  Surgical changes of median sternotomy and CABG. Native coronary calcifications  Aorta:  Unremarkable course, caliber, contour of the thoracic  aorta. No aneurysm or dissection flap. No periaortic fluid. Calcifications of the aortic arch. Branch vessels are patent. Descending thoracic aorta with mild atherosclerotic changes.  Pulmonary arteries:  Study is not tailored for the most sensitive evaluation of the pulmonary arteries, however, no central filling defects identified.  Mediastinum/Nodes: Small lymph nodes of the mediastinum. Calcified lymph nodes in the subcarinal and left hilar nodal stations. Unremarkable appearance of the thoracic esophagus.  Unremarkable appearance of the thoracic inlet.  Lungs/Pleura: Central airways are clear. No pleural effusion. No confluent airspace disease.  No pneumothorax.  Musculoskeletal: No acute displaced fracture. Degenerative changes of the spine.  Review of the MIP images confirms the above findings.  CT ABDOMEN PELVIS FINDINGS  Hepatobiliary: Unremarkable appearance of liver.  Unremarkable gallbladder  Pancreas: Unremarkable pancreas  Spleen: Punctate calcifications throughout the spleen, compatible with prior granulomatous disease.  Adrenals/Urinary Tract: Unremarkable appearance of the adrenal glands. No evidence of hydronephrosis of the right or left kidney. No nephrolithiasis. Unremarkable course of the bilateral ureters. Unremarkable appearance of the urinary bladder.  Stomach/Bowel: Unremarkable stomach. Unremarkable small bowel. Appendectomy. Moderate stool burden. Colonic diverticular disease without evidence of acute inflammation.  Vascular/Lymphatic: Atherosclerotic changes of the abdominal aorta. Mesenteric arteries and bilateral renal arteries are patent. Bilateral iliac arteries and proximal femoral arteries patent.  Reproductive: Diameter of the prostate measures 4.4 cm  Other: None  Musculoskeletal: No acute displaced fracture.  Degenerative changes.  IMPRESSION: No acute CT finding.  No finding to account for weight loss.  Surgical changes of median sternotomy and CABG.  Aortic Atherosclerosis (ICD10-I70.0).  Diverticular disease without evidence of acute diverticulitis.  Electronically Signed   By: Corrie Mckusick D.O.   On: 05/06/2018 09:57  Assessment  The primary encounter diagnosis was Lumbar radiculopathy. Diagnoses of Chronic pain syndrome, Lumbar facet arthropathy, Piriformis muscle pain, and Lumbar degenerative disc disease were also pertinent to this visit.  Plan of Care   Mr. JERMANIE MINSHALL has a current medication list which includes the following long-term medication(s): amlodipine, rosuvastatin, testosterone cypionate, tramadol, and zolpidem. Increased belbuca from 450 mcg twice daily to 600 mcg twice daily.  Continue tramadol as prescribed.  Continue with home physical therapy stretching exercises.  Pharmacotherapy (Medications Ordered): Meds ordered this encounter  Medications  . Buprenorphine HCl (BELBUCA)  600 MCG FILM    Sig: Place 1 Film inside cheek every 12 (twelve) hours as needed. For chronic pain syndrome    Dispense:  60 each    Refill:  1   Follow-up plan:   Return for Keep sch. appt.   Recent Visits Date Type Provider Dept  07/24/20 Office Visit Gillis Santa, MD [facility]  Showing recent visits within past 90 days and meeting all other requirements Today's Visits Date Type Provider Dept  10/09/20 Telemedicine Gillis Santa, MD [facility]  Showing today's visits and meeting all other requirements Future Appointments Date Type Provider Dept  11/22/20 Appointment Gillis Santa, MD [facility]  Showing future appointments within next 90 days and meeting all other requirements  I discussed the assessment and treatment plan with the patient. The patient was provided an opportunity to ask questions and all were answered. The patient agreed with the plan and demonstrated an understanding of the instructions.  Patient advised to call back or seek an in-person evaluation if the symptoms or condition worsens.  Duration of encounter: 20 minutes.  Note by: Gillis Santa, MD Date: 10/09/2020; Time: 1:39 PM

## 2020-10-15 ENCOUNTER — Ambulatory Visit: Payer: 59 | Admitting: Psychiatry

## 2020-10-29 ENCOUNTER — Encounter: Payer: Self-pay | Admitting: Family Medicine

## 2020-10-29 ENCOUNTER — Ambulatory Visit (INDEPENDENT_AMBULATORY_CARE_PROVIDER_SITE_OTHER): Payer: 59 | Admitting: Psychiatry

## 2020-10-29 ENCOUNTER — Encounter: Payer: Self-pay | Admitting: Psychiatry

## 2020-10-29 ENCOUNTER — Other Ambulatory Visit: Payer: Self-pay

## 2020-10-29 DIAGNOSIS — F4321 Adjustment disorder with depressed mood: Secondary | ICD-10-CM | POA: Diagnosis not present

## 2020-10-29 NOTE — Progress Notes (Signed)
      [facility] Counselor/Therapist Progress Note  Patient ID: Shawn Crawford, MRN: [id number],    Date: 10/29/2020  Time Spent: 50 minutes   Treatment Type: Individual Therapy  Reported Symptoms: sad  Mental Status Exam:  Appearance:   Well Groomed     Behavior:  Appropriate  Motor:  Normal  Speech/Language:   Clear and Coherent  Affect:  Appropriate  Mood:  sad  Thought process:  normal  Thought content:    WNL  Sensory/Perceptual disturbances:    WNL  Orientation:  oriented to person, place, time/date and situation  Attention:  Good  Concentration:  Good  Memory:  WNL  Fund of knowledge:   Good  Insight:    Good  Judgment:   Good  Impulse Control:  Good   Risk Assessment: Danger to Self:  No Self-injurious Behavior: No Danger to Others: No Duty to Warn:no Physical Aggression / Violence:No  Access to Firearms a concern: No  Gang Involvement:No   Subjective: The client states that he did break-up with his girlfriend before Christmas.  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  With work he has marked out of path to succession for his company.  That will happen on [employer] 1.  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  He will not have to keep a regular office hours.  He feels good about this decision. With the client's first ex-wife he realizes that he has put her up on a pedestal.  He compares all his relationships to her in a most idealized way.  "I want to feel that happiness again."  The client has had some dreams about his ex-wife where they hug and are affectionate.  He states he really wants that.  Today I used eye-movement with the client focusing on his first ex-wife.  His negative cognition was, "how good it was."  He feels affection in his chest.  His subjective units of disturbance is a 5.  As the client processed he saw that he use this idealized version of his ex-wife as a shield.   He originally sought to rehabilitate her reputation for the numerous affairs that she had in the hospital where she worked.  He stated he did it to protect his sons from the shame of her reputation.  His sons are now adults and do not need protecting.  As the client continued to process and realize how much she had betrayed him.  He began to feel some anger.  Ultimately he said, "I have to let her go."  That was his positive cognition at the end of the session.  His subjective units of distress was less than 1.  Interventions: Motivational Interviewing, Solution-Oriented/Positive Psychology, [employer] Desensitization and Reprocessing (EMDR) and Insight-Oriented  Diagnosis:   ICD-10-CM   1. Adjustment disorder with depressed mood  F43.21     Plan: Positive self talk, self-care, radical acceptance, boundaries, assertiveness, mood independent behavior.  D'Arcy Abraha, [facility]

## 2020-11-11 ENCOUNTER — Other Ambulatory Visit: Payer: Self-pay | Admitting: Family Medicine

## 2020-11-11 NOTE — Telephone Encounter (Signed)
Requested medication (s) are due for refill today: yes  Requested medication (s) are on the active medication list:yes  Last refill: 10/13/2020  Future visit scheduled: yes  Notes to clinic:  this refill cannot be delegated    Requested Prescriptions  Pending Prescriptions Disp Refills   zolpidem (AMBIEN CR) 12.5 MG CR tablet [Pharmacy Med Name: ZOLPIDEM TARTRATE ER 12.5 MG TAB] 30 tablet     Sig: TAKE ONE TABLET BY MOUTH AT BEDTIME AS NEEDED FOR SLEEP. (STOP ZOLPIDEM 10 MG)      Not Delegated - Psychiatry:  Anxiolytics/Hypnotics Failed - 11/11/2020  2:00 PM      Failed - This refill cannot be delegated      Failed - Urine Drug Screen completed in last 360 days      Passed - Valid encounter within last 6 months    Recent Outpatient Visits           2 months ago [facility] Jerrol Banana., MD   6 months ago Annual physical exam   [facility] Jerrol Banana., MD   9 months ago Hypogonadism in male   [facility] Rosanna Randy, Retia Passe., MD   11 months ago Lumbar radiculopathy   [facility] Jerrol Banana., MD   1 year ago Neuropathy   [facility] Jerrol Banana., MD       Future Appointments             In 1 week Jerrol Banana., MD [facility] [place]   In 3 months Ralene Bathe, MD [place]

## 2020-11-12 ENCOUNTER — Other Ambulatory Visit: Payer: Self-pay

## 2020-11-12 ENCOUNTER — Encounter: Payer: Self-pay | Admitting: Psychiatry

## 2020-11-12 ENCOUNTER — Ambulatory Visit (INDEPENDENT_AMBULATORY_CARE_PROVIDER_SITE_OTHER): Payer: 59 | Admitting: Psychiatry

## 2020-11-12 DIAGNOSIS — F4321 Adjustment disorder with depressed mood: Secondary | ICD-10-CM

## 2020-11-12 NOTE — Progress Notes (Signed)
      [facility] Counselor/Therapist Progress Note  Patient ID: Shawn Crawford, MRN: [id number],    Date: 11/12/2020  Time Spent: 50 minutes   Treatment Type: Individual Therapy  Reported Symptoms: decreased sleep, sad  Mental Status Exam:  Appearance:   Well Groomed     Behavior:  Appropriate  Motor:  Normal  Speech/Language:   Clear and Coherent  Affect:  Appropriate  Mood:  sad  Thought process:  normal  Thought content:    WNL  Sensory/Perceptual disturbances:    WNL  Orientation:  oriented to person, place, time/date and situation  Attention:  Good  Concentration:  Good  Memory:  WNL  Fund of knowledge:   Good  Insight:    Good  Judgment:   Good  Impulse Control:  Good   Risk Assessment: Danger to Self:  No Self-injurious Behavior: No Danger to Others: No Duty to Warn:no Physical Aggression / Violence:No  Access to Firearms a concern: No  Gang Involvement:No   Subjective: Client states that he is surprised that he has not thought about his ex-wife since our last session.  When she does come up he does not think about her like he used to.  He is neutral.  He sees this as a very positive outcome.  Before he would think about her, he would have sadness.  He knows this is not a placebo effect. Things are continuing to progress at his work.  All the paper work is in place for him to sign so that he Maevyn Riordan retire [employer] 1.  He states he still has some periodic pain in his back and legs.  He does think it is stress related.  Today his subjective units of distress is a 2.  His doctor has prescribed Belbuca for the pain from which he feels some relief.  We discussed using l-theanine at bedtime to see if it would help him go to sleep easier.  This is the main issue that he has struggled with recently. Most recently he met with a friend who wants to introduce him to his single cousin.  "She is pretty and petite.  I will call her and have coffee and see how it goes."  Overall the client  feels like he has met his major goals.  He agrees to follow-up one more time or on an as-needed basis.  Interventions: Assertiveness/Communication, Motivational Interviewing, Solution-Oriented/Positive Psychology and Insight-Oriented  Diagnosis:   ICD-10-CM   1. Adjustment disorder with depressed mood  F43.21     Plan: Sleep hygiene, mood independent behavior, positive self talk, self-care, follow-up with coffee with his friends cousin, continue process towards retiring, assertiveness, boundaries.  Delise Simenson, [facility]

## 2020-11-21 ENCOUNTER — Ambulatory Visit: Payer: 59 | Admitting: Family Medicine

## 2020-11-21 ENCOUNTER — Encounter: Payer: Self-pay | Admitting: Family Medicine

## 2020-11-21 ENCOUNTER — Other Ambulatory Visit: Payer: Self-pay

## 2020-11-21 VITALS — BP 138/75 | HR 89 | Temp 99.0°F | Resp 16 | Ht 71.0 in | Wt 168.0 lb

## 2020-11-21 DIAGNOSIS — E291 Testicular hypofunction: Secondary | ICD-10-CM | POA: Diagnosis not present

## 2020-11-21 DIAGNOSIS — N529 Male erectile dysfunction, unspecified: Secondary | ICD-10-CM

## 2020-11-21 DIAGNOSIS — I2510 Atherosclerotic heart disease of native coronary artery without angina pectoris: Secondary | ICD-10-CM

## 2020-11-21 DIAGNOSIS — M5416 Radiculopathy, lumbar region: Secondary | ICD-10-CM

## 2020-11-21 MED ORDER — TADALAFIL 5 MG PO TABS: 5.0000 mg | ORAL_TABLET | Freq: Every day | ORAL | 0 refills | Status: DC

## 2020-11-21 MED ORDER — TADALAFIL 5 MG PO TABS
5.0000 mg | ORAL_TABLET | Freq: Every day | ORAL | 3 refills | Status: DC
Start: 2020-11-21 — End: 2022-01-24

## 2020-11-21 NOTE — Progress Notes (Signed)
Established patient visit   Patient: Shawn Crawford   DOB: [birth year]   60 y.o. Male  MRN: [id number] Visit Date: 11/21/2020  Today's healthcare provider: [name], MD   Chief Complaint  Patient presents with  . Follow-up   Subjective    HPI  She comes in today for follow-up of hypogonadism on testosterone injections.  He is starting to feel better with this.  Does have complaint of ED and he is in a new relationship.  His heart disease is well controlled and had no angina symptoms.  He wishes to try an ED medicine. Follow up for low testosterone   The patient was last seen for this 2 years ago. Changes made at last visit include starting testosterone injections.  He reports good compliance with treatment. He feels that condition is Improved. He is having side effects.       Medications: Outpatient Medications Prior to Visit  Medication Sig  . amLODipine (NORVASC) 5 MG tablet TAKE 1 TABLET BY MOUTH  DAILY  . aspirin EC 81 MG tablet Take 81 mg by mouth daily.  . Buprenorphine HCl (BELBUCA) 600 MCG FILM Place 1 Film inside cheek every 12 (twelve) hours as needed. For chronic pain syndrome  . desonide (DESOWEN) 0.05 % ointment Apply topically 3 (three) times a week. Apply topically on Mondays, Wednesdays, and Fridays.  . clomiPHENE (CLOMID) 50 MG tablet Take 0.5 tablets (25 mg total) by mouth daily. (Patient not taking: Reported on 11/21/2020)  . ketoconazole (NIZORAL) 2 % cream Apply topically on Tuesdays, Thursdays, and Saturdays.  . MULTIPLE VITAMINS PO Take 1 tablet by mouth daily.   . rosuvastatin (CRESTOR) 40 MG tablet TAKE 1 TABLET BY MOUTH  DAILY  . testosterone cypionate (DEPOTESTOSTERONE CYPIONATE) 200 MG/ML injection Inject 0.5 mLs (100 mg total) into the muscle every 14 (fourteen) days. Inject 5 cc IM every 2 weeks  . traMADol (ULTRAM) 50 MG tablet Take 1 tablet (50 mg total) by mouth every 8 (eight) hours as needed for severe pain. Month last 30 days.   Mar[facility] zolpidem (AMBIEN CR) 12.5 MG CR tablet TAKE ONE TABLET BY MOUTH AT BEDTIME AS NEEDED FOR SLEEP. (STOP ZOLPIDEM 10 MG)   No facility-administered medications prior to visit.    Review of Systems      Objective    BP 138/75   Pulse 89   Temp 99 F (37.2 C)   Resp 16   Ht 5\' 11"  (1.803 m)   Wt 168 lb (76.2 kg)   BMI 23.43 kg/m  BP Readings from Last 3 Encounters:  11/21/20 138/75  08/29/20 123/73  07/24/20 120/77   Wt Readings from Last 3 Encounters:  11/21/20 168 lb (76.2 kg)  08/29/20 170 lb (77.1 kg)  07/24/20 161 lb (73 kg)       Physical Exam Vitals reviewed.  Constitutional:      Appearance: Normal appearance.  HENT:     Head: Normocephalic and atraumatic.     Right Ear: Tympanic membrane normal.     Left Ear: Tympanic membrane normal.     Mouth/Throat:     Mouth: Mucous membranes are moist.     Pharynx: Oropharynx is clear.  Eyes:     General: No scleral icterus.    Conjunctiva/sclera: Conjunctivae normal.  Neck:     Vascular: No carotid bruit.  Cardiovascular:     Rate and Rhythm: Normal rate and regular rhythm.     Pulses: Normal pulses.  Heart sounds: Normal heart sounds.  Pulmonary:     Effort: Pulmonary effort is normal.     Breath sounds: Normal breath sounds.  Abdominal:     General: Bowel sounds are normal.  Genitourinary:    Penis: Normal.      Testes: Normal.  Musculoskeletal:     Cervical back: Normal range of motion.  Skin:    General: Skin is warm and dry.  Neurological:     Mental Status: He is alert and oriented to person, place, and time. Mental status is at baseline.  Psychiatric:        Mood and Affect: Mood normal.        Behavior: Behavior normal.        Thought Content: Thought content normal.        Judgment: Judgment normal.       No results found for any visits on 11/21/20.  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 - tadalafil (CIALIS) 5 MG tablet; Take  1 tablet (5 mg total) by mouth daily.  Dispense: 90 tablet; Refill: 3  2. Arteriosclerosis of coronary artery   3. Lumbar radiculopathy   4. Hypogonadism in male Check testosterone level on next visit in 3 months.  Intractable PSA with testosterone therapy   No follow-ups on file.      I, [name], MD, have reviewed all documentation for this visit. The documentation on 11/25/20 for the exam, diagnosis, procedures, and orders are all accurate and complete.    Richard Cranford Mon, MD  [facility] [phone] (phone) [phone] (fax)  [place]

## 2020-11-22 ENCOUNTER — Encounter: Payer: 59 | Admitting: Student in an Organized Health Care Education/Training Program

## 2020-11-26 ENCOUNTER — Other Ambulatory Visit: Payer: Self-pay

## 2020-11-26 ENCOUNTER — Encounter: Payer: Self-pay | Admitting: Student in an Organized Health Care Education/Training Program

## 2020-11-26 ENCOUNTER — Ambulatory Visit
Payer: 59 | Attending: Student in an Organized Health Care Education/Training Program | Admitting: Student in an Organized Health Care Education/Training Program

## 2020-11-26 DIAGNOSIS — G894 Chronic pain syndrome: Secondary | ICD-10-CM

## 2020-11-26 MED ORDER — BELBUCA 600 MCG BU FILM: 1.0000 | ORAL_FILM | Freq: Two times a day (BID) | BUCCAL | 2 refills | Status: DC | PRN

## 2020-11-26 MED ORDER — TRAMADOL HCL 50 MG PO TABS: 50.0000 mg | ORAL_TABLET | Freq: Three times a day (TID) | ORAL | 2 refills | Status: DC | PRN

## 2020-11-26 NOTE — Progress Notes (Signed)
Patient: Shawn Crawford  Service Category: E/M  Provider: Gillis Santa, MD  DOB: 11-16-1960  DOS: 11/26/2020  Location: Office  MRN: [id number]  Setting: Ambulatory outpatient  Referring Provider: Jerrol Banana.,*  Type: Established Patient  Specialty: Interventional Pain Management  PCP: Jerrol Banana., MD  Location: Home  Delivery: TeleHealth     Virtual Encounter - Pain Management PROVIDER NOTE: Information contained herein reflects review and annotations entered in association with encounter. Interpretation of such information and data should be left to medically-trained personnel. Information provided to patient can be located elsewhere in the medical record under "Patient Instructions". Document created using STT-dictation technology, any transcriptional errors that may result from process are unintentional.    Contact & Pharmacy Preferred: [phone] Home: [phone] (home) Mobile: [phone] (mobile) E-mail: [email or web address]  [facility] [place], [place] [address] Phone: [phone] Fax: [phone]  [place], [place], [address] [place], [place] [address] [place] [address] Phone: [phone] Fax: [place], [place] [address] [place] [address] Phone: [phone] Fax: [phone]   Pre-screening  Mr. Masur offered "in-person" vs "virtual" encounter. He indicated preferring virtual for this encounter.   Reason COVID-19*  Social distancing based on CDC and AMA recommendations.   I contacted [place] on 11/26/2020 via video conference.      I clearly identified myself as Gillis Santa, MD. I verified that I was speaking with the correct person using two identifiers (Name: JEDEDIAH NODA, and date of birth: 1961-02-22).  Consent I sought verbal advanced consent from  [place] for virtual visit interactions. I informed Mr. Cropper of possible security and privacy concerns, risks, and limitations associated with providing "not-in-person" medical evaluation and management services. I also informed Mr. Gripp of the availability of "in-person" appointments. Finally, I informed him that there would be a charge for the virtual visit and that he could be  personally, fully or partially, financially responsible for it. Mr. Monforte expressed understanding and agreed to proceed.   Historic Elements   Mr. KAEDIN HICKLIN is a 60 y.o. year old, male patient evaluated today after our last contact on 07/24/2020. Mr. Mcadory  has a past medical history of Actinic keratosis, Dysplastic nevus (09/06/2014), Dysplastic nevus (07/09/2015), cardiac catheterization, Hyperlipidemia, and Hypertension. He also  has a past surgical history that includes Coronary artery bypass graft; Hernia repair; Appendectomy; Cardiac catheterization; Colonoscopy with propofol (N/A, 06/24/2018); Esophagogastroduodenoscopy (egd) with propofol (N/A, 06/24/2018); and Exam under anesthesia with hemorrhoidectomy (N/A, 11/11/2018). Mr. Bearman has a current medication list which includes the following prescription(s): amlodipine, aspirin ec, belbuca, desonide, ketoconazole, multiple vitamin, rosuvastatin, tadalafil, testosterone cypionate, tramadol, and zolpidem. He  reports that he has never smoked. He has never used smokeless tobacco. He reports current alcohol use. He reports that he does not use drugs. Mr. Raisanen is allergic to penicillins.   HPI  Today, he is being contacted for medication management.   No change in medical history since last visit.  Patient's pain is at baseline.  Patient continues multimodal pain regimen as prescribed.  States that it provides pain relief and improvement in functional status.   Pharmacotherapy Assessment  Analgesic: 11/12/2020  07/24/2020   1  Tramadol Hcl 50 Mg Tablet  90.00  30  Bi Lat   [id number]  Thr (  4878)  3/3  15.00 MME  Comm Ins  [place]    11/08/2020  09/13/2020   1  Testosterone Cyp 200 Mg/ml  1.00  28  Ri Gil  [id number]  Thr (4878)  2/2   Comm Ins  [place]    11/08/2020  10/09/2020   1  Belbuca 600 Mcg Film  60.00  30  Bi Lat  [id number]  Thr (4878)  1/1  1.20 mg  Comm Ins  [place]      Monitoring: [place] PMP: PDMP reviewed during this encounter.       Pharmacotherapy: No side-effects or adverse reactions reported. Compliance: No problems identified. Effectiveness: Clinically acceptable. Plan: Refer to "POC".  UDS:  Summary  Date Value Ref Range Status  07/24/2020 Note  Final    Comment:    ==================================================================== [employer] Select 13 (MW) ==================================================================== Test                             Result       Flag       Units  Drug Present and Declared for Prescription Verification   Buprenorphine                  33           EXPECTED   ng/mg creat   Norbuprenorphine               107          EXPECTED   ng/mg creat    Source of buprenorphine is a scheduled prescription medication.    Norbuprenorphine is an expected metabolite of buprenorphine.    Tramadol                       >2347        EXPECTED   ng/mg creat   O-Desmethyltramadol            >2347        EXPECTED   ng/mg creat   N-Desmethyltramadol            1743         EXPECTED   ng/mg creat    Source of tramadol is a prescription medication. O-desmethyltramadol    and N-desmethyltramadol are expected metabolites of tramadol.  ==================================================================== Test                      Result    Flag   Units      Ref Range   Creatinine              213              mg/dL      >=20 ==================================================================== Declared Medications:  The flagging and interpretation on this report are based on the  following declared medications.  Unexpected results may  arise from  inaccuracies in the declared medications.   **Note: The testing scope of this panel includes these medications:   Tramadol (Ultram)   **Note: The testing scope of this panel does not include small to  moderate amounts of these reported medications:   Buprenorphine (Belbuca)   **Note: The testing scope of this panel does not include the  following reported medications:   Amlodipine (Norvasc)  Aspirin  Clomiphene  Desonide (Desowen)  Ketoconazole (Nizoral)  Rosuvastatin (Crestor)  Zolpidem (Ambien) ==================================================================== For clinical consultation, please call [phone]. ====================================================================  Laboratory Chemistry Profile   Renal Lab Results  Component Value Date   BUN 13 04/18/2020   CREATININE 0.99 04/18/2020   BCR 13 04/18/2020   GFRAA 96 04/18/2020   GFRNONAA 83 04/18/2020     Hepatic Lab Results  Component Value Date   AST 23 04/18/2020   ALT 16 04/18/2020   ALBUMIN 4.8 04/18/2020   ALKPHOS 77 04/18/2020     Electrolytes Lab Results  Component Value Date   NA 139 04/18/2020   K 5.0 04/18/2020   CL 100 04/18/2020   CALCIUM 9.6 04/18/2020     Bone Lab Results  Component Value Date   TESTOFREE 5.1 (L) 11/09/2019   TESTOSTERONE 456 08/30/2020     Inflammation (CRP: Acute Phase) (ESR: Chronic Phase) Lab Results  Component Value Date   ESRSEDRATE 2 08/06/2018       Note: Above Lab results reviewed.  Imaging  CT Abdomen Pelvis W Contrast CLINICAL DATA:  60 year old male with a history of unexplained weight loss  EXAM: CT CHEST, ABDOMEN, AND PELVIS WITH CONTRAST  TECHNIQUE: Multidetector CT imaging of the chest, abdomen and pelvis was performed following the standard protocol during bolus administration of intravenous contrast.  CONTRAST:  120mL OMNIPAQUE IOHEXOL 300 MG/ML  SOLN  COMPARISON:  None.  FINDINGS: CT CHEST  FINDINGS  Cardiovascular:  Heart:  Surgical changes of median sternotomy and CABG. Native coronary calcifications  Aorta:  Unremarkable course, caliber, contour of the thoracic aorta. No aneurysm or dissection flap. No periaortic fluid. Calcifications of the aortic arch. Branch vessels are patent. Descending thoracic aorta with mild atherosclerotic changes.  Pulmonary arteries:  Study is not tailored for the most sensitive evaluation of the pulmonary arteries, however, no central filling defects identified.  Mediastinum/Nodes: Small lymph nodes of the mediastinum. Calcified lymph nodes in the subcarinal and left hilar nodal stations. Unremarkable appearance of the thoracic esophagus.  Unremarkable appearance of the thoracic inlet.  Lungs/Pleura: Central airways are clear. No pleural effusion. No confluent airspace disease.  No pneumothorax.  Musculoskeletal: No acute displaced fracture. Degenerative changes of the spine.  Review of the MIP images confirms the above findings.  CT ABDOMEN PELVIS FINDINGS  Hepatobiliary: Unremarkable appearance of liver. Unremarkable gallbladder  Pancreas: Unremarkable pancreas  Spleen: Punctate calcifications throughout the spleen, compatible with prior granulomatous disease.  Adrenals/Urinary Tract: Unremarkable appearance of the adrenal glands. No evidence of hydronephrosis of the right or left kidney. No nephrolithiasis. Unremarkable course of the bilateral ureters. Unremarkable appearance of the urinary bladder.  Stomach/Bowel: Unremarkable stomach. Unremarkable small bowel. Appendectomy. Moderate stool burden. Colonic diverticular disease without evidence of acute inflammation.  Vascular/Lymphatic: Atherosclerotic changes of the abdominal aorta. Mesenteric arteries and bilateral renal arteries are patent. Bilateral iliac arteries and proximal femoral arteries patent.  Reproductive: Diameter of the prostate measures 4.4  cm  Other: None  Musculoskeletal: No acute displaced fracture.  Degenerative changes.  IMPRESSION: No acute CT finding.  No finding to account for weight loss.  Surgical changes of median sternotomy and CABG.  Aortic Atherosclerosis (ICD10-I70.0).  Diverticular disease without evidence of acute diverticulitis.  Electronically Signed   By: Corrie Mckusick D.O.   On: 05/06/2018 09:57 CT Chest W Contrast CLINICAL DATA:  60 year old male with a history of unexplained weight loss  EXAM: CT CHEST, ABDOMEN, AND PELVIS WITH CONTRAST  TECHNIQUE: Multidetector CT imaging of the chest, abdomen and pelvis was performed following the standard protocol during bolus administration of intravenous contrast.  CONTRAST:  149mL OMNIPAQUE IOHEXOL 300 MG/ML  SOLN  COMPARISON:  None.  FINDINGS: CT CHEST FINDINGS  Cardiovascular:  Heart:  Surgical changes of median sternotomy and CABG. Native coronary calcifications  Aorta:  Unremarkable course, caliber, contour of the thoracic aorta. No aneurysm or dissection flap. No periaortic fluid. Calcifications of the aortic arch. Branch vessels are patent. Descending thoracic aorta with mild atherosclerotic changes.  Pulmonary arteries:  Study is not tailored for the most sensitive evaluation of the pulmonary arteries, however, no central filling defects identified.  Mediastinum/Nodes: Small lymph nodes of the mediastinum. Calcified lymph nodes in the subcarinal and left hilar nodal stations. Unremarkable appearance of the thoracic esophagus.  Unremarkable appearance of the thoracic inlet.  Lungs/Pleura: Central airways are clear. No pleural effusion. No confluent airspace disease.  No pneumothorax.  Musculoskeletal: No acute displaced fracture. Degenerative changes of the spine.  Review of the MIP images confirms the above findings.  CT ABDOMEN PELVIS FINDINGS  Hepatobiliary: Unremarkable appearance of liver.  Unremarkable gallbladder  Pancreas: Unremarkable pancreas  Spleen: Punctate calcifications throughout the spleen, compatible with prior granulomatous disease.  Adrenals/Urinary Tract: Unremarkable appearance of the adrenal glands. No evidence of hydronephrosis of the right or left kidney. No nephrolithiasis. Unremarkable course of the bilateral ureters. Unremarkable appearance of the urinary bladder.  Stomach/Bowel: Unremarkable stomach. Unremarkable small bowel. Appendectomy. Moderate stool burden. Colonic diverticular disease without evidence of acute inflammation.  Vascular/Lymphatic: Atherosclerotic changes of the abdominal aorta. Mesenteric arteries and bilateral renal arteries are patent. Bilateral iliac arteries and proximal femoral arteries patent.  Reproductive: Diameter of the prostate measures 4.4 cm  Other: None  Musculoskeletal: No acute displaced fracture.  Degenerative changes.  IMPRESSION: No acute CT finding.  No finding to account for weight loss.  Surgical changes of median sternotomy and CABG.  Aortic Atherosclerosis (ICD10-I70.0).  Diverticular disease without evidence of acute diverticulitis.  Electronically Signed   By: Corrie Mckusick D.O.   On: 05/06/2018 09:57  Assessment  The encounter diagnosis was Chronic pain syndrome.  Plan of Care  Problem-specific:  No problem-specific Assessment & Plan notes found for this encounter.  Mr. ANIK WESCH has a current medication list which includes the following long-term medication(s): amlodipine, rosuvastatin, tadalafil, testosterone cypionate, tramadol, and zolpidem.  Pharmacotherapy (Medications Ordered): Meds ordered this encounter  Medications  . Buprenorphine HCl (BELBUCA) 600 MCG FILM    Sig: Place 1 Film inside cheek every 12 (twelve) hours as needed. For chronic pain syndrome    Dispense:  60 each    Refill:  2  . traMADol (ULTRAM) 50 MG tablet    Sig: Take 1 tablet (50 mg total) by  mouth every 8 (eight) hours as needed for severe pain. Month last 30 days.    Dispense:  90 tablet    Refill:  2    [place] STOP ACT - Not applicable. Fill one day early if pharmacy is closed on scheduled refill date. .   Follow-up plan:   Return in about 3 months (around 03/07/2021) for Medication Management, in person.   Recent Visits Date Type Provider Dept  10/09/20 Telemedicine Gillis Santa, MD [facility]  Showing recent visits within past 90 days and meeting all other requirements Today's Visits Date Type Provider Dept  11/26/20 Telemedicine Gillis Santa, MD [facility]  Showing today's visits and meeting all other requirements Future Appointments No visits were found meeting these conditions. Showing future appointments within next 90 days and meeting all other requirements  I discussed the assessment and treatment plan with the  patient. The patient was provided an opportunity to ask questions and all were answered. The patient agreed with the plan and demonstrated an understanding of the instructions.  Patient advised to call back or seek an in-person evaluation if the symptoms or condition worsens.  Duration of encounter:24minutes.  Note by: Gillis Santa, MD Date: 11/26/2020; Time: 2:28 PM

## 2020-12-13 ENCOUNTER — Other Ambulatory Visit: Payer: Self-pay

## 2020-12-13 ENCOUNTER — Telehealth: Payer: Self-pay

## 2020-12-13 NOTE — Telephone Encounter (Signed)
[facility] faxed refill request for the following medications:  zolpidem (AMBIEN CR) 12.5 MG CR tablet  testosterone cypionate (DEPOTESTOSTERONE CYPIONATE) 200 MG/ML injection   Please advise.  Thanks, [employer]

## 2020-12-14 MED ORDER — ZOLPIDEM TARTRATE ER 12.5 MG PO TBCR: EXTENDED_RELEASE_TABLET | ORAL | 4 refills | Status: DC

## 2020-12-14 MED ORDER — TESTOSTERONE CYPIONATE 200 MG/ML IM SOLN: 100.0000 mg | INTRAMUSCULAR | 2 refills | Status: DC

## 2020-12-19 ENCOUNTER — Ambulatory Visit (INDEPENDENT_AMBULATORY_CARE_PROVIDER_SITE_OTHER): Payer: 59 | Admitting: Psychiatry

## 2020-12-19 ENCOUNTER — Other Ambulatory Visit: Payer: Self-pay

## 2020-12-19 ENCOUNTER — Encounter: Payer: Self-pay | Admitting: Psychiatry

## 2020-12-19 DIAGNOSIS — F4322 Adjustment disorder with anxiety: Secondary | ICD-10-CM

## 2020-12-19 NOTE — Progress Notes (Signed)
      [facility] Counselor/Therapist Progress Note  Patient ID: Shawn Crawford, MRN: [id number],    Date: 12/19/2020  Time Spent: 50 minutes   Treatment Type: Individual Therapy  Reported Symptoms: anxiety  Mental Status Exam:  Appearance:   Casual and Well Groomed     Behavior:  Appropriate  Motor:  Normal  Speech/Language:   Clear and Coherent  Affect:  Appropriate  Mood:  anxious  Thought process:  normal  Thought content:    WNL  Sensory/Perceptual disturbances:    WNL  Orientation:  oriented to person, place, time/date and situation  Attention:  Good  Concentration:  Good  Memory:  WNL  Fund of knowledge:   Good  Insight:    Good  Judgment:   Good  Impulse Control:  Good   Risk Assessment: Danger to Self:  No Self-injurious Behavior: No Danger to Others: No Duty to Warn:no Physical Aggression / Violence:No  Access to Firearms a concern: No  Gang Involvement:No   Subjective: The client was set up by a friend with a woman.  He states they have connected well and he is very romantically excited about her.  "I have not felt this way since college."  He states they see each other almost every day and communicate well to each other.  The client is concerned is that the woman is a Mormon.  She was raised in the [place] family but then moved to [place] and married someone who was Mormon.  The client is a [place] and this is a major issue for him.  "She wants to come back to the faith but I do want to hurt her by approaching it."  Today I used eye-movement focusing on this woman.  His negative cognition is, "if I asked her about her faith it will hurt her."  He feels uncertainty in his chest.  His subjective units of distress is a 4.  As the client processed we discussed ways that he could approach this without being offensive.  I suggested to the client that he asked her about her journey from Christianity into Mormonism.  The client thought this would be a nondefensive way of  approaching the subject.  He felt he could do that.  Interventions: Assertiveness/Communication, Motivational Interviewing, Solution-Oriented/Positive Psychology, [facility] Desensitization and Reprocessing (EMDR) and Insight-Oriented  Diagnosis:   ICD-10-CM   1. Adjustment disorder with anxiety  F43.22     Plan: Mood independent behavior, positive self talk, self-care, assertiveness, boundaries, nonjudgmental stance.  Donold Marotto, [facility]

## 2021-01-10 ENCOUNTER — Ambulatory Visit: Payer: 59 | Admitting: Psychiatry

## 2021-01-21 ENCOUNTER — Other Ambulatory Visit: Payer: Self-pay

## 2021-01-21 ENCOUNTER — Ambulatory Visit: Payer: 59 | Admitting: Family Medicine

## 2021-01-21 ENCOUNTER — Encounter: Payer: Self-pay | Admitting: Family Medicine

## 2021-01-21 VITALS — Temp 98.1°F | Resp 16 | Ht 71.0 in | Wt 164.0 lb

## 2021-01-21 DIAGNOSIS — R7989 Other specified abnormal findings of blood chemistry: Secondary | ICD-10-CM

## 2021-01-21 DIAGNOSIS — I10 Essential (primary) hypertension: Secondary | ICD-10-CM | POA: Diagnosis not present

## 2021-01-21 DIAGNOSIS — I2510 Atherosclerotic heart disease of native coronary artery without angina pectoris: Secondary | ICD-10-CM

## 2021-01-21 DIAGNOSIS — M5136 Other intervertebral disc degeneration, lumbar region: Secondary | ICD-10-CM | POA: Diagnosis not present

## 2021-01-21 DIAGNOSIS — E7800 Pure hypercholesterolemia, unspecified: Secondary | ICD-10-CM

## 2021-01-21 DIAGNOSIS — M51369 Other intervertebral disc degeneration, lumbar region without mention of lumbar back pain or lower extremity pain: Secondary | ICD-10-CM

## 2021-01-21 NOTE — Progress Notes (Signed)
I,April Miller,acting as a scribe for [name], MD.,have documented all relevant documentation on the behalf of [name], MD,as directed by  [name], MD while in the presence of [name], MD.   Established patient visit   Patient: Shawn Crawford   DOB: [birth year]   61 y.o. Male  MRN: [id number] Visit Date: 01/21/2021  Today's healthcare provider: [name], MD   Chief Complaint  Patient presents with  . Follow-up   Subjective    HPI  Patient feels a good bit better since starting on testosterone injections.  His back is even feeling better with the back pain has been chronic.  He is in process of completely retiring from work. ED (erectile dysfunction) of organic origin From 11/21/2020-Try Cialis 5 mg daily.  Hypogonadism in male From 11/21/2020-Check testosterone level on next visit in 3 months.  Intractable PSA with testosterone therapy.      Medications: Outpatient Medications Prior to Visit  Medication Sig  . amLODipine (NORVASC) 5 MG tablet TAKE 1 TABLET BY MOUTH  DAILY  . aspirin EC 81 MG tablet Take 81 mg by mouth daily.  . Buprenorphine HCl (BELBUCA) 600 MCG FILM Place 1 Film inside cheek every 12 (twelve) hours as needed. For chronic pain syndrome  . desonide (DESOWEN) 0.05 % ointment Apply topically 3 (three) times a week. Apply topically on Mondays, Wednesdays, and Fridays.  Marland Kitchen ketoconazole (NIZORAL) 2 % cream Apply topically on Tuesdays, Thursdays, and Saturdays.  . MULTIPLE VITAMINS PO Take 1 tablet by mouth daily.   . rosuvastatin (CRESTOR) 40 MG tablet TAKE 1 TABLET BY MOUTH  DAILY  . tadalafil (CIALIS) 5 MG tablet Take 1 tablet (5 mg total) by mouth daily.  Marland Kitchen testosterone cypionate (DEPOTESTOSTERONE CYPIONATE) 200 MG/ML injection Inject 0.5 mLs (100 mg total) into the muscle every 14 (fourteen) days. Inject 5 cc IM every 2 weeks  . traMADol (ULTRAM) 50 MG tablet Take 1 tablet (50 mg total) by mouth every 8  (eight) hours as needed for severe pain. Month last 30 days.  Marland Kitchen zolpidem (AMBIEN CR) 12.5 MG CR tablet TAKE ONE TABLET BY MOUTH AT BEDTIME AS NEEDED FOR SLEEP. (STOP ZOLPIDEM 10 MG)   No facility-administered medications prior to visit.    Review of Systems  Constitutional: Negative for appetite change, chills and fever.  Respiratory: Negative for chest tightness, shortness of breath and wheezing.   Cardiovascular: Negative for chest pain and palpitations.  Gastrointestinal: Negative for abdominal pain, nausea and vomiting.        Objective    Temp 98.1 F (36.7 C) (Oral)   Resp 16   Ht 5\' 11"  (1.803 m)   Wt 164 lb (74.4 kg)   SpO2 96%   BMI 22.87 kg/m  BP Readings from Last 3 Encounters:  11/21/20 138/75  08/29/20 123/73  07/24/20 120/77   Wt Readings from Last 3 Encounters:  01/21/21 164 lb (74.4 kg)  11/21/20 168 lb (76.2 kg)  08/29/20 170 lb (77.1 kg)       Physical Exam Vitals reviewed.  Constitutional:      Appearance: Normal appearance.  HENT:     Head: Normocephalic and atraumatic.     Right Ear: Tympanic membrane normal.     Left Ear: Tympanic membrane normal.     Mouth/Throat:     Mouth: Mucous membranes are moist.     Pharynx: Oropharynx is clear.  Eyes:     General: No  scleral icterus.    Conjunctiva/sclera: Conjunctivae normal.  Neck:     Vascular: No carotid bruit.  Cardiovascular:     Rate and Rhythm: Normal rate and regular rhythm.     Pulses: Normal pulses.     Heart sounds: Normal heart sounds.  Pulmonary:     Effort: Pulmonary effort is normal.     Breath sounds: Normal breath sounds.  Abdominal:     General: Bowel sounds are normal.  Musculoskeletal:     Cervical back: Normal range of motion.     Right lower leg: No edema.     Left lower leg: No edema.  Skin:    General: Skin is warm and dry.  Neurological:     Mental Status: He is alert and oriented to person, place, and time. Mental status is at baseline.  Psychiatric:         Mood and Affect: Mood normal.        Behavior: Behavior normal.        Thought Content: Thought content normal.        Judgment: Judgment normal.       No results found for any visits on 01/21/21.  Assessment & Plan      1. Low testosterone Level and follow-up PSA.  See him back in the fall for a physical - PSA - Testosterone  2. Benign essential HTN Good control  3. Arteriosclerosis of coronary artery All risk factors treated.  CABG performed at age 14  4. Lumbar degenerative disc disease Followed by Dr. Zollie Scale  5. Pure hypercholesterolemia On statin   No follow-ups on file.      I, [name], MD, have reviewed all documentation for this visit. The documentation on 01/26/21 for the exam, diagnosis, procedures, and orders are all accurate and complete.    Gracey Tolle Cranford Mon, MD  [facility] [phone] (phone) [phone] (fax)  [place]

## 2021-01-23 LAB — TESTOSTERONE: Testosterone: 916 ng/dL (ref 264–916)

## 2021-01-23 LAB — PSA: Prostate Specific Ag, Serum: 1.4 ng/mL (ref 0.0–4.0)

## 2021-02-04 ENCOUNTER — Other Ambulatory Visit: Payer: Self-pay | Admitting: Student in an Organized Health Care Education/Training Program

## 2021-02-04 DIAGNOSIS — G894 Chronic pain syndrome: Secondary | ICD-10-CM

## 2021-02-06 ENCOUNTER — Other Ambulatory Visit: Payer: Self-pay | Admitting: Student in an Organized Health Care Education/Training Program

## 2021-02-06 DIAGNOSIS — G894 Chronic pain syndrome: Secondary | ICD-10-CM

## 2021-02-19 ENCOUNTER — Other Ambulatory Visit: Payer: Self-pay

## 2021-02-19 ENCOUNTER — Ambulatory Visit
Payer: 59 | Attending: Student in an Organized Health Care Education/Training Program | Admitting: Student in an Organized Health Care Education/Training Program

## 2021-02-19 ENCOUNTER — Encounter: Payer: Self-pay | Admitting: Student in an Organized Health Care Education/Training Program

## 2021-02-19 VITALS — BP 121/82 | HR 85 | Temp 97.3°F | Resp 16 | Ht 71.0 in | Wt 156.0 lb

## 2021-02-19 DIAGNOSIS — M5416 Radiculopathy, lumbar region: Secondary | ICD-10-CM | POA: Insufficient documentation

## 2021-02-19 DIAGNOSIS — M47816 Spondylosis without myelopathy or radiculopathy, lumbar region: Secondary | ICD-10-CM | POA: Diagnosis not present

## 2021-02-19 DIAGNOSIS — G894 Chronic pain syndrome: Secondary | ICD-10-CM | POA: Insufficient documentation

## 2021-02-19 DIAGNOSIS — M7918 Myalgia, other site: Secondary | ICD-10-CM | POA: Diagnosis not present

## 2021-02-19 MED ORDER — TRAMADOL HCL 50 MG PO TABS: 50.0000 mg | ORAL_TABLET | Freq: Three times a day (TID) | ORAL | 2 refills | Status: DC | PRN

## 2021-02-19 MED ORDER — BELBUCA 600 MCG BU FILM: 1.0000 | ORAL_FILM | Freq: Two times a day (BID) | BUCCAL | 2 refills | Status: DC | PRN

## 2021-02-19 NOTE — Progress Notes (Signed)
PROVIDER NOTE: Information contained herein reflects review and annotations entered in association with encounter. Interpretation of such information and data should be left to medically-trained personnel. Information provided to patient can be located elsewhere in the medical record under "Patient Instructions". Document created using STT-dictation technology, any transcriptional errors that may result from process are unintentional.    Patient: Shawn Crawford  Service Category: E/M  Provider: Gillis Santa, MD  DOB: October 10, 1960  DOS: 02/19/2021  Specialty: Interventional Pain Management  MRN: [id number]  Setting: Ambulatory outpatient  PCP: Jerrol Banana., MD  Type: Established Patient    Referring Provider: Jerrol Banana.,*  Location: Office  Delivery: Face-to-face     HPI  Mr. LEBRON NAUERT, a 60 y.o. year old male, is here today because of his Lumbar radiculopathy [M54.16]. Mr. Gilkes primary complain today is Back Pain (lower) Last encounter: My last encounter with him was on 02/06/2021. Pertinent problems: Mr. Bou has Arteriosclerosis of coronary artery; Lumbar radiculopathy; Benign essential HTN; Chronic pain syndrome; Chronic midline low back pain without sciatica; Lumbar facet arthropathy; Lumbar degenerative disc disease; and Piriformis muscle pain on their pertinent problem list. Pain Assessment: Severity of Chronic pain is reported as a 2 /10. Location: Back Lower/both legs to the knee. Onset: More than a month ago. Quality: Aching. Timing: Constant. Modifying factor(s): lying on side. Vitals:  height is $RemoveB'5\' 11"'SjRxChNc$  (1.803 m) and weight is 156 lb (70.8 kg). His temporal temperature is 97.3 F (36.3 C) (abnormal). His blood pressure is 121/82 and his pulse is 85. His respiration is 16 and oxygen saturation is 99%.   Reason for encounter: medication management.    Patient is doing well overall. No change in medical history since last visit.  Patient's pain is at baseline.  Patient  continues multimodal pain regimen as prescribed.  States that it provides pain relief and improvement in functional status. Is going out of town to [facility] for business, requesting to have his belbuca filled prior to him leaving next Thursday  Pharmacotherapy Assessment   Analgesic: Belbuca 600 mcg BID, Tramadol 50 mg q8 hrs breakthrough pain    Monitoring: [place] PMP: PDMP reviewed during this encounter.       Pharmacotherapy: No side-effects or adverse reactions reported. Compliance: No problems identified. Effectiveness: Clinically acceptable.  Landis Martins, RN  02/19/2021  8:14 AM  Sign when Signing Visit Nursing Pain Medication Assessment:  Safety precautions to be maintained throughout the outpatient stay will include: orient to surroundings, keep bed in low position, maintain call bell within reach at all times, provide assistance with transfer out of bed and ambulation.  Medication Inspection Compliance: Pill count conducted under aseptic conditions, in front of the patient. Neither the pills nor the bottle was removed from the patient's sight at any time. Once count was completed pills were immediately returned to the patient in their original bottle.  Medication #1: Tramadol (Ultram) Pill/Patch Count: 27 of 90 pills remain Pill/Patch Appearance: Markings consistent with prescribed medication Bottle Appearance: Standard pharmacy container. Clearly labeled. Filled Date: 02/06/2021 Last Medication intake:  Yesterday  Medication #2: Buprenorphine (Suboxone) Pill/Patch Count: 33 of 60 pills remain Pill/Patch Appearance: Markings consistent with prescribed medication Bottle Appearance: Standard pharmacy container. Clearly labeled. Filled Date: 02/04/2021 Last Medication intake:  Today    UDS:  Summary  Date Value Ref Range Status  07/24/2020 Note  Final    Comment:    ==================================================================== [employer] Select 13  (MW) ==================================================================== Test  Result       Flag       Units  Drug Present and Declared for Prescription Verification   Buprenorphine                  33           EXPECTED   ng/mg creat   Norbuprenorphine               107          EXPECTED   ng/mg creat    Source of buprenorphine is a scheduled prescription medication.    Norbuprenorphine is an expected metabolite of buprenorphine.    Tramadol                       >2347        EXPECTED   ng/mg creat   O-Desmethyltramadol            >2347        EXPECTED   ng/mg creat   N-Desmethyltramadol            1743         EXPECTED   ng/mg creat    Source of tramadol is a prescription medication. O-desmethyltramadol    and N-desmethyltramadol are expected metabolites of tramadol.  ==================================================================== Test                      Result    Flag   Units      Ref Range   Creatinine              213              mg/dL      >=20 ==================================================================== Declared Medications:  The flagging and interpretation on this report are based on the  following declared medications.  Unexpected results may arise from  inaccuracies in the declared medications.   **Note: The testing scope of this panel includes these medications:   Tramadol (Ultram)   **Note: The testing scope of this panel does not include small to  moderate amounts of these reported medications:   Buprenorphine (Belbuca)   **Note: The testing scope of this panel does not include the  following reported medications:   Amlodipine (Norvasc)  Aspirin  Clomiphene  Desonide (Desowen)  Ketoconazole (Nizoral)  Rosuvastatin (Crestor)  Zolpidem (Ambien) ==================================================================== For clinical consultation, please call [phone]. ====================================================================      ROS  Constitutional: Denies any fever or chills Gastrointestinal: No reported hemesis, hematochezia, vomiting, or acute GI distress Musculoskeletal: Denies any acute onset joint swelling, redness, loss of ROM, or weakness Neurological: No reported episodes of acute onset apraxia, aphasia, dysarthria, agnosia, amnesia, paralysis, loss of coordination, or loss of consciousness  Medication Review  Buprenorphine HCl, Multiple Vitamin, amLODipine, aspirin EC, desonide, ketoconazole, rosuvastatin, tadalafil, testosterone cypionate, traMADol, and zolpidem  History Review  Allergy: Mr. Wattenbarger is allergic to penicillins. Drug: Mr. Escandon  reports no history of drug use. Alcohol:  reports current alcohol use. Tobacco:  reports that he has never smoked. He has never used smokeless tobacco. Social: Mr. Grieshop  reports that he has never smoked. He has never used smokeless tobacco. He reports current alcohol use. He reports that he does not use drugs. Medical:  has a past medical history of Actinic keratosis, Dysplastic nevus (09/06/2014), Dysplastic nevus (07/09/2015), cardiac catheterization, Hyperlipidemia, and Hypertension. Surgical: Mr. Shaheen  has a past surgical history that includes  Coronary artery bypass graft; Hernia repair; Appendectomy; Cardiac catheterization; Colonoscopy with propofol (N/A, 06/24/2018); Esophagogastroduodenoscopy (egd) with propofol (N/A, 06/24/2018); and Exam under anesthesia with hemorrhoidectomy (N/A, 11/11/2018). Family: family history includes Colon cancer in his sister; Diabetes in his father; Hyperlipidemia in his mother; Hypertension in his father; Thyroid cancer in his sister.  Laboratory Chemistry Profile   Renal Lab Results  Component Value Date   BUN 13 04/18/2020   CREATININE 0.99 04/18/2020   BCR 13 04/18/2020   GFRAA 96 04/18/2020   GFRNONAA 83 04/18/2020     Hepatic Lab  Results  Component Value Date   AST 23 04/18/2020   ALT 16 04/18/2020   ALBUMIN 4.8 04/18/2020   ALKPHOS 77 04/18/2020     Electrolytes Lab Results  Component Value Date   NA 139 04/18/2020   K 5.0 04/18/2020   CL 100 04/18/2020   CALCIUM 9.6 04/18/2020     Bone Lab Results  Component Value Date   TESTOFREE 5.1 (L) 11/09/2019   TESTOSTERONE 916 01/22/2021     Inflammation (CRP: Acute Phase) (ESR: Chronic Phase) Lab Results  Component Value Date   ESRSEDRATE 2 08/06/2018       Note: Above Lab results reviewed.  Recent Imaging Review  CT Abdomen Pelvis W Contrast CLINICAL DATA:  60 year old male with a history of unexplained weight loss  EXAM: CT CHEST, ABDOMEN, AND PELVIS WITH CONTRAST  TECHNIQUE: Multidetector CT imaging of the chest, abdomen and pelvis was performed following the standard protocol during bolus administration of intravenous contrast.  CONTRAST:  126mL OMNIPAQUE IOHEXOL 300 MG/ML  SOLN  COMPARISON:  None.  FINDINGS: CT CHEST FINDINGS  Cardiovascular:  Heart:  Surgical changes of median sternotomy and CABG. Native coronary calcifications  Aorta:  Unremarkable course, caliber, contour of the thoracic aorta. No aneurysm or dissection flap. No periaortic fluid. Calcifications of the aortic arch. Branch vessels are patent. Descending thoracic aorta with mild atherosclerotic changes.  Pulmonary arteries:  Study is not tailored for the most sensitive evaluation of the pulmonary arteries, however, no central filling defects identified.  Mediastinum/Nodes: Small lymph nodes of the mediastinum. Calcified lymph nodes in the subcarinal and left hilar nodal stations. Unremarkable appearance of the thoracic esophagus.  Unremarkable appearance of the thoracic inlet.  Lungs/Pleura: Central airways are clear. No pleural effusion. No confluent airspace disease.  No pneumothorax.  Musculoskeletal: No acute displaced fracture. Degenerative  changes of the spine.  Review of the MIP images confirms the above findings.  CT ABDOMEN PELVIS FINDINGS  Hepatobiliary: Unremarkable appearance of liver. Unremarkable gallbladder  Pancreas: Unremarkable pancreas  Spleen: Punctate calcifications throughout the spleen, compatible with prior granulomatous disease.  Adrenals/Urinary Tract: Unremarkable appearance of the adrenal glands. No evidence of hydronephrosis of the right or left kidney. No nephrolithiasis. Unremarkable course of the bilateral ureters. Unremarkable appearance of the urinary bladder.  Stomach/Bowel: Unremarkable stomach. Unremarkable small bowel. Appendectomy. Moderate stool burden. Colonic diverticular disease without evidence of acute inflammation.  Vascular/Lymphatic: Atherosclerotic changes of the abdominal aorta. Mesenteric arteries and bilateral renal arteries are patent. Bilateral iliac arteries and proximal femoral arteries patent.  Reproductive: Diameter of the prostate measures 4.4 cm  Other: None  Musculoskeletal: No acute displaced fracture.  Degenerative changes.  IMPRESSION: No acute CT finding.  No finding to account for weight loss.  Surgical changes of median sternotomy and CABG.  Aortic Atherosclerosis (ICD10-I70.0).  Diverticular disease without evidence of acute diverticulitis.  Electronically Signed   By: Corrie Mckusick D.O.   On: 05/06/2018  09:57 CT Chest W Contrast CLINICAL DATA:  60 year old male with a history of unexplained weight loss  EXAM: CT CHEST, ABDOMEN, AND PELVIS WITH CONTRAST  TECHNIQUE: Multidetector CT imaging of the chest, abdomen and pelvis was performed following the standard protocol during bolus administration of intravenous contrast.  CONTRAST:  19mL OMNIPAQUE IOHEXOL 300 MG/ML  SOLN  COMPARISON:  None.  FINDINGS: CT CHEST FINDINGS  Cardiovascular:  Heart:  Surgical changes of median sternotomy and CABG. Native  coronary calcifications  Aorta:  Unremarkable course, caliber, contour of the thoracic aorta. No aneurysm or dissection flap. No periaortic fluid. Calcifications of the aortic arch. Branch vessels are patent. Descending thoracic aorta with mild atherosclerotic changes.  Pulmonary arteries:  Study is not tailored for the most sensitive evaluation of the pulmonary arteries, however, no central filling defects identified.  Mediastinum/Nodes: Small lymph nodes of the mediastinum. Calcified lymph nodes in the subcarinal and left hilar nodal stations. Unremarkable appearance of the thoracic esophagus.  Unremarkable appearance of the thoracic inlet.  Lungs/Pleura: Central airways are clear. No pleural effusion. No confluent airspace disease.  No pneumothorax.  Musculoskeletal: No acute displaced fracture. Degenerative changes of the spine.  Review of the MIP images confirms the above findings.  CT ABDOMEN PELVIS FINDINGS  Hepatobiliary: Unremarkable appearance of liver. Unremarkable gallbladder  Pancreas: Unremarkable pancreas  Spleen: Punctate calcifications throughout the spleen, compatible with prior granulomatous disease.  Adrenals/Urinary Tract: Unremarkable appearance of the adrenal glands. No evidence of hydronephrosis of the right or left kidney. No nephrolithiasis. Unremarkable course of the bilateral ureters. Unremarkable appearance of the urinary bladder.  Stomach/Bowel: Unremarkable stomach. Unremarkable small bowel. Appendectomy. Moderate stool burden. Colonic diverticular disease without evidence of acute inflammation.  Vascular/Lymphatic: Atherosclerotic changes of the abdominal aorta. Mesenteric arteries and bilateral renal arteries are patent. Bilateral iliac arteries and proximal femoral arteries patent.  Reproductive: Diameter of the prostate measures 4.4 cm  Other: None  Musculoskeletal: No acute displaced fracture.  Degenerative  changes.  IMPRESSION: No acute CT finding.  No finding to account for weight loss.  Surgical changes of median sternotomy and CABG.  Aortic Atherosclerosis (ICD10-I70.0).  Diverticular disease without evidence of acute diverticulitis.  Electronically Signed   By: Corrie Mckusick D.O.   On: 05/06/2018 09:57 Note: Reviewed        Physical Exam  General appearance: Well nourished, well developed, and well hydrated. In no apparent acute distress Mental status: Alert, oriented x 3 (person, place, & time)       Respiratory: No evidence of acute respiratory distress Eyes: PERLA Vitals: BP 121/82   Pulse 85   Temp (!) 97.3 F (36.3 C) (Temporal)   Resp 16   Ht $R'5\' 11"'cr$  (1.803 m)   Wt 156 lb (70.8 kg)   SpO2 99%   BMI 21.76 kg/m  BMI: Estimated body mass index is 21.76 kg/m as calculated from the following:   Height as of this encounter: $RemoveBeforeD'5\' 11"'OnnJxTBVPsRNXj$  (1.803 m).   Weight as of this encounter: 156 lb (70.8 kg). Ideal: Ideal body weight: 75.3 kg (166 lb 0.1 oz)  Assessment   Status Diagnosis  Controlled Controlled Controlled 1. Lumbar radiculopathy   2. Chronic pain syndrome   3. Lumbar facet arthropathy   4. Piriformis muscle pain       Plan of Care   Mr. GILBERT MANOLIS has a current medication list which includes the following long-term medication(s): amlodipine, rosuvastatin, tadalafil, testosterone cypionate, zolpidem, and [START ON 03/09/2021] tramadol.  Pharmacotherapy (Medications Ordered): Meds  ordered this encounter  Medications  . Buprenorphine HCl (BELBUCA) 600 MCG FILM    Sig: Place 1 Film inside cheek every 12 (twelve) hours as needed. For chronic pain syndrome    Dispense:  60 each    Refill:  2  . traMADol (ULTRAM) 50 MG tablet    Sig: Take 1 tablet (50 mg total) by mouth every 8 (eight) hours as needed for severe pain. Month last 30 days.    Dispense:  90 tablet    Refill:  2    [place] STOP ACT - Not applicable. Fill one day early if pharmacy is closed on  scheduled refill date. .    Follow-up plan:   Return in about 3 months (around 05/22/2021) for Medication Management, in person.   Recent Visits Date Type Provider Dept  11/26/20 Telemedicine Gillis Santa, MD [facility]  Showing recent visits within past 90 days and meeting all other requirements Today's Visits Date Type Provider Dept  02/19/21 Office Visit Gillis Santa, MD [facility]  Showing today's visits and meeting all other requirements Future Appointments No visits were found meeting these conditions. Showing future appointments within next 90 days and meeting all other requirements  I discussed the assessment and treatment plan with the patient. The patient was provided an opportunity to ask questions and all were answered. The patient agreed with the plan and demonstrated an understanding of the instructions.  Patient advised to call back or seek an in-person evaluation if the symptoms or condition worsens.  Duration of encounter: 30 minutes.  Note by: Gillis Santa, MD Date: 02/19/2021; Time: 8:34 AM

## 2021-02-19 NOTE — Progress Notes (Signed)
Nursing Pain Medication Assessment:  Safety precautions to be maintained throughout the outpatient stay will include: orient to surroundings, keep bed in low position, maintain call bell within reach at all times, provide assistance with transfer out of bed and ambulation.  Medication Inspection Compliance: Pill count conducted under aseptic conditions, in front of the patient. Neither the pills nor the bottle was removed from the patient's sight at any time. Once count was completed pills were immediately returned to the patient in their original bottle.  Medication #1: Tramadol (Ultram) Pill/Patch Count: 27 of 90 pills remain Pill/Patch Appearance: Markings consistent with prescribed medication Bottle Appearance: Standard pharmacy container. Clearly labeled. Filled Date: 02/06/2021 Last Medication intake:  Yesterday  Medication #2: Buprenorphine (Suboxone) Pill/Patch Count: 33 of 60 pills remain Pill/Patch Appearance: Markings consistent with prescribed medication Bottle Appearance: Standard pharmacy container. Clearly labeled. Filled Date: 02/04/2021 Last Medication intake:  Today

## 2021-02-20 ENCOUNTER — Ambulatory Visit: Payer: 59 | Admitting: Dermatology

## 2021-02-20 ENCOUNTER — Other Ambulatory Visit: Payer: Self-pay | Admitting: Family Medicine

## 2021-02-20 DIAGNOSIS — L578 Other skin changes due to chronic exposure to nonionizing radiation: Secondary | ICD-10-CM | POA: Diagnosis not present

## 2021-02-20 DIAGNOSIS — L570 Actinic keratosis: Secondary | ICD-10-CM | POA: Diagnosis not present

## 2021-02-20 DIAGNOSIS — L821 Other seborrheic keratosis: Secondary | ICD-10-CM | POA: Diagnosis not present

## 2021-02-20 DIAGNOSIS — E785 Hyperlipidemia, unspecified: Secondary | ICD-10-CM

## 2021-02-20 NOTE — Telephone Encounter (Signed)
Requested Prescriptions  Pending Prescriptions Disp Refills  . rosuvastatin (CRESTOR) 40 MG tablet [Pharmacy Med Name: Rosuvastatin Calcium 40 MG Oral Tablet] 90 tablet 1    Sig: TAKE 1 TABLET BY MOUTH  DAILY     Cardiovascular:  Antilipid - Statins Passed - 02/20/2021  9:40 PM      Passed - Total Cholesterol in normal range and within 360 days    Cholesterol, Total  Date Value Ref Range Status  04/18/2020 139 100 - 199 mg/dL Final         Passed - LDL in normal range and within 360 days    LDL Chol Calc (NIH)  Date Value Ref Range Status  04/18/2020 83 0 - 99 mg/dL Final         Passed - HDL in normal range and within 360 days    HDL  Date Value Ref Range Status  04/18/2020 45 >39 mg/dL Final         Passed - Triglycerides in normal range and within 360 days    Triglycerides  Date Value Ref Range Status  04/18/2020 49 0 - 149 mg/dL Final         Passed - Patient is not pregnant      Passed - Valid encounter within last 12 months    Recent Outpatient Visits          1 month ago [place] Jerrol Banana., MD   3 months ago ED (erectile dysfunction) of organic origin   [facility] Rosanna Randy, Retia Passe., MD   5 months ago [place] Jerrol Banana., MD   10 months ago Annual physical exam   [facility] Jerrol Banana., MD   1 year ago Hypogonadism in male   [facility] Jerrol Banana., MD      Future Appointments            In 5 months Jerrol Banana., MD [facility] [place]   In 6 months Ralene Bathe, MD [place]

## 2021-02-20 NOTE — Patient Instructions (Addendum)
If you have any questions or concerns for your doctor, please call our main line at [phone] and press option 4 to reach your doctor's medical assistant. If no one answers, please leave a voicemail as directed and we will return your call as soon as possible. Messages left after 4 pm will be answered the following business day.   You may also send us a message via MyChart. We typically respond to MyChart messages within 1-2 business days.  For prescription refills, please ask your pharmacy to contact our office. Our fax number is [phone].  If you have an urgent issue when the clinic is closed that cannot wait until the next business day, you can page your doctor at the number below.    Please note that while we do our best to be available for urgent issues outside of office hours, we are not available 24/7.   If you have an urgent issue and are unable to reach us, you may choose to seek medical care at your doctor's office, retail clinic, urgent care center, or emergency room.  If you have a medical emergency, please immediately call 911 or go to the emergency department.  Pager Numbers  - Dr. Kowalski: [phone]  - Dr. Moye: [phone]  - Dr. Stewart: [phone]  In the event of inclement weather, please call our main line at [phone] for an update on the status of any delays or closures.  Dermatology Medication Tips: Please keep the boxes that topical medications come in in order to help keep track of the instructions about where and how to use these. Pharmacies typically print the medication instructions only on the boxes and not directly on the medication tubes.   If your medication is too expensive, please contact our office at [phone] option 4 or send us a message through MyChart.   We are unable to tell what your co-pay for medications will be in advance as this is different depending on your insurance coverage. However, we may be able to find a substitute  medication at lower cost or fill out paperwork to get insurance to cover a needed medication.   If a prior authorization is required to get your medication covered by your insurance company, please allow us 1-2 business days to complete this process.  Drug prices often vary depending on where the prescription is filled and some pharmacies may offer cheaper prices.  The website www.goodrx.com contains coupons for medications through different pharmacies. The prices here do not account for what the cost may be with help from insurance (it may be cheaper with your insurance), but the website can give you the price if you did not use any insurance.  - You can print the associated coupon and take it with your prescription to the pharmacy.  - You may also stop by our office during regular business hours and pick up a GoodRx coupon card.  - If you need your prescription sent electronically to a different pharmacy, notify our office through [facility] MyChart or by phone at [phone] option 4.  Instructions for [facility] Medications  One or more of your medications was sent to the [facility] mail order compounding pharmacy. You will receive an email from them and can purchase the medicine through that link. It will then be mailed to your home at the address you confirmed. If for any reason you do not receive an email from them, please check your spam folder. If you still do not find the email,   please let us know. [facility] phone number is [phone].  Recommend taking Heliocare sun protection supplement daily in sunny weather for additional sun protection. For maximum protection on the sunniest days, you can take up to 2 capsules of regular Heliocare OR take 1 capsule of Heliocare Ultra. For prolonged exposure (such as a full day in the sun), you can repeat your dose of the supplement 4 hours after your first dose. Heliocare can be purchased at [facility] or at  [email or web address].

## 2021-02-20 NOTE — Progress Notes (Signed)
   Follow-Up Visit   Subjective  Shawn Crawford is a 60 y.o. male who presents for the following: Actinic Keratosis (Of the scalp S/P LN2 - recheck for new or persistent skin lesions). Patient has a brown spot on his L pretibial that he would like checked today.   The following portions of the chart were reviewed this encounter and updated as appropriate:   Tobacco  Allergies  Meds  Problems  Med Hx  Surg Hx  Fam Hx     Review of Systems:  No other skin or systemic complaints except as noted in HPI or Assessment and Plan.  Objective  Well appearing patient in no apparent distress; mood and affect are within normal limits.  A focused examination was performed including face, scalp, L leg and arms. Relevant physical exam findings are noted in the Assessment and Plan.  Objective  Scalp x 1: Erythematous thin papules/macules with gritty scale.   Assessment & Plan  AK (actinic keratosis) Scalp x 1  Destruction of lesion - Scalp x 1 Complexity: simple   Destruction method: cryotherapy   Informed consent: discussed and consent obtained   Timeout:  patient name, date of birth, surgical site, and procedure verified Lesion destroyed using liquid nitrogen: Yes   Region frozen until ice ball extended beyond lesion: Yes   Outcome: patient tolerated procedure well with no complications   Post-procedure details: wound care instructions given    Actinic Damage - Severe, confluent actinic changes with pre-cancerous actinic keratoses  - Severe, chronic, not at goal, secondary to cumulative UV radiation exposure over time - diffuse scaly erythematous macules and papules with underlying dyspigmentation - Discussed Prescription "Field Treatment" for Severe, Chronic Confluent Actinic Changes with Pre-Cancerous Actinic Keratoses Field treatment involves treatment of an entire area of skin that has confluent Actinic Changes (Sun/ Ultraviolet light damage) and PreCancerous Actinic Keratoses by  method of PhotoDynamic Therapy (PDT) and/or prescription Topical Chemotherapy agents such as 5-fluorouracil, 5-fluorouracil/calcipotriene, and/or imiquimod.  The purpose is to decrease the number of clinically evident and subclinical PreCancerous lesions to prevent progression to development of skin cancer by chemically destroying early precancer changes that may or may not be visible.  It has been shown to reduce the risk of developing skin cancer in the treated area. As a result of treatment, redness, scaling, crusting, and open sores may occur during treatment course. One or more than one of these methods may be used and may have to be used several times to control, suppress and eliminate the PreCancerous changes. Discussed treatment course, expected reaction, and possible side effects. - Recommend daily broad spectrum sunscreen SPF 30+ to sun-exposed areas, reapply every 2 hours as needed.  - Staying in the shade or wearing long sleeves, sun glasses (UVA+UVB protection) and wide brim hats (4-inch brim around the entire circumference of the hat) are also recommended. - Call for new or changing lesions. - Start 5FU/Calcipotriene mix BID x 7 days to the scalp   Seborrheic Keratoses - L pretibial - Stuck-on, waxy, tan-brown papules and/or plaques  - Benign-appearing - Discussed benign etiology and prognosis. - Observe - Call for any changes  Return in about 6 months (around 08/23/2021) for AK follow up .  [name], CMA, am acting as scribe for Sarina Ser, MD .  Documentation: I have reviewed the above documentation for accuracy and completeness, and I agree with the above.  Sarina Ser, MD

## 2021-02-25 ENCOUNTER — Encounter: Payer: Self-pay | Admitting: Dermatology

## 2021-02-25 ENCOUNTER — Encounter: Payer: Self-pay | Admitting: Student in an Organized Health Care Education/Training Program

## 2021-02-26 ENCOUNTER — Telehealth: Payer: Self-pay | Admitting: Student in an Organized Health Care Education/Training Program

## 2021-02-26 NOTE — Telephone Encounter (Signed)
I have texted Dr. Holley Raring as he is out of the office today. Awaiting his reply.

## 2021-02-26 NOTE — Telephone Encounter (Signed)
OK per Dr. Holley Raring to early fill [place]. Patient understands this may cost him out of pocket if insurance does not cover.

## 2021-03-07 ENCOUNTER — Encounter: Payer: 59 | Admitting: Student in an Organized Health Care Education/Training Program

## 2021-04-10 ENCOUNTER — Telehealth: Payer: Self-pay | Admitting: Student in an Organized Health Care Education/Training Program

## 2021-04-10 NOTE — Telephone Encounter (Signed)
A done by DW

## 2021-04-10 NOTE — Telephone Encounter (Signed)
Insurance is requiring a prior auth on [place] before he can pick up for tomorrow. Please let patient know when he can fill this.

## 2021-04-18 ENCOUNTER — Encounter: Payer: Self-pay | Admitting: Student in an Organized Health Care Education/Training Program

## 2021-04-18 ENCOUNTER — Encounter: Payer: Self-pay | Admitting: *Deleted

## 2021-05-09 ENCOUNTER — Other Ambulatory Visit: Payer: Self-pay | Admitting: Student in an Organized Health Care Education/Training Program

## 2021-05-09 DIAGNOSIS — G894 Chronic pain syndrome: Secondary | ICD-10-CM

## 2021-05-21 ENCOUNTER — Encounter: Payer: 59 | Admitting: Student in an Organized Health Care Education/Training Program

## 2021-05-28 ENCOUNTER — Other Ambulatory Visit: Payer: Self-pay

## 2021-05-28 ENCOUNTER — Encounter: Payer: Self-pay | Admitting: Student in an Organized Health Care Education/Training Program

## 2021-05-28 ENCOUNTER — Ambulatory Visit
Payer: 59 | Attending: Student in an Organized Health Care Education/Training Program | Admitting: Student in an Organized Health Care Education/Training Program

## 2021-05-28 VITALS — BP 110/74 | HR 99 | Temp 96.3°F | Resp 15 | Ht 71.0 in | Wt 150.0 lb

## 2021-05-28 DIAGNOSIS — M7918 Myalgia, other site: Secondary | ICD-10-CM | POA: Diagnosis present

## 2021-05-28 DIAGNOSIS — M5136 Other intervertebral disc degeneration, lumbar region: Secondary | ICD-10-CM | POA: Insufficient documentation

## 2021-05-28 DIAGNOSIS — G894 Chronic pain syndrome: Secondary | ICD-10-CM | POA: Diagnosis present

## 2021-05-28 DIAGNOSIS — M5416 Radiculopathy, lumbar region: Secondary | ICD-10-CM | POA: Insufficient documentation

## 2021-05-28 DIAGNOSIS — M545 Low back pain, unspecified: Secondary | ICD-10-CM | POA: Diagnosis present

## 2021-05-28 DIAGNOSIS — G8929 Other chronic pain: Secondary | ICD-10-CM

## 2021-05-28 DIAGNOSIS — M47816 Spondylosis without myelopathy or radiculopathy, lumbar region: Secondary | ICD-10-CM | POA: Diagnosis present

## 2021-05-28 MED ORDER — TRAMADOL HCL 50 MG PO TABS: 50.0000 mg | ORAL_TABLET | Freq: Three times a day (TID) | ORAL | 2 refills | Status: DC | PRN

## 2021-05-28 MED ORDER — BELBUCA 600 MCG BU FILM: 1.0000 | ORAL_FILM | Freq: Two times a day (BID) | BUCCAL | 2 refills | Status: DC | PRN

## 2021-05-28 NOTE — Progress Notes (Signed)
Nursing Pain Medication Assessment:  Safety precautions to be maintained throughout the outpatient stay will include: orient to surroundings, keep bed in low position, maintain call bell within reach at all times, provide assistance with transfer out of bed and ambulation.  Medication Inspection Compliance: Pill count conducted under aseptic conditions, in front of the patient. Neither the pills nor the bottle was removed from the patient's sight at any time. Once count was completed pills were immediately returned to the patient in their original bottle.  Medication #1: Butorphanol (Stadol) Pill/Patch Count: 27/60 Pill/Patch Appearance: Markings consistent with prescribed medication Bottle Appearance: Standard pharmacy container. Clearly labeled. Filled Date: 08 / 11 / 2022 Last Medication intake:  Today  Medication #2: Tramadol (Ultram) Pill/Patch Count:  59 of 90 pills remain Pill/Patch Appearance: Markings consistent with prescribed medication Bottle Appearance: Standard pharmacy container. Clearly labeled. Filled Date: 08 / 11 /  2022 Last Medication intake:  Yesterday

## 2021-05-28 NOTE — Progress Notes (Signed)
PROVIDER NOTE: Information contained herein reflects review and annotations entered in association with encounter. Interpretation of such information and data should be left to medically-trained personnel. Information provided to patient can be located elsewhere in the medical record under "Patient Instructions". Document created using STT-dictation technology, any transcriptional errors that may result from process are unintentional.    Patient: Shawn Crawford  Service Category: E/M  Provider: Gillis Santa, MD  DOB: 07/13/1961  DOS: 05/28/2021  Specialty: Interventional Pain Management  MRN: [id number]  Setting: Ambulatory outpatient  PCP: Jerrol Banana., MD  Type: Established Patient    Referring Provider: Jerrol Banana.,*  Location: Office  Delivery: Face-to-face     HPI  Shawn Crawford, a 60 y.o. year old male, is here today because of his Chronic pain syndrome [G89.4]. Shawn Crawford primary complain today is Back Pain (lower) Last encounter: My last encounter with him was on 02/19/2021. Pertinent problems: Shawn Crawford has Arteriosclerosis of coronary artery; Lumbar radiculopathy; Benign essential HTN; Chronic pain syndrome; Chronic midline low back pain without sciatica; Lumbar facet arthropathy; Lumbar degenerative disc disease; and Piriformis muscle pain on their pertinent problem list. Pain Assessment: Severity of Chronic pain is reported as a 1 /10. Location: Back Right, Left/to knees bilat. Onset: More than a month ago. Quality: Aching, Tingling. Timing: Constant. Modifying factor(s): meds. Vitals:  height is _0  (1.803 m) and weight is 150 lb (68 kg). His temporal temperature is 96.3 F (35.7 C) (abnormal). His blood pressure is 110/74 and his pulse is 99. His respiration is 15 and oxygen saturation is 100%.   Reason for encounter: medication management.    Patient is doing well overall. No change in medical history since last visit.  Patient's pain is at baseline.  Patient  continues multimodal pain regimen as prescribed.  States that it provides pain relief and improvement in functional status.  ROS  Constitutional: Denies any fever or chills Gastrointestinal: No reported hemesis, hematochezia, vomiting, or acute GI distress Musculoskeletal: Denies any acute onset joint swelling, redness, loss of ROM, or weakness Neurological: No reported episodes of acute onset apraxia, aphasia, dysarthria, agnosia, amnesia, paralysis, loss of coordination, or loss of consciousness  Medication Review  Buprenorphine HCl, Multiple Vitamin, amLODipine, aspirin EC, desonide, ketoconazole, rosuvastatin, tadalafil, testosterone cypionate, traMADol, and zolpidem  History Review  Allergy: Shawn Crawford is allergic to penicillins. Drug: Shawn Crawford  reports no history of drug use. Alcohol:  reports current alcohol use. Tobacco:  reports that he has never smoked. He has never used smokeless tobacco. Social: Shawn Crawford  reports that he has never smoked. He has never used smokeless tobacco. He reports current alcohol use. He reports that he does not use drugs. Medical:  has a past medical history of Actinic keratosis, Dysplastic nevus (09/06/2014), Dysplastic nevus (07/09/2015), cardiac catheterization, Hyperlipidemia, and Hypertension. Surgical: Shawn Crawford  has a past surgical history that includes Coronary artery bypass graft; Hernia repair; Appendectomy; Cardiac catheterization; Colonoscopy with propofol (N/A, 06/24/2018); Esophagogastroduodenoscopy (egd) with propofol (N/A, 06/24/2018); and Exam under anesthesia with hemorrhoidectomy (N/A, 11/11/2018). Family: family history includes Colon cancer in his sister; Diabetes in his father; Hyperlipidemia in his mother; Hypertension in his father; Thyroid cancer in his sister.  Laboratory Chemistry Profile   Renal Lab Results  Component Value Date   BUN 13 04/18/2020   CREATININE 0.99 04/18/2020   BCR 13 04/18/2020   GFRAA 96 04/18/2020   GFRNONAA  83 04/18/2020     Hepatic Lab Results  Component Value Date   AST 23 04/18/2020   ALT 16 04/18/2020   ALBUMIN 4.8 04/18/2020   ALKPHOS 77 04/18/2020     Electrolytes Lab Results  Component Value Date   NA 139 04/18/2020   K 5.0 04/18/2020   CL 100 04/18/2020   CALCIUM 9.6 04/18/2020     Bone Lab Results  Component Value Date   TESTOFREE 5.1 (L) 11/09/2019   TESTOSTERONE 916 01/22/2021     Inflammation (CRP: Acute Phase) (ESR: Chronic Phase) Lab Results  Component Value Date   ESRSEDRATE 2 08/06/2018       Note: Above Lab results reviewed.  Recent Imaging Review  CT Abdomen Pelvis W Contrast CLINICAL DATA:  60 year old male with a history of unexplained weight loss  EXAM: CT CHEST, ABDOMEN, AND PELVIS WITH CONTRAST  TECHNIQUE: Multidetector CT imaging of the chest, abdomen and pelvis was performed following the standard protocol during bolus administration of intravenous contrast.  CONTRAST:  156m OMNIPAQUE IOHEXOL 300 MG/ML  SOLN  COMPARISON:  None.  FINDINGS: CT CHEST FINDINGS  Cardiovascular:  Heart:  Surgical changes of median sternotomy and CABG. Native coronary calcifications  Aorta:  Unremarkable course, caliber, contour of the thoracic aorta. No aneurysm or dissection flap. No periaortic fluid. Calcifications of the aortic arch. Branch vessels are patent. Descending thoracic aorta with mild atherosclerotic changes.  Pulmonary arteries:  Study is not tailored for the most sensitive evaluation of the pulmonary arteries, however, no central filling defects identified.  Mediastinum/Nodes: Small lymph nodes of the mediastinum. Calcified lymph nodes in the subcarinal and left hilar nodal stations. Unremarkable appearance of the thoracic esophagus.  Unremarkable appearance of the thoracic inlet.  Lungs/Pleura: Central airways are clear. No pleural effusion. No confluent airspace disease.  No pneumothorax.  Musculoskeletal: No acute  displaced fracture. Degenerative changes of the spine.  Review of the MIP images confirms the above findings.  CT ABDOMEN PELVIS FINDINGS  Hepatobiliary: Unremarkable appearance of liver. Unremarkable gallbladder  Pancreas: Unremarkable pancreas  Spleen: Punctate calcifications throughout the spleen, compatible with prior granulomatous disease.  Adrenals/Urinary Tract: Unremarkable appearance of the adrenal glands. No evidence of hydronephrosis of the right or left kidney. No nephrolithiasis. Unremarkable course of the bilateral ureters. Unremarkable appearance of the urinary bladder.  Stomach/Bowel: Unremarkable stomach. Unremarkable small bowel. Appendectomy. Moderate stool burden. Colonic diverticular disease without evidence of acute inflammation.  Vascular/Lymphatic: Atherosclerotic changes of the abdominal aorta. Mesenteric arteries and bilateral renal arteries are patent. Bilateral iliac arteries and proximal femoral arteries patent.  Reproductive: Diameter of the prostate measures 4.4 cm  Other: None  Musculoskeletal: No acute displaced fracture.  Degenerative changes.  IMPRESSION: No acute CT finding.  No finding to account for weight loss.  Surgical changes of median sternotomy and CABG.  Aortic Atherosclerosis (ICD10-I70.0).  Diverticular disease without evidence of acute diverticulitis.  Electronically Signed   By: JCorrie MckusickD.O.   On: 05/06/2018 09:57 CT Chest W Contrast CLINICAL DATA:  60year old male with a history of unexplained weight loss  EXAM: CT CHEST, ABDOMEN, AND PELVIS WITH CONTRAST  TECHNIQUE: Multidetector CT imaging of the chest, abdomen and pelvis was performed following the standard protocol during bolus administration of intravenous contrast.  CONTRAST:  108mOMNIPAQUE IOHEXOL 300 MG/ML  SOLN  COMPARISON:  None.  FINDINGS: CT CHEST FINDINGS  Cardiovascular:  Heart:  Surgical changes of median sternotomy and  CABG. Native coronary calcifications  Aorta:  Unremarkable course, caliber, contour of the thoracic aorta. No aneurysm or dissection flap. No  periaortic fluid. Calcifications of the aortic arch. Branch vessels are patent. Descending thoracic aorta with mild atherosclerotic changes.  Pulmonary arteries:  Study is not tailored for the most sensitive evaluation of the pulmonary arteries, however, no central filling defects identified.  Mediastinum/Nodes: Small lymph nodes of the mediastinum. Calcified lymph nodes in the subcarinal and left hilar nodal stations. Unremarkable appearance of the thoracic esophagus.  Unremarkable appearance of the thoracic inlet.  Lungs/Pleura: Central airways are clear. No pleural effusion. No confluent airspace disease.  No pneumothorax.  Musculoskeletal: No acute displaced fracture. Degenerative changes of the spine.  Review of the MIP images confirms the above findings.  CT ABDOMEN PELVIS FINDINGS  Hepatobiliary: Unremarkable appearance of liver. Unremarkable gallbladder  Pancreas: Unremarkable pancreas  Spleen: Punctate calcifications throughout the spleen, compatible with prior granulomatous disease.  Adrenals/Urinary Tract: Unremarkable appearance of the adrenal glands. No evidence of hydronephrosis of the right or left kidney. No nephrolithiasis. Unremarkable course of the bilateral ureters. Unremarkable appearance of the urinary bladder.  Stomach/Bowel: Unremarkable stomach. Unremarkable small bowel. Appendectomy. Moderate stool burden. Colonic diverticular disease without evidence of acute inflammation.  Vascular/Lymphatic: Atherosclerotic changes of the abdominal aorta. Mesenteric arteries and bilateral renal arteries are patent. Bilateral iliac arteries and proximal femoral arteries patent.  Reproductive: Diameter of the prostate measures 4.4 cm  Other: None  Musculoskeletal: No acute displaced fracture.  Degenerative  changes.  IMPRESSION: No acute CT finding.  No finding to account for weight loss.  Surgical changes of median sternotomy and CABG.  Aortic Atherosclerosis (ICD10-I70.0).  Diverticular disease without evidence of acute diverticulitis.  Electronically Signed   By: Corrie Mckusick D.O.   On: 05/06/2018 09:57 Note: Reviewed        Physical Exam  General appearance: Well nourished, well developed, and well hydrated. In no apparent acute distress Mental status: Alert, oriented x 3 (person, place, & time)       Respiratory: No evidence of acute respiratory distress Eyes: PERLA Vitals: BP 110/74   Pulse 99   Temp (!) 96.3 F (35.7 C) (Temporal)   Resp 15   Ht _0  (1.803 m)   Wt 150 lb (68 kg)   SpO2 100%   BMI 20.92 kg/m  BMI: Estimated body mass index is 20.92 kg/m as calculated from the following:   Height as of this encounter: _1  (1.803 m).   Weight as of this encounter: 150 lb (68 kg). Ideal: Ideal body weight: 75.3 kg (166 lb 0.1 oz)  Assessment   Status Diagnosis  Controlled Controlled Controlled 1. Chronic pain syndrome   2. Lumbar radiculopathy   3. Lumbar facet arthropathy   4. Piriformis muscle pain   5. Lumbar degenerative disc disease   6. Chronic midline low back pain without sciatica       Plan of Care   Shawn Crawford has a current medication list which includes the following long-term medication(s): amlodipine, rosuvastatin, tadalafil, testosterone cypionate, [START ON 06/08/2021] tramadol, and zolpidem.  Pharmacotherapy (Medications Ordered): Meds ordered this encounter  Medications   Buprenorphine HCl (BELBUCA) 600 MCG FILM    Sig: Place 1 Film inside cheek every 12 (twelve) hours as needed. For chronic pain syndrome    Dispense:  60 each    Refill:  2   traMADol (ULTRAM) 50 MG tablet    Sig: Take 1 tablet (50 mg total) by mouth every 8 (eight) hours as needed for severe pain. Month last 30 days.    Dispense:  90 tablet  Refill:   2    [place] STOP ACT - Not applicable. Fill one day early if pharmacy is closed on scheduled refill date. .    Follow-up plan:   Return in about 3 months (around 08/28/2021) for Medication Management, in person.   Recent Visits No visits were found meeting these conditions. Showing recent visits within past 90 days and meeting all other requirements Today's Visits Date Type Provider Dept  05/28/21 Office Visit Gillis Santa, MD [facility]  Showing today's visits and meeting all other requirements Future Appointments No visits were found meeting these conditions. Showing future appointments within next 90 days and meeting all other requirements I discussed the assessment and treatment plan with the patient. The patient was provided an opportunity to ask questions and all were answered. The patient agreed with the plan and demonstrated an understanding of the instructions.  Patient advised to call back or seek an in-person evaluation if the symptoms or condition worsens.  Duration of encounter: 30 minutes.  Note by: Gillis Santa, MD Date: 05/28/2021; Time: 8:55 AM

## 2021-06-12 ENCOUNTER — Telehealth: Payer: 59 | Admitting: Emergency Medicine

## 2021-06-12 DIAGNOSIS — R197 Diarrhea, unspecified: Secondary | ICD-10-CM

## 2021-06-12 DIAGNOSIS — R112 Nausea with vomiting, unspecified: Secondary | ICD-10-CM | POA: Diagnosis not present

## 2021-06-12 MED ORDER — ONDANSETRON 4 MG PO TBDP
4.0000 mg | ORAL_TABLET | Freq: Three times a day (TID) | ORAL | 0 refills | Status: DC | PRN
Start: 2021-06-12 — End: 2021-12-27

## 2021-06-12 NOTE — Progress Notes (Signed)
E-Visit for Vomiting  We are sorry that you are not feeling well. Here is how we plan to help!  Based on what you have shared with me it looks like you have a Virus that is irritating your GI tract.  Vomiting is the forceful emptying of a portion of the stomach's content through the mouth.  Although nausea and vomiting can make you feel miserable, it's important to remember that these are not diseases, but rather symptoms of an underlying illness.  When we treat short term symptoms, we always caution that any symptoms that persist should be fully evaluated in a medical office.  I have prescribed a medication that will help alleviate your symptoms and allow you to stay hydrated:  Zofran 4 mg 1 tablet every 8 hours as needed for nausea and vomiting  HOME CARE: Drink clear liquids.  This is very important! Dehydration (the lack of fluid) can lead to a serious complication.  Start off with 1 tablespoon every 5 minutes for 8 hours. You may begin eating bland foods after 8 hours without vomiting.  Start with saltine crackers, white bread, rice, mashed potatoes, applesauce. After 48 hours on a bland diet, you may resume a normal diet. Try to go to sleep.  Sleep often empties the stomach and relieves the need to vomit.  GET HELP RIGHT AWAY IF:  Your symptoms do not improve or worsen within 2 days after treatment. You have a fever for over 3 days. You cannot keep down fluids after trying the medication.  MAKE SURE YOU:  Understand these instructions. Will watch your condition. Will get help right away if you are not doing well or get worse.   Thank you for choosing an e-visit.  Your e-visit answers were reviewed by a board certified advanced clinical practitioner to complete your personal care plan. Depending upon the condition, your plan could have included both over the counter or prescription medications.  Please review your pharmacy choice. Make sure the pharmacy is open so you can pick  up prescription now. If there is a problem, you may contact your provider through MyChart messaging and have the prescription routed to another pharmacy.  Your safety is important to us. If you have drug allergies check your prescription carefully.   For the next 24 hours you can use MyChart to ask questions about today's visit, request a non-urgent call back, or ask for a work or school excuse. You will get an email in the next two days asking about your experience. I hope that your e-visit has been valuable and will speed your recovery.  Approximately 5 minutes was used in reviewing the patient's chart, questionnaire, prescribing medications, and documentation.  

## 2021-06-14 ENCOUNTER — Telehealth: Payer: Self-pay

## 2021-06-14 ENCOUNTER — Emergency Department: Payer: 59

## 2021-06-14 ENCOUNTER — Inpatient Hospital Stay
Admission: EM | Admit: 2021-06-14 | Discharge: 2021-06-17 | DRG: 389 | Disposition: A | Discharge status: Home / Self Care | Payer: 59 | Attending: Family Medicine | Admitting: Family Medicine

## 2021-06-14 ENCOUNTER — Encounter: Payer: Self-pay | Admitting: Emergency Medicine

## 2021-06-14 ENCOUNTER — Other Ambulatory Visit: Payer: Self-pay

## 2021-06-14 DIAGNOSIS — I2510 Atherosclerotic heart disease of native coronary artery without angina pectoris: Secondary | ICD-10-CM | POA: Diagnosis present

## 2021-06-14 DIAGNOSIS — Z8249 Family history of ischemic heart disease and other diseases of the circulatory system: Secondary | ICD-10-CM | POA: Diagnosis not present

## 2021-06-14 DIAGNOSIS — Z7982 Long term (current) use of aspirin: Secondary | ICD-10-CM

## 2021-06-14 DIAGNOSIS — Z951 Presence of aortocoronary bypass graft: Secondary | ICD-10-CM | POA: Diagnosis not present

## 2021-06-14 DIAGNOSIS — K567 Ileus, unspecified: Secondary | ICD-10-CM | POA: Diagnosis present

## 2021-06-14 DIAGNOSIS — I493 Ventricular premature depolarization: Secondary | ICD-10-CM | POA: Diagnosis present

## 2021-06-14 DIAGNOSIS — Z880 Allergy status to penicillin: Secondary | ICD-10-CM

## 2021-06-14 DIAGNOSIS — E785 Hyperlipidemia, unspecified: Secondary | ICD-10-CM | POA: Diagnosis present

## 2021-06-14 DIAGNOSIS — I10 Essential (primary) hypertension: Secondary | ICD-10-CM | POA: Diagnosis present

## 2021-06-14 DIAGNOSIS — E876 Hypokalemia: Secondary | ICD-10-CM | POA: Diagnosis present

## 2021-06-14 DIAGNOSIS — R1084 Generalized abdominal pain: Secondary | ICD-10-CM | POA: Diagnosis not present

## 2021-06-14 DIAGNOSIS — A084 Viral intestinal infection, unspecified: Secondary | ICD-10-CM | POA: Diagnosis present

## 2021-06-14 DIAGNOSIS — Z79899 Other long term (current) drug therapy: Secondary | ICD-10-CM | POA: Diagnosis not present

## 2021-06-14 DIAGNOSIS — Z9049 Acquired absence of other specified parts of digestive tract: Secondary | ICD-10-CM | POA: Diagnosis not present

## 2021-06-14 DIAGNOSIS — K56609 Unspecified intestinal obstruction, unspecified as to partial versus complete obstruction: Principal | ICD-10-CM | POA: Diagnosis present

## 2021-06-14 DIAGNOSIS — Z79890 Hormone replacement therapy (postmenopausal): Secondary | ICD-10-CM

## 2021-06-14 DIAGNOSIS — L570 Actinic keratosis: Secondary | ICD-10-CM | POA: Diagnosis present

## 2021-06-14 DIAGNOSIS — Z23 Encounter for immunization: Secondary | ICD-10-CM

## 2021-06-14 DIAGNOSIS — E878 Other disorders of electrolyte and fluid balance, not elsewhere classified: Secondary | ICD-10-CM | POA: Diagnosis present

## 2021-06-14 DIAGNOSIS — E871 Hypo-osmolality and hyponatremia: Secondary | ICD-10-CM | POA: Diagnosis present

## 2021-06-14 DIAGNOSIS — G894 Chronic pain syndrome: Secondary | ICD-10-CM | POA: Diagnosis present

## 2021-06-14 DIAGNOSIS — Z20822 Contact with and (suspected) exposure to covid-19: Secondary | ICD-10-CM | POA: Diagnosis present

## 2021-06-14 DIAGNOSIS — E861 Hypovolemia: Secondary | ICD-10-CM | POA: Diagnosis present

## 2021-06-14 DIAGNOSIS — K529 Noninfective gastroenteritis and colitis, unspecified: Secondary | ICD-10-CM

## 2021-06-14 DIAGNOSIS — R112 Nausea with vomiting, unspecified: Secondary | ICD-10-CM | POA: Diagnosis not present

## 2021-06-14 LAB — CBC
HCT: 42.8 % (ref 39.0–52.0)
Hemoglobin: 15.6 g/dL (ref 13.0–17.0)
MCH: 31.4 pg (ref 26.0–34.0)
MCHC: 36.4 g/dL — ABNORMAL HIGH (ref 30.0–36.0)
MCV: 86.1 fL (ref 80.0–100.0)
Platelets: 200 10*3/uL (ref 150–400)
RBC: 4.97 MIL/uL (ref 4.22–5.81)
RDW: 11.9 % (ref 11.5–15.5)
WBC: 5.3 10*3/uL (ref 4.0–10.5)
nRBC: 0 % (ref 0.0–0.2)

## 2021-06-14 LAB — RESP PANEL BY RT-PCR (FLU A&B, COVID) ARPGX2
Influenza A by PCR: NEGATIVE
Influenza B by PCR: NEGATIVE
SARS Coronavirus 2 by RT PCR: NEGATIVE

## 2021-06-14 LAB — COMPREHENSIVE METABOLIC PANEL
ALT: 19 U/L (ref 0–44)
AST: 18 U/L (ref 15–41)
Albumin: 3.6 g/dL (ref 3.5–5.0)
Alkaline Phosphatase: 42 U/L (ref 38–126)
Anion gap: 11 (ref 5–15)
BUN: 25 mg/dL — ABNORMAL HIGH (ref 8–23)
CO2: 29 mmol/L (ref 22–32)
Calcium: 7.7 mg/dL — ABNORMAL LOW (ref 8.9–10.3)
Chloride: 94 mmol/L — ABNORMAL LOW (ref 98–111)
Creatinine, Ser: 1.11 mg/dL (ref 0.61–1.24)
GFR, Estimated: >60 mL/min (ref >60)
Glucose, Bld: 155 mg/dL — ABNORMAL HIGH (ref 70–99)
Potassium: 2.8 mmol/L — ABNORMAL LOW (ref 3.5–5.1)
Sodium: 134 mmol/L — ABNORMAL LOW (ref 135–145)
Total Bilirubin: 1.1 mg/dL (ref 0.3–1.2)
Total Protein: 6.4 g/dL — ABNORMAL LOW (ref 6.5–8.1)

## 2021-06-14 LAB — TROPONIN I (HIGH SENSITIVITY)
Troponin I (High Sensitivity): 8 ng/L (ref <18)
Troponin I (High Sensitivity): 12 ng/L (ref <18)

## 2021-06-14 LAB — LIPASE, BLOOD: Lipase: 23 U/L (ref 11–51)

## 2021-06-14 LAB — MAGNESIUM: Magnesium: 2.3 mg/dL (ref 1.7–2.4)

## 2021-06-14 LAB — PHOSPHORUS: Phosphorus: 3.8 mg/dL (ref 2.5–4.6)

## 2021-06-14 LAB — HIV ANTIBODY (ROUTINE TESTING W REFLEX): HIV Screen 4th Generation wRfx: NONREACTIVE

## 2021-06-14 MED ORDER — POTASSIUM CHLORIDE 10 MEQ/100ML IV SOLN
10.0000 meq | INTRAVENOUS | Status: AC
  Administered 2021-06-14 (×3): 10 meq via INTRAVENOUS
  Filled 2021-06-14 (×3): qty 100

## 2021-06-14 MED ORDER — ENOXAPARIN SODIUM 40 MG/0.4ML IJ SOSY
40.0000 mg | PREFILLED_SYRINGE | INTRAMUSCULAR | Status: DC
  Administered 2021-06-15 – 2021-06-16 (×3): 40 mg via SUBCUTANEOUS
  Filled 2021-06-14 (×3): qty 0.4

## 2021-06-14 MED ORDER — ACETAMINOPHEN 325 MG PO TABS: 650.0000 mg | ORAL_TABLET | Freq: Four times a day (QID) | ORAL | Status: DC | PRN

## 2021-06-14 MED ORDER — TRAMADOL HCL 50 MG PO TABS: 50.0000 mg | ORAL_TABLET | Freq: Three times a day (TID) | ORAL | Status: DC | PRN

## 2021-06-14 MED ORDER — POTASSIUM CHLORIDE 10 MEQ/100ML IV SOLN
10.0000 meq | INTRAVENOUS | Status: DC
Start: 2021-06-14 — End: 2021-06-14
  Administered 2021-06-14: 10 meq via INTRAVENOUS
  Filled 2021-06-14: qty 100

## 2021-06-14 MED ORDER — ADULT MULTIVITAMIN W/MINERALS CH
1.0000 | ORAL_TABLET | Freq: Every day | ORAL | Status: DC
Start: 2021-06-14 — End: 2021-06-17
  Administered 2021-06-15 – 2021-06-17 (×3): 1 via ORAL
  Filled 2021-06-14 (×3): qty 1

## 2021-06-14 MED ORDER — MORPHINE SULFATE (PF) 2 MG/ML IV SOLN: 2.0000 mg | INTRAVENOUS | Status: DC | PRN

## 2021-06-14 MED ORDER — IOHEXOL 350 MG/ML SOLN
100.0000 mL | Freq: Once | INTRAVENOUS | Status: AC | PRN
  Administered 2021-06-14: 100 mL via INTRAVENOUS
  Filled 2021-06-14: qty 100

## 2021-06-14 MED ORDER — ACETAMINOPHEN 650 MG RE SUPP: 650.0000 mg | Freq: Four times a day (QID) | RECTAL | Status: DC | PRN

## 2021-06-14 MED ORDER — ROSUVASTATIN CALCIUM 10 MG PO TABS
40.0000 mg | ORAL_TABLET | Freq: Every day | ORAL | Status: DC
  Administered 2021-06-15 – 2021-06-16 (×3): 40 mg via ORAL
  Filled 2021-06-14: qty 2
  Filled 2021-06-14 (×3): qty 4

## 2021-06-14 MED ORDER — ONDANSETRON HCL 4 MG/2ML IJ SOLN
4.0000 mg | Freq: Once | INTRAMUSCULAR | Status: AC
  Administered 2021-06-14: 4 mg via INTRAVENOUS
  Filled 2021-06-14: qty 2

## 2021-06-14 MED ORDER — BUPRENORPHINE HCL 600 MCG BU FILM
1.0000 | ORAL_FILM | Freq: Two times a day (BID) | BUCCAL | Status: DC | PRN
  Administered 2021-06-15 – 2021-06-17 (×3): 1 via BUCCAL
  Filled 2021-06-14 (×3): qty 1

## 2021-06-14 MED ORDER — LACTATED RINGERS IV SOLN: INTRAVENOUS | Status: DC

## 2021-06-14 MED ORDER — HYDROCODONE-ACETAMINOPHEN 5-325 MG PO TABS: 1.0000 | ORAL_TABLET | ORAL | Status: DC | PRN

## 2021-06-14 MED ORDER — LACTATED RINGERS IV BOLUS
2000.0000 mL | Freq: Once | INTRAVENOUS | Status: AC
  Administered 2021-06-14: 1000 mL via INTRAVENOUS

## 2021-06-14 MED ORDER — ASPIRIN EC 81 MG PO TBEC
81.0000 mg | DELAYED_RELEASE_TABLET | Freq: Every day | ORAL | Status: DC
  Administered 2021-06-14 – 2021-06-17 (×4): 81 mg via ORAL
  Filled 2021-06-14 (×5): qty 1

## 2021-06-14 MED ORDER — INFLUENZA VAC SPLIT QUAD 0.5 ML IM SUSY
0.5000 mL | PREFILLED_SYRINGE | INTRAMUSCULAR | Status: AC
  Administered 2021-06-16: 0.5 mL via INTRAMUSCULAR
  Filled 2021-06-14 (×2): qty 0.5

## 2021-06-14 MED ORDER — ONDANSETRON HCL 4 MG PO TABS: 4.0000 mg | ORAL_TABLET | Freq: Four times a day (QID) | ORAL | Status: DC | PRN

## 2021-06-14 MED ORDER — AMLODIPINE BESYLATE 5 MG PO TABS
5.0000 mg | ORAL_TABLET | Freq: Every day | ORAL | Status: DC
  Administered 2021-06-14 – 2021-06-17 (×4): 5 mg via ORAL
  Filled 2021-06-14 (×5): qty 1

## 2021-06-14 MED ORDER — CALCIUM GLUCONATE-NACL 1-0.675 GM/50ML-% IV SOLN
1.0000 g | Freq: Once | INTRAVENOUS | Status: AC
  Administered 2021-06-14: 1000 mg via INTRAVENOUS
  Filled 2021-06-14 (×2): qty 50

## 2021-06-14 MED ORDER — ONDANSETRON HCL 4 MG/2ML IJ SOLN: 4.0000 mg | Freq: Four times a day (QID) | INTRAMUSCULAR | Status: DC | PRN

## 2021-06-14 MED ORDER — MORPHINE SULFATE (PF) 4 MG/ML IV SOLN
4.0000 mg | Freq: Once | INTRAVENOUS | Status: AC
  Administered 2021-06-14: 4 mg via INTRAVENOUS
  Filled 2021-06-14: qty 1

## 2021-06-14 MED ORDER — ZOLPIDEM TARTRATE 5 MG PO TABS
5.0000 mg | ORAL_TABLET | Freq: Every evening | ORAL | Status: DC | PRN
  Administered 2021-06-15 – 2021-06-16 (×3): 5 mg via ORAL
  Filled 2021-06-14 (×3): qty 1

## 2021-06-14 NOTE — H&P (Signed)
History and Physical    Shawn Crawford [id number] DOB: 1960-11-26 DOA: 06/14/2021  PCP: Jerrol Banana., MD  Chief Complaint: Abdominal pain  HPI: Shawn Crawford is a 60 y.o. male with a past medical history of hypertension, hyperlipidemia, coronary artery disease status post CABG, history of appendectomy, chronic pain on buprenorphine.  This patient presents to the emergency department due to abdominal pain for the past week.  Associated nausea vomiting and diarrhea.  Started last Saturday.  Was initially mildly febrile.  No chills.  Has had approximately 4 episodes per day of nonbloody nonbilious emesis.  Last episode of emesis was 7 AM this morning.  He also had 3 episodes of diarrhea since this all started that was nonbloody.  He endorses lower abdominal pain bilaterally that is crampy in nature.  No sick contacts.  No strange food consumption.  He is fully vaccinated for COVID.  No urinary complaints.  No chest pain.  ED Course: In the emergency department CT imaging of the abdomen pelvis with contrast was obtained and showed findings consistent with gastroenteritis with possible ileus versus partial small bowel obstruction.  Currently NPO.  Lipase is unremarkable.  He has electrolyte derangements with mild hyponatremia and mild hypochloremia with severe hypokalemia.  There is also some noted mild hypocalcemia.  CBC shows no leukocytosis or anemia.  No acute kidney injury.  EKG shows sinus rhythm with PVCs.  Has been given 2 L of lactated Ringer's via bolus in the emergency department and some pain medication with morphine and antiemetics with Zofran.  His potassium has been repleted initially with KCl IV.  Review of Systems: 14 point review of systems is negative except for what is mentioned above in the HPI.   Past Medical History:  Diagnosis Date   Actinic keratosis    Dysplastic nevus 09/06/2014   Epigastric at mid line. Moderate to severe atypia, lateral margin involved.  Excised 11/07/2014, margins free.   Dysplastic nevus 07/09/2015   Left lat. epigastric/costal margin. Moderate aypia, limited margins free.    Hx of cardiac catheterization    Hyperlipidemia    Hypertension     Past Surgical History:  Procedure Laterality Date   APPENDECTOMY     CARDIAC CATHETERIZATION     COLONOSCOPY WITH PROPOFOL N/A 06/24/2018   Procedure: COLONOSCOPY WITH PROPOFOL;  Surgeon: Virgel Manifold, MD;  Location: [facility];  Service: Endoscopy;  Laterality: N/A;   CORONARY ARTERY BYPASS GRAFT     x 3   ESOPHAGOGASTRODUODENOSCOPY (EGD) WITH PROPOFOL N/A 06/24/2018   Procedure: ESOPHAGOGASTRODUODENOSCOPY (EGD) WITH PROPOFOL;  Surgeon: Virgel Manifold, MD;  Location: [facility];  Service: Endoscopy;  Laterality: N/A;   EVALUATION UNDER ANESTHESIA WITH HEMORRHOIDECTOMY N/A 11/11/2018   Procedure: HEMORRHOIDAL LIGATION/PEXY, PARTIAL LATERAL SPINCTEROTOMY;  Surgeon: Michael Boston, MD;  Location: [facility];  Service: General;  Laterality: N/A;   HERNIA REPAIR      Social History   Socioeconomic History   Marital status: Single    Spouse name: Not on file   Number of children: Not on file   Years of education: Not on file   Highest education level: Not on file  Occupational History   Not on file  Tobacco Use   Smoking status: Never   Smokeless tobacco: Never  Vaping Use   Vaping Use: Never used  Substance and Sexual Activity   Alcohol use: Yes    Alcohol/week: 0.0 standard drinks    Comment: glass of wine occasionally  Drug use: No   Sexual activity: Not on file  Other Topics Concern   Not on file  Social History Narrative   Not on file   Social Determinants of Health   Financial Resource Strain: Not on file  Food Insecurity: Not on file  Transportation Needs: Not on file  Physical Activity: Not on file  Stress: Not on file  Social Connections: Not on file  Intimate Partner Violence: Not on file    Allergies  Allergen Reactions    Penicillins Itching and Other (See Comments)    Did it involve swelling of the face/tongue/throat, SOB, or low BP? No Did it involve sudden or severe rash/hives, skin peeling, or any reaction on the inside of your mouth or nose? No Did you need to seek medical attention at a hospital or doctor's office? Yes When did it last happen? Childhood allergy If all above answers are "NO", may proceed with cephalosporin use.     Family History  Problem Relation Age of Onset   Hyperlipidemia Mother    Diabetes Father    Hypertension Father    Thyroid cancer Sister    Colon cancer Sister     Prior to Admission medications   Medication Sig Start Date End Date Taking? Authorizing Provider  amLODipine (NORVASC) 5 MG tablet TAKE 1 TABLET BY MOUTH  DAILY 09/13/20   Jerrol Banana., MD  aspirin EC 81 MG tablet Take 81 mg by mouth daily.    [provider]  Buprenorphine HCl (BELBUCA) 600 MCG FILM Place 1 Film inside cheek every 12 (twelve) hours as needed. For chronic pain syndrome 06/08/21   Gillis Santa, MD  desonide (DESOWEN) 0.05 % ointment Apply topically 3 (three) times a week. Apply topically on Mondays, Wednesdays, and Fridays. 06/13/20   Ralene Bathe, MD  ketoconazole (NIZORAL) 2 % cream Apply topically on Tuesdays, Thursdays, and Saturdays. 06/13/20   Ralene Bathe, MD  MULTIPLE VITAMINS PO Take 1 tablet by mouth daily.  10/22/11   [provider]  ondansetron (ZOFRAN ODT) 4 MG disintegrating tablet Take 1 tablet (4 mg total) by mouth every 8 (eight) hours as needed for nausea or vomiting. 06/12/21   Montine Circle, PA-C  rosuvastatin (CRESTOR) 40 MG tablet TAKE 1 TABLET BY MOUTH  DAILY 02/20/21   Jerrol Banana., MD  tadalafil (CIALIS) 5 MG tablet Take 1 tablet (5 mg total) by mouth daily. 11/21/20   Jerrol Banana., MD  testosterone cypionate (DEPOTESTOSTERONE CYPIONATE) 200 MG/ML injection Inject 0.5 mLs (100 mg total) into the muscle every 14  (fourteen) days. Inject 5 cc IM every 2 weeks 12/14/20   Jerrol Banana., MD  traMADol (ULTRAM) 50 MG tablet Take 1 tablet (50 mg total) by mouth every 8 (eight) hours as needed for severe pain. Month last 30 days. 06/08/21 09/06/21  Gillis Santa, MD  zolpidem (AMBIEN CR) 12.5 MG CR tablet TAKE ONE TABLET BY MOUTH AT BEDTIME AS NEEDED FOR SLEEP. (STOP ZOLPIDEM 10 MG) 12/14/20   Jerrol Banana., MD    Physical Exam: Vitals:   06/14/21 1209 06/14/21 1214  BP:  128/78  Pulse:  95  Resp:  14  Temp:  98.6 F (37 C)  TempSrc:  Oral  SpO2:  95%  Weight: 64 kg   Height: '5\' 11"'$  (1.803 m)      General:  Appears calm and comfortable and is in NAD Cardiovascular:  RRR, no m/r/g.  Respiratory:  CTA bilaterally with no wheezes/rales/rhonchi.  Normal respiratory effort. Abdomen:  soft, mild lower abdominal tenderness bilaterally with voluntary guarding, ND, NABS, nonsurgical abdomen.  No rigidity, no peritoneal signs. Skin:  no rash or induration seen on limited exam Musculoskeletal:  grossly normal tone BUE/BLE, good ROM, no bony abnormality Lower extremity:  No LE edema.  Limited foot exam with no ulcerations.  2+ distal pulses. Psychiatric:  grossly normal mood and affect, speech fluent and appropriate, AOx3 Neurologic:  CN 2-12 grossly intact, moves all extremities in coordinated fashion, sensation intact    Radiological Exams on Admission: Independently reviewed - see discussion in A/P where applicable  CT ABDOMEN PELVIS W CONTRAST  Result Date: 06/14/2021 CLINICAL DATA:  Anterior low abdominal pain. EXAM: CT ABDOMEN AND PELVIS WITH CONTRAST TECHNIQUE: Multidetector CT imaging of the abdomen and pelvis was performed using the standard protocol following bolus administration of intravenous contrast. CONTRAST:  145m OMNIPAQUE IOHEXOL 350 MG/ML SOLN COMPARISON:  CT abdomen/pelvis 05/05/2018 FINDINGS: Lower chest: There is a calcified granuloma in the left lung base, unchanged.  The lung bases are otherwise clear. Coronary artery calcifications are noted. The imaged heart is otherwise unremarkable. Hepatobiliary: The liver and gallbladder are unremarkable. There is no biliary ductal dilatation. Pancreas: Unremarkable. Spleen: There are numerous calcified granulomas throughout the spleen, unchanged. Adrenals/Urinary Tract: The adrenals are unremarkable. There is a 3-4 mm left upper pole renal stone and a punctate right upper pole renal stone, new since 2019. There is a small cyst in the left interpolar region. There are no suspicious lesions. There is no hydronephrosis or hydroureter. The bladder is unremarkable. Stomach/Bowel: The stomach is decompressed, grossly unremarkable. There is wall thickening involving the proximal bowel most notably in the fourth portion of the duodenum. There is moderate dilation of multiple loops of jejunum with the diameter measuring up to 4.5 cm. There is fecalization of material within multiple loops of small bowel. There is clumped decompressed small bowel in the pelvis, though the terminal ileum is not completely decompressed. Gas and stool is present to the rectum. There is no pneumatosis intestinalis. There is moderate stool throughout the colon. Vascular/Lymphatic: There is scattered calcified atherosclerotic plaque throughout the abdominal aorta. There is multifocal bulging without evidence of frank aneurysmal dilation. The major branch vessels are patent. The main portal and splenic veins are patent. There is no portal venous gas. There is no abdominal or pelvic lymphadenopathy. Reproductive: The prostate seminal vesicles are unremarkable. Other: There is no ascites or free air. Musculoskeletal: There is no acute osseous abnormality or aggressive osseous lesion. IMPRESSION: 1. Multiple loops of proximal small bowel with marked wall thickening consistent with nonspecific infectious/inflammatory enteritis. 2. Distended loops of small bowel with  fecalization suggests ileus or partial obstruction. No distinct transition point is identified, and gas and stool is present to the rectum. 3. Bilateral nonobstructing renal stones as above. Aortic Atherosclerosis (ICD10-I70.0). Electronically Signed   By: PValetta MoleM.D.   On: 06/14/2021 14:17    EKG: Independently reviewed.  SR with PVCs, rate 98 bpm   Labs on Admission: I have personally reviewed the available labs and imaging studies at the time of the admission.  Pertinent labs: Sodium 134, chloride 94, potassium 2.8, calcium 7.7, albumin 3.6, blood glucose 155, lipase 23.     Assessment/Plan: Intractable nausea, vomiting, diarrhea likely secondary to viral gastroenteritis: The patient will be admitted to the medical/surgical floor.  Hemodynamically stable.  CT abdomen pelvis with contrast shows findings consistent with gastroenteritis with  possible ileus versus partial small bowel obstruction.  General consulted and made aware by myself.  They will see the patient in consultation tomorrow.  We will obtain stool studies, O&P and PCR.  Obtain COVID-19 PCR. Supportive care with IV fluids, analgesics and antiemetics as needed.  Low threshold to start NG tube with low intermittent suction if active vomiting starts again.  Severe hypokalemia: Replete with KCl IV  Mild hypocalcemia: Calcium gluconate 1 g IV x1  Mild hypovolemic hypochloremic hyponatremia: IV fluids as mentioned above  Coronary artery disease: Continue home Crestor  Dyslipidemia: Continue home Crestor  Chronic pain: Continue home buprenorphine and tramadol  Hypertension: Continue home amlodipine   Level of Care: MedSurg DVT prophylaxis: Lovenox subcu Code Status: Full code Consults: General surgery Admission status: Inpatient   Leslee Home DO [facility]   How to contact the [facility] Attending or Consulting provider [place] or covering provider during after hours [place], for this patient?  Check the  care team in [facility] and look for a) attending/consulting [facility] provider listed and b) the [facility] team listed Log into [email or web address] and use [facility]'s universal password to access. If you do not have the password, please contact the hospital operator. Locate the [facility] provider you are looking for under [facility] and page to a number that you can be directly reached. If you still have difficulty reaching the provider, please page the [facility] (Director on Call) for the Hospitalists listed on amion for assistance.   06/14/2021, 4:38 PM

## 2021-06-14 NOTE — Telephone Encounter (Signed)
Patient requesting appointment or recommendation due to vomiting and diarrhea since Monday. Patient reports he is now vomiting bile. I did advise patient that we are completely booked for today. And to please seek medical attention today at urgent care or ED. Patient agreed and will go to ED.

## 2021-06-14 NOTE — ED Notes (Signed)
RN informed bed assigned 

## 2021-06-14 NOTE — ED Triage Notes (Signed)
From home.  Arrives via [facility] for c/o abdominal pain since Saturday, N/V since Sunday.  Zofran given PTA.

## 2021-06-14 NOTE — ED Provider Notes (Signed)
[facility] Emergency Department Provider Note  ____________________________________________   Event Date/Time   First MD Initiated Contact with Patient 06/14/21 1250     (approximate)  I have reviewed the triage vital signs and the nursing notes.   HISTORY  Chief Complaint Abdominal Pain    HPI Shawn Crawford is a 60 y.o. male  with PMHx HTN, HLD, here with abdominal pain. Pt reports that for the past 1 week, he's had progressively worsening nausea, vomiting, and diarrhea. Sx started as mild aching, gnawing abdominal pain on Saturday. Starting Sunday, he began having profuse, NBNB emesis that has persisted and now turned dark green. He's had associated aching, gnawing, lower abdominal cramping. No alleviating factors. He has been unable to eat/drink. He has had associated weakness, lightheadedness w/ standing.        Past Medical History:  Diagnosis Date   Actinic keratosis    Dysplastic nevus 09/06/2014   Epigastric at mid line. Moderate to severe atypia, lateral margin involved. Excised 11/07/2014, margins free.   Dysplastic nevus 07/09/2015   Left lat. epigastric/costal margin. Moderate aypia, limited margins free.    Hx of cardiac catheterization    Hyperlipidemia    Hypertension     Patient Active Problem List   Diagnosis Date Noted   Special screening for malignant neoplasms, colon    Benign neoplasm of descending colon    Diverticulosis of large intestine without diverticulitis    Anal fissure s/p partial internal anal sphincterotomy 11/11/2018    Melanosis of colon    Internal hemorrhoids    Lumbar degenerative disc disease 04/06/2018   Piriformis muscle pain 04/06/2018   Chronic pain syndrome 10/22/2017   Chronic midline low back pain without sciatica 10/22/2017   Lumbar facet arthropathy 10/22/2017   Hyperglycemia 05/30/2015   Abnormal blood sugar 05/10/2015   Arteriosclerosis of coronary artery 05/10/2015   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   Acid reflux 05/10/2015   HLD (hyperlipidemia) 05/10/2015   BP (high blood pressure) 05/10/2015   Lumbar radiculopathy 05/10/2015   Avitaminosis D 05/10/2015   Benign essential HTN 05/07/2015    Past Surgical History:  Procedure Laterality Date   APPENDECTOMY     CARDIAC CATHETERIZATION     COLONOSCOPY WITH PROPOFOL N/A 06/24/2018   Procedure: COLONOSCOPY WITH PROPOFOL;  Surgeon: Virgel Manifold, MD;  Location: [facility] ENDOSCOPY;  Service: Endoscopy;  Laterality: N/A;   CORONARY ARTERY BYPASS GRAFT     x 3   ESOPHAGOGASTRODUODENOSCOPY (EGD) WITH PROPOFOL N/A 06/24/2018   Procedure: ESOPHAGOGASTRODUODENOSCOPY (EGD) WITH PROPOFOL;  Surgeon: Virgel Manifold, MD;  Location: [facility] ENDOSCOPY;  Service: Endoscopy;  Laterality: N/A;   EVALUATION UNDER ANESTHESIA WITH HEMORRHOIDECTOMY N/A 11/11/2018   Procedure: HEMORRHOIDAL LIGATION/PEXY, PARTIAL LATERAL SPINCTEROTOMY;  Surgeon: Michael Boston, MD;  Location: [facility] ORS;  Service: General;  Laterality: N/A;   HERNIA REPAIR      Prior to Admission medications   Medication Sig Start Date End Date Taking? Authorizing Provider  amLODipine (NORVASC) 5 MG tablet TAKE 1 TABLET BY MOUTH  DAILY 09/13/20   Jerrol Banana., MD  aspirin EC 81 MG tablet Take 81 mg by mouth daily.    [provider]  Buprenorphine HCl (BELBUCA) 600 MCG FILM Place 1 Film inside cheek every 12 (twelve) hours as needed. For chronic pain syndrome 06/08/21   Gillis Santa, MD  desonide (DESOWEN) 0.05 % ointment Apply topically 3 (three) times a week. Apply topically on Mondays, Wednesdays, and  Fridays. 06/13/20   Ralene Bathe, MD  ketoconazole (NIZORAL) 2 % cream Apply topically on Tuesdays, Thursdays, and Saturdays. 06/13/20   Ralene Bathe, MD  MULTIPLE VITAMINS PO Take 1 tablet by mouth daily.  10/22/11   [provider]  ondansetron (ZOFRAN ODT) 4 MG disintegrating tablet Take 1 tablet (4 mg total) by  mouth every 8 (eight) hours as needed for nausea or vomiting. 06/12/21   Montine Circle, PA-C  rosuvastatin (CRESTOR) 40 MG tablet TAKE 1 TABLET BY MOUTH  DAILY 02/20/21   Jerrol Banana., MD  tadalafil (CIALIS) 5 MG tablet Take 1 tablet (5 mg total) by mouth daily. 11/21/20   Jerrol Banana., MD  testosterone cypionate (DEPOTESTOSTERONE CYPIONATE) 200 MG/ML injection Inject 0.5 mLs (100 mg total) into the muscle every 14 (fourteen) days. Inject 5 cc IM every 2 weeks 12/14/20   Jerrol Banana., MD  traMADol (ULTRAM) 50 MG tablet Take 1 tablet (50 mg total) by mouth every 8 (eight) hours as needed for severe pain. Month last 30 days. 06/08/21 09/06/21  Gillis Santa, MD  zolpidem (AMBIEN CR) 12.5 MG CR tablet TAKE ONE TABLET BY MOUTH AT BEDTIME AS NEEDED FOR SLEEP. (STOP ZOLPIDEM 10 MG) 12/14/20   Jerrol Banana., MD    Allergies Penicillins  Family History  Problem Relation Age of Onset   Hyperlipidemia Mother    Diabetes Father    Hypertension Father    Thyroid cancer Sister    Colon cancer Sister     Social History Social History   Tobacco Use   Smoking status: Never   Smokeless tobacco: Never  Vaping Use   Vaping Use: Never used  Substance Use Topics   Alcohol use: Yes    Alcohol/week: 0.0 standard drinks    Comment: glass of wine occasionally   Drug use: No    Review of Systems  Review of Systems  Constitutional:  Positive for fatigue. Negative for chills and fever.  HENT:  Negative for sore throat.   Respiratory:  Negative for shortness of breath.   Cardiovascular:  Negative for chest pain.  Gastrointestinal:  Positive for abdominal pain, nausea and vomiting.  Genitourinary:  Negative for flank pain.  Musculoskeletal:  Negative for neck pain.  Skin:  Negative for rash and wound.  Allergic/Immunologic: Negative for immunocompromised state.  Neurological:  Negative for weakness and numbness.  Hematological:  Does not bruise/bleed easily.  All  other systems reviewed and are negative.   ____________________________________________  PHYSICAL EXAM:      VITAL SIGNS: ED Triage Vitals  Enc Vitals Group     BP 06/14/21 1214 128/78     Pulse Rate 06/14/21 1214 95     Resp 06/14/21 1214 14     Temp 06/14/21 1214 98.6 F (37 C)     Temp Source 06/14/21 1214 Oral     SpO2 06/14/21 1214 95 %     Weight 06/14/21 1209 141 lb (64 kg)     Height 06/14/21 1209 '5\' 11"'$  (1.803 m)     Head Circumference --      Peak Flow --      Pain Score 06/14/21 1208 3     Pain Loc --      Pain Edu? --      Excl. in [place]? --      Physical Exam Vitals and nursing note reviewed.  Constitutional:      General: He is not in acute distress.  Appearance: He is well-developed.  HENT:     Head: Normocephalic and atraumatic.     Mouth/Throat:     Comments: Dry MM Eyes:     Conjunctiva/sclera: Conjunctivae normal.  Cardiovascular:     Rate and Rhythm: Normal rate and regular rhythm.     Heart sounds: Normal heart sounds. No murmur heard.   No friction rub.  Pulmonary:     Effort: Pulmonary effort is normal. No respiratory distress.     Breath sounds: Normal breath sounds. No wheezing or rales.  Abdominal:     General: Bowel sounds are normal. There is no distension.     Palpations: Abdomen is soft.     Tenderness: There is generalized abdominal tenderness. There is no right CVA tenderness, left CVA tenderness, guarding or rebound. Negative signs include Murphy's sign and Rovsing's sign.  Musculoskeletal:     Cervical back: Neck supple.  Skin:    General: Skin is warm.     Capillary Refill: Capillary refill takes less than 2 seconds.  Neurological:     Mental Status: He is alert and oriented to person, place, and time.     Motor: No abnormal muscle tone.      ____________________________________________   LABS (all labs ordered are listed, but only abnormal results are displayed)  Labs Reviewed  COMPREHENSIVE METABOLIC PANEL -  Abnormal; Notable for the following components:      Result Value   Sodium 134 (*)    Potassium 2.8 (*)    Chloride 94 (*)    Glucose, Bld 155 (*)    BUN 25 (*)    Calcium 7.7 (*)    Total Protein 6.4 (*)    All other components within normal limits  CBC - Abnormal; Notable for the following components:   MCHC 36.4 (*)    All other components within normal limits  LIPASE, BLOOD  URINALYSIS, COMPLETE (UACMP) WITH MICROSCOPIC  TROPONIN I (HIGH SENSITIVITY)  TROPONIN I (HIGH SENSITIVITY)    ____________________________________________  EKG: Sinus rhythm with PVCs, RBBB. VR 98, PR 148, QRS 146, QTc 531. No acute st elevations.  RBBB old compared to prior. ________________________________________  RADIOLOGY All imaging, including plain films, CT scans, and ultrasounds, independently reviewed by me, and interpretations confirmed via formal radiology reads.  ED MD interpretation:   CT A/P: Enteritis, distended loops of SB consistent with possible partial obstruction or ileus  Official radiology report(s): CT ABDOMEN PELVIS W CONTRAST  Result Date: 06/14/2021 CLINICAL DATA:  Anterior low abdominal pain. EXAM: CT ABDOMEN AND PELVIS WITH CONTRAST TECHNIQUE: Multidetector CT imaging of the abdomen and pelvis was performed using the standard protocol following bolus administration of intravenous contrast. CONTRAST:  189m OMNIPAQUE IOHEXOL 350 MG/ML SOLN COMPARISON:  CT abdomen/pelvis 05/05/2018 FINDINGS: Lower chest: There is a calcified granuloma in the left lung base, unchanged. The lung bases are otherwise clear. Coronary artery calcifications are noted. The imaged heart is otherwise unremarkable. Hepatobiliary: The liver and gallbladder are unremarkable. There is no biliary ductal dilatation. Pancreas: Unremarkable. Spleen: There are numerous calcified granulomas throughout the spleen, unchanged. Adrenals/Urinary Tract: The adrenals are unremarkable. There is a 3-4 mm left upper pole renal  stone and a punctate right upper pole renal stone, new since 2019. There is a small cyst in the left interpolar region. There are no suspicious lesions. There is no hydronephrosis or hydroureter. The bladder is unremarkable. Stomach/Bowel: The stomach is decompressed, grossly unremarkable. There is wall thickening involving the proximal bowel most notably in  the fourth portion of the duodenum. There is moderate dilation of multiple loops of jejunum with the diameter measuring up to 4.5 cm. There is fecalization of material within multiple loops of small bowel. There is clumped decompressed small bowel in the pelvis, though the terminal ileum is not completely decompressed. Gas and stool is present to the rectum. There is no pneumatosis intestinalis. There is moderate stool throughout the colon. Vascular/Lymphatic: There is scattered calcified atherosclerotic plaque throughout the abdominal aorta. There is multifocal bulging without evidence of frank aneurysmal dilation. The major branch vessels are patent. The main portal and splenic veins are patent. There is no portal venous gas. There is no abdominal or pelvic lymphadenopathy. Reproductive: The prostate seminal vesicles are unremarkable. Other: There is no ascites or free air. Musculoskeletal: There is no acute osseous abnormality or aggressive osseous lesion. IMPRESSION: 1. Multiple loops of proximal small bowel with marked wall thickening consistent with nonspecific infectious/inflammatory enteritis. 2. Distended loops of small bowel with fecalization suggests ileus or partial obstruction. No distinct transition point is identified, and gas and stool is present to the rectum. 3. Bilateral nonobstructing renal stones as above. Aortic Atherosclerosis (ICD10-I70.0). Electronically Signed   By: Valetta Mole M.D.   On: 06/14/2021 14:17    ____________________________________________  PROCEDURES   Procedure(s) performed (including Critical  Care):  Procedures  ____________________________________________  INITIAL IMPRESSION / MDM / [place] / ED COURSE  As part of my medical decision making, I reviewed the following data within the [place] notes reviewed and incorporated, Old chart reviewed, Notes from prior ED visits, and [place] Controlled Substance Database       *Shawn Crawford was evaluated in Emergency Department on 06/14/2021 for the symptoms described in the history of present illness. He was evaluated in the context of the global COVID-19 pandemic, which necessitated consideration that the patient might be at risk for infection with the SARS-CoV-2 virus that causes COVID-19. Institutional protocols and algorithms that pertain to the evaluation of patients at risk for COVID-19 are in a state of rapid change based on information released by regulatory bodies including the CDC and federal and state organizations. These policies and algorithms were followed during the patient's care in the ED.  Some ED evaluations and interventions may be delayed as a result of limited staffing during the pandemic.*     Medical Decision Making:  60 yo M here with abdominal pain, n/v. H/o prior appendectomy c/b SBO. Pt appears dehydrated on exam but nontoxic. Labs show hypokalemia, elevated BUN/Cr ratio c/w dehydration. Lipase normal. WBC normal. CT a/p obtained, reviewed, shows diffuse infectious or inflammatory enteritis with distended loops of small bowel c/w ileus or partial obstruction. Will plan to admit for IVF, lyte replacement. No active vomiting currently, will hold on NG unless indicated.  ____________________________________________  FINAL CLINICAL IMPRESSION(S) / ED DIAGNOSES  Final diagnoses:  Small bowel obstruction (HCC)  Enteritis  Hypokalemia     MEDICATIONS GIVEN DURING THIS VISIT:  Medications  potassium chloride 10 mEq in 100 mL IVPB (10 mEq Intravenous New Bag/Given 06/14/21 1409)   morphine 4 MG/ML injection 4 mg (4 mg Intravenous Given 06/14/21 1327)  ondansetron (ZOFRAN) injection 4 mg (4 mg Intravenous Given 06/14/21 1326)  lactated ringers bolus 2,000 mL (1,000 mLs Intravenous New Bag/Given 06/14/21 1325)  iohexol (OMNIPAQUE) 350 MG/ML injection 100 mL (100 mLs Intravenous Contrast Given 06/14/21 1331)     ED Discharge Orders     None  Note:  This document was prepared using Dragon voice recognition software and may include unintentional dictation errors.   Duffy Bruce, MD 06/14/21 1430

## 2021-06-14 NOTE — ED Notes (Signed)
Lab contacted to add on troponin level to previous collection. Patient provided with urinal to obtain urine specimen.

## 2021-06-14 NOTE — ED Notes (Signed)
Family updated as to patient's status.

## 2021-06-15 ENCOUNTER — Inpatient Hospital Stay: Payer: 59

## 2021-06-15 ENCOUNTER — Encounter: Payer: Self-pay | Admitting: Family Medicine

## 2021-06-15 DIAGNOSIS — R112 Nausea with vomiting, unspecified: Secondary | ICD-10-CM

## 2021-06-15 DIAGNOSIS — R1084 Generalized abdominal pain: Secondary | ICD-10-CM

## 2021-06-15 DIAGNOSIS — K56609 Unspecified intestinal obstruction, unspecified as to partial versus complete obstruction: Secondary | ICD-10-CM | POA: Diagnosis not present

## 2021-06-15 LAB — GASTROINTESTINAL PANEL BY PCR, STOOL (REPLACES STOOL CULTURE)

## 2021-06-15 LAB — C DIFFICILE QUICK SCREEN W PCR REFLEX
C Diff antigen: NEGATIVE
C Diff interpretation: NOT DETECTED
C Diff toxin: NEGATIVE

## 2021-06-15 LAB — BASIC METABOLIC PANEL
Anion gap: 7 (ref 5–15)
BUN: 21 mg/dL (ref 8–23)
CO2: 33 mmol/L — ABNORMAL HIGH (ref 22–32)
Calcium: 8.1 mg/dL — ABNORMAL LOW (ref 8.9–10.3)
Chloride: 94 mmol/L — ABNORMAL LOW (ref 98–111)
Creatinine, Ser: 1.2 mg/dL (ref 0.61–1.24)
GFR, Estimated: >60 mL/min (ref >60)
Glucose, Bld: 93 mg/dL (ref 70–99)
Potassium: 3.6 mmol/L (ref 3.5–5.1)
Sodium: 134 mmol/L — ABNORMAL LOW (ref 135–145)

## 2021-06-15 LAB — CBC
HCT: 34.9 % — ABNORMAL LOW (ref 39.0–52.0)
Hemoglobin: 12 g/dL — ABNORMAL LOW (ref 13.0–17.0)
MCH: 30.1 pg (ref 26.0–34.0)
MCHC: 34.4 g/dL (ref 30.0–36.0)
MCV: 87.5 fL (ref 80.0–100.0)
Platelets: 145 10*3/uL — ABNORMAL LOW (ref 150–400)
RBC: 3.99 MIL/uL — ABNORMAL LOW (ref 4.22–5.81)
RDW: 12.1 % (ref 11.5–15.5)
WBC: 5.1 10*3/uL (ref 4.0–10.5)
nRBC: 0 % (ref 0.0–0.2)

## 2021-06-15 LAB — PHOSPHORUS: Phosphorus: 2.5 mg/dL (ref 2.5–4.6)

## 2021-06-15 LAB — MAGNESIUM: Magnesium: 2 mg/dL (ref 1.7–2.4)

## 2021-06-15 LAB — GLUCOSE, CAPILLARY: Glucose-Capillary: 83 mg/dL (ref 70–99)

## 2021-06-15 NOTE — TOC CM/SW Note (Signed)
CSW completed chart review.  No TOC needs identified. Please place [facility] consult if needs arise.  Oleh Genin, [place]

## 2021-06-15 NOTE — Plan of Care (Signed)
Continuing with plan of care. 

## 2021-06-15 NOTE — Consult Note (Signed)
[facility] SURGICAL CONSULTATION NOTE (initial) - [id number]   HISTORY OF PRESENT ILLNESS (HPI):  60 y.o. male presented to [facility] ED yesterday for evaluation of persistent nausea and vomiting. Patient reports about a week of nausea and vomiting, with associated loose stools and abdominal cramping.  Since he has been admitted although n.p.o., he denies any nausea/vomiting, he denies flatus and bowel movement.  He also denies fevers and chills.  He appears to have had his pain resolved as well.  Surgery is consulted by hospitalist physician Dr. Dwyane Dee in this context for evaluation and management possible partial small bowel obstruction.    PAST MEDICAL HISTORY (PMH):  Past Medical History:  Diagnosis Date   Actinic keratosis    Dysplastic nevus 09/06/2014   Epigastric at mid line. Moderate to severe atypia, lateral margin involved. Excised 11/07/2014, margins free.   Dysplastic nevus 07/09/2015   Left lat. epigastric/costal margin. Moderate aypia, limited margins free.    Hx of cardiac catheterization    Hyperlipidemia    Hypertension      PAST SURGICAL HISTORY ([place]):  Past Surgical History:  Procedure Laterality Date   APPENDECTOMY     CARDIAC CATHETERIZATION     COLONOSCOPY WITH PROPOFOL N/A 06/24/2018   Procedure: COLONOSCOPY WITH PROPOFOL;  Surgeon: Virgel Manifold, MD;  Location: [facility] ENDOSCOPY;  Service: Endoscopy;  Laterality: N/A;   CORONARY ARTERY BYPASS GRAFT     x 3   ESOPHAGOGASTRODUODENOSCOPY (EGD) WITH PROPOFOL N/A 06/24/2018   Procedure: ESOPHAGOGASTRODUODENOSCOPY (EGD) WITH PROPOFOL;  Surgeon: Virgel Manifold, MD;  Location: [facility] ENDOSCOPY;  Service: Endoscopy;  Laterality: N/A;   EVALUATION UNDER ANESTHESIA WITH HEMORRHOIDECTOMY N/A 11/11/2018   Procedure: HEMORRHOIDAL LIGATION/PEXY, PARTIAL LATERAL SPINCTEROTOMY;  Surgeon: Michael Boston, MD;  Location: [facility];  Service: General;  Laterality: N/A;   HERNIA REPAIR       MEDICATIONS:  Prior to  Admission medications   Medication Sig Start Date End Date Taking? Authorizing Provider  amLODipine (NORVASC) 5 MG tablet TAKE 1 TABLET BY MOUTH  DAILY 09/13/20  Yes Jerrol Banana., MD  aspirin EC 81 MG tablet Take 81 mg by mouth daily.   Yes [provider]  Buprenorphine HCl (BELBUCA) 600 MCG FILM Place 1 Film inside cheek every 12 (twelve) hours as needed. For chronic pain syndrome 06/08/21  Yes Gillis Santa, MD  MULTIPLE VITAMINS PO Take 1 tablet by mouth daily.  10/22/11  Yes [provider]  ondansetron (ZOFRAN ODT) 4 MG disintegrating tablet Take 1 tablet (4 mg total) by mouth every 8 (eight) hours as needed for nausea or vomiting. 06/12/21  Yes Montine Circle, PA-C  rosuvastatin (CRESTOR) 40 MG tablet TAKE 1 TABLET BY MOUTH  DAILY 02/20/21  Yes Jerrol Banana., MD  tadalafil (CIALIS) 5 MG tablet Take 1 tablet (5 mg total) by mouth daily. 11/21/20  Yes Jerrol Banana., MD  testosterone cypionate (DEPOTESTOSTERONE CYPIONATE) 200 MG/ML injection Inject 0.5 mLs (100 mg total) into the muscle every 14 (fourteen) days. Inject 5 cc IM every 2 weeks 12/14/20  Yes Jerrol Banana., MD  zolpidem (AMBIEN CR) 12.5 MG CR tablet TAKE ONE TABLET BY MOUTH AT BEDTIME AS NEEDED FOR SLEEP. (STOP ZOLPIDEM 10 MG) 12/14/20  Yes Jerrol Banana., MD  desonide (DESOWEN) 0.05 % ointment Apply topically 3 (three) times a week. Apply topically on Mondays, Wednesdays, and Fridays. 06/13/20   Ralene Bathe, MD  ketoconazole (NIZORAL) 2 % cream Apply topically  on Tuesdays, Thursdays, and Saturdays. 06/13/20   Ralene Bathe, MD  traMADol (ULTRAM) 50 MG tablet Take 1 tablet (50 mg total) by mouth every 8 (eight) hours as needed for severe pain. Month last 30 days. 06/08/21 09/06/21  Gillis Santa, MD     ALLERGIES:  Allergies  Allergen Reactions   Penicillins Itching, Other (See Comments) and Rash    Did it involve swelling of the face/tongue/throat, SOB, or low BP?  No Did it involve sudden or severe rash/hives, skin peeling, or any reaction on the inside of your mouth or nose? No Did you need to seek medical attention at a hospital or doctor's office? Yes When did it last happen? Childhood allergy If all above answers are "NO", may proceed with cephalosporin use.  Can take keflex with no problems      SOCIAL HISTORY:  Social History   Socioeconomic History   Marital status: Single    Spouse name: Not on file   Number of children: Not on file   Years of education: Not on file   Highest education level: Not on file  Occupational History   Not on file  Tobacco Use   Smoking status: Never   Smokeless tobacco: Never  Vaping Use   Vaping Use: Never used  Substance and Sexual Activity   Alcohol use: Yes    Alcohol/week: 0.0 standard drinks    Comment: glass of wine occasionally   Drug use: No   Sexual activity: Not on file  Other Topics Concern   Not on file  Social History Narrative   Not on file   Social Determinants of Health   Financial Resource Strain: Not on file  Food Insecurity: Not on file  Transportation Needs: Not on file  Physical Activity: Not on file  Stress: Not on file  Social Connections: Not on file  Intimate Partner Violence: Not on file     FAMILY HISTORY:  Family History  Problem Relation Age of Onset   Hyperlipidemia Mother    Diabetes Father    Hypertension Father    Thyroid cancer Sister    Colon cancer Sister       REVIEW OF SYSTEMS:  Review of Systems  Constitutional: Negative.   HENT: Negative.    Eyes: Negative.   Respiratory: Negative.    Cardiovascular: Negative.   Genitourinary: Negative.   Skin: Negative.   Psychiatric/Behavioral: Negative.    All other systems reviewed and are negative.  VITAL SIGNS:  Temp:  [97.5 F (36.4 C)-99.9 F (37.7 C)] 97.5 F (36.4 C) (09/17 0822) Pulse Rate:  [68-95] 68 (09/17 0822) Resp:  [12-18] 18 (09/17 0822) BP: (98-128)/(55-78) 112/68 (09/17  0822) SpO2:  [93 %-97 %] 97 % (09/17 0822) Weight:  [64 kg] 64 kg (09/16 1209)     Height: '5\' 11"'$  (180.3 cm) Weight: 64 kg BMI (Calculated): 19.67   INTAKE/OUTPUT:  09/16 0701 - 09/17 0700 In: 2.8 [I.V.:2.8] Out: 800 [Urine:800]  PHYSICAL EXAM:  Physical Exam Blood pressure 112/68, pulse 68, temperature (!) 97.5 F (36.4 C), resp. rate 18, height '5\' 11"'$  (1.803 m), weight 64 kg, SpO2 97 %. Last Weight  Most recent update: 06/14/2021 12:09 PM    Weight  64 kg (141 lb)             CONSTITUTIONAL: Well developed, and nourished, appropriately responsive and aware without distress.   EYES: Sclera non-icteric.   EARS, NOSE, MOUTH AND THROAT:  The oropharynx is clear. Oral mucosa is  pink and moist.    Hearing is intact to voice.  NECK: Trachea is midline, and there is no jugular venous distension.  LYMPH NODES:  Lymph nodes in the neck are not enlarged. RESPIRATORY:  Lungs are clear, and breath sounds are equal bilaterally. Normal respiratory effort without pathologic use of accessory muscles. CARDIOVASCULAR: Heart is regular in rate and rhythm. GI: The abdomen is notable for a right lower quadrant scar, otherwise soft, nontender, and nondistended. There were no palpable masses. I did not appreciate hepatosplenomegaly. There were hypoactive bowel sounds. MUSCULOSKELETAL:  Symmetrical muscle tone appreciated in all four extremities.    SKIN: Skin turgor is normal. No pathologic skin lesions appreciated.  NEUROLOGIC:  Motor and sensation appear grossly normal.  Cranial nerves are grossly without defect. PSYCH:  Alert and oriented to person, place and time. Affect is appropriate for situation.  Data Reviewed I have personally reviewed what is currently available of the patient's imaging, recent labs and medical records.    Labs:  CBC Latest Ref Rng & Units 06/15/2021 06/14/2021 04/18/2020  WBC 4.0 - 10.5 K/uL 5.1 5.3 6.4  Hemoglobin 13.0 - 17.0 g/dL 12.0(L) 15.6 14.4  Hematocrit 39.0 -  52.0 % 34.9(L) 42.8 42.0  Platelets 150 - 400 K/uL 145(L) 200 173   CMP Latest Ref Rng & Units 06/15/2021 06/14/2021 04/18/2020  Glucose 70 - 99 mg/dL 93 155(H) 127(H)  BUN 8 - 23 mg/dL 21 25(H) 13  Creatinine 0.61 - 1.24 mg/dL 1.20 1.11 0.99  Sodium 135 - 145 mmol/L 134(L) 134(L) 139  Potassium 3.5 - 5.1 mmol/L 3.6 2.8(L) 5.0  Chloride 98 - 111 mmol/L 94(L) 94(L) 100  CO2 22 - 32 mmol/L 33(H) 29 24  Calcium 8.9 - 10.3 mg/dL 8.1(L) 7.7(L) 9.6  Total Protein 6.5 - 8.1 g/dL - 6.4(L) 6.9  Total Bilirubin 0.3 - 1.2 mg/dL - 1.1 0.2  Alkaline Phos 38 - 126 U/L - 42 77  AST 15 - 41 U/L - 18 23  ALT 0 - 44 U/L - 19 16     Imaging studies:  Radiology review:  Last 24 hrs: DG Abd 1 View  Result Date: 06/15/2021 CLINICAL DATA:  Follow up small bowel obstruction. EXAM: ABDOMEN - 1 VIEW COMPARISON:  06/14/2021 CT FINDINGS: Distended small bowel loops have decreased in caliber from 06/14/2021 CT. UPPER limits of normal small bowel caliber loops are noted. Gas in the colon and rectum is present. No suspicious calcifications are identified. No acute bony abnormalities are noted. IMPRESSION: Improving small bowel dilatation since 06/14/2021 CT. Electronically Signed   By: Margarette Canada M.D.   On: 06/15/2021 10:47   CT ABDOMEN PELVIS W CONTRAST  Result Date: 06/14/2021 CLINICAL DATA:  Anterior low abdominal pain. EXAM: CT ABDOMEN AND PELVIS WITH CONTRAST TECHNIQUE: Multidetector CT imaging of the abdomen and pelvis was performed using the standard protocol following bolus administration of intravenous contrast. CONTRAST:  140m OMNIPAQUE IOHEXOL 350 MG/ML SOLN COMPARISON:  CT abdomen/pelvis 05/05/2018 FINDINGS: Lower chest: There is a calcified granuloma in the left lung base, unchanged. The lung bases are otherwise clear. Coronary artery calcifications are noted. The imaged heart is otherwise unremarkable. Hepatobiliary: The liver and gallbladder are unremarkable. There is no biliary ductal dilatation.  Pancreas: Unremarkable. Spleen: There are numerous calcified granulomas throughout the spleen, unchanged. Adrenals/Urinary Tract: The adrenals are unremarkable. There is a 3-4 mm left upper pole renal stone and a punctate right upper pole renal stone, new since 2019. There is a small cyst in  the left interpolar region. There are no suspicious lesions. There is no hydronephrosis or hydroureter. The bladder is unremarkable. Stomach/Bowel: The stomach is decompressed, grossly unremarkable. There is wall thickening involving the proximal bowel most notably in the fourth portion of the duodenum. There is moderate dilation of multiple loops of jejunum with the diameter measuring up to 4.5 cm. There is fecalization of material within multiple loops of small bowel. There is clumped decompressed small bowel in the pelvis, though the terminal ileum is not completely decompressed. Gas and stool is present to the rectum. There is no pneumatosis intestinalis. There is moderate stool throughout the colon. Vascular/Lymphatic: There is scattered calcified atherosclerotic plaque throughout the abdominal aorta. There is multifocal bulging without evidence of frank aneurysmal dilation. The major branch vessels are patent. The main portal and splenic veins are patent. There is no portal venous gas. There is no abdominal or pelvic lymphadenopathy. Reproductive: The prostate seminal vesicles are unremarkable. Other: There is no ascites or free air. Musculoskeletal: There is no acute osseous abnormality or aggressive osseous lesion. IMPRESSION: 1. Multiple loops of proximal small bowel with marked wall thickening consistent with nonspecific infectious/inflammatory enteritis. 2. Distended loops of small bowel with fecalization suggests ileus or partial obstruction. No distinct transition point is identified, and gas and stool is present to the rectum. 3. Bilateral nonobstructing renal stones as above. Aortic Atherosclerosis (ICD10-I70.0).  Electronically Signed   By: Valetta Mole M.D.   On: 06/14/2021 14:17     CLINICAL DATA:  Follow up small bowel obstruction.   EXAM: ABDOMEN - 1 VIEW COMPARISON:  06/14/2021 CT FINDINGS: Distended small bowel loops have decreased in caliber from 06/14/2021 CT.   UPPER limits of normal small bowel caliber loops are noted.   Gas in the colon and rectum is present.   No suspicious calcifications are identified.   No acute bony abnormalities are noted.   IMPRESSION: Improving small bowel dilatation since 06/14/2021 CT.  Electronically Signed   By: Margarette Canada M.D.   On: 06/15/2021 10:47 Assessment/Plan:  60 y.o. male with potential partial small bowel obstruction, other etiologies for radiographic findings likely, complicated by pertinent comorbidities including:  Patient Active Problem List   Diagnosis Date Noted   SBO (small bowel obstruction) ([place]) 06/14/2021   Special screening for malignant neoplasms, colon    Benign neoplasm of descending colon    Diverticulosis of large intestine without diverticulitis    Anal fissure s/p partial internal anal sphincterotomy 11/11/2018    Melanosis of colon    Internal hemorrhoids    Lumbar degenerative disc disease 04/06/2018   Piriformis muscle pain 04/06/2018   Chronic pain syndrome 10/22/2017   Chronic midline low back pain without sciatica 10/22/2017   Lumbar facet arthropathy 10/22/2017   Hyperglycemia 05/30/2015   Abnormal blood sugar 05/10/2015   Arteriosclerosis of coronary artery 05/10/2015   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  -We will assess progress with serial examinations, continue IV support should clear liquids not be tolerated.  -We will  follow with you.  - DVT prophylaxis  All of the above findings and recommendations were discussed with the patient , and all of patient's questions were answered to their expressed satisfaction.  Thank  you for the opportunity to participate in this patient's care.   -- [name], M.D., FACS 06/15/2021, 11:12 AM

## 2021-06-15 NOTE — Progress Notes (Addendum)
PROGRESS NOTE    Shawn Crawford  [id number] DOB: 1961-09-27 DOA: 06/14/2021 PCP: Jerrol Banana., MD   Brief Narrative:  This 60 years old male with PMH significant for hypertension, hyperlipidemia, coronary artery disease s/p CABG, history of appendicectomy, chronic pain on buprenorphine  presents in the ED with complaints of abdominal pain associated with nausea, vomiting and diarrhea.  Patient reported he had low-grade fever followed by nonbloody nonbilious vomiting and later he developed diarrhea and abdominal pain.  He denies any recent travel, sick contacts. He is fully vaccinated for COVID.  In the ED CT of abdomen and pelvis showed findings consistent with gastroenteritis with possible ileus versus partial small bowel obstruction.  Patient is admitted for small bowel obstruction /ileus, Kept NPO,  IV fluids.  Lipase normal.  Continued on IV fluids for electrolyte abnormalities.  General surgery consulted.  Assessment & Plan:   Active Problems:   SBO (small bowel obstruction) (HCC)  Abdominal pain, nausea, vomiting, diarrhea likely secondary to viral gastroenteritis: Patient presented with mild abdominal pain,  nausea ,vomiting and diarrhea. CT abdomen showed findings consistent with viral gastroenteritis with possible ileus versus partial small bowel obstruction. Lipase normal, LFTs normal. C. difficile and GI panel negative. Continue IV hydration,  NPO,  IV fluids. Repeat x-ray abdomen shows improving small bowel obstruction. Patient reports he has not had a bowel movement yet,  reported nausea and vomiting has improved. Low threshold for NG tube since abdominal pain is improving and sluggish bowel sounds. General surgery consulted,  awaiting recommendation.  Severe hypokalemia: Replaced, continue to monitor.  Hypocalcemia : Patient was given calcium gluconate X 1.   Continue to monitor.  Dyslipidemia: Continue Crestor  Hypertension: Continue  amlodipine  Chronic pain syndrome: Continue buprenorphine and tramadol  CAD: Continue aspirin and Crestor. Patient denies any chest pain.    DVT prophylaxis: Lovenox Code Status: Full code. Family Communication: No family at bed side. Disposition Plan:    Status is: Inpatient  Remains inpatient appropriate because:Inpatient level of care appropriate due to severity of illness  Dispo: The patient is from: Home              Anticipated d/c is to: Home              Patient currently is not medically stable to d/c.   Difficult to place patient No   Consultants:  General surgery  Procedures: CT abdomen. Antimicrobials:  Anti-infectives (From admission, onward)    None       Subjective: Patient was seen and examined at bedside.  Overnight events noted. Patient reports abdominal pain is better, reported nausea and vomiting has resolved. He has not had a bowel movement yet.  Objective: Vitals:   06/14/21 2000 06/14/21 2149 06/15/21 0329 06/15/21 0822  BP: 109/67 116/67 (!) 98/55 112/68  Pulse: 82 82 71 68  Resp: '16 16 16 18  '$ Temp:  99.9 F (37.7 C) 98.6 F (37 C) (!) 97.5 F (36.4 C)  TempSrc:  Oral Oral   SpO2: 94% 94% 95% 97%  Weight:      Height:        Intake/Output Summary (Last 24 hours) at 06/15/2021 1142 Last data filed at 06/15/2021 0507 Gross per 24 hour  Intake 2.84 ml  Output 800 ml  Net -797.16 ml   Filed Weights   06/14/21 1209  Weight: 64 kg    Examination:  General exam: Appears comfortable, not in any acute distress. Respiratory system: Clear to  auscultation bilaterally, no wheezing. Cardiovascular system: S1-S2 heard, regular rate and rhythm, no murmur. Gastrointestinal system: Abdomen is soft, nondistended, soreness+ , no guarding, BS sluggish.   Central nervous system: Alert and oriented x 3. No focal neurological deficits. Extremities: No edema, no cyanosis, no clubbing. Skin: No rashes, lesions or ulcers Psychiatry: Judgement  and insight appear normal. Mood & affect appropriate.     Data Reviewed: I have personally reviewed following labs and imaging studies  CBC: Recent Labs  Lab 06/14/21 1218 06/15/21 0500  WBC 5.3 5.1  HGB 15.6 12.0*  HCT 42.8 34.9*  MCV 86.1 87.5  PLT 200 [id number]*   Basic Metabolic Panel: Recent Labs  Lab 06/14/21 1218 06/14/21 1525 06/15/21 0500  NA 134*  --  134*  K 2.8*  --  3.6  CL 94*  --  94*  CO2 29  --  33*  GLUCOSE 155*  --  93  BUN 25*  --  21  CREATININE 1.11  --  1.20  CALCIUM 7.7*  --  8.1*  MG  --  2.3 2.0  PHOS  --  3.8 2.5   GFR: Estimated Creatinine Clearance: 58.5 mL/min (by C-G formula based on SCr of 1.2 mg/dL). Liver Function Tests: Recent Labs  Lab 06/14/21 1218  AST 18  ALT 19  ALKPHOS 42  BILITOT 1.1  PROT 6.4*  ALBUMIN 3.6   Recent Labs  Lab 06/14/21 1218  LIPASE 23   No results for input(s): AMMONIA in the last 168 hours. Coagulation Profile: No results for input(s): INR, PROTIME in the last 168 hours. Cardiac Enzymes: No results for input(s): CKTOTAL, CKMB, CKMBINDEX, TROPONINI in the last 168 hours. BNP (last 3 results) No results for input(s): PROBNP in the last 8760 hours. HbA1C: No results for input(s): HGBA1C in the last 72 hours. CBG: Recent Labs  Lab 06/15/21 0840  GLUCAP 83   Lipid Profile: No results for input(s): CHOL, HDL, LDLCALC, TRIG, CHOLHDL, LDLDIRECT in the last 72 hours. Thyroid Function Tests: No results for input(s): TSH, T4TOTAL, FREET4, T3FREE, THYROIDAB in the last 72 hours. Anemia Panel: No results for input(s): VITAMINB12, FOLATE, FERRITIN, TIBC, IRON, RETICCTPCT in the last 72 hours. Sepsis Labs: No results for input(s): PROCALCITON, LATICACIDVEN in the last 168 hours.  Recent Results (from the past 240 hour(s))  Gastrointestinal Panel by PCR , Stool     Status: None   Collection Time: 06/14/21 12:45 PM   Specimen: STOOL  Result Value Ref Range Status   Campylobacter species NOT DETECTED  NOT DETECTED Final   Plesimonas shigelloides NOT DETECTED NOT DETECTED Final   Salmonella species NOT DETECTED NOT DETECTED Final   Yersinia enterocolitica NOT DETECTED NOT DETECTED Final   Vibrio species NOT DETECTED NOT DETECTED Final   Vibrio cholerae NOT DETECTED NOT DETECTED Final   Enteroaggregative E coli (EAEC) NOT DETECTED NOT DETECTED Final   Enteropathogenic E coli (EPEC) NOT DETECTED NOT DETECTED Final   Enterotoxigenic E coli (ETEC) NOT DETECTED NOT DETECTED Final   Shiga like toxin producing E coli (STEC) NOT DETECTED NOT DETECTED Final   Shigella/Enteroinvasive E coli (EIEC) NOT DETECTED NOT DETECTED Final   Cryptosporidium NOT DETECTED NOT DETECTED Final   Cyclospora cayetanensis NOT DETECTED NOT DETECTED Final   Entamoeba histolytica NOT DETECTED NOT DETECTED Final   Giardia lamblia NOT DETECTED NOT DETECTED Final   Adenovirus F40/41 NOT DETECTED NOT DETECTED Final   Astrovirus NOT DETECTED NOT DETECTED Final   Norovirus GI/GII NOT DETECTED NOT  DETECTED Final   Rotavirus A NOT DETECTED NOT DETECTED Final   Sapovirus (I, II, IV, and V) NOT DETECTED NOT DETECTED Final    Comment: Performed at [facility], [place], [place] [address]  C Difficile Quick Screen w PCR reflex     Status: None   Collection Time: 06/14/21 12:45 PM   Specimen: STOOL  Result Value Ref Range Status   C Diff antigen NEGATIVE NEGATIVE Final   C Diff toxin NEGATIVE NEGATIVE Final   C Diff interpretation No C. difficile detected.  Final    Comment: Performed at [facility], [address] [place], [place] [address]  Resp Panel by RT-PCR (Flu A&B, Covid) Nasopharyngeal Swab     Status: None   Collection Time: 06/14/21  6:09 PM   Specimen: Nasopharyngeal Swab; Nasopharyngeal(NP) swabs in vial transport medium  Result Value Ref Range Status   SARS Coronavirus 2 by RT PCR NEGATIVE NEGATIVE Final    Comment: (NOTE) SARS-CoV-2 target nucleic acids are NOT  DETECTED.  The SARS-CoV-2 RNA is generally detectable in upper respiratory specimens during the acute phase of infection. The lowest concentration of SARS-CoV-2 viral copies this assay can detect is 138 copies/mL. A negative result does not preclude SARS-Cov-2 infection and should not be used as the sole basis for treatment or other patient management decisions. A negative result may occur with  improper specimen collection/handling, submission of specimen other than nasopharyngeal swab, presence of viral mutation(s) within the areas targeted by this assay, and inadequate number of viral copies(<138 copies/mL). A negative result must be combined with clinical observations, patient history, and epidemiological information. The expected result is Negative.  Fact Sheet for Patients:  EntrepreneurPulse.com.au  Fact Sheet for Healthcare Providers:  IncredibleEmployment.be  This test is no t yet approved or cleared by the [place] FDA and  has been authorized for detection and/or diagnosis of SARS-CoV-2 by FDA under an Emergency Use Authorization (EUA). This EUA will remain  in effect (meaning this test can be used) for the duration of the COVID-19 declaration under Section 564(b)(1) of the Act, 21 U.S.C.section 360bbb-3(b)(1), unless the authorization is terminated  or revoked sooner.       Influenza A by PCR NEGATIVE NEGATIVE Final   Influenza B by PCR NEGATIVE NEGATIVE Final    Comment: (NOTE) The Xpert Xpress SARS-CoV-2/FLU/RSV plus assay is intended as an aid in the diagnosis of influenza from Nasopharyngeal swab specimens and should not be used as a sole basis for treatment. Nasal washings and aspirates are unacceptable for Xpert Xpress SARS-CoV-2/FLU/RSV testing.  Fact Sheet for Patients: EntrepreneurPulse.com.au  Fact Sheet for Healthcare Providers: IncredibleEmployment.be  This test is not yet  approved or cleared by the [place] FDA and has been authorized for detection and/or diagnosis of SARS-CoV-2 by FDA under an Emergency Use Authorization (EUA). This EUA will remain in effect (meaning this test can be used) for the duration of the COVID-19 declaration under Section 564(b)(1) of the Act, 21 U.S.C. section 360bbb-3(b)(1), unless the authorization is terminated or revoked.  Performed at [facility], [address], [place],  [address]      Radiology Studies: DG Abd 1 View  Result Date: 06/15/2021 CLINICAL DATA:  Follow up small bowel obstruction. EXAM: ABDOMEN - 1 VIEW COMPARISON:  06/14/2021 CT FINDINGS: Distended small bowel loops have decreased in caliber from 06/14/2021 CT. UPPER limits of normal small bowel caliber loops are noted. Gas in the colon and rectum is  present. No suspicious calcifications are identified. No acute bony abnormalities are noted. IMPRESSION: Improving small bowel dilatation since 06/14/2021 CT. Electronically Signed   By: Margarette Canada M.D.   On: 06/15/2021 10:47   CT ABDOMEN PELVIS W CONTRAST  Result Date: 06/14/2021 CLINICAL DATA:  Anterior low abdominal pain. EXAM: CT ABDOMEN AND PELVIS WITH CONTRAST TECHNIQUE: Multidetector CT imaging of the abdomen and pelvis was performed using the standard protocol following bolus administration of intravenous contrast. CONTRAST:  168m OMNIPAQUE IOHEXOL 350 MG/ML SOLN COMPARISON:  CT abdomen/pelvis 05/05/2018 FINDINGS: Lower chest: There is a calcified granuloma in the left lung base, unchanged. The lung bases are otherwise clear. Coronary artery calcifications are noted. The imaged heart is otherwise unremarkable. Hepatobiliary: The liver and gallbladder are unremarkable. There is no biliary ductal dilatation. Pancreas: Unremarkable. Spleen: There are numerous calcified granulomas throughout the spleen, unchanged. Adrenals/Urinary Tract: The adrenals are unremarkable. There is a 3-4 mm left  upper pole renal stone and a punctate right upper pole renal stone, new since 2019. There is a small cyst in the left interpolar region. There are no suspicious lesions. There is no hydronephrosis or hydroureter. The bladder is unremarkable. Stomach/Bowel: The stomach is decompressed, grossly unremarkable. There is wall thickening involving the proximal bowel most notably in the fourth portion of the duodenum. There is moderate dilation of multiple loops of jejunum with the diameter measuring up to 4.5 cm. There is fecalization of material within multiple loops of small bowel. There is clumped decompressed small bowel in the pelvis, though the terminal ileum is not completely decompressed. Gas and stool is present to the rectum. There is no pneumatosis intestinalis. There is moderate stool throughout the colon. Vascular/Lymphatic: There is scattered calcified atherosclerotic plaque throughout the abdominal aorta. There is multifocal bulging without evidence of frank aneurysmal dilation. The major branch vessels are patent. The main portal and splenic veins are patent. There is no portal venous gas. There is no abdominal or pelvic lymphadenopathy. Reproductive: The prostate seminal vesicles are unremarkable. Other: There is no ascites or free air. Musculoskeletal: There is no acute osseous abnormality or aggressive osseous lesion. IMPRESSION: 1. Multiple loops of proximal small bowel with marked wall thickening consistent with nonspecific infectious/inflammatory enteritis. 2. Distended loops of small bowel with fecalization suggests ileus or partial obstruction. No distinct transition point is identified, and gas and stool is present to the rectum. 3. Bilateral nonobstructing renal stones as above. Aortic Atherosclerosis (ICD10-I70.0). Electronically Signed   By: PValetta MoleM.D.   On: 06/14/2021 14:17     Scheduled Meds:  amLODipine  5 mg Oral Daily   aspirin EC  81 mg Oral Daily   enoxaparin (LOVENOX)  injection  40 mg Subcutaneous Q24H   influenza vac split quadrivalent PF  0.5 mL Intramuscular Tomorrow-1000   multivitamin with minerals  1 tablet Oral Daily   rosuvastatin  40 mg Oral Daily   Continuous Infusions:  lactated ringers 100 mL/hr at 06/15/21 0456     LOS: 1 day    Time spent: 35 mins    Chadrick Sprinkle, MD [facility]   If 7PM-7AM, please contact night-coverage

## 2021-06-16 DIAGNOSIS — K56609 Unspecified intestinal obstruction, unspecified as to partial versus complete obstruction: Secondary | ICD-10-CM | POA: Diagnosis not present

## 2021-06-16 LAB — GLUCOSE, CAPILLARY: Glucose-Capillary: 103 mg/dL — ABNORMAL HIGH (ref 70–99)

## 2021-06-16 MED ORDER — POLYETHYLENE GLYCOL 3350 17 G PO PACK
17.0000 g | PACK | Freq: Every day | ORAL | Status: DC
  Administered 2021-06-16 – 2021-06-17 (×2): 17 g via ORAL
  Filled 2021-06-16 (×2): qty 1

## 2021-06-16 NOTE — Progress Notes (Signed)
PROGRESS NOTE    Shawn Crawford  [id number] DOB: 04-07-1961 DOA: 06/14/2021 PCP: Jerrol Banana., MD   Brief Narrative:  This 60 years old male with PMH significant for hypertension, hyperlipidemia, coronary artery disease s/p CABG, history of appendicectomy, chronic pain on buprenorphine  presents in the ED with complaints of abdominal pain associated with nausea, vomiting and diarrhea.  Patient reported he had low-grade fever followed by nonbloody nonbilious vomiting and later he developed diarrhea and abdominal pain.  He denies any recent travel, sick contacts. He is fully vaccinated for COVID.  In the ED CT of abdomen and pelvis showed findings consistent with gastroenteritis with possible ileus versus partial small bowel obstruction.  Patient is admitted for small bowel obstruction /ileus, Kept NPO,  IV fluids.  Lipase normal.  Continued on IV fluids for electrolyte abnormalities.  General surgery consulted.  Assessment & Plan:   Active Problems:   SBO (small bowel obstruction) (HCC)  Abdominal pain, nausea, vomiting, diarrhea likely secondary to viral gastroenteritis: Patient presented with mild abdominal pain,  nausea ,vomiting and diarrhea. CT abdomen showed findings consistent with viral gastroenteritis with possible ileus versus partial small bowel obstruction. Lipase normal, LFTs normal. C. difficile and GI panel negative. Continue IV hydration,  NPO,  IV fluids. Repeat x-ray abdomen shows improving small bowel obstruction. Patient reports he has not had a bowel movement yet,  reported nausea and vomiting has improved. Low threshold for NG tube since abdominal pain is improving and sluggish bowel sounds. General surgery consulted,  started on clear liquid diet.  He tolerated well. Abdominal pain has improved,  he is able to pass flatus.  Advance diet to full liquids to soft. Anticipated discharge home tomorrow if he tolerates soft bland diet.  Severe  hypokalemia: Replaced and resolved.  Hypocalcemia : Patient was given calcium gluconate X 1.   Replaced and resolved.  Dyslipidemia: Continue Crestor  Hypertension: Continue amlodipine  Chronic pain syndrome: Continue buprenorphine and tramadol  CAD: Continue aspirin and Crestor. Patient denies any chest pain.    DVT prophylaxis: Lovenox Code Status: Full code. Family Communication: No family at bed side. Disposition Plan:    Status is: Inpatient  Remains inpatient appropriate because:Inpatient level of care appropriate due to severity of illness  Dispo: The patient is from: Home              Anticipated d/c is to: Home              Patient currently is not medically stable to d/c.   Difficult to place patient No   Consultants:  General surgery  Procedures: CT abdomen. Antimicrobials:  Anti-infectives (From admission, onward)    None       Subjective: Patient was seen and examined at bedside.  Overnight events noted. Patient reports abdominal pain has resolved. Nausea and vomiting has also resolved.   He is able to pass flatus and has not had a bowel movement yet.  Tolerating clear liquid diet.   Objective: Vitals:   06/15/21 1559 06/15/21 2015 06/16/21 0525 06/16/21 0804  BP: (!) 105/56 110/65 111/61 113/62  Pulse: 70 75 72 71  Resp: '16 16 16 16  '$ Temp: 97.7 F (36.5 C)  98 F (36.7 C) 97.7 F (36.5 C)  TempSrc:   Oral Oral  SpO2: 93% 99% 97% 99%  Weight:      Height:        Intake/Output Summary (Last 24 hours) at 06/16/2021 1149 Last data filed at  06/16/2021 0800 Gross per 24 hour  Intake 1026.97 ml  Output 1800 ml  Net -773.03 ml   Filed Weights   06/14/21 1209  Weight: 64 kg    Examination:  General exam: Appears comfortable, not in any acute distress.  Appears deconditioned. Respiratory system: Clear to auscultation bilaterally, no wheezing. Cardiovascular system: S1-S2 heard, regular rate and rhythm, no  murmur. Gastrointestinal system: Abdomen is soft, nontender, nondistended, BS+ Central nervous system: Alert and oriented x 3. No focal neurological deficits. Extremities: No edema, no cyanosis, no clubbing. Skin: No rashes, lesions or ulcers Psychiatry: Judgement and insight appear normal. Mood & affect appropriate.     Data Reviewed: I have personally reviewed following labs and imaging studies  CBC: Recent Labs  Lab 06/14/21 1218 06/15/21 0500  WBC 5.3 5.1  HGB 15.6 12.0*  HCT 42.8 34.9*  MCV 86.1 87.5  PLT 200 [id number]*   Basic Metabolic Panel: Recent Labs  Lab 06/14/21 1218 06/14/21 1525 06/15/21 0500  NA 134*  --  134*  K 2.8*  --  3.6  CL 94*  --  94*  CO2 29  --  33*  GLUCOSE 155*  --  93  BUN 25*  --  21  CREATININE 1.11  --  1.20  CALCIUM 7.7*  --  8.1*  MG  --  2.3 2.0  PHOS  --  3.8 2.5   GFR: Estimated Creatinine Clearance: 58.5 mL/min (by C-G formula based on SCr of 1.2 mg/dL). Liver Function Tests: Recent Labs  Lab 06/14/21 1218  AST 18  ALT 19  ALKPHOS 42  BILITOT 1.1  PROT 6.4*  ALBUMIN 3.6   Recent Labs  Lab 06/14/21 1218  LIPASE 23   No results for input(s): AMMONIA in the last 168 hours. Coagulation Profile: No results for input(s): INR, PROTIME in the last 168 hours. Cardiac Enzymes: No results for input(s): CKTOTAL, CKMB, CKMBINDEX, TROPONINI in the last 168 hours. BNP (last 3 results) No results for input(s): PROBNP in the last 8760 hours. HbA1C: No results for input(s): HGBA1C in the last 72 hours. CBG: Recent Labs  Lab 06/15/21 0840 06/16/21 0802  GLUCAP 83 103*   Lipid Profile: No results for input(s): CHOL, HDL, LDLCALC, TRIG, CHOLHDL, LDLDIRECT in the last 72 hours. Thyroid Function Tests: No results for input(s): TSH, T4TOTAL, FREET4, T3FREE, THYROIDAB in the last 72 hours. Anemia Panel: No results for input(s): VITAMINB12, FOLATE, FERRITIN, TIBC, IRON, RETICCTPCT in the last 72 hours. Sepsis Labs: No results for  input(s): PROCALCITON, LATICACIDVEN in the last 168 hours.  Recent Results (from the past 240 hour(s))  Gastrointestinal Panel by PCR , Stool     Status: None   Collection Time: 06/14/21 12:45 PM   Specimen: STOOL  Result Value Ref Range Status   Campylobacter species NOT DETECTED NOT DETECTED Final   Plesimonas shigelloides NOT DETECTED NOT DETECTED Final   Salmonella species NOT DETECTED NOT DETECTED Final   Yersinia enterocolitica NOT DETECTED NOT DETECTED Final   Vibrio species NOT DETECTED NOT DETECTED Final   Vibrio cholerae NOT DETECTED NOT DETECTED Final   Enteroaggregative E coli (EAEC) NOT DETECTED NOT DETECTED Final   Enteropathogenic E coli (EPEC) NOT DETECTED NOT DETECTED Final   Enterotoxigenic E coli (ETEC) NOT DETECTED NOT DETECTED Final   Shiga like toxin producing E coli (STEC) NOT DETECTED NOT DETECTED Final   Shigella/Enteroinvasive E coli (EIEC) NOT DETECTED NOT DETECTED Final   Cryptosporidium NOT DETECTED NOT DETECTED Final   Cyclospora  cayetanensis NOT DETECTED NOT DETECTED Final   Entamoeba histolytica NOT DETECTED NOT DETECTED Final   Giardia lamblia NOT DETECTED NOT DETECTED Final   Adenovirus F40/41 NOT DETECTED NOT DETECTED Final   Astrovirus NOT DETECTED NOT DETECTED Final   Norovirus GI/GII NOT DETECTED NOT DETECTED Final   Rotavirus A NOT DETECTED NOT DETECTED Final   Sapovirus (I, II, IV, and V) NOT DETECTED NOT DETECTED Final    Comment: Performed at [facility], [address], [place], [place] [address]  C Difficile Quick Screen w PCR reflex     Status: None   Collection Time: 06/14/21 12:45 PM   Specimen: STOOL  Result Value Ref Range Status   C Diff antigen NEGATIVE NEGATIVE Final   C Diff toxin NEGATIVE NEGATIVE Final   C Diff interpretation No C. difficile detected.  Final    Comment: Performed at [facility], [address] [place], [place] [address]  Resp Panel by RT-PCR (Flu A&B, Covid) Nasopharyngeal Swab      Status: None   Collection Time: 06/14/21  6:09 PM   Specimen: Nasopharyngeal Swab; Nasopharyngeal(NP) swabs in vial transport medium  Result Value Ref Range Status   SARS Coronavirus 2 by RT PCR NEGATIVE NEGATIVE Final    Comment: (NOTE) SARS-CoV-2 target nucleic acids are NOT DETECTED.  The SARS-CoV-2 RNA is generally detectable in upper respiratory specimens during the acute phase of infection. The lowest concentration of SARS-CoV-2 viral copies this assay can detect is 138 copies/mL. A negative result does not preclude SARS-Cov-2 infection and should not be used as the sole basis for treatment or other patient management decisions. A negative result may occur with  improper specimen collection/handling, submission of specimen other than nasopharyngeal swab, presence of viral mutation(s) within the areas targeted by this assay, and inadequate number of viral copies(<138 copies/mL). A negative result must be combined with clinical observations, patient history, and epidemiological information. The expected result is Negative.  Fact Sheet for Patients:  EntrepreneurPulse.com.au  Fact Sheet for Healthcare Providers:  IncredibleEmployment.be  This test is no t yet approved or cleared by the [place] FDA and  has been authorized for detection and/or diagnosis of SARS-CoV-2 by FDA under an Emergency Use Authorization (EUA). This EUA will remain  in effect (meaning this test can be used) for the duration of the COVID-19 declaration under Section 564(b)(1) of the Act, 21 U.S.C.section 360bbb-3(b)(1), unless the authorization is terminated  or revoked sooner.       Influenza A by PCR NEGATIVE NEGATIVE Final   Influenza B by PCR NEGATIVE NEGATIVE Final    Comment: (NOTE) The Xpert Xpress SARS-CoV-2/FLU/RSV plus assay is intended as an aid in the diagnosis of influenza from Nasopharyngeal swab specimens and should not be used as a sole basis  for treatment. Nasal washings and aspirates are unacceptable for Xpert Xpress SARS-CoV-2/FLU/RSV testing.  Fact Sheet for Patients: EntrepreneurPulse.com.au  Fact Sheet for Healthcare Providers: IncredibleEmployment.be  This test is not yet approved or cleared by the [place] FDA and has been authorized for detection and/or diagnosis of SARS-CoV-2 by FDA under an Emergency Use Authorization (EUA). This EUA will remain in effect (meaning this test can be used) for the duration of the COVID-19 declaration under Section 564(b)(1) of the Act, 21 U.S.C. section 360bbb-3(b)(1), unless the authorization is terminated or revoked.  Performed at [facility], [address], [place], [place] [address]      Radiology Studies: DG Abd 1 View  Result  Date: 06/15/2021 CLINICAL DATA:  Follow up small bowel obstruction. EXAM: ABDOMEN - 1 VIEW COMPARISON:  06/14/2021 CT FINDINGS: Distended small bowel loops have decreased in caliber from 06/14/2021 CT. UPPER limits of normal small bowel caliber loops are noted. Gas in the colon and rectum is present. No suspicious calcifications are identified. No acute bony abnormalities are noted. IMPRESSION: Improving small bowel dilatation since 06/14/2021 CT. Electronically Signed   By: Margarette Canada M.D.   On: 06/15/2021 10:47   CT ABDOMEN PELVIS W CONTRAST  Result Date: 06/14/2021 CLINICAL DATA:  Anterior low abdominal pain. EXAM: CT ABDOMEN AND PELVIS WITH CONTRAST TECHNIQUE: Multidetector CT imaging of the abdomen and pelvis was performed using the standard protocol following bolus administration of intravenous contrast. CONTRAST:  124m OMNIPAQUE IOHEXOL 350 MG/ML SOLN COMPARISON:  CT abdomen/pelvis 05/05/2018 FINDINGS: Lower chest: There is a calcified granuloma in the left lung base, unchanged. The lung bases are otherwise clear. Coronary artery calcifications are noted. The imaged heart is otherwise  unremarkable. Hepatobiliary: The liver and gallbladder are unremarkable. There is no biliary ductal dilatation. Pancreas: Unremarkable. Spleen: There are numerous calcified granulomas throughout the spleen, unchanged. Adrenals/Urinary Tract: The adrenals are unremarkable. There is a 3-4 mm left upper pole renal stone and a punctate right upper pole renal stone, new since 2019. There is a small cyst in the left interpolar region. There are no suspicious lesions. There is no hydronephrosis or hydroureter. The bladder is unremarkable. Stomach/Bowel: The stomach is decompressed, grossly unremarkable. There is wall thickening involving the proximal bowel most notably in the fourth portion of the duodenum. There is moderate dilation of multiple loops of jejunum with the diameter measuring up to 4.5 cm. There is fecalization of material within multiple loops of small bowel. There is clumped decompressed small bowel in the pelvis, though the terminal ileum is not completely decompressed. Gas and stool is present to the rectum. There is no pneumatosis intestinalis. There is moderate stool throughout the colon. Vascular/Lymphatic: There is scattered calcified atherosclerotic plaque throughout the abdominal aorta. There is multifocal bulging without evidence of frank aneurysmal dilation. The major branch vessels are patent. The main portal and splenic veins are patent. There is no portal venous gas. There is no abdominal or pelvic lymphadenopathy. Reproductive: The prostate seminal vesicles are unremarkable. Other: There is no ascites or free air. Musculoskeletal: There is no acute osseous abnormality or aggressive osseous lesion. IMPRESSION: 1. Multiple loops of proximal small bowel with marked wall thickening consistent with nonspecific infectious/inflammatory enteritis. 2. Distended loops of small bowel with fecalization suggests ileus or partial obstruction. No distinct transition point is identified, and gas and stool is  present to the rectum. 3. Bilateral nonobstructing renal stones as above. Aortic Atherosclerosis (ICD10-I70.0). Electronically Signed   By: PValetta MoleM.D.   On: 06/14/2021 14:17     Scheduled Meds:  amLODipine  5 mg Oral Daily   aspirin EC  81 mg Oral Daily   enoxaparin (LOVENOX) injection  40 mg Subcutaneous Q24H   influenza vac split quadrivalent PF  0.5 mL Intramuscular Tomorrow-1000   multivitamin with minerals  1 tablet Oral Daily   polyethylene glycol  17 g Oral Daily   rosuvastatin  40 mg Oral Daily   Continuous Infusions:  lactated ringers 100 mL/hr at 06/16/21 0314     LOS: 2 days    Time spent: 25 mins    Taralynn Quiett, MD [facility]   If 7PM-7AM, please contact night-coverage

## 2021-06-16 NOTE — Progress Notes (Signed)
[facility] SURGICAL PROGRESS NOTE (cpt [phone])  Hospital Day(s): 2.   Post op day(s):  Marland Kitchen   Interval History: Patient seen and examined, no acute events or new complaints overnight. Patient reports tolerating clear liquids without issue, reports flatus, denies bowel movement. Denies pain or abdominal distention.  Review of Systems:  Constitutional: denies fever, chills  HEENT: denies cough or congestion  Respiratory: denies any shortness of breath  Cardiovascular: denies chest pain or palpitations  Gastrointestinal: denies abdominal pain, N/V, or diarrhea/and bowel function as per interval history Genitourinary: denies burning with urination or urinary frequency Musculoskeletal: denies pain, decreased motor or sensation Integumentary: denies any other rashes or skin discolorations Neurological: denies HA or vision/hearing changes   Vital signs in last 24 hours: [min-max] current  Temp:  [97.5 F (36.4 C)-98 F (36.7 C)] 98 F (36.7 C) (09/18 0525) Pulse Rate:  [68-75] 72 (09/18 0525) Resp:  [16-18] 16 (09/18 0525) BP: (105-112)/(56-68) 111/61 (09/18 0525) SpO2:  [93 %-99 %] 97 % (09/18 0525)     Height: '5\' 11"'$  (180.3 cm) Weight: 64 kg BMI (Calculated): 19.67   Intake/Output last 2 shifts:  09/17 0701 - 09/18 0700 In: 1027 [I.V.:1027] Out: 800 [Urine:800]   Physical Exam:  Constitutional: alert, cooperative and no distress  HENT: normocephalic without obvious abnormality  Respiratory: breathing non-labored at rest  Cardiovascular: regular rate and sinus rhythm  Gastrointestinal: soft, non-tender, and non-distended Musculoskeletal: UE and LE FROM, no edema or wounds, motor and sensation grossly intact, NT    Labs:  CBC Latest Ref Rng & Units 06/15/2021 06/14/2021 04/18/2020  WBC 4.0 - 10.5 K/uL 5.1 5.3 6.4  Hemoglobin 13.0 - 17.0 g/dL 12.0(L) 15.6 14.4  Hematocrit 39.0 - 52.0 % 34.9(L) 42.8 42.0  Platelets 150 - 400 K/uL 145(L) 200 173   CMP Latest Ref  Rng & Units 06/15/2021 06/14/2021 04/18/2020  Glucose 70 - 99 mg/dL 93 155(H) 127(H)  BUN 8 - 23 mg/dL 21 25(H) 13  Creatinine 0.61 - 1.24 mg/dL 1.20 1.11 0.99  Sodium 135 - 145 mmol/L 134(L) 134(L) 139  Potassium 3.5 - 5.1 mmol/L 3.6 2.8(L) 5.0  Chloride 98 - 111 mmol/L 94(L) 94(L) 100  CO2 22 - 32 mmol/L 33(H) 29 24  Calcium 8.9 - 10.3 mg/dL 8.1(L) 7.7(L) 9.6  Total Protein 6.5 - 8.1 g/dL - 6.4(L) 6.9  Total Bilirubin 0.3 - 1.2 mg/dL - 1.1 0.2  Alkaline Phos 38 - 126 U/L - 42 77  AST 15 - 41 U/L - 18 23  ALT 0 - 44 U/L - 19 16     Imaging studies: No new pertinent imaging studies   Assessment/Plan:  60 y.o. male with CT findings suspicious for partial small bowel obstruction, clinically not present.    Patient Active Problem List   Diagnosis Date Noted   SBO (small bowel obstruction) ([facility]) 06/14/2021   Special screening for malignant neoplasms, colon    Benign neoplasm of descending colon    Diverticulosis of large intestine without diverticulitis    Anal fissure s/p partial internal anal sphincterotomy 11/11/2018    Melanosis of colon    Internal hemorrhoids    Lumbar degenerative disc disease 04/06/2018   Piriformis muscle pain 04/06/2018   Chronic pain syndrome 10/22/2017   Chronic midline low back pain without sciatica 10/22/2017   Lumbar facet arthropathy 10/22/2017   Hyperglycemia 05/30/2015   Abnormal blood sugar 05/10/2015   Arteriosclerosis of coronary artery 05/10/2015   ED (erectile dysfunction) of organic origin  05/10/2015   Acid reflux 05/10/2015   HLD (hyperlipidemia) 05/10/2015   BP (high blood pressure) 05/10/2015   Lumbar radiculopathy 05/10/2015   Avitaminosis D 05/10/2015   Benign essential HTN 05/07/2015     -Advance diet as tolerated.  -I anticipate the patient may be ready for discharge after tolerating dietary advancement.  -We will sign off, feel free to call should he not progress as anticipated.    All of the above findings and  recommendations were discussed with the patient, and all of patient's were answered to his expressed satisfaction.   -- [name] M.D., [facility] 06/16/2021 7:23 AM

## 2021-06-17 DIAGNOSIS — K56609 Unspecified intestinal obstruction, unspecified as to partial versus complete obstruction: Secondary | ICD-10-CM | POA: Diagnosis not present

## 2021-06-17 LAB — URINALYSIS, COMPLETE (UACMP) WITH MICROSCOPIC
Bacteria, UA: NONE SEEN
Bilirubin Urine: NEGATIVE
Glucose, UA: NEGATIVE mg/dL
Hgb urine dipstick: NEGATIVE
Ketones, ur: NEGATIVE mg/dL
Leukocytes,Ua: NEGATIVE
Nitrite: NEGATIVE
Protein, ur: NEGATIVE mg/dL
Specific Gravity, Urine: 1.015 (ref 1.005–1.030)
Squamous Epithelial / HPF: NONE SEEN (ref 0–5)
pH: 7 (ref 5.0–8.0)

## 2021-06-17 LAB — GLUCOSE, CAPILLARY: Glucose-Capillary: 133 mg/dL — ABNORMAL HIGH (ref 70–99)

## 2021-06-17 NOTE — Discharge Instructions (Signed)
Advised to follow-up with primary care physician in 1 week. Advised to advance diet as tolerated.  SBO has resolved.

## 2021-06-17 NOTE — Discharge Summary (Signed)
Physician Discharge Summary  Shawn Crawford IRC:[id number] DOB: 1961/03/27 DOA: 06/14/2021  PCP: Jerrol Banana., MD  Admit date: 06/14/2021  Discharge date: 06/17/2021  Admitted From: Home.  Disposition: Home.  Recommendations for Outpatient Follow-up:  Follow up with PCP in 1-2 weeks. Please obtain BMP/CBC in one week. Advised to advance diet as tolerated.  SBO has resolved.  Home Health: None Equipment/Devices: None  Discharge Condition: Stable CODE STATUS:Full code Diet recommendation: Heart Healthy   Brief Summary / Hospital Course: This 60 years old male with PMH significant for hypertension, hyperlipidemia, coronary artery disease s/p CABG, history of appendicectomy, chronic pain on buprenorphine  presented in the ED with complaints of abdominal pain associated with nausea, vomiting and diarrhea.  Patient reported he had low-grade fever followed by nonbloody nonbilious vomiting and later he developed diarrhea and abdominal pain.  He denies any recent travel, sick contacts. He is fully vaccinated for COVID.  In the ED CT of abdomen and pelvis showed findings consistent with gastroenteritis with possible ileus versus partial small bowel obstruction.  Patient was admitted for small bowel obstruction / ileus, Kept NPO, continued on IV fluids.  Lipase normal. General surgery consulted.  Advised supportive care.  Patient has significant improvement in abdominal pain,  nausea and vomiting has resolved.  Patient was able to pass flatus and had a bowel movement.  Resumed on clear liquid diet,  advanced to full liquid to soft diet.  Patient tolerated well,  small bowel obstruction resolved.  Patient is being discharged home.   He was managed for below problems.   Discharge Diagnoses:  Active Problems:   SBO (small bowel obstruction) (HCC)  Abdominal pain, nausea, vomiting, diarrhea likely secondary to viral gastroenteritis: Patient presented with mild abdominal pain,  nausea  ,vomiting and diarrhea. CT abdomen showed findings consistent with viral gastroenteritis with possible ileus versus partial small bowel obstruction. Lipase normal, LFTs normal. C. difficile and GI panel negative. Continued on IV hydration,  NPO,  IV fluids. Repeat x-ray abdomen shows improving small bowel obstruction. Patient reports he has not had a bowel movement yet,  reported nausea and vomiting has improved. Low threshold for NG tube since abdominal pain is improving and sluggish bowel sounds. General surgery consulted,  started on clear liquid diet.  He tolerated well. Abdominal pain has improved,  he is able to pass flatus.  Advance diet to full liquids to soft. Patient is being discharged home after he tolerated regular diet.   Severe hypokalemia: Replaced and resolved.   Hypocalcemia : Patient was given calcium gluconate X 1.   Replaced and resolved.   Dyslipidemia: Continue Crestor   Hypertension: Continue amlodipine   Chronic pain syndrome: Continue buprenorphine and tramadol   CAD: Continue aspirin and Crestor. Patient denies any chest pain.    Discharge Instructions  Discharge Instructions     Call MD for:  difficulty breathing, headache or visual disturbances   Complete by: As directed    Call MD for:  persistant dizziness or light-headedness   Complete by: As directed    Call MD for:  persistant nausea and vomiting   Complete by: As directed    Diet - low sodium heart healthy   Complete by: As directed    Diet - low sodium heart healthy   Complete by: As directed    Diet Carb Modified   Complete by: As directed    Discharge instructions   Complete by: As directed    Advised to follow-up with  primary care physician in 1 week. Advised to advance diet as tolerated.  SBO has resolved.   Increase activity slowly   Complete by: As directed    Increase activity slowly   Complete by: As directed       Allergies as of 06/17/2021       Reactions    Penicillins Itching, Other (See Comments), Rash   Did it involve swelling of the face/tongue/throat, SOB, or low BP? No Did it involve sudden or severe rash/hives, skin peeling, or any reaction on the inside of your mouth or nose? No Did you need to seek medical attention at a hospital or doctor's office? Yes When did it last happen? Childhood allergy If all above answers are "NO", may proceed with cephalosporin use. Can take keflex with no problems        Medication List     TAKE these medications    amLODipine 5 MG tablet Commonly known as: NORVASC TAKE 1 TABLET BY MOUTH  DAILY   aspirin EC 81 MG tablet Take 81 mg by mouth daily.   Belbuca 600 MCG Film Generic drug: Buprenorphine HCl Place 1 Film inside cheek every 12 (twelve) hours as needed. For chronic pain syndrome   desonide 0.05 % ointment Commonly known as: DESOWEN Apply topically 3 (three) times a week. Apply topically on Mondays, Wednesdays, and Fridays.   ketoconazole 2 % cream Commonly known as: NIZORAL Apply topically on Tuesdays, Thursdays, and Saturdays.   MULTIPLE VITAMINS PO Take 1 tablet by mouth daily.   ondansetron 4 MG disintegrating tablet Commonly known as: Zofran ODT Take 1 tablet (4 mg total) by mouth every 8 (eight) hours as needed for nausea or vomiting.   rosuvastatin 40 MG tablet Commonly known as: CRESTOR TAKE 1 TABLET BY MOUTH  DAILY   tadalafil 5 MG tablet Commonly known as: Cialis Take 1 tablet (5 mg total) by mouth daily.   testosterone cypionate 200 MG/ML injection Commonly known as: DEPOTESTOSTERONE CYPIONATE Inject 0.5 mLs (100 mg total) into the muscle every 14 (fourteen) days. Inject 5 cc IM every 2 weeks   traMADol 50 MG tablet Commonly known as: ULTRAM Take 1 tablet (50 mg total) by mouth every 8 (eight) hours as needed for severe pain. Month last 30 days.   zolpidem 12.5 MG CR tablet Commonly known as: AMBIEN CR TAKE ONE TABLET BY MOUTH AT BEDTIME AS NEEDED FOR  SLEEP. (STOP ZOLPIDEM 10 MG)        Follow-up Information     Jerrol Banana., MD Follow up in 1 week(s).   Specialty: Family Medicine Contact information: [address] [place] [address] [phone]                Allergies  Allergen Reactions   Penicillins Itching, Other (See Comments) and Rash    Did it involve swelling of the face/tongue/throat, SOB, or low BP? No Did it involve sudden or severe rash/hives, skin peeling, or any reaction on the inside of your mouth or nose? No Did you need to seek medical attention at a hospital or doctor's office? Yes When did it last happen? Childhood allergy If all above answers are "NO", may proceed with cephalosporin use.  Can take keflex with no problems     Consultations: General surgery   Procedures/Studies: DG Abd 1 View  Result Date: 06/15/2021 CLINICAL DATA:  Follow up small bowel obstruction. EXAM: ABDOMEN - 1 VIEW COMPARISON:  06/14/2021 CT FINDINGS: Distended small bowel  loops have decreased in caliber from 06/14/2021 CT. UPPER limits of normal small bowel caliber loops are noted. Gas in the colon and rectum is present. No suspicious calcifications are identified. No acute bony abnormalities are noted. IMPRESSION: Improving small bowel dilatation since 06/14/2021 CT. Electronically Signed   By: Margarette Canada M.D.   On: 06/15/2021 10:47   CT ABDOMEN PELVIS W CONTRAST  Result Date: 06/14/2021 CLINICAL DATA:  Anterior low abdominal pain. EXAM: CT ABDOMEN AND PELVIS WITH CONTRAST TECHNIQUE: Multidetector CT imaging of the abdomen and pelvis was performed using the standard protocol following bolus administration of intravenous contrast. CONTRAST:  172mL OMNIPAQUE IOHEXOL 350 MG/ML SOLN COMPARISON:  CT abdomen/pelvis 05/05/2018 FINDINGS: Lower chest: There is a calcified granuloma in the left lung base, unchanged. The lung bases are otherwise clear. Coronary artery calcifications are noted. The imaged  heart is otherwise unremarkable. Hepatobiliary: The liver and gallbladder are unremarkable. There is no biliary ductal dilatation. Pancreas: Unremarkable. Spleen: There are numerous calcified granulomas throughout the spleen, unchanged. Adrenals/Urinary Tract: The adrenals are unremarkable. There is a 3-4 mm left upper pole renal stone and a punctate right upper pole renal stone, new since 2019. There is a small cyst in the left interpolar region. There are no suspicious lesions. There is no hydronephrosis or hydroureter. The bladder is unremarkable. Stomach/Bowel: The stomach is decompressed, grossly unremarkable. There is wall thickening involving the proximal bowel most notably in the fourth portion of the duodenum. There is moderate dilation of multiple loops of jejunum with the diameter measuring up to 4.5 cm. There is fecalization of material within multiple loops of small bowel. There is clumped decompressed small bowel in the pelvis, though the terminal ileum is not completely decompressed. Gas and stool is present to the rectum. There is no pneumatosis intestinalis. There is moderate stool throughout the colon. Vascular/Lymphatic: There is scattered calcified atherosclerotic plaque throughout the abdominal aorta. There is multifocal bulging without evidence of frank aneurysmal dilation. The major branch vessels are patent. The main portal and splenic veins are patent. There is no portal venous gas. There is no abdominal or pelvic lymphadenopathy. Reproductive: The prostate seminal vesicles are unremarkable. Other: There is no ascites or free air. Musculoskeletal: There is no acute osseous abnormality or aggressive osseous lesion. IMPRESSION: 1. Multiple loops of proximal small bowel with marked wall thickening consistent with nonspecific infectious/inflammatory enteritis. 2. Distended loops of small bowel with fecalization suggests ileus or partial obstruction. No distinct transition point is identified,  and gas and stool is present to the rectum. 3. Bilateral nonobstructing renal stones as above. Aortic Atherosclerosis (ICD10-I70.0). Electronically Signed   By: Valetta Mole M.D.   On: 06/14/2021 14:17     Subjective: Patient was seen and examined at bedside.  Overnight events noted.  Patient reports feeling much improved.  Able to pass flatus and had a bowel movement.  Patient also tolerated soft diet.  He wants to be discharged.  Discharge Exam: Vitals:   06/17/21 0501 06/17/21 0734  BP: 101/61 109/74  Pulse: 62 76  Resp: 17 18  Temp: 98.5 F (36.9 C) 97.7 F (36.5 C)  SpO2: 98% 99%   Vitals:   06/16/21 2034 06/16/21 2118 06/17/21 0501 06/17/21 0734  BP: 110/62 116/66 101/61 109/74  Pulse: 70 67 62 76  Resp: 16 18 17 18   Temp: 98.3 F (36.8 C) 98 F (36.7 C) 98.5 F (36.9 C) 97.7 F (36.5 C)  TempSrc: Oral Oral Oral Oral  SpO2: 97% 98% 98%  99%  Weight:      Height:        General: Pt is alert, awake, not in acute distress Cardiovascular: RRR, S1/S2 +, no rubs, no gallops Respiratory: CTA bilaterally, no wheezing, no rhonchi Abdominal: Soft, NT, ND, bowel sounds + Extremities: no edema, no cyanosis    The results of significant diagnostics from this hospitalization (including imaging, microbiology, ancillary and laboratory) are listed below for reference.     Microbiology: Recent Results (from the past 240 hour(s))  Gastrointestinal Panel by PCR , Stool     Status: None   Collection Time: 06/14/21 12:45 PM   Specimen: STOOL  Result Value Ref Range Status   Campylobacter species NOT DETECTED NOT DETECTED Final   Plesimonas shigelloides NOT DETECTED NOT DETECTED Final   Salmonella species NOT DETECTED NOT DETECTED Final   Yersinia enterocolitica NOT DETECTED NOT DETECTED Final   Vibrio species NOT DETECTED NOT DETECTED Final   Vibrio cholerae NOT DETECTED NOT DETECTED Final   Enteroaggregative E coli (EAEC) NOT DETECTED NOT DETECTED Final   Enteropathogenic E  coli (EPEC) NOT DETECTED NOT DETECTED Final   Enterotoxigenic E coli (ETEC) NOT DETECTED NOT DETECTED Final   Shiga like toxin producing E coli (STEC) NOT DETECTED NOT DETECTED Final   Shigella/Enteroinvasive E coli (EIEC) NOT DETECTED NOT DETECTED Final   Cryptosporidium NOT DETECTED NOT DETECTED Final   Cyclospora cayetanensis NOT DETECTED NOT DETECTED Final   Entamoeba histolytica NOT DETECTED NOT DETECTED Final   Giardia lamblia NOT DETECTED NOT DETECTED Final   Adenovirus F40/41 NOT DETECTED NOT DETECTED Final   Astrovirus NOT DETECTED NOT DETECTED Final   Norovirus GI/GII NOT DETECTED NOT DETECTED Final   Rotavirus A NOT DETECTED NOT DETECTED Final   Sapovirus (I, II, IV, and V) NOT DETECTED NOT DETECTED Final    Comment: Performed at [facility], [address] [place], [place] [address]  C Difficile Quick Screen w PCR reflex     Status: None   Collection Time: 06/14/21 12:45 PM   Specimen: STOOL  Result Value Ref Range Status   C Diff antigen NEGATIVE NEGATIVE Final   C Diff toxin NEGATIVE NEGATIVE Final   C Diff interpretation No C. difficile detected.  Final    Comment: Performed at [facility], [address] [place], [place] [address]  Resp Panel by RT-PCR (Flu A&B, Covid) Nasopharyngeal Swab     Status: None   Collection Time: 06/14/21  6:09 PM   Specimen: Nasopharyngeal Swab; Nasopharyngeal(NP) swabs in vial transport medium  Result Value Ref Range Status   SARS Coronavirus 2 by RT PCR NEGATIVE NEGATIVE Final    Comment: (NOTE) SARS-CoV-2 target nucleic acids are NOT DETECTED.  The SARS-CoV-2 RNA is generally detectable in upper respiratory specimens during the acute phase of infection. The lowest concentration of SARS-CoV-2 viral copies this assay can detect is 138 copies/mL. A negative result does not preclude SARS-Cov-2 infection and should not be used as the sole basis for treatment or other patient management decisions. A negative result  may occur with  improper specimen collection/handling, submission of specimen other than nasopharyngeal swab, presence of viral mutation(s) within the areas targeted by this assay, and inadequate number of viral copies(<138 copies/mL). A negative result must be combined with clinical observations, patient history, and epidemiological information. The expected result is Negative.  Fact Sheet for Patients:  EntrepreneurPulse.com.au  Fact Sheet for Healthcare Providers:  IncredibleEmployment.be  This test is no t yet approved or  cleared by the [place] and  has been authorized for detection and/or diagnosis of SARS-CoV-2 by FDA under an Emergency Use Authorization (EUA). This EUA will remain  in effect (meaning this test can be used) for the duration of the COVID-19 declaration under Section 564(b)(1) of the Act, 21 U.S.C.section 360bbb-3(b)(1), unless the authorization is terminated  or revoked sooner.       Influenza A by PCR NEGATIVE NEGATIVE Final   Influenza B by PCR NEGATIVE NEGATIVE Final    Comment: (NOTE) The Xpert Xpress SARS-CoV-2/FLU/RSV plus assay is intended as an aid in the diagnosis of influenza from Nasopharyngeal swab specimens and should not be used as a sole basis for treatment. Nasal washings and aspirates are unacceptable for Xpert Xpress SARS-CoV-2/FLU/RSV testing.  Fact Sheet for Patients: EntrepreneurPulse.com.au  Fact Sheet for Healthcare Providers: IncredibleEmployment.be  This test is not yet approved or cleared by the [place] FDA and has been authorized for detection and/or diagnosis of SARS-CoV-2 by FDA under an Emergency Use Authorization (EUA). This EUA will remain in effect (meaning this test can be used) for the duration of the COVID-19 declaration under Section 564(b)(1) of the Act, 21 U.S.C. section 360bbb-3(b)(1), unless the authorization is terminated  or revoked.  Performed at [facility], [address] [place], [place] [address]      Labs: BNP (last 3 results) No results for input(s): BNP in the last 8760 hours. Basic Metabolic Panel: Recent Labs  Lab 06/14/21 1218 06/14/21 1525 06/15/21 0500  NA 134*  --  134*  K 2.8*  --  3.6  CL 94*  --  94*  CO2 29  --  33*  GLUCOSE 155*  --  93  BUN 25*  --  21  CREATININE 1.11  --  1.20  CALCIUM 7.7*  --  8.1*  MG  --  2.3 2.0  PHOS  --  3.8 2.5   Liver Function Tests: Recent Labs  Lab 06/14/21 1218  AST 18  ALT 19  ALKPHOS 42  BILITOT 1.1  PROT 6.4*  ALBUMIN 3.6   Recent Labs  Lab 06/14/21 1218  LIPASE 23   No results for input(s): AMMONIA in the last 168 hours. CBC: Recent Labs  Lab 06/14/21 1218 06/15/21 0500  WBC 5.3 5.1  HGB 15.6 12.0*  HCT 42.8 34.9*  MCV 86.1 87.5  PLT 200 145*   Cardiac Enzymes: No results for input(s): CKTOTAL, CKMB, CKMBINDEX, TROPONINI in the last 168 hours. BNP: Invalid input(s): POCBNP CBG: Recent Labs  Lab 06/15/21 0840 06/16/21 0802 06/17/21 0754  GLUCAP 83 103* 133*   D-Dimer No results for input(s): DDIMER in the last 72 hours. Hgb A1c No results for input(s): HGBA1C in the last 72 hours. Lipid Profile No results for input(s): CHOL, HDL, LDLCALC, TRIG, CHOLHDL, LDLDIRECT in the last 72 hours. Thyroid function studies No results for input(s): TSH, T4TOTAL, T3FREE, THYROIDAB in the last 72 hours.  Invalid input(s): FREET3 Anemia work up No results for input(s): VITAMINB12, FOLATE, FERRITIN, TIBC, IRON, RETICCTPCT in the last 72 hours. Urinalysis    Component Value Date/Time   COLORURINE YELLOW 06/17/2021 0335   APPEARANCEUR CLEAR 06/17/2021 0335   LABSPEC 1.015 06/17/2021 0335   PHURINE 7.0 06/17/2021 0335   GLUCOSEU NEGATIVE 06/17/2021 0335   HGBUR NEGATIVE 06/17/2021 0335   BILIRUBINUR NEGATIVE 06/17/2021 0335   BILIRUBINUR negative 04/17/2020 1043   KETONESUR NEGATIVE 06/17/2021 0335    PROTEINUR NEGATIVE 06/17/2021 0335   UROBILINOGEN 0.2  04/17/2020 1043   NITRITE NEGATIVE 06/17/2021 0335   LEUKOCYTESUR NEGATIVE 06/17/2021 0335   Sepsis Labs Invalid input(s): PROCALCITONIN,  WBC,  LACTICIDVEN Microbiology Recent Results (from the past 240 hour(s))  Gastrointestinal Panel by PCR , Stool     Status: None   Collection Time: 06/14/21 12:45 PM   Specimen: STOOL  Result Value Ref Range Status   Campylobacter species NOT DETECTED NOT DETECTED Final   Plesimonas shigelloides NOT DETECTED NOT DETECTED Final   Salmonella species NOT DETECTED NOT DETECTED Final   Yersinia enterocolitica NOT DETECTED NOT DETECTED Final   Vibrio species NOT DETECTED NOT DETECTED Final   Vibrio cholerae NOT DETECTED NOT DETECTED Final   Enteroaggregative E coli (EAEC) NOT DETECTED NOT DETECTED Final   Enteropathogenic E coli (EPEC) NOT DETECTED NOT DETECTED Final   Enterotoxigenic E coli (ETEC) NOT DETECTED NOT DETECTED Final   Shiga like toxin producing E coli (STEC) NOT DETECTED NOT DETECTED Final   Shigella/Enteroinvasive E coli (EIEC) NOT DETECTED NOT DETECTED Final   Cryptosporidium NOT DETECTED NOT DETECTED Final   Cyclospora cayetanensis NOT DETECTED NOT DETECTED Final   Entamoeba histolytica NOT DETECTED NOT DETECTED Final   Giardia lamblia NOT DETECTED NOT DETECTED Final   Adenovirus F40/41 NOT DETECTED NOT DETECTED Final   Astrovirus NOT DETECTED NOT DETECTED Final   Norovirus GI/GII NOT DETECTED NOT DETECTED Final   Rotavirus A NOT DETECTED NOT DETECTED Final   Sapovirus (I, II, IV, and V) NOT DETECTED NOT DETECTED Final    Comment: Performed at [facility], [address] [place], [place] [address]  C Difficile Quick Screen w PCR reflex     Status: None   Collection Time: 06/14/21 12:45 PM   Specimen: STOOL  Result Value Ref Range Status   C Diff antigen NEGATIVE NEGATIVE Final   C Diff toxin NEGATIVE NEGATIVE Final   C Diff interpretation No C. difficile detected.   Final    Comment: Performed at [facility], [address] [place], [place] [address]  Resp Panel by RT-PCR (Flu A&B, Covid) Nasopharyngeal Swab     Status: None   Collection Time: 06/14/21  6:09 PM   Specimen: Nasopharyngeal Swab; Nasopharyngeal(NP) swabs in vial transport medium  Result Value Ref Range Status   SARS Coronavirus 2 by RT PCR NEGATIVE NEGATIVE Final    Comment: (NOTE) SARS-CoV-2 target nucleic acids are NOT DETECTED.  The SARS-CoV-2 RNA is generally detectable in upper respiratory specimens during the acute phase of infection. The lowest concentration of SARS-CoV-2 viral copies this assay can detect is 138 copies/mL. A negative result does not preclude SARS-Cov-2 infection and should not be used as the sole basis for treatment or other patient management decisions. A negative result may occur with  improper specimen collection/handling, submission of specimen other than nasopharyngeal swab, presence of viral mutation(s) within the areas targeted by this assay, and inadequate number of viral copies(<138 copies/mL). A negative result must be combined with clinical observations, patient history, and epidemiological information. The expected result is Negative.  Fact Sheet for Patients:  EntrepreneurPulse.com.au  Fact Sheet for Healthcare Providers:  IncredibleEmployment.be  This test is no t yet approved or cleared by the [place] FDA and  has been authorized for detection and/or diagnosis of SARS-CoV-2 by FDA under an Emergency Use Authorization (EUA). This EUA will remain  in effect (meaning this test can be used) for the duration of the COVID-19 declaration under Section 564(b)(1) of the Act, 21 U.S.C.section 360bbb-3(b)(1),  unless the authorization is terminated  or revoked sooner.       Influenza A by PCR NEGATIVE NEGATIVE Final   Influenza B by PCR NEGATIVE NEGATIVE Final    Comment: (NOTE) The Xpert  Xpress SARS-CoV-2/FLU/RSV plus assay is intended as an aid in the diagnosis of influenza from Nasopharyngeal swab specimens and should not be used as a sole basis for treatment. Nasal washings and aspirates are unacceptable for Xpert Xpress SARS-CoV-2/FLU/RSV testing.  Fact Sheet for Patients: EntrepreneurPulse.com.au  Fact Sheet for Healthcare Providers: IncredibleEmployment.be  This test is not yet approved or cleared by the [place] FDA and has been authorized for detection and/or diagnosis of SARS-CoV-2 by FDA under an Emergency Use Authorization (EUA). This EUA will remain in effect (meaning this test can be used) for the duration of the COVID-19 declaration under Section 564(b)(1) of the Act, 21 U.S.C. section 360bbb-3(b)(1), unless the authorization is terminated or revoked.  Performed at [facility], [address] [place], [place] [address]      Time coordinating discharge: Over 30 minutes  SIGNED:   Shawna Clamp, MD  [facility] 06/17/2021, 2:08 PM Pager   If 7PM-7AM, please contact night-coverage

## 2021-06-18 LAB — GIARDIA/CRYPTOSPORIDIUM EIA
Cryptosporidium EIA: NEGATIVE
Giardia Ag, Stl: NEGATIVE

## 2021-06-21 ENCOUNTER — Telehealth: Payer: Self-pay

## 2021-06-21 NOTE — Telephone Encounter (Signed)
Transition Care Management Unsuccessful Follow-up Telephone Call  Date of discharge and from where:  [facility] on 06/17/2021  Attempts:  1st Attempt  Reason for unsuccessful TCM follow-up call:  Left voice message

## 2021-07-05 ENCOUNTER — Other Ambulatory Visit: Payer: Self-pay | Admitting: Family Medicine

## 2021-07-06 NOTE — Telephone Encounter (Signed)
Requested medications are due for refill today yes  Requested medications are on the active medication list yes  Last refill 06/06/21  Last visit 01/21/21  Future visit scheduled 07/29/21  Notes to clinic Ambien is not delegated, Testosterone does not have a protocol to review, please assess.

## 2021-07-10 ENCOUNTER — Other Ambulatory Visit: Payer: Self-pay | Admitting: Family Medicine

## 2021-07-10 DIAGNOSIS — E785 Hyperlipidemia, unspecified: Secondary | ICD-10-CM

## 2021-07-11 NOTE — Telephone Encounter (Signed)
Requested Prescriptions  Pending Prescriptions Disp Refills  . rosuvastatin (CRESTOR) 40 MG tablet [Pharmacy Med Name: Rosuvastatin Calcium 40 MG Oral Tablet] 90 tablet 0    Sig: TAKE 1 TABLET BY MOUTH  DAILY     Cardiovascular:  Antilipid - Statins Failed - 07/10/2021  4:00 PM      Failed - Total Cholesterol in normal range and within 360 days    Cholesterol, Total  Date Value Ref Range Status  04/18/2020 139 100 - 199 mg/dL Final         Failed - LDL in normal range and within 360 days    LDL Chol Calc (NIH)  Date Value Ref Range Status  04/18/2020 83 0 - 99 mg/dL Final         Failed - HDL in normal range and within 360 days    HDL  Date Value Ref Range Status  04/18/2020 45 >39 mg/dL Final         Failed - Triglycerides in normal range and within 360 days    Triglycerides  Date Value Ref Range Status  04/18/2020 49 0 - 149 mg/dL Final         Passed - Patient is not pregnant      Passed - Valid encounter within last 12 months    Recent Outpatient Visits          5 months ago [place] Jerrol Banana., MD   7 months ago ED (erectile dysfunction) of organic origin   [facility] Rosanna Randy, Retia Passe., MD   10 months ago [place] Jerrol Banana., MD   1 year ago Annual physical exam   [facility] Jerrol Banana., MD   1 year ago Hypogonadism in male   [facility] Jerrol Banana., MD      Future Appointments            In 2 weeks Jerrol Banana., MD [facility] [place]   In 1 month Ralene Bathe, MD [place]

## 2021-07-29 ENCOUNTER — Ambulatory Visit (INDEPENDENT_AMBULATORY_CARE_PROVIDER_SITE_OTHER): Payer: 59 | Admitting: Family Medicine

## 2021-07-29 ENCOUNTER — Other Ambulatory Visit: Payer: Self-pay

## 2021-07-29 VITALS — BP 120/77 | HR 96 | Temp 98.5°F | Ht 71.0 in | Wt 154.0 lb

## 2021-07-29 DIAGNOSIS — M5416 Radiculopathy, lumbar region: Secondary | ICD-10-CM | POA: Diagnosis not present

## 2021-07-29 DIAGNOSIS — R7989 Other specified abnormal findings of blood chemistry: Secondary | ICD-10-CM | POA: Diagnosis not present

## 2021-07-29 DIAGNOSIS — I2510 Atherosclerotic heart disease of native coronary artery without angina pectoris: Secondary | ICD-10-CM

## 2021-07-29 DIAGNOSIS — Z0000 Encounter for general adult medical examination without abnormal findings: Secondary | ICD-10-CM | POA: Diagnosis not present

## 2021-07-29 DIAGNOSIS — K5730 Diverticulosis of large intestine without perforation or abscess without bleeding: Secondary | ICD-10-CM

## 2021-07-29 NOTE — Progress Notes (Signed)
Complete physical exam   Patient: Shawn Crawford   DOB: 09/23/1961   60 y.o. Male  MRN: [id number] Visit Date: 07/29/2021  Today's healthcare provider: [name], MD   No chief complaint on file.  Subjective    Shawn Crawford is a 60 y.o. male who presents today for a complete physical exam.  He reports consuming a general diet.  He generally feels well. He reports sleeping well. He does not have additional problems to discuss today.  Patient is retired and has 3 sons.  Has a fianc who is a retired Chief Executive Officer.  He is doing well. HPI    Past Medical History:  Diagnosis Date   Actinic keratosis    Dysplastic nevus 09/06/2014   Epigastric at mid line. Moderate to severe atypia, lateral margin involved. Excised 11/07/2014, margins free.   Dysplastic nevus 07/09/2015   Left lat. epigastric/costal margin. Moderate aypia, limited margins free.    Hx of cardiac catheterization    Hyperlipidemia    Hypertension    Past Surgical History:  Procedure Laterality Date   APPENDECTOMY     CARDIAC CATHETERIZATION     COLONOSCOPY WITH PROPOFOL N/A 06/24/2018   Procedure: COLONOSCOPY WITH PROPOFOL;  Surgeon: Virgel Manifold, MD;  Location: [facility] ENDOSCOPY;  Service: Endoscopy;  Laterality: N/A;   CORONARY ARTERY BYPASS GRAFT     x 3   ESOPHAGOGASTRODUODENOSCOPY (EGD) WITH PROPOFOL N/A 06/24/2018   Procedure: ESOPHAGOGASTRODUODENOSCOPY (EGD) WITH PROPOFOL;  Surgeon: Virgel Manifold, MD;  Location: [facility] ENDOSCOPY;  Service: Endoscopy;  Laterality: N/A;   EVALUATION UNDER ANESTHESIA WITH HEMORRHOIDECTOMY N/A 11/11/2018   Procedure: HEMORRHOIDAL LIGATION/PEXY, PARTIAL LATERAL SPINCTEROTOMY;  Surgeon: Michael Boston, MD;  Location: [facility] ORS;  Service: General;  Laterality: N/A;   HERNIA REPAIR     Social History   Socioeconomic History   Marital status: Single    Spouse name: Not on file   Number of children: Not on file   Years of education: Not on file   Highest education  level: Not on file  Occupational History   Not on file  Tobacco Use   Smoking status: Never   Smokeless tobacco: Never  Vaping Use   Vaping Use: Never used  Substance and Sexual Activity   Alcohol use: Yes    Alcohol/week: 0.0 standard drinks    Comment: glass of wine occasionally   Drug use: No   Sexual activity: Not on file  Other Topics Concern   Not on file  Social History Narrative   Not on file   Social Determinants of Health   Financial Resource Strain: Not on file  Food Insecurity: Not on file  Transportation Needs: Not on file  Physical Activity: Not on file  Stress: Not on file  Social Connections: Not on file  Intimate Partner Violence: Not on file   Family Status  Relation Name Status   Mother  Alive   Father  Deceased   Sister  Alive   Son  Alive   Sister  Alive   Son  Alive   Family History  Problem Relation Age of Onset   Hyperlipidemia Mother    Diabetes Father    Hypertension Father    Thyroid cancer Sister    Colon cancer Sister    Allergies  Allergen Reactions   Penicillins Itching, Other (See Comments) and Rash    Did it involve swelling of the face/tongue/throat, SOB, or low BP? No Did it involve sudden or  severe rash/hives, skin peeling, or any reaction on the inside of your mouth or nose? No Did you need to seek medical attention at a hospital or doctor's office? Yes When did it last happen? Childhood allergy If all above answers are "NO", may proceed with cephalosporin use.  Can take keflex with no problems     Patient Care Team: Jerrol Banana., MD as PCP - General (Family Medicine) Michael Boston, MD as Consulting Physician (General Surgery) Corey Skains, MD as Consulting Physician (Cardiology) Virgel Manifold, MD as Consulting Physician (Gastroenterology)   Medications: Outpatient Medications Prior to Visit  Medication Sig   amLODipine (NORVASC) 5 MG tablet TAKE 1 TABLET BY MOUTH  DAILY   aspirin EC 81 MG  tablet Take 81 mg by mouth daily.   Buprenorphine HCl (BELBUCA) 600 MCG FILM Place 1 Film inside cheek every 12 (twelve) hours as needed. For chronic pain syndrome   desonide (DESOWEN) 0.05 % ointment Apply topically 3 (three) times a week. Apply topically on Mondays, Wednesdays, and Fridays.   ketoconazole (NIZORAL) 2 % cream Apply topically on Tuesdays, Thursdays, and Saturdays.   MULTIPLE VITAMINS PO Take 1 tablet by mouth daily.    ondansetron (ZOFRAN ODT) 4 MG disintegrating tablet Take 1 tablet (4 mg total) by mouth every 8 (eight) hours as needed for nausea or vomiting.   rosuvastatin (CRESTOR) 40 MG tablet TAKE 1 TABLET BY MOUTH  DAILY   tadalafil (CIALIS) 5 MG tablet Take 1 tablet (5 mg total) by mouth daily.   testosterone cypionate (DEPOTESTOSTERONE CYPIONATE) 200 MG/ML injection INJECT 0.5ML (100MG  TOTAL) INTO THE MUSCLE EVERY 14 DAYS   traMADol (ULTRAM) 50 MG tablet Take 1 tablet (50 mg total) by mouth every 8 (eight) hours as needed for severe pain. Month last 30 days.   zolpidem (AMBIEN CR) 12.5 MG CR tablet TAKE ONE TABLET BY MOUTH AT BEDTIME AS NEEDED FOR SLEEP   No facility-administered medications prior to visit.    Review of Systems  Constitutional: Negative.   HENT: Negative.    Eyes: Negative.   Respiratory: Negative.    Cardiovascular: Negative.   Gastrointestinal: Negative.   Endocrine: Negative.   Genitourinary: Negative.   Musculoskeletal: Negative.   Skin: Negative.   Allergic/Immunologic: Negative.   Neurological: Negative.   Hematological: Negative.   Psychiatric/Behavioral: Negative.        Objective    BP 120/77 (BP Location: Right Arm, Patient Position: Sitting, Cuff Size: Normal)   Pulse 96   Temp 98.5 F (36.9 C) (Oral)   Ht 5\' 11"  (1.803 m)   Wt 154 lb (69.9 kg)   SpO2 98%   BMI 21.48 kg/m  BP Readings from Last 3 Encounters:  07/29/21 120/77  06/17/21 109/74  05/28/21 110/74   Wt Readings from Last 3 Encounters:  07/29/21 154 lb  (69.9 kg)  06/14/21 141 lb (64 kg)  05/28/21 150 lb (68 kg)      Physical Exam Constitutional:      Appearance: Normal appearance. He is normal weight.  HENT:     Head: Normocephalic and atraumatic.     Right Ear: Tympanic membrane, ear canal and external ear normal.     Left Ear: Tympanic membrane, ear canal and external ear normal.     Nose: Nose normal.     Mouth/Throat:     Mouth: Mucous membranes are moist.     Pharynx: Oropharynx is clear.  Eyes:     Extraocular Movements: Extraocular movements intact.  Conjunctiva/sclera: Conjunctivae normal.     Pupils: Pupils are equal, round, and reactive to light.  Cardiovascular:     Rate and Rhythm: Normal rate and regular rhythm.     Pulses: Normal pulses.     Heart sounds: Normal heart sounds.  Pulmonary:     Effort: Pulmonary effort is normal.     Breath sounds: Normal breath sounds.  Abdominal:     General: Abdomen is flat. Bowel sounds are normal.     Palpations: Abdomen is soft.  Musculoskeletal:     Cervical back: Normal range of motion and neck supple.  Skin:    General: Skin is warm and dry.  Neurological:     General: No focal deficit present.     Mental Status: He is alert and oriented to person, place, and time. Mental status is at baseline.  Psychiatric:        Mood and Affect: Mood normal.        Behavior: Behavior normal.        Thought Content: Thought content normal.        Judgment: Judgment normal.      Last depression screening scores PHQ 2/9 Scores 07/29/2021 02/19/2021 07/24/2020  PHQ - 2 Score 0 0 0  PHQ- 9 Score 0 - -  Exception Documentation - - -   Last fall risk screening Fall Risk  07/29/2021  Falls in the past year? 0  Number falls in past yr: 0  Injury with Fall? 0  Risk for fall due to : -  Follow up -   Last Audit-C alcohol use screening Alcohol Use Disorder Test (AUDIT) 07/29/2021  1. How often do you have a drink containing alcohol? 1  2. How many drinks containing alcohol  do you have on a typical day when you are drinking? 0  3. How often do you have six or more drinks on one occasion? 0  AUDIT-C Score 1   A score of 3 or more in women, and 4 or more in men indicates increased risk for alcohol abuse, EXCEPT if all of the points are from question 1   No results found for any visits on 07/29/21.  Assessment & Plan    Routine Health Maintenance and Physical Exam  Exercise Activities and Dietary recommendations  Goals   None     Immunization History  Administered Date(s) Administered   Influenza,inj,Quad PF,6+ Mos 05/30/2015, 06/16/2016, 05/27/2017, 08/04/2018, 06/22/2019, 07/19/2020, 06/16/2021   Moderna Sars-Covid-2 Vaccination 08/13/2020   Pneumococcal Conjugate-13 08/21/2015   Td 04/13/2019   Tdap 12/04/2008    Health Maintenance  Topic Date Due   FOOT EXAM  Never done   OPHTHALMOLOGY EXAM  Never done   URINE MICROALBUMIN  Never done   Zoster Vaccines- Shingrix (1 of 2) Never done   Pneumococcal Vaccine 30-67 Years old (2 - PPSV23 if available, else PCV20) 08/20/2016   HEMOGLOBIN A1C  05/08/2020   COVID-19 Vaccine (2 - Moderna risk series) 09/10/2020   COLONOSCOPY (Pts 45-12yrs Insurance coverage will need to be confirmed)  06/25/2023   TETANUS/TDAP  04/12/2029   INFLUENZA VACCINE  Completed   Hepatitis C Screening  Completed   HIV Screening  Completed   HPV VACCINES  Aged Out    Discussed health benefits of physical activity, and encouraged him to engage in regular exercise appropriate for his age and condition.  1. Annual physical exam  - Lipid Panel With LDL/HDL Ratio - TSH  2. Low testosterone Feels better on  therapy - PSA - Testosterone  3. Arteriosclerosis of coronary artery That is post CABG at age 68.  All risk factors treated.  4. Lumbar radiculopathy Followed by pain clinic  5. Diverticulosis of large intestine without diverticulitis    No follow-ups on file.     I, [name], MD, have reviewed all  documentation for this visit. The documentation on 08/03/21 for the exam, diagnosis, procedures, and orders are all accurate and complete.    Romana Deaton Cranford Mon, MD  [facility] [phone] (phone) [phone] (fax)  [place]

## 2021-08-17 ENCOUNTER — Encounter: Payer: Self-pay | Admitting: Family Medicine

## 2021-08-17 LAB — LIPID PANEL WITH LDL/HDL RATIO
Cholesterol, Total: 154 mg/dL (ref 100–199)
HDL: 51 mg/dL (ref >39)
LDL Chol Calc (NIH): 80 mg/dL (ref 0–99)
LDL/HDL Ratio: 1.6 ratio (ref 0.0–3.6)
Triglycerides: 133 mg/dL (ref 0–149)
VLDL Cholesterol Cal: 23 mg/dL (ref 5–40)

## 2021-08-17 LAB — TESTOSTERONE: Testosterone: 263 ng/dL — ABNORMAL LOW (ref 264–916)

## 2021-08-17 LAB — TSH: TSH: 1.54 u[IU]/mL (ref 0.450–4.500)

## 2021-08-17 LAB — PSA: Prostate Specific Ag, Serum: 0.6 ng/mL (ref 0.0–4.0)

## 2021-08-26 ENCOUNTER — Other Ambulatory Visit: Payer: Self-pay | Admitting: Family Medicine

## 2021-08-26 MED ORDER — TESTOSTERONE CYPIONATE 200 MG/ML IM SOLN: 200.0000 mg | INTRAMUSCULAR | 5 refills | Status: DC

## 2021-08-27 ENCOUNTER — Other Ambulatory Visit: Payer: Self-pay

## 2021-08-27 ENCOUNTER — Encounter: Payer: Self-pay | Admitting: Student in an Organized Health Care Education/Training Program

## 2021-08-27 ENCOUNTER — Ambulatory Visit
Payer: 59 | Attending: Student in an Organized Health Care Education/Training Program | Admitting: Student in an Organized Health Care Education/Training Program

## 2021-08-27 VITALS — BP 139/84 | HR 87 | Temp 97.0°F | Resp 15 | Ht 71.0 in | Wt 152.0 lb

## 2021-08-27 DIAGNOSIS — M545 Low back pain, unspecified: Secondary | ICD-10-CM

## 2021-08-27 DIAGNOSIS — M5416 Radiculopathy, lumbar region: Secondary | ICD-10-CM | POA: Diagnosis present

## 2021-08-27 DIAGNOSIS — G8929 Other chronic pain: Secondary | ICD-10-CM | POA: Insufficient documentation

## 2021-08-27 DIAGNOSIS — M7918 Myalgia, other site: Secondary | ICD-10-CM | POA: Insufficient documentation

## 2021-08-27 DIAGNOSIS — G894 Chronic pain syndrome: Secondary | ICD-10-CM | POA: Diagnosis present

## 2021-08-27 DIAGNOSIS — M47816 Spondylosis without myelopathy or radiculopathy, lumbar region: Secondary | ICD-10-CM | POA: Diagnosis present

## 2021-08-27 DIAGNOSIS — M5136 Other intervertebral disc degeneration, lumbar region: Secondary | ICD-10-CM | POA: Diagnosis present

## 2021-08-27 MED ORDER — TRAMADOL HCL 50 MG PO TABS: 50.0000 mg | ORAL_TABLET | Freq: Three times a day (TID) | ORAL | 2 refills | Status: DC | PRN

## 2021-08-27 MED ORDER — BELBUCA 600 MCG BU FILM: 1.0000 | ORAL_FILM | Freq: Two times a day (BID) | BUCCAL | 2 refills | Status: DC | PRN

## 2021-08-27 NOTE — Progress Notes (Signed)
PROVIDER NOTE: Information contained herein reflects review and annotations entered in association with encounter. Interpretation of such information and data should be left to medically-trained personnel. Information provided to patient can be located elsewhere in the medical record under "Patient Instructions". Document created using STT-dictation technology, any transcriptional errors that may result from process are unintentional.    Patient: Shawn Crawford  Service Category: E/M  Provider: Edward Jolly, MD  DOB: Mar 18, 1961  DOS: 08/27/2021  Specialty: Interventional Pain Management  MRN: [id number]  Setting: Ambulatory outpatient  PCP: Maple Hudson., MD  Type: Established Patient    Referring Provider: Maple Hudson.,*  Location: Office  Delivery: Face-to-face     HPI  [name], a 60 y.o. year old male, is here today because of his Chronic pain syndrome [G89.4]. Mr. Saha primary complain today is Back Pain (lower) Last encounter: My last encounter with him was on 02/19/2021. Pertinent problems: Mr. Hinz has Arteriosclerosis of coronary artery; Lumbar radiculopathy; Benign essential HTN; Chronic pain syndrome; Chronic midline low back pain without sciatica; Lumbar facet arthropathy; Lumbar degenerative disc disease; and Piriformis muscle pain on their pertinent problem list. Pain Assessment: Severity of Chronic pain is reported as a 2 /10. Location: Back Right, Left/to knees bilat. Onset: More than a month ago. Quality: Aching. Timing: Intermittent ("Mornings are best, not much pain"). Modifying factor(s): meds. Vitals:  height is 5\' 11"  (1.803 m) and weight is 152 lb (68.9 kg). His temporal temperature is 97 F (36.1 C) (abnormal). His blood pressure is 139/84 and his pulse is 87. His respiration is 15 and oxygen saturation is 100%.   Reason for encounter: medication management.    Patient is doing well overall. Had a small bowel obstruction that he was hospitalized  for.  Managed nonoperatively.  Unclear etiology but seems to be related to scar tissue from prior appendectomy.  Patient's pain is at baseline.  Patient continues multimodal pain regimen as prescribed.  States that it provides pain relief and improvement in functional status.  ROS  Constitutional: Denies any fever or chills Gastrointestinal: No reported hemesis, hematochezia, vomiting, or acute GI distress Musculoskeletal: Denies any acute onset joint swelling, redness, loss of ROM, or weakness Neurological: No reported episodes of acute onset apraxia, aphasia, dysarthria, agnosia, amnesia, paralysis, loss of coordination, or loss of consciousness  Medication Review  Buprenorphine HCl, Multiple Vitamin, amLODipine, aspirin EC, desonide, ketoconazole, ondansetron, rosuvastatin, tadalafil, testosterone cypionate, traMADol, and zolpidem  History Review  Allergy: Mr. Favila is allergic to penicillins. Drug: Mr. Xiang  reports no history of drug use. Alcohol:  reports current alcohol use. Tobacco:  reports that he has never smoked. He has never used smokeless tobacco. Social: Mr. Conigliaro  reports that he has never smoked. He has never used smokeless tobacco. He reports current alcohol use. He reports that he does not use drugs. Medical:  has a past medical history of Actinic keratosis, Dysplastic nevus (09/06/2014), Dysplastic nevus (07/09/2015), cardiac catheterization, Hyperlipidemia, and Hypertension. Surgical: Mr. Erny  has a past surgical history that includes Coronary artery bypass graft; Hernia repair; Appendectomy; Cardiac catheterization; Colonoscopy with propofol (N/A, 06/24/2018); Esophagogastroduodenoscopy (egd) with propofol (N/A, 06/24/2018); and Exam under anesthesia with hemorrhoidectomy (N/A, 11/11/2018). Family: family history includes Colon cancer in his sister; Diabetes in his father; Hyperlipidemia in his mother; Hypertension in his father; Thyroid cancer in his sister.  Laboratory  Chemistry Profile   Renal Lab Results  Component Value Date   BUN 21 06/15/2021  CREATININE 1.20 06/15/2021   BCR 13 04/18/2020   GFRAA 96 04/18/2020   GFRNONAA >60 06/15/2021     Hepatic Lab Results  Component Value Date   AST 18 06/14/2021   ALT 19 06/14/2021   ALBUMIN 3.6 06/14/2021   ALKPHOS 42 06/14/2021   LIPASE 23 06/14/2021     Electrolytes Lab Results  Component Value Date   NA 134 (L) 06/15/2021   K 3.6 06/15/2021   CL 94 (L) 06/15/2021   CALCIUM 8.1 (L) 06/15/2021   MG 2.0 06/15/2021   PHOS 2.5 06/15/2021     Bone Lab Results  Component Value Date   TESTOFREE 5.1 (L) 11/09/2019   TESTOSTERONE 263 (L) 08/16/2021     Inflammation (CRP: Acute Phase) (ESR: Chronic Phase) Lab Results  Component Value Date   ESRSEDRATE 2 08/06/2018       Note: Above Lab results reviewed.  Recent Imaging Review  DG Abd 1 View CLINICAL DATA:  Follow up small bowel obstruction.  EXAM: ABDOMEN - 1 VIEW  COMPARISON:  06/14/2021 CT  FINDINGS: Distended small bowel loops have decreased in caliber from 06/14/2021 CT.  UPPER limits of normal small bowel caliber loops are noted.  Gas in the colon and rectum is present.  No suspicious calcifications are identified.  No acute bony abnormalities are noted.  IMPRESSION: Improving small bowel dilatation since 06/14/2021 CT.  Electronically Signed   By: Margarette Canada M.D.   On: 06/15/2021 10:47 Note: Reviewed        Physical Exam  General appearance: Well nourished, well developed, and well hydrated. In no apparent acute distress Mental status: Alert, oriented x 3 (person, place, & time)       Respiratory: No evidence of acute respiratory distress Eyes: PERLA Vitals: BP 139/84   Pulse 87   Temp (!) 97 F (36.1 C) (Temporal)   Resp 15   Ht $R'5\' 11"'oX$  (1.803 m)   Wt 152 lb (68.9 kg)   SpO2 100%   BMI 21.20 kg/m  BMI: Estimated body mass index is 21.2 kg/m as calculated from the following:   Height as of this  encounter: $RemoveBef'5\' 11"'QbrSJMdJoR$  (1.803 m).   Weight as of this encounter: 152 lb (68.9 kg). Ideal: Ideal body weight: 75.3 kg (166 lb 0.1 oz)  Assessment   Status Diagnosis  Controlled Controlled Controlled 1. Chronic pain syndrome   2. Lumbar radiculopathy   3. Lumbar facet arthropathy   4. Chronic midline low back pain without sciatica   5. Lumbar degenerative disc disease   6. Piriformis muscle pain       Plan of Care   Mr. UMER HARIG has a current medication list which includes the following long-term medication(s): amlodipine, rosuvastatin, tadalafil, testosterone cypionate, zolpidem, and tramadol.  Pharmacotherapy (Medications Ordered): Meds ordered this encounter  Medications   Buprenorphine HCl (BELBUCA) 600 MCG FILM    Sig: Place 1 Film inside cheek every 12 (twelve) hours as needed. For chronic pain syndrome    Dispense:  60 each    Refill:  2   traMADol (ULTRAM) 50 MG tablet    Sig: Take 1 tablet (50 mg total) by mouth every 8 (eight) hours as needed for severe pain. Month last 30 days.    Dispense:  90 tablet    Refill:  2    [place] STOP ACT - Not applicable. Fill one day early if pharmacy is closed on scheduled refill date. .    Follow-up plan:   Return  in about 3 months (around 11/26/2021) for Medication Management, in person.   Recent Visits No visits were found meeting these conditions. Showing recent visits within past 90 days and meeting all other requirements Today's Visits Date Type Provider Dept  08/27/21 Office Visit Gillis Santa, MD [facility]  Showing today's visits and meeting all other requirements Future Appointments Date Type Provider Dept  11/19/21 Appointment Gillis Santa, MD [facility]  Showing future appointments within next 90 days and meeting all other requirements I discussed the assessment and treatment plan with the patient. The patient was provided an opportunity to ask questions and all were answered. The patient agreed  with the plan and demonstrated an understanding of the instructions.  Patient advised to call back or seek an in-person evaluation if the symptoms or condition worsens.  Duration of encounter: 30 minutes.  Note by: Gillis Santa, MD Date: 08/27/2021; Time: 10:17 AM

## 2021-08-27 NOTE — Progress Notes (Signed)
Nursing Pain Medication Assessment:  Safety precautions to be maintained throughout the outpatient stay will include: orient to surroundings, keep bed in low position, maintain call bell within reach at all times, provide assistance with transfer out of bed and ambulation.  Medication Inspection Compliance: Pill count conducted under aseptic conditions, in front of the patient. Neither the pills nor the bottle was removed from the patient's sight at any time. Once count was completed pills were immediately returned to the patient in their original bottle.  Medication #1: Buprenorphine (Suboxone) Pill/Patch Count:  26 of 60 patches remain Pill/Patch Appearance: Markings consistent with prescribed medication Bottle Appearance: Standard pharmacy container. Clearly labeled. Filled Date: 48 / 09 / 2022 Last Medication intake:  Today  Medication #2: Tramadol (Ultram) Pill/Patch Count:  71 of 90 pills remain Pill/Patch Appearance: Markings consistent with prescribed medication Bottle Appearance: Standard pharmacy container. Clearly labeled. Filled Date: 85 / 10 /  2022 Last Medication intake:  Yesterday

## 2021-08-29 ENCOUNTER — Ambulatory Visit: Payer: 59 | Admitting: Dermatology

## 2021-08-29 ENCOUNTER — Other Ambulatory Visit: Payer: Self-pay

## 2021-08-29 DIAGNOSIS — L570 Actinic keratosis: Secondary | ICD-10-CM | POA: Diagnosis not present

## 2021-08-29 DIAGNOSIS — L821 Other seborrheic keratosis: Secondary | ICD-10-CM

## 2021-08-29 DIAGNOSIS — L578 Other skin changes due to chronic exposure to nonionizing radiation: Secondary | ICD-10-CM

## 2021-08-29 NOTE — Patient Instructions (Signed)

## 2021-08-29 NOTE — Progress Notes (Signed)
   Follow-Up Visit   Subjective  Shawn Crawford is a 60 y.o. 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 The patient has spots, moles and lesions to be evaluated, some may be new or changing and the patient has concerns that these could be cancer.  The following portions of the chart were reviewed this encounter and updated as appropriate:   Tobacco  Allergies  Meds  Problems  Med Hx  Surg Hx  Fam Hx     Review of Systems:  No other skin or systemic complaints except as noted in HPI or Assessment and Plan.  Objective  Well appearing patient in no apparent distress; mood and affect are within normal limits.  A focused examination was performed including face, scalp, ears, arms. Relevant physical exam findings are noted in the Assessment and Plan.  Scalp, tops of ears Pink scaly macules    Assessment & Plan   Actinic Damage - Severe, confluent actinic changes with pre-cancerous actinic keratoses  - Severe, chronic, not at goal, secondary to cumulative UV radiation exposure over time - diffuse scaly erythematous macules and papules with underlying dyspigmentation - Discussed Prescription "Field Treatment" for Severe, Chronic Confluent Actinic Changes with Pre-Cancerous Actinic Keratoses Field treatment involves treatment of an entire area of skin that has confluent Actinic Changes (Sun/ Ultraviolet light damage) and PreCancerous Actinic Keratoses by method of PhotoDynamic Therapy (PDT) and/or prescription Topical Chemotherapy agents such as 5-fluorouracil, 5-fluorouracil/calcipotriene, and/or imiquimod.  The purpose is to decrease the number of clinically evident and subclinical PreCancerous lesions to prevent progression to development of skin cancer by chemically destroying early precancer changes that may or may not be visible.  It has been shown to reduce the risk of developing skin  cancer in the treated area. As a result of treatment, redness, scaling, crusting, and open sores may occur during treatment course. One or more than one of these methods may be used and may have to be used several times to control, suppress and eliminate the PreCancerous changes. Discussed treatment course, expected reaction, and possible side effects. - Recommend daily broad spectrum sunscreen SPF 30+ to sun-exposed areas, reapply every 2 hours as needed.  - Staying in the shade or wearing long sleeves, sun glasses (UVA+UVB protection) and wide brim hats (4-inch brim around the entire circumference of the hat) are also recommended. - Call for new or changing lesions.  - Pt will start 5FU/Calcipotriene cr bid to scalp and tops of ears for 7 days.  Pt has cream at home.  AK (actinic keratosis) Scalp, tops of ears  Pt will start 5FU/Calcipotriene cream bid for 7 days to scalp and top of ears after holidays.  Pt has cream at home.  Seborrheic Keratoses - Stuck-on, waxy, tan-brown papules and/or plaques  - Benign-appearing - Discussed benign etiology and prognosis. - Observe - Call for any changes  Return in about 6 months (around 02/27/2022) for AK f/u.  I, Othelia Pulling, RMA, am acting as scribe for Sarina Ser, MD . Documentation: I have reviewed the above documentation for accuracy and completeness, and I agree with the above.  Sarina Ser, MD

## 2021-09-02 LAB — TOXASSURE SELECT 13 (MW), URINE

## 2021-09-10 ENCOUNTER — Encounter: Payer: Self-pay | Admitting: Dermatology

## 2021-09-15 ENCOUNTER — Other Ambulatory Visit: Payer: Self-pay | Admitting: Family Medicine

## 2021-09-15 DIAGNOSIS — I10 Essential (primary) hypertension: Secondary | ICD-10-CM

## 2021-09-15 NOTE — Telephone Encounter (Signed)
Requested Prescriptions  Pending Prescriptions Disp Refills   amLODipine (NORVASC) 5 MG tablet [Pharmacy Med Name: amLODIPine Besylate 5 MG Oral Tablet] 90 tablet 0    Sig: TAKE 1 TABLET BY MOUTH  DAILY     Cardiovascular:  Calcium Channel Blockers Passed - 09/15/2021  5:02 AM      Passed - Last BP in normal range    BP Readings from Last 1 Encounters:  08/27/21 139/84         Passed - Valid encounter within last 6 months    Recent Outpatient Visits          1 month ago Annual physical exam   [facility] Jerrol Banana., MD   7 months ago [place] Jerrol Banana., MD   9 months ago ED (erectile dysfunction) of organic origin   [facility] Rosanna Randy, Retia Passe., MD   1 year ago [place] Jerrol Banana., MD   1 year ago Annual physical exam   [facility] Jerrol Banana., MD      Future Appointments            In 4 months Jerrol Banana., MD [facility] [place]   In 5 months Ralene Bathe, MD [place]

## 2021-09-17 ENCOUNTER — Encounter: Payer: Self-pay | Admitting: Dermatology

## 2021-11-07 ENCOUNTER — Other Ambulatory Visit: Payer: Self-pay | Admitting: Student in an Organized Health Care Education/Training Program

## 2021-11-07 DIAGNOSIS — G894 Chronic pain syndrome: Secondary | ICD-10-CM

## 2021-11-11 ENCOUNTER — Other Ambulatory Visit: Payer: Self-pay | Admitting: Family Medicine

## 2021-11-11 DIAGNOSIS — E785 Hyperlipidemia, unspecified: Secondary | ICD-10-CM

## 2021-11-19 ENCOUNTER — Ambulatory Visit
Payer: 59 | Attending: Student in an Organized Health Care Education/Training Program | Admitting: Student in an Organized Health Care Education/Training Program

## 2021-11-19 ENCOUNTER — Encounter: Payer: Self-pay | Admitting: Student in an Organized Health Care Education/Training Program

## 2021-11-19 ENCOUNTER — Other Ambulatory Visit: Payer: Self-pay

## 2021-11-19 VITALS — BP 125/75 | HR 68 | Temp 97.9°F | Ht 71.0 in | Wt 152.0 lb

## 2021-11-19 DIAGNOSIS — M5136 Other intervertebral disc degeneration, lumbar region: Secondary | ICD-10-CM | POA: Insufficient documentation

## 2021-11-19 DIAGNOSIS — G8929 Other chronic pain: Secondary | ICD-10-CM | POA: Diagnosis present

## 2021-11-19 DIAGNOSIS — M7918 Myalgia, other site: Secondary | ICD-10-CM | POA: Diagnosis present

## 2021-11-19 DIAGNOSIS — M47816 Spondylosis without myelopathy or radiculopathy, lumbar region: Secondary | ICD-10-CM | POA: Diagnosis present

## 2021-11-19 DIAGNOSIS — G894 Chronic pain syndrome: Secondary | ICD-10-CM | POA: Insufficient documentation

## 2021-11-19 DIAGNOSIS — M545 Low back pain, unspecified: Secondary | ICD-10-CM | POA: Insufficient documentation

## 2021-11-19 DIAGNOSIS — M5416 Radiculopathy, lumbar region: Secondary | ICD-10-CM | POA: Diagnosis present

## 2021-11-19 MED ORDER — BELBUCA 600 MCG BU FILM: 1.0000 | ORAL_FILM | Freq: Two times a day (BID) | BUCCAL | 2 refills | Status: DC | PRN

## 2021-11-19 MED ORDER — TRAMADOL HCL 50 MG PO TABS: 50.0000 mg | ORAL_TABLET | Freq: Three times a day (TID) | ORAL | 2 refills | Status: DC | PRN

## 2021-11-19 NOTE — Progress Notes (Signed)
PROVIDER NOTE: Information contained herein reflects review and annotations entered in association with encounter. Interpretation of such information and data should be left to medically-trained personnel. Information provided to patient can be located elsewhere in the medical record under "Patient Instructions". Document created using STT-dictation technology, any transcriptional errors that may result from process are unintentional.    Patient: Shawn Crawford  Service Category: E/M  Provider: Gillis Santa, MD  DOB: March 12, 1961  DOS: 11/19/2021  Specialty: Interventional Pain Management  MRN: [id number]  Setting: Ambulatory outpatient  PCP: Jerrol Banana., MD  Type: Established Patient    Referring Provider: Jerrol Banana.,*  Location: Office  Delivery: Face-to-face     HPI  Shawn Crawford, a 61 y.o. year old male, is here today because of his Chronic pain syndrome [G89.4]. Shawn Crawford primary complain today is Back Pain  Last encounter: My last encounter with him was on  08/27/22 Pertinent problems: Shawn Crawford has Arteriosclerosis of coronary artery; Lumbar radiculopathy; Benign essential HTN; Chronic pain syndrome; Chronic midline low back pain without sciatica; Lumbar facet arthropathy; Lumbar degenerative disc disease; and Piriformis muscle pain on their pertinent problem list. Pain Assessment: Severity of Chronic pain is reported as a 2 /10. Location: Back Right, Left/pain radiaties down both leg to his knee. Onset: More than a month ago. Quality: Burning, Constant. Timing: Constant. Modifying factor(s): Meds and patches. Vitals:  height is _0  (1.803 m) and weight is 152 lb (68.9 kg). His temperature is 97.9 F (36.6 C). His blood pressure is 125/75 and his pulse is 68. His oxygen saturation is 100%.   Reason for encounter: medication management.    Patient is doing well overall. Patient's pain is at baseline.  Patient continues multimodal pain regimen as prescribed.  States  that it provides pain relief and improvement in functional status. Under more stress, has been journaling more.  ROS  Constitutional: Denies any fever or chills Gastrointestinal: No reported hemesis, hematochezia, vomiting, or acute GI distress Musculoskeletal: Denies any acute onset joint swelling, redness, loss of ROM, or weakness Neurological: No reported episodes of acute onset apraxia, aphasia, dysarthria, agnosia, amnesia, paralysis, loss of coordination, or loss of consciousness  Medication Review  Buprenorphine HCl, Multiple Vitamin, amLODipine, aspirin EC, desonide, ketoconazole, ondansetron, rosuvastatin, tadalafil, testosterone cypionate, traMADol, and zolpidem  History Review  Allergy: Shawn Crawford is allergic to penicillins. Drug: Shawn Crawford  reports no history of drug use. Alcohol:  reports current alcohol use. Tobacco:  reports that he has never smoked. He has never used smokeless tobacco. Social: Shawn Crawford  reports that he has never smoked. He has never used smokeless tobacco. He reports current alcohol use. He reports that he does not use drugs. Medical:  has a past medical history of Actinic keratosis, Dysplastic nevus (09/06/2014), Dysplastic nevus (07/09/2015), cardiac catheterization, Hyperlipidemia, and Hypertension. Surgical: Shawn Crawford  has a past surgical history that includes Coronary artery bypass graft; Hernia repair; Appendectomy; Cardiac catheterization; Colonoscopy with propofol (N/A, 06/24/2018); Esophagogastroduodenoscopy (egd) with propofol (N/A, 06/24/2018); and Exam under anesthesia with hemorrhoidectomy (N/A, 11/11/2018). Family: family history includes Colon cancer in his sister; Diabetes in his father; Hyperlipidemia in his mother; Hypertension in his father; Thyroid cancer in his sister.  Laboratory Chemistry Profile   Renal Lab Results  Component Value Date   BUN 21 06/15/2021   CREATININE 1.20 06/15/2021   BCR 13 04/18/2020   GFRAA 96 04/18/2020   GFRNONAA  >60 06/15/2021     Hepatic Lab  Results  Component Value Date   AST 18 06/14/2021   ALT 19 06/14/2021   ALBUMIN 3.6 06/14/2021   ALKPHOS 42 06/14/2021   LIPASE 23 06/14/2021     Electrolytes Lab Results  Component Value Date   NA 134 (L) 06/15/2021   K 3.6 06/15/2021   CL 94 (L) 06/15/2021   CALCIUM 8.1 (L) 06/15/2021   MG 2.0 06/15/2021   PHOS 2.5 06/15/2021     Bone Lab Results  Component Value Date   TESTOFREE 5.1 (L) 11/09/2019   TESTOSTERONE 263 (L) 08/16/2021     Inflammation (CRP: Acute Phase) (ESR: Chronic Phase) Lab Results  Component Value Date   ESRSEDRATE 2 08/06/2018       Note: Above Lab results reviewed.  Recent Imaging Review  DG Abd 1 View CLINICAL DATA:  Follow up small bowel obstruction.  EXAM: ABDOMEN - 1 VIEW  COMPARISON:  06/14/2021 CT  FINDINGS: Distended small bowel loops have decreased in caliber from 06/14/2021 CT.  UPPER limits of normal small bowel caliber loops are noted.  Gas in the colon and rectum is present.  No suspicious calcifications are identified.  No acute bony abnormalities are noted.  IMPRESSION: Improving small bowel dilatation since 06/14/2021 CT.  Electronically Signed   By: Margarette Canada M.D.   On: 06/15/2021 10:47  Note: Reviewed        Physical Exam  General appearance: Well nourished, well developed, and well hydrated. In no apparent acute distress Mental status: Alert, oriented x 3 (person, place, & time)       Respiratory: No evidence of acute respiratory distress Eyes: PERLA Vitals: BP 125/75    Pulse 68    Temp 97.9 F (36.6 C)    Ht _0  (1.803 m)    Wt 152 lb (68.9 kg)    SpO2 100%    BMI 21.20 kg/m  BMI: Estimated body mass index is 21.2 kg/m as calculated from the following:   Height as of this encounter: _1  (1.803 m).   Weight as of this encounter: 152 lb (68.9 kg). Ideal: Ideal body weight: 75.3 kg (166 lb 0.1 oz)  Assessment   Status Diagnosis   Controlled Controlled Controlled 1. Chronic pain syndrome   2. Lumbar radiculopathy   3. Lumbar facet arthropathy   4. Chronic midline low back pain without sciatica   5. Lumbar degenerative disc disease   6. Piriformis muscle pain       Plan of Care   Mr. TAAVI HOOSE has a current medication list which includes the following long-term medication(s): amlodipine, rosuvastatin, tadalafil, testosterone cypionate, zolpidem, and [START ON 12/07/2021] tramadol.  Pharmacotherapy (Medications Ordered): Meds ordered this encounter  Medications   Buprenorphine HCl (BELBUCA) 600 MCG FILM    Sig: Place 1 Film inside cheek every 12 (twelve) hours as needed. For chronic pain syndrome    Dispense:  60 each    Refill:  2   traMADol (ULTRAM) 50 MG tablet    Sig: Take 1 tablet (50 mg total) by mouth every 8 (eight) hours as needed for severe pain. Month last 30 days.    Dispense:  90 tablet    Refill:  2    [place] STOP ACT - Not applicable. Fill one day early if pharmacy is closed on scheduled refill date. .    Follow-up plan:   Return in about 3 months (around 02/16/2022) for Medication Management, in person.   Recent Visits Date Type Provider Dept  08/27/21 Office Visit Gillis Santa, MD [facility]  Showing recent visits within past 90 days and meeting all other requirements Today's Visits Date Type Provider Dept  11/19/21 Office Visit Gillis Santa, MD [facility]  Showing today's visits and meeting all other requirements Future Appointments Date Type Provider Dept  02/13/22 Appointment Gillis Santa, MD [facility]  Showing future appointments within next 90 days and meeting all other requirements  I discussed the assessment and treatment plan with the patient. The patient was provided an opportunity to ask questions and all were answered. The patient agreed with the plan and demonstrated an understanding of the instructions.  Patient advised to call back  or seek an in-person evaluation if the symptoms or condition worsens.  Duration of encounter: 30 minutes.  Note by: Gillis Santa, MD Date: 11/19/2021; Time: 9:50 AM

## 2021-11-19 NOTE — Progress Notes (Signed)
Nursing Pain Medication Assessment:  Safety precautions to be maintained throughout the outpatient stay will include: orient to surroundings, keep bed in low position, maintain call bell within reach at all times, provide assistance with transfer out of bed and ambulation.  Medication Inspection Compliance: Pill count conducted under aseptic conditions, in front of the patient. Neither the pills nor the bottle was removed from the patient's sight at any time. Once count was completed pills were immediately returned to the patient in their original bottle.  Medication #1: Tramadol (Ultram) Pill/Patch Count:  66 of 90 pills remain Pill/Patch Appearance: Markings consistent with prescribed medication Bottle Appearance: Standard pharmacy container. Clearly labeled. Filled Date: 2/ 11 / 2023 Last Medication intake:  Today  Medication #2: Buprenorphine (Suboxone) Pill/Patch Count:  41 of 60 pills remain Pill/Patch Appearance: Markings consistent with prescribed medication Bottle Appearance: Standard pharmacy container. Clearly labeled. Filled Date: 2 / 9 / 2023 Last Medication intake:  TodaySafety precautions to be maintained throughout the outpatient stay will include: orient to surroundings, keep bed in low position, maintain call bell within reach at all times, provide assistance with transfer out of bed and ambulation.

## 2021-12-02 ENCOUNTER — Encounter: Payer: Self-pay | Admitting: Family Medicine

## 2021-12-27 ENCOUNTER — Ambulatory Visit: Payer: 59 | Admitting: Family Medicine

## 2021-12-27 ENCOUNTER — Encounter: Payer: Self-pay | Admitting: Family Medicine

## 2021-12-27 VITALS — BP 130/83 | HR 88 | Temp 98.6°F | Resp 12 | Wt 158.0 lb

## 2021-12-27 DIAGNOSIS — G8929 Other chronic pain: Secondary | ICD-10-CM

## 2021-12-27 DIAGNOSIS — M79605 Pain in left leg: Secondary | ICD-10-CM

## 2021-12-27 DIAGNOSIS — R1031 Right lower quadrant pain: Secondary | ICD-10-CM | POA: Diagnosis not present

## 2021-12-27 DIAGNOSIS — K4090 Unilateral inguinal hernia, without obstruction or gangrene, not specified as recurrent: Secondary | ICD-10-CM | POA: Diagnosis not present

## 2021-12-27 DIAGNOSIS — M79604 Pain in right leg: Secondary | ICD-10-CM

## 2021-12-27 DIAGNOSIS — M79606 Pain in leg, unspecified: Secondary | ICD-10-CM | POA: Insufficient documentation

## 2021-12-27 NOTE — Assessment & Plan Note (Signed)
Hx of acute R inguinal pain upon leaving the car; pt described as a burning ?Intermittently has had associated symptoms since ?

## 2021-12-27 NOTE — Assessment & Plan Note (Signed)
Small, reducible R inguinal hernia ?Has not extended into scrotal at this time ?Hx of L inguinal hernia with repair, ? Mesh ?Will send for surgical cx ?

## 2021-12-27 NOTE — Assessment & Plan Note (Signed)
Chronic, worsening ?Chronic use of statin- not linked, pt has taken "statin holidays" and symptoms have not changed ?Recommend follow up with vascular for dopplers of BLE and ABI given chronic leg pain  ?

## 2021-12-27 NOTE — Progress Notes (Signed)
?  ? ? ?I,Roshena L Chambers,acting as a scribe for Gwyneth Sprout, FNP.,have documented all relevant documentation on the behalf of Gwyneth Sprout, FNP,as directed by  Gwyneth Sprout, FNP while in the presence of Gwyneth Sprout, FNP.  ? ?Established patient visit ? ? ?Patient: Shawn Crawford   DOB: 03-22-1961   61 y.o. Male  MRN: [id number] ?Visit Date: 12/27/2021 ? ?Today's healthcare provider: Gwyneth Sprout, FNP  ?Introduced to Designer, jewellery role and practice setting.  All questions answered.  Discussed provider/patient relationship and expectations. ? ? ?Chief Complaint  ?Patient presents with  ? Leg Pain  ? ?Subjective  ?  ?Leg Pain  ?Incident onset: 2 months ago. There was no injury mechanism. The pain is present in the left leg and right leg. The quality of the pain is described as burning (tingling). The pain has been Constant since onset. Treatments tried: laying on side.   ?Patient reports having a bulge in his right inguinal area. He reports having a past history of left inguinal hernia, which was surgically repaired.  ? ?Medications: ?Outpatient Medications Prior to Visit  ?Medication Sig  ? amLODipine (NORVASC) 5 MG tablet TAKE 1 TABLET BY MOUTH  DAILY  ? aspirin EC 81 MG tablet Take 81 mg by mouth daily.  ? Buprenorphine HCl (BELBUCA) 600 MCG FILM Place 1 Film inside cheek every 12 (twelve) hours as needed. For chronic pain syndrome  ? desonide (DESOWEN) 0.05 % ointment Apply topically 3 (three) times a week. Apply topically on Mondays, Wednesdays, and Fridays.  ? ketoconazole (NIZORAL) 2 % cream Apply topically on Tuesdays, Thursdays, and Saturdays.  ? MULTIPLE VITAMINS PO Take 1 tablet by mouth daily.   ? rosuvastatin (CRESTOR) 40 MG tablet TAKE 1 TABLET BY MOUTH  DAILY  ? tadalafil (CIALIS) 5 MG tablet Take 1 tablet (5 mg total) by mouth daily.  ? testosterone cypionate (DEPOTESTOSTERONE CYPIONATE) 200 MG/ML injection Inject 1 mL (200 mg total) into the muscle every 14 (fourteen) days. Can try to go  to 18 days  ? traMADol (ULTRAM) 50 MG tablet Take 1 tablet (50 mg total) by mouth every 8 (eight) hours as needed for severe pain. Month last 30 days.  ? zolpidem (AMBIEN CR) 12.5 MG CR tablet TAKE ONE TABLET BY MOUTH AT BEDTIME AS NEEDED FOR SLEEP  ? [DISCONTINUED] ondansetron (ZOFRAN ODT) 4 MG disintegrating tablet Take 1 tablet (4 mg total) by mouth every 8 (eight) hours as needed for nausea or vomiting.  ? ?No facility-administered medications prior to visit.  ? ? ?Review of Systems  ?Constitutional:  Negative for appetite change, chills and fever.  ?Respiratory:  Negative for chest tightness, shortness of breath and wheezing.   ?Cardiovascular:  Negative for chest pain and palpitations.  ?Gastrointestinal:  Negative for abdominal pain, nausea and vomiting.  ? ? ?  Objective  ?  ?BP 130/83 (BP Location: Right Arm, Patient Position: Sitting, Cuff Size: Normal)   Pulse 88   Temp 98.6 ?F (37 ?C) (Oral)   Resp 12   Wt 158 lb (71.7 kg)   SpO2 99% Comment: room air  BMI 22.04 kg/m?  ? ? ?Physical Exam ?Vitals and nursing note reviewed.  ?Constitutional:   ?   Appearance: Normal appearance. He is normal weight.  ?HENT:  ?   Head: Normocephalic and atraumatic.  ?Eyes:  ?   Pupils: Pupils are equal, round, and reactive to light.  ?Cardiovascular:  ?   Rate and Rhythm: Normal  rate and regular rhythm.  ?   Pulses:     ?     Dorsalis pedis pulses are 2+ on the right side and 2+ on the left side.  ?   Heart sounds: Normal heart sounds.  ?Pulmonary:  ?   Effort: Pulmonary effort is normal.  ?   Breath sounds: Normal breath sounds.  ?Abdominal:  ?   Palpations: Abdomen is soft.  ?   Tenderness: There is abdominal tenderness.  ?   Hernia: A hernia is present.  ? ? ?   Comments: R inguinal hernia; minor ?Able to be reduced ?Pt with complaints of chronic leg pain ?Hx of appendix removal with apparent scarring   ?Musculoskeletal:     ?   General: Normal range of motion.  ?   Cervical back: Normal range of motion.  ?   Right  lower leg: No edema.  ?   Left lower leg: No edema.  ?Skin: ?   General: Skin is warm and dry.  ?   Capillary Refill: Capillary refill takes less than 2 seconds.  ?Neurological:  ?   General: No focal deficit present.  ?   Mental Status: He is alert and oriented to person, place, and time. Mental status is at baseline.  ?  ? ?No results found for any visits on 12/27/21. ? Assessment & Plan  ?  ? ?Problem List Items Addressed This Visit   ? ?  ? Other  ? Chronic pain of both lower extremities  ?  Chronic, worsening ?Chronic use of statin- not linked, pt has taken "statin holidays" and symptoms have not changed ?Recommend follow up with vascular for dopplers of BLE and ABI given chronic leg pain  ?  ?  ? Relevant Orders  ? Ambulatory referral to Vascular Surgery  ? Right inguinal hernia  ?  Small, reducible R inguinal hernia ?Has not extended into scrotal at this time ?Hx of L inguinal hernia with repair, ? Mesh ?Will send for surgical cx ?  ?  ? Relevant Orders  ? Ambulatory referral to General Surgery  ? Right inguinal pain - Primary  ?  Hx of acute R inguinal pain upon leaving the car; pt described as a burning ?Intermittently has had associated symptoms since ?  ?  ? Relevant Orders  ? Ambulatory referral to General Surgery  ? ? ? ?Return in about 7 months (around 07/29/2022) for annual examination.  ?   ? ?I, Gwyneth Sprout, FNP, have reviewed all documentation for this visit. The documentation on 12/27/21 for the exam, diagnosis, procedures, and orders are all accurate and complete. ? ? ? ?Gwyneth Sprout, FNP  ?[place] ?[phone] (phone) ?[phone] (fax) ? ?[facility]  ?

## 2022-01-07 ENCOUNTER — Other Ambulatory Visit: Payer: Self-pay | Admitting: Family Medicine

## 2022-01-08 NOTE — Telephone Encounter (Signed)
Requested medication (s) are due for refill today: yes  ? ?Requested medication (s) are on the active medication list: yes ? ?Last refill:  07/10/21 #30/5 ? ?Future visit scheduled: yes ? ?Notes to clinic:  Unable to refill per protocol, cannot delegate. ? ? ?  ?Requested Prescriptions  ?Pending Prescriptions Disp Refills  ? zolpidem (AMBIEN CR) 12.5 MG CR tablet [Pharmacy Med Name: ZOLPIDEM TARTRATE ER 12.5 MG TAB] 30 tablet   ?  Sig: TAKE ONE TABLET BY MOUTH AT BEDTIME AS NEEDED FOR SLEEP  ?  ? Not Delegated - Psychiatry:  Anxiolytics/Hypnotics Failed - 01/07/2022  8:37 PM  ?  ?  Failed - This refill cannot be delegated  ?  ?  Failed - Urine Drug Screen completed in last 360 days  ?  ?  Passed - Valid encounter within last 6 months  ?  Recent Outpatient Visits   ? ?      ? 1 week ago Right inguinal pain  ? [facility] Gwyneth Sprout, FNP  ? 5 months ago Annual physical exam  ? [facility] Jerrol Banana., MD  ? 11 months ago Low testosterone  ? [facility] Jerrol Banana., MD  ? 1 year ago ED (erectile dysfunction) of organic origin  ? [facility] Jerrol Banana., MD  ? 1 year ago Low testosterone  ? [facility] Jerrol Banana., MD  ? ?  ?  ?Future Appointments   ? ?        ? In 2 weeks Jerrol Banana., MD [facility]  ? In 1 month Ralene Bathe, MD [place]  ? ?  ? ?  ?  ?  ? ?

## 2022-01-09 ENCOUNTER — Ambulatory Visit: Payer: 59 | Admitting: Surgery

## 2022-01-09 ENCOUNTER — Encounter: Payer: Self-pay | Admitting: Family Medicine

## 2022-01-09 ENCOUNTER — Encounter: Payer: Self-pay | Admitting: Surgery

## 2022-01-09 VITALS — BP 135/80 | HR 88 | Temp 98.2°F | Ht 73.0 in | Wt 157.2 lb

## 2022-01-09 DIAGNOSIS — K4090 Unilateral inguinal hernia, without obstruction or gangrene, not specified as recurrent: Secondary | ICD-10-CM

## 2022-01-09 NOTE — Patient Instructions (Addendum)
Our surgery scheduler Barbara will call you within 24-48 hours to get you scheduled. If you have not heard from her after 48 hours, please call our office. Have the blue sheet available when she calls to write down important information.   If you have any concerns or questions, please feel free to call our office.  Inguinal Hernia, Adult An inguinal hernia is when fat or your intestines push through a weak spot in a muscle where your leg meets your lower belly (groin). This causes a bulge. This kind of hernia could also be: In your scrotum, if you are male. In folds of skin around your vagina, if you are male. There are three types of inguinal hernias: Hernias that can be pushed back into the belly (are reducible). This type rarely causes pain. Hernias that cannot be pushed back into the belly (are incarcerated). Hernias that cannot be pushed back into the belly and lose their blood supply (are strangulated). This type needs emergency surgery. What are the causes? This condition is caused by having a weak spot in the muscles or tissues in your groin. This develops over time. The hernia may poke through the weak spot when you strain your lower belly muscles all of a sudden, such as when you: Lift a heavy object. Strain to poop (have a bowel movement). Trouble pooping (constipation) can lead to straining. Cough. What increases the risk? This condition is more likely to develop in: Males. Pregnant females. People who: Are overweight. Work in jobs that require long periods of standing or heavy lifting. Have had an inguinal hernia before. Smoke or have lung disease. These factors can lead to long-term (chronic) coughing. What are the signs or symptoms? Symptoms may depend on the size of the hernia. Often, a small hernia has no symptoms. Symptoms of a larger hernia may include: A bulge in the groin area. This is easier to see when standing. You might not be able to see it when you are lying  down. Pain or burning in the groin. This may get worse when you lift, strain, or cough. A Burstein ache or a feeling of pressure in the groin. An abnormal bulge in the scrotum, in males. Symptoms of a strangulated inguinal hernia may include: A bulge in your groin that is very painful and tender to the touch. A bulge that turns red or purple. Fever, feeling like you may vomit (nausea), and vomiting. Not being able to poop or to pass gas. How is this treated? Treatment depends on the size of your hernia and whether you have symptoms. If you do not have symptoms, your doctor may have you watch your hernia carefully and have you come in for follow-up visits. If your hernia is large or if you have symptoms, you may need surgery to repair the hernia. Follow these instructions at home: Lifestyle Avoid lifting heavy objects. Avoid standing for long amounts of time. Do not smoke or use any products that contain nicotine or tobacco. If you need help quitting, ask your doctor. Stay at a healthy weight. Prevent trouble pooping You may need to take these actions to prevent or treat trouble pooping: Drink enough fluid to keep your pee (urine) pale yellow. Take over-the-counter or prescription medicines. Eat foods that are high in fiber. These include beans, whole grains, and fresh fruits and vegetables. Limit foods that are high in fat and sugar. These include fried or sweet foods. General instructions You may try to push your hernia back in place   place by very gently pressing on it when you are lying down. Do not try to push the bulge back in if it will not go in easily. ?Watch your hernia for any changes in shape, size, or color. Tell your doctor if you see any changes. ?Take over-the-counter and prescription medicines only as told by your doctor. ?Keep all follow-up visits. ?Contact a doctor if: ?You have a fever or chills. ?You have new symptoms. ?Your symptoms get worse. ?Get help right away if: ?You have pain  in your groin that gets worse all of a sudden. ?You have a bulge in your groin that: ?Gets bigger all of a sudden, and it does not get smaller after that. ?Turns red or purple. ?Is painful when you touch it. ?You are a male, and you have: ?Sudden pain in your scrotum. ?A sudden change in the size of your scrotum. ?You cannot push the hernia back in place by very gently pressing on it when you are lying down. ?You feel like you may vomit, and that feeling does not go away. ?You keep vomiting. ?You have a fast heartbeat. ?You cannot poop or pass gas. ?These symptoms may be an emergency. Get help right away. Call your local emergency services (911 in the [place] ?Do not wait to see if the symptoms will go away. ?Do not drive yourself to the hospital. ?Summary ?An inguinal hernia is when fat or your intestines push through a weak spot in a muscle where your leg meets your lower belly (groin). This causes a bulge. ?If you do not have symptoms, you may not need treatment. If you have symptoms or a large hernia, you may need surgery. ?Avoid lifting heavy objects. Also, avoid standing for long amounts of time. ?Do not try to push the bulge back in if it will not go in easily. ?This information is not intended to replace advice given to you by your health care provider. Make sure you discuss any questions you have with your health care provider. ?Document Revised: 05/15/2020 Document Reviewed: 05/15/2020 ?Elsevier Patient Education ? [place]. ? ?

## 2022-01-10 ENCOUNTER — Telehealth: Payer: Self-pay | Admitting: Surgery

## 2022-01-10 ENCOUNTER — Ambulatory Visit: Payer: Self-pay | Admitting: Surgery

## 2022-01-10 ENCOUNTER — Other Ambulatory Visit: Payer: Self-pay | Admitting: *Deleted

## 2022-01-10 DIAGNOSIS — G8929 Other chronic pain: Secondary | ICD-10-CM

## 2022-01-10 DIAGNOSIS — M5136 Other intervertebral disc degeneration, lumbar region: Secondary | ICD-10-CM

## 2022-01-10 DIAGNOSIS — M5416 Radiculopathy, lumbar region: Secondary | ICD-10-CM

## 2022-01-10 DIAGNOSIS — K4090 Unilateral inguinal hernia, without obstruction or gangrene, not specified as recurrent: Secondary | ICD-10-CM

## 2022-01-10 NOTE — H&P (View-Only) (Signed)
Patient ID: Shawn Crawford, male   DOB: [birth year], 61 y.o.   MRN: [id number] ? ?Chief Complaint: Right inguinal hernia ? ?History of Present Illness ?Shawn Crawford is a 61 y.o. male with right inguinal hernia present for 2 months.  The size of the bulge remains about the same.  Rarely has any pain associated with it, occasionally a burning sensation.  Denies nausea, vomiting, fevers or chills.  Reports no voiding issues urinating normally.. ? ?Past Medical History ?Past Medical History:  ?Diagnosis Date  ? Actinic keratosis   ? Dysplastic nevus 09/06/2014  ? Epigastric at mid line. Moderate to severe atypia, lateral margin involved. Excised 11/07/2014, margins free.  ? Dysplastic nevus 07/09/2015  ? Left lat. epigastric/costal margin. Moderate aypia, limited margins free.   ? Hx of cardiac catheterization   ? Hyperlipidemia   ? Hypertension   ?  ? ? ?Past Surgical History:  ?Procedure Laterality Date  ? APPENDECTOMY    ? CARDIAC CATHETERIZATION    ? COLONOSCOPY WITH PROPOFOL N/A 06/24/2018  ? Procedure: COLONOSCOPY WITH PROPOFOL;  Surgeon: Virgel Manifold, MD;  Location: [facility];  Service: Endoscopy;  Laterality: N/A;  ? CORONARY ARTERY BYPASS GRAFT    ? x 3  ? ESOPHAGOGASTRODUODENOSCOPY (EGD) WITH PROPOFOL N/A 06/24/2018  ? Procedure: ESOPHAGOGASTRODUODENOSCOPY (EGD) WITH PROPOFOL;  Surgeon: Virgel Manifold, MD;  Location: [facility];  Service: Endoscopy;  Laterality: N/A;  ? EVALUATION UNDER ANESTHESIA WITH HEMORRHOIDECTOMY N/A 11/11/2018  ? Procedure: HEMORRHOIDAL LIGATION/PEXY, PARTIAL LATERAL SPINCTEROTOMY;  Surgeon: Michael Boston, MD;  Location: [facility];  Service: General;  Laterality: N/A;  ? HERNIA REPAIR    ? ? ?Allergies  ?Allergen Reactions  ? Penicillins Itching, Other (See Comments) and Rash  ?  Did it involve swelling of the face/tongue/throat, SOB, or low BP? No ?Did it involve sudden or severe rash/hives, skin peeling, or any reaction on the inside of your mouth or nose? No ?Did you need  to seek medical attention at a hospital or doctor's office? Yes ?When did it last happen? Childhood allergy ?If all above answers are ?NO?, may proceed with cephalosporin use. ? ?Can take keflex with no problems ?  ? ? ?Current Outpatient Medications  ?Medication Sig Dispense Refill  ? amLODipine (NORVASC) 5 MG tablet TAKE 1 TABLET BY MOUTH  DAILY 90 tablet 1  ? aspirin EC 81 MG tablet Take 81 mg by mouth daily.    ? Buprenorphine HCl (BELBUCA) 600 MCG FILM Place 1 Film inside cheek every 12 (twelve) hours as needed. For chronic pain syndrome 60 each 2  ? desonide (DESOWEN) 0.05 % ointment Apply topically 3 (three) times a week. Apply topically on Mondays, Wednesdays, and Fridays. 60 g 3  ? ketoconazole (NIZORAL) 2 % cream Apply topically on Tuesdays, Thursdays, and Saturdays. 60 g 3  ? MULTIPLE VITAMINS PO Take 1 tablet by mouth daily.     ? rosuvastatin (CRESTOR) 40 MG tablet TAKE 1 TABLET BY MOUTH  DAILY 90 tablet 3  ? tadalafil (CIALIS) 5 MG tablet Take 1 tablet (5 mg total) by mouth daily. 90 tablet 3  ? testosterone cypionate (DEPOTESTOSTERONE CYPIONATE) 200 MG/ML injection Inject 1 mL (200 mg total) into the muscle every 14 (fourteen) days. Can try to go to 18 days 10 mL 5  ? traMADol (ULTRAM) 50 MG tablet Take 1 tablet (50 mg total) by mouth every 8 (eight) hours as needed for severe pain. Month last 30 days. 90 tablet 2  ? zolpidem (  AMBIEN CR) 12.5 MG CR tablet TAKE ONE TABLET BY MOUTH AT BEDTIME AS NEEDED FOR SLEEP 30 tablet 4  ? ?No current facility-administered medications for this visit.  ? ? ?Family History ?Family History  ?Problem Relation Age of Onset  ? Hyperlipidemia Mother   ? Diabetes Father   ? Hypertension Father   ? Thyroid cancer Sister   ? Colon cancer Sister   ?  ? ? ?Social History ?Social History  ? ?Tobacco Use  ? Smoking status: Never  ? Smokeless tobacco: Never  ?Vaping Use  ? Vaping Use: Never used  ?Substance Use Topics  ? Alcohol use: Yes  ?  Alcohol/week: 0.0 standard drinks  ?   Comment: glass of wine occasionally  ? Drug use: No  ?  ?  ? ? ?Review of Systems  ?Constitutional: Negative.   ?HENT: Negative.    ?Eyes: Negative.   ?Respiratory: Negative.    ?Cardiovascular: Negative.   ?Gastrointestinal: Negative.   ?Genitourinary: Negative.   ?Skin: Negative.   ?Neurological: Negative.   ?Psychiatric/Behavioral: Negative.    ?  ? ?Physical Exam ?Blood pressure 135/80, pulse 88, temperature 98.2 ?F (36.8 ?C), temperature source Oral, height '6\' 1"'$  (1.854 m), weight 157 lb 3.2 oz (71.3 kg), SpO2 99 %. ?Last Weight  Most recent update: 01/09/2022  9:53 AM  ? ? Weight  ?71.3 kg (157 lb 3.2 oz)  ?      ? ?  ? ? ?CONSTITUTIONAL: Well developed, and nourished, appropriately responsive and aware without distress.   ?EYES: Sclera non-icteric.   ?EARS, NOSE, MOUTH AND THROAT:  The oropharynx is clear. Oral mucosa is pink and moist.   Hearing is intact to voice.  ?NECK: Trachea is midline, and there is no jugular venous distension.  ?LYMPH NODES:  Lymph nodes in the neck are not enlarged. ?RESPIRATORY:  Lungs are clear, and breath sounds are equal bilaterally. Normal respiratory effort without pathologic use of accessory muscles. ?CARDIOVASCULAR: Heart is regular in rate and rhythm. ?GI: The abdomen is soft, nontender, and nondistended. There were no palpable masses. I did not appreciate hepatosplenomegaly. There were normal bowel sounds. ?GU: Right groin bulge, readily appreciable, increases with Valsalva.  Left groin without evidence of inguinal hernia.  Testes distended bilaterally. ?MUSCULOSKELETAL:  Symmetrical muscle tone appreciated in all four extremities.    ?SKIN: Skin turgor is normal. No pathologic skin lesions appreciated.  ?NEUROLOGIC:  Motor and sensation appear grossly normal.  Cranial nerves are grossly without defect. ?PSYCH:  Alert and oriented to person, place and time. Affect is appropriate for situation. ? ?Data Reviewed ?I have personally reviewed what is currently available of the  patient's imaging, recent labs and medical records.   ?Labs:  ? ?  Latest Ref Rng & Units 06/15/2021  ?  5:00 AM 06/14/2021  ? 12:18 PM 04/18/2020  ? 10:26 AM  ?CBC  ?WBC 4.0 - 10.5 K/uL 5.1   5.3   6.4    ?Hemoglobin 13.0 - 17.0 g/dL 12.0   15.6   14.4    ?Hematocrit 39.0 - 52.0 % 34.9   42.8   42.0    ?Platelets 150 - 400 K/uL 145   200   173    ? ? ?  Latest Ref Rng & Units 06/15/2021  ?  5:00 AM 06/14/2021  ? 12:18 PM 04/18/2020  ? 10:26 AM  ?CMP  ?Glucose 70 - 99 mg/dL 93   155   127    ?BUN 8 -  23 mg/dL '21   25   13    '$ ?Creatinine 0.61 - 1.24 mg/dL 1.20   1.11   0.99    ?Sodium 135 - 145 mmol/L 134   134   139    ?Potassium 3.5 - 5.1 mmol/L 3.6   2.8   5.0    ?Chloride 98 - 111 mmol/L 94   94   100    ?CO2 22 - 32 mmol/L 33   29   24    ?Calcium 8.9 - 10.3 mg/dL 8.1   7.7   9.6    ?Total Protein 6.5 - 8.1 g/dL  6.4   6.9    ?Total Bilirubin 0.3 - 1.2 mg/dL  1.1   0.2    ?Alkaline Phos 38 - 126 U/L  42   77    ?AST 15 - 41 U/L  18   23    ?ALT 0 - 44 U/L  19   16    ? ? ? ? ?Imaging: ? ?Within last 24 hrs: No results found. ? ?Assessment ?   ?Right inguinal hernia, readily reducible. ?Patient Active Problem List  ? Diagnosis Date Noted  ? Right inguinal pain 12/27/2021  ? Right inguinal hernia 12/27/2021  ? Chronic pain of both lower extremities 12/27/2021  ? SBO (small bowel obstruction) ([place]) 06/14/2021  ? Special screening for malignant neoplasms, colon   ? Benign neoplasm of descending colon   ? Diverticulosis of large intestine without diverticulitis   ? Anal fissure s/p partial internal anal sphincterotomy 11/11/2018   ? Melanosis of colon   ? Internal hemorrhoids   ? Lumbar degenerative disc disease 04/06/2018  ? Piriformis muscle pain 04/06/2018  ? Chronic pain syndrome 10/22/2017  ? Chronic midline low back pain without sciatica 10/22/2017  ? Lumbar facet arthropathy 10/22/2017  ? Hyperglycemia 05/30/2015  ? Abnormal blood sugar 05/10/2015  ? Arteriosclerosis of coronary artery 05/10/2015  ? 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  ? Acid reflux 05/10/2015  ? HLD (hyperlipidemia) 05/10/2015  ? BP (high blood pressure) 05/10/2015  ? Lumbar radiculopathy 05/10/2015  ? Avitaminosis D 05/10/2015

## 2022-01-10 NOTE — Progress Notes (Signed)
Patient ID: Shawn Crawford, male   DOB: Feb 07, 1961, 61 y.o.   MRN: [id number] ? ?Chief Complaint: Right inguinal hernia ? ?History of Present Illness ?Shawn Crawford is a 61 y.o. male with right inguinal hernia present for 2 months.  The size of the bulge remains about the same.  Rarely has any pain associated with it, occasionally a burning sensation.  Denies nausea, vomiting, fevers or chills.  Reports no voiding issues urinating normally.. ? ?Past Medical History ?Past Medical History:  ?Diagnosis Date  ? Actinic keratosis   ? Dysplastic nevus 09/06/2014  ? Epigastric at mid line. Moderate to severe atypia, lateral margin involved. Excised 11/07/2014, margins free.  ? Dysplastic nevus 07/09/2015  ? Left lat. epigastric/costal margin. Moderate aypia, limited margins free.   ? Hx of cardiac catheterization   ? Hyperlipidemia   ? Hypertension   ?  ? ? ?Past Surgical History:  ?Procedure Laterality Date  ? APPENDECTOMY    ? CARDIAC CATHETERIZATION    ? COLONOSCOPY WITH PROPOFOL N/A 06/24/2018  ? Procedure: COLONOSCOPY WITH PROPOFOL;  Surgeon: Virgel Manifold, MD;  Location: [facility];  Service: Endoscopy;  Laterality: N/A;  ? CORONARY ARTERY BYPASS GRAFT    ? x 3  ? ESOPHAGOGASTRODUODENOSCOPY (EGD) WITH PROPOFOL N/A 06/24/2018  ? Procedure: ESOPHAGOGASTRODUODENOSCOPY (EGD) WITH PROPOFOL;  Surgeon: Virgel Manifold, MD;  Location: [facility];  Service: Endoscopy;  Laterality: N/A;  ? EVALUATION UNDER ANESTHESIA WITH HEMORRHOIDECTOMY N/A 11/11/2018  ? Procedure: HEMORRHOIDAL LIGATION/PEXY, PARTIAL LATERAL SPINCTEROTOMY;  Surgeon: Michael Boston, MD;  Location: [facility];  Service: General;  Laterality: N/A;  ? HERNIA REPAIR    ? ? ?Allergies  ?Allergen Reactions  ? Penicillins Itching, Other (See Comments) and Rash  ?  Did it involve swelling of the face/tongue/throat, SOB, or low BP? No ?Did it involve sudden or severe rash/hives, skin peeling, or any reaction on the inside of your mouth or nose? No ?Did you need  to seek medical attention at a hospital or doctor's office? Yes ?When did it last happen? Childhood allergy ?If all above answers are ?NO?, may proceed with cephalosporin use. ? ?Can take keflex with no problems ?  ? ? ?Current Outpatient Medications  ?Medication Sig Dispense Refill  ? amLODipine (NORVASC) 5 MG tablet TAKE 1 TABLET BY MOUTH  DAILY 90 tablet 1  ? aspirin EC 81 MG tablet Take 81 mg by mouth daily.    ? Buprenorphine HCl (BELBUCA) 600 MCG FILM Place 1 Film inside cheek every 12 (twelve) hours as needed. For chronic pain syndrome 60 each 2  ? desonide (DESOWEN) 0.05 % ointment Apply topically 3 (three) times a week. Apply topically on Mondays, Wednesdays, and Fridays. 60 g 3  ? ketoconazole (NIZORAL) 2 % cream Apply topically on Tuesdays, Thursdays, and Saturdays. 60 g 3  ? MULTIPLE VITAMINS PO Take 1 tablet by mouth daily.     ? rosuvastatin (CRESTOR) 40 MG tablet TAKE 1 TABLET BY MOUTH  DAILY 90 tablet 3  ? tadalafil (CIALIS) 5 MG tablet Take 1 tablet (5 mg total) by mouth daily. 90 tablet 3  ? testosterone cypionate (DEPOTESTOSTERONE CYPIONATE) 200 MG/ML injection Inject 1 mL (200 mg total) into the muscle every 14 (fourteen) days. Can try to go to 18 days 10 mL 5  ? traMADol (ULTRAM) 50 MG tablet Take 1 tablet (50 mg total) by mouth every 8 (eight) hours as needed for severe pain. Month last 30 days. 90 tablet 2  ? zolpidem (  AMBIEN CR) 12.5 MG CR tablet TAKE ONE TABLET BY MOUTH AT BEDTIME AS NEEDED FOR SLEEP 30 tablet 4  ? ?No current facility-administered medications for this visit.  ? ? ?Family History ?Family History  ?Problem Relation Age of Onset  ? Hyperlipidemia Mother   ? Diabetes Father   ? Hypertension Father   ? Thyroid cancer Sister   ? Colon cancer Sister   ?  ? ? ?Social History ?Social History  ? ?Tobacco Use  ? Smoking status: Never  ? Smokeless tobacco: Never  ?Vaping Use  ? Vaping Use: Never used  ?Substance Use Topics  ? Alcohol use: Yes  ?  Alcohol/week: 0.0 standard drinks  ?   Comment: glass of wine occasionally  ? Drug use: No  ?  ?  ? ? ?Review of Systems  ?Constitutional: Negative.   ?HENT: Negative.    ?Eyes: Negative.   ?Respiratory: Negative.    ?Cardiovascular: Negative.   ?Gastrointestinal: Negative.   ?Genitourinary: Negative.   ?Skin: Negative.   ?Neurological: Negative.   ?Psychiatric/Behavioral: Negative.    ?  ? ?Physical Exam ?Blood pressure 135/80, pulse 88, temperature 98.2 ?F (36.8 ?C), temperature source Oral, height '6\' 1"'$  (1.854 m), weight 157 lb 3.2 oz (71.3 kg), SpO2 99 %. ?Last Weight  Most recent update: 01/09/2022  9:53 AM  ? ? Weight  ?71.3 kg (157 lb 3.2 oz)  ?      ? ?  ? ? ?CONSTITUTIONAL: Well developed, and nourished, appropriately responsive and aware without distress.   ?EYES: Sclera non-icteric.   ?EARS, NOSE, MOUTH AND THROAT:  The oropharynx is clear. Oral mucosa is pink and moist.   Hearing is intact to voice.  ?NECK: Trachea is midline, and there is no jugular venous distension.  ?LYMPH NODES:  Lymph nodes in the neck are not enlarged. ?RESPIRATORY:  Lungs are clear, and breath sounds are equal bilaterally. Normal respiratory effort without pathologic use of accessory muscles. ?CARDIOVASCULAR: Heart is regular in rate and rhythm. ?GI: The abdomen is soft, nontender, and nondistended. There were no palpable masses. I did not appreciate hepatosplenomegaly. There were normal bowel sounds. ?GU: Right groin bulge, readily appreciable, increases with Valsalva.  Left groin without evidence of inguinal hernia.  Testes distended bilaterally. ?MUSCULOSKELETAL:  Symmetrical muscle tone appreciated in all four extremities.    ?SKIN: Skin turgor is normal. No pathologic skin lesions appreciated.  ?NEUROLOGIC:  Motor and sensation appear grossly normal.  Cranial nerves are grossly without defect. ?PSYCH:  Alert and oriented to person, place and time. Affect is appropriate for situation. ? ?Data Reviewed ?I have personally reviewed what is currently available of the  patient's imaging, recent labs and medical records.   ?Labs:  ? ?  Latest Ref Rng & Units 06/15/2021  ?  5:00 AM 06/14/2021  ? 12:18 PM 04/18/2020  ? 10:26 AM  ?CBC  ?WBC 4.0 - 10.5 K/uL 5.1   5.3   6.4    ?Hemoglobin 13.0 - 17.0 g/dL 12.0   15.6   14.4    ?Hematocrit 39.0 - 52.0 % 34.9   42.8   42.0    ?Platelets 150 - 400 K/uL 145   200   173    ? ? ?  Latest Ref Rng & Units 06/15/2021  ?  5:00 AM 06/14/2021  ? 12:18 PM 04/18/2020  ? 10:26 AM  ?CMP  ?Glucose 70 - 99 mg/dL 93   155   127    ?BUN 8 -  23 mg/dL '21   25   13    '$ ?Creatinine 0.61 - 1.24 mg/dL 1.20   1.11   0.99    ?Sodium 135 - 145 mmol/L 134   134   139    ?Potassium 3.5 - 5.1 mmol/L 3.6   2.8   5.0    ?Chloride 98 - 111 mmol/L 94   94   100    ?CO2 22 - 32 mmol/L 33   29   24    ?Calcium 8.9 - 10.3 mg/dL 8.1   7.7   9.6    ?Total Protein 6.5 - 8.1 g/dL  6.4   6.9    ?Total Bilirubin 0.3 - 1.2 mg/dL  1.1   0.2    ?Alkaline Phos 38 - 126 U/L  42   77    ?AST 15 - 41 U/L  18   23    ?ALT 0 - 44 U/L  19   16    ? ? ? ? ?Imaging: ? ?Within last 24 hrs: No results found. ? ?Assessment ?   ?Right inguinal hernia, readily reducible. ?Patient Active Problem List  ? Diagnosis Date Noted  ? Right inguinal pain 12/27/2021  ? Right inguinal hernia 12/27/2021  ? Chronic pain of both lower extremities 12/27/2021  ? SBO (small bowel obstruction) ([place]) 06/14/2021  ? Special screening for malignant neoplasms, colon   ? Benign neoplasm of descending colon   ? Diverticulosis of large intestine without diverticulitis   ? Anal fissure s/p partial internal anal sphincterotomy 11/11/2018   ? Melanosis of colon   ? Internal hemorrhoids   ? Lumbar degenerative disc disease 04/06/2018  ? Piriformis muscle pain 04/06/2018  ? Chronic pain syndrome 10/22/2017  ? Chronic midline low back pain without sciatica 10/22/2017  ? Lumbar facet arthropathy 10/22/2017  ? Hyperglycemia 05/30/2015  ? Abnormal blood sugar 05/10/2015  ? Arteriosclerosis of coronary artery 05/10/2015  ? 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  ? Acid reflux 05/10/2015  ? HLD (hyperlipidemia) 05/10/2015  ? BP (high blood pressure) 05/10/2015  ? Lumbar radiculopathy 05/10/2015  ? Avitaminosis D 05/10/2015

## 2022-01-10 NOTE — Telephone Encounter (Signed)
Incoming call from patient, he is now aware of all dates regarding his surgery and verbalized understanding. ?

## 2022-01-10 NOTE — Telephone Encounter (Signed)
Outgoing call is made, left message for patient to call, please inform him of the following regarding scheduled surgery:  ? ?Pre-Admission date/time, COVID Testing date and Surgery date. ? ?Surgery Date: 02/05/22 ?Preadmission Testing Date: 01/28/22 (phone 8a-1p) ?Covid Testing Date: Not needed.   ? ?Patient has been made aware to call [phone], between 1-3:00pm the day before surgery, to find out what time to arrive for surgery.   ? ?

## 2022-01-23 ENCOUNTER — Other Ambulatory Visit: Payer: 59

## 2022-01-23 ENCOUNTER — Other Ambulatory Visit: Payer: Self-pay | Admitting: Family Medicine

## 2022-01-23 DIAGNOSIS — N529 Male erectile dysfunction, unspecified: Secondary | ICD-10-CM

## 2022-01-27 ENCOUNTER — Ambulatory Visit: Payer: 59 | Admitting: Family Medicine

## 2022-01-27 VITALS — BP 119/76 | HR 86 | Temp 98.4°F | Wt 160.0 lb

## 2022-01-27 DIAGNOSIS — M5416 Radiculopathy, lumbar region: Secondary | ICD-10-CM

## 2022-01-27 DIAGNOSIS — G729 Myopathy, unspecified: Secondary | ICD-10-CM

## 2022-01-27 DIAGNOSIS — M791 Myalgia, unspecified site: Secondary | ICD-10-CM | POA: Diagnosis not present

## 2022-01-27 DIAGNOSIS — I10 Essential (primary) hypertension: Secondary | ICD-10-CM

## 2022-01-27 DIAGNOSIS — M79604 Pain in right leg: Secondary | ICD-10-CM | POA: Diagnosis not present

## 2022-01-27 DIAGNOSIS — M79605 Pain in left leg: Secondary | ICD-10-CM

## 2022-01-27 DIAGNOSIS — R7989 Other specified abnormal findings of blood chemistry: Secondary | ICD-10-CM

## 2022-01-27 NOTE — Progress Notes (Signed)
? ?I,Shawn Crawford,acting as a scribe for Shawn Durie, MD.,have documented all relevant documentation on the behalf of Shawn Durie, MD,as directed by  Shawn Durie, MD while in the presence of Shawn Durie, MD. ?  ? ? ?Established patient visit ? ? ?Patient: Shawn Crawford   DOB: 1961-08-23   61 y.o. Male  MRN: [id number] ?Visit Date: 01/27/2022 ? ?Today's healthcare provider: [name], MD  ? ?No chief complaint on file. ? ?Subjective  ?  ?HPI  ?Patient comes in today for follow-up.  He continues to have leg pain that is chronic but he is worried there is something going on the other than just his back.  He states his large muscles ache in his buttocks and legs and are sore to the touch.  No shoulder or upper extremity problems. ?Patient now married and is enjoying his newlywed life.  His 3 sons are all doing well. ?Hypertension, follow-up ? ?BP Readings from Last 3 Encounters:  ?01/27/22 119/76  ?01/09/22 135/80  ?12/27/21 130/83  ? Wt Readings from Last 3 Encounters:  ?01/27/22 160 lb (72.6 kg)  ?01/09/22 157 lb 3.2 oz (71.3 kg)  ?12/27/21 158 lb (71.7 kg)  ?  ? ?He was last seen for hypertension 6 months ago.  ?BP at that visit was 120/77. Management since that visit includes none. ? ?Symptoms: ?No chest pain No chest pressure  ?No palpitations No syncope  ?No dyspnea No orthopnea  ?No paroxysmal nocturnal dyspnea No lower extremity edema  ? ?Pertinent labs ?Lab Results  ?Component Value Date  ? CHOL 154 08/16/2021  ? HDL 51 08/16/2021  ? [place] 80 08/16/2021  ? TRIG 133 08/16/2021  ? CHOLHDL 3.1 04/18/2020  ? Lab Results  ?Component Value Date  ? NA 134 (L) 06/15/2021  ? K 3.6 06/15/2021  ? CREATININE 1.20 06/15/2021  ? GFRNONAA >60 06/15/2021  ? GLUCOSE 93 06/15/2021  ? TSH 1.540 08/16/2021  ?  ? ?The 10-year ASCVD risk score (Arnett DK, et al., 2019) is:  14.1% ? ?--------------------------------------------------------------------------------------------------- ? ? ?Medications: ?Outpatient Medications Prior to Visit  ?Medication Sig  ? amLODipine (NORVASC) 5 MG tablet TAKE 1 TABLET BY MOUTH  DAILY  ? aspirin EC 81 MG tablet Take 81 mg by mouth daily.  ? Buprenorphine HCl (BELBUCA) 600 MCG FILM Place 1 Film inside cheek every 12 (twelve) hours as needed. For chronic pain syndrome  ? desonide (DESOWEN) 0.05 % ointment Apply topically 3 (three) times a week. Apply topically on Mondays, Wednesdays, and Fridays. (Patient taking differently: Apply 1 application. topically daily as needed (dermatitis).)  ? fexofenadine (ALLEGRA) 180 MG tablet Take 180 mg by mouth daily as needed for allergies or rhinitis.  ? MULTIPLE VITAMINS PO Take 1 tablet by mouth daily.   ? rosuvastatin (CRESTOR) 40 MG tablet TAKE 1 TABLET BY MOUTH  DAILY  ? tadalafil (CIALIS) 5 MG tablet TAKE 1 TABLET BY MOUTH DAILY  ? testosterone cypionate (DEPOTESTOSTERONE CYPIONATE) 200 MG/ML injection Inject 1 mL (200 mg total) into the muscle every 14 (fourteen) days. Can try to go to 18 days  ? traMADol (ULTRAM) 50 MG tablet Take 1 tablet (50 mg total) by mouth every 8 (eight) hours as needed for severe pain. Month last 30 days.  ? vitamin B-12 (CYANOCOBALAMIN) 1000 MCG tablet Take 1,000 mcg by mouth daily.  ? zolpidem (AMBIEN CR) 12.5 MG CR tablet TAKE ONE TABLET BY MOUTH AT BEDTIME AS NEEDED FOR SLEEP  ? ?No  facility-administered medications prior to visit.  ? ? ?Review of Systems ? ?  ?  Objective  ?  ?BP 119/76 (BP Location: Left Arm, Patient Position: Sitting, Cuff Size: Normal)   Pulse 86   Temp 98.4 ?F (36.9 ?C) (Oral)   Wt 160 lb (72.6 kg)   SpO2 100%   BMI 21.11 kg/m?  ?  ? ?Physical Exam ?Vitals reviewed.  ?Constitutional:   ?   Appearance: Normal appearance.  ?HENT:  ?   Head: Normocephalic and atraumatic.  ?   Right Ear: External ear normal.  ?   Left Ear: External ear normal.  ?Eyes:  ?    General: No scleral icterus. ?Cardiovascular:  ?   Rate and Rhythm: Normal rate and regular rhythm.  ?   Pulses: Normal pulses.  ?   Heart sounds: Normal heart sounds.  ?Pulmonary:  ?   Breath sounds: Normal breath sounds.  ?Abdominal:  ?   Palpations: Abdomen is soft.  ?Musculoskeletal:     ?   General: No swelling or tenderness.  ?   Comments: No muscle wasting or atrophy or tenderness noted.  ?Skin: ?   General: Skin is warm and dry.  ?Neurological:  ?   General: No focal deficit present.  ?   Mental Status: He is alert and oriented to person, place, and time.  ?   Comments: Rate is normal and DTRs are symmetric  ?Psychiatric:     ?   Mood and Affect: Mood normal.     ?   Behavior: Behavior normal.     ?   Thought Content: Thought content normal.     ?   Judgment: Judgment normal.  ?  ? ? ?No results found for any visits on 01/27/22. ? Assessment & Plan  ?  ? ?1. Pain in both lower extremities ?He has known lumbosacral disease but will check labs for signs of other potential issues ?Try gabapentin at night. ?- HLA-B27 antigen ?- Sed Rate (ESR) ?- CK (Creatine Kinase) ?- ANA Direct w/Reflex if Positive ?- Magnesium ?- COMPLETE METABOLIC PANEL WITH GFR ? ?2. Myalgia ?Consider going to every other day statin or pain for short while. ?- HLA-B27 antigen ?- Sed Rate (ESR) ?- CK (Creatine Kinase) ?- ANA Direct w/Reflex if Positive ?- Magnesium ?- COMPLETE METABOLIC PANEL WITH GFR ? ?3. Myopathy ? ?- HLA-B27 antigen ?- Sed Rate (ESR) ?- CK (Creatine Kinase) ?- ANA Direct w/Reflex if Positive ?- Magnesium ?- COMPLETE METABOLIC PANEL WITH GFR ? ?4. Benign essential HTN ?Good control ?- HLA-B27 antigen ?- Sed Rate (ESR) ?- CK (Creatine Kinase) ?- ANA Direct w/Reflex if Positive ?- Magnesium ?- COMPLETE METABOLIC PANEL WITH GFR ? ?5. Lumbar radiculopathy ? ? ?6. Low testosterone ? ? ? ?No follow-ups on file.  ?   ? ?I, Shawn Durie, MD, have reviewed all documentation for this visit. The documentation on 01/29/22 for  the exam, diagnosis, procedures, and orders are all accurate and complete. ? ? ? ?Shawn Meloche Cranford Mon, MD  ?[facility] ?[phone] (phone) ?[phone] (fax) ? ?[facility] ?

## 2022-01-28 ENCOUNTER — Other Ambulatory Visit: Payer: Self-pay | Admitting: Family Medicine

## 2022-01-28 ENCOUNTER — Encounter
Admission: RE | Admit: 2022-01-28 | Discharge: 2022-01-28 | Disposition: A | Discharge status: Home / Self Care | Payer: 59 | Source: Ambulatory Visit | Attending: Surgery | Admitting: Surgery

## 2022-01-28 ENCOUNTER — Other Ambulatory Visit: Payer: Self-pay

## 2022-01-28 VITALS — Ht 71.0 in | Wt 158.0 lb

## 2022-01-28 DIAGNOSIS — Z01818 Encounter for other preprocedural examination: Secondary | ICD-10-CM

## 2022-01-28 DIAGNOSIS — I2510 Atherosclerotic heart disease of native coronary artery without angina pectoris: Secondary | ICD-10-CM

## 2022-01-28 DIAGNOSIS — R739 Hyperglycemia, unspecified: Secondary | ICD-10-CM

## 2022-01-28 DIAGNOSIS — R7309 Other abnormal glucose: Secondary | ICD-10-CM

## 2022-01-28 DIAGNOSIS — I10 Essential (primary) hypertension: Secondary | ICD-10-CM

## 2022-01-28 HISTORY — DX: Atherosclerotic heart disease of native coronary artery without angina pectoris: I25.10

## 2022-01-28 NOTE — Progress Notes (Signed)
?  Perioperative Services ?Pre-Admission/Anesthesia Testing ? ?  ? ?Date: 01/28/22 ? ?Name: Shawn Crawford ?MRN:   [id number] ? ?Re: Change in ABX for upcoming surgery ? ? Case: [id number] Date/Time: 02/05/22 0715  ? Procedure: XI ROBOTIC ASSISTED INGUINAL HERNIA (Right)  ? Anesthesia type: General  ? Pre-op diagnosis: right inguinal hernia  ? Location: [facility] OR ROOM 06 / [facility]  ? Surgeons: [name], MD  ? ?Primary attending surgeon was consulted regarding consideration of therapeutic change in antimicrobial agent being used for preoperative prophylaxis in this patient's upcoming surgical case. Following analysis of the risk versus benefits, the patient's primary attending surgeon advised that it would be acceptable to discontinue the ordered ciprofloxacin and place an order for cefazolin 2 gm IV on call to the OR. Orders for this patient were amended by me following collaborative conversation with attending surgeon taking into consideration of risk versus benefits associated with the change in therapy. ? ?Honor Loh, MSN, APRN, FNP-C, CEN ?[facility] [facility]  ?Peri-operative Services Nurse Practitioner ?Phone: [phone] ?01/28/22 4:25 PM ? ?

## 2022-01-28 NOTE — Progress Notes (Signed)
?Perioperative Services ?Pre-Admission/Anesthesia Testing ?  ?Date: 01/28/22 ?Name: Shawn Crawford ?MRN:   [id number] ? ?Re: Consideration of preoperative prophylactic antibiotic change  ? ?Request sent to: [name], MD (routed and/or faxed via [facility]) ? ?Planned Surgical Procedure(s):  ? ? Case: [id number] Date/Time: 02/05/22 0715  ? Procedure: XI ROBOTIC ASSISTED INGUINAL HERNIA (Right)  ? Anesthesia type: General  ? Pre-op diagnosis: right inguinal hernia  ? Location: [facility] OR ROOM 06 / [facility]  ? Surgeons: [name], MD  ? ?Clinical Notes:  ?Patient has a documented allergy/intolerance to PCN  ?Advising that PCN has caused him to experience pruritic rash in the past.  ? ?Screened as appropriate for cephalosporin use during medication reconciliation ?No immediate angioedema, dysphagia, SOB, anaphylaxis symptoms. ?No severe rash involving mucous membranes or skin necrosis. ?No hospital admissions related to side effects of PCN/cephalosporin use.  ?No documented reaction to PCN or cephalosporin in the last 10 years. ? ?NOTE: Patient advising that he has taken a first generation cephalosporin (cephalexin) in the past with no documented ADRs. ? ?Request:  ?As an evidence based approach to reducing the rate of incidence for post-operative SSI and the development of MDROs, could an agent that allows for narrower antimicrobial coverage for preoperative prophylaxis in this patient's upcoming surgical course be considered?  ? ?Currently ordered preoperative prophylactic ABX: ciprofloxacin.  ? ?Specifically requesting change to cephalosporin (CEFAZOLIN).  ?Drug of choice for many procedures; it is the most widely studied antimicrobial agent with proven efficacy for antimicrobial prophylaxis.  ? ?Desirable duration of action, spectrum of activity against organisms commonly encountered in surgery, and it has an excellent safety profile and low cost.  ? ?Active against streptococci,  methicillin-susceptible staphylococci, and many gram-negative organisms. ? ?Please communicate decision with me and I will change the orders in Epic as per your direction.  ? ?Things to consider: ?Many patients report that they were "allergic" to PCN earlier in life, however this does not translate into a true lifelong allergy. Patients can lose sensitivity to specific IgE antibodies over time if PCN is avoided (Kleris & Lugar, 2019).  ?Up to 10% of the adult population and 15% of hospitalized patients report an allergy to PCN, however clinical studies suggest that 90% of those reporting an allergy can tolerate PCN antibiotics (Kleris & Lugar, 2019).  ?Cross-sensitivity between PCN and cephalosporins has been documented as being as high as 10%, however this estimation included data believed to have been collected in a setting where there was contamination. Newer data suggests that the prevalence of cross-sensitivity between PCN and cephalosporins is actually estimated to be closer to 1% (Hermanides et al., 2018).   ?Patients labeled as PCN allergic, whether they are truly allergic or not, have been found to have inferior outcomes in terms of rates of serious infection, and these patients tend to have longer hospital stays ([place], 2019).  ?Treatment related secondary infections, such as Clostridioides difficile, have been linked to the improper use of broad spectrum antibiotics in patients improperly labeled as PCN allergic (Kleris & Lugar, 2019).  ?Anaphylaxis from cephalosporins is rare and the evidence suggests that there is no increased risk of an anaphylactic type reaction when cephalosporins are used in a PCN allergic patient (Pichichero, 2006). ? ?Citations: ?Hermanides J, Lemkes BA, Prins Pearla Dubonnet MW, Terreehorst I. Presumed ?-Lactam Allergy and Cross-reactivity in the Operating Theater: A Practical Approach. Anesthesiology. 2018 Aug;129(2):335-342. doi: 10.1097/ALN.0000000000002252. PMID:  18299371. ? ?Kleris, R. S., & Lugar, P.  L. (2019). Things We Do For No Reason: Failing to Question a Penicillin Allergy History. Journal of hospital medicine, 14(10), [phone]. Advance online publication. https://www.wallace-middleton.info/ ? ?Pichichero, M. E. (2006). Cephalosporins can be prescribed safely for penicillin-allergic patients. Journal of family medicine, 55(2), 106-112. Accessed: https://cdn.mdedge.com/files/s16f-public/Document/September-2017/5502JFP_AppliedEvidence1.pdf  ? ?BHonor Loh MSN, APRN, FNP-C, CEN ?[place] ?Peri-operative Services Nurse Practitioner ?FAX: [phone]?01/28/22 2:26 PM ?

## 2022-01-28 NOTE — Patient Instructions (Addendum)
Your procedure is scheduled on: Wednesday 02/05/22 ?Report to the Registration Desk on the 1st floor of the [facility]. ?To find out your arrival time, please call [phone] between 1PM - 3PM on: Tuesday 02/04/22 ?If your arrival time is 6:00 am, do not arrive prior to that time as the [facility] entrance doors do not open until 6:00 am. ? ?REMEMBER: ?Instructions that are not followed completely may result in serious medical risk, up to and including death; or upon the discretion of your surgeon and anesthesiologist your surgery may need to be rescheduled. ? ?Do not eat or drink after midnight the night before surgery.  ?No gum chewing, lozengers or hard candies. ? ?TAKE THESE MEDICATIONS THE MORNING OF SURGERY WITH A SIP OF WATER: ?amLODipine (NORVASC) 5 MG tablet ? ?Per Dr. Miguel Aschoff: Hold your 81 mg Aspirin for 5 days. Last dose on Thursday 5/4. ? ?One week prior to surgery: ?Stop Anti-inflammatories (NSAIDS) such as Advil, Aleve, Ibuprofen, Motrin, Naproxen, Naprosyn and Aspirin based products such as Excedrin, Goodys Powder, BC Powder. ? ?Stop taking your MULTIPLE VITAMINS PO, vitamin B-12 (CYANOCOBALAMIN) 1000 MCG tablet, and ANY OVER THE COUNTER supplements until after surgery. ? ?You may however, continue to take Tylenol if needed for pain up until the day of surgery. ? ?No Alcohol for 24 hours before or after surgery. ? ?No Smoking including e-cigarettes for 24 hours prior to surgery.  ?No chewable tobacco products for at least 6 hours prior to surgery.  ?No nicotine patches on the day of surgery. ? ?Do not use any "recreational" drugs for at least a week prior to your surgery.  ?Please be advised that the combination of cocaine and anesthesia may have negative outcomes, up to and including death. ?If you test positive for cocaine, your surgery will be cancelled. ? ?On the morning of surgery brush your teeth with toothpaste and water, you may rinse your mouth with mouthwash if you wish. ?Do not  swallow any toothpaste or mouthwash. ? ?Use CHG wipes as directed on instruction sheet. ? ?Do not wear jewelry. ? ?Do not wear lotions, powders, or colognes.  ? ?Do not shave body from the neck down 48 hours prior to surgery just in case you cut yourself which could leave a site for infection.  ?Also, freshly shaved skin may become irritated if using the CHG soap. ? ?Do not bring valuables to the hospital. [facility] is not responsible for any missing/lost belongings or valuables.  ? ?Notify your doctor if there is any change in your medical condition (cold, fever, infection). ? ?Wear comfortable clothing (specific to your surgery type) to the hospital. ? ?After surgery, you can help prevent lung complications by doing breathing exercises.  ?Take deep breaths and cough every 1-2 hours.  ? ?When coughing or sneezing, hold a pillow firmly against your incision with both hands. This is called ?splinting.? Doing this helps protect your incision. It also decreases belly discomfort. ? ?If you are being discharged the day of surgery, you will not be allowed to drive home. ?You will need a responsible adult (18 years or older) to drive you home and stay with you that night.  ? ?If you are taking public transportation, you will need to have a responsible adult (18 years or older) with you. ?Please confirm with your physician that it is acceptable to use public transportation.  ? ?Please call the [facility]. at [phone] if you have any questions about these instructions. ? ?  Surgery Visitation Policy: ? ?Patients undergoing a surgery or procedure may have two family members or support persons with them as long as the person is not COVID-19 positive or experiencing its symptoms.  ? ?Inpatient Visitation:   ? ?Visiting hours are 7 a.m. to 8 p.m. ?Up to four visitors are allowed at one time in a patient room, including children. The visitors may rotate out with other people during the day. One designated  support person (adult) may remain overnight.  ?

## 2022-01-29 ENCOUNTER — Encounter: Payer: Self-pay | Admitting: Urgent Care

## 2022-01-29 ENCOUNTER — Other Ambulatory Visit
Admission: RE | Admit: 2022-01-29 | Discharge: 2022-01-29 | Disposition: A | Discharge status: Home / Self Care | Payer: 59 | Source: Ambulatory Visit | Attending: Surgery | Admitting: Surgery

## 2022-01-29 DIAGNOSIS — I2510 Atherosclerotic heart disease of native coronary artery without angina pectoris: Secondary | ICD-10-CM | POA: Diagnosis not present

## 2022-01-29 DIAGNOSIS — I10 Essential (primary) hypertension: Secondary | ICD-10-CM | POA: Diagnosis not present

## 2022-01-29 DIAGNOSIS — R7309 Other abnormal glucose: Secondary | ICD-10-CM

## 2022-01-29 DIAGNOSIS — R739 Hyperglycemia, unspecified: Secondary | ICD-10-CM | POA: Insufficient documentation

## 2022-01-29 DIAGNOSIS — Z01818 Encounter for other preprocedural examination: Secondary | ICD-10-CM | POA: Insufficient documentation

## 2022-01-29 LAB — CMP14+EGFR
ALT: 19 IU/L (ref 0–44)
AST: 23 IU/L (ref 0–40)
Albumin/Globulin Ratio: 2 (ref 1.2–2.2)
Albumin: 4.5 g/dL (ref 3.8–4.8)
Alkaline Phosphatase: 84 IU/L (ref 44–121)
BUN/Creatinine Ratio: 13 (ref 10–24)
BUN: 14 mg/dL (ref 8–27)
Bilirubin Total: 0.3 mg/dL (ref 0.0–1.2)
CO2: 27 mmol/L (ref 20–29)
Calcium: 9.7 mg/dL (ref 8.6–10.2)
Chloride: 100 mmol/L (ref 96–106)
Creatinine, Ser: 1.12 mg/dL (ref 0.76–1.27)
Globulin, Total: 2.2 g/dL (ref 1.5–4.5)
Glucose: 137 mg/dL — ABNORMAL HIGH (ref 70–99)
Potassium: 5.1 mmol/L (ref 3.5–5.2)
Sodium: 141 mmol/L (ref 134–144)
Total Protein: 6.7 g/dL (ref 6.0–8.5)
eGFR: 75 mL/min/{1.73_m2} (ref >59)

## 2022-01-29 LAB — BASIC METABOLIC PANEL
Anion gap: 7 (ref 5–15)
BUN: 14 mg/dL (ref 8–23)
CO2: 29 mmol/L (ref 22–32)
Calcium: 9.1 mg/dL (ref 8.9–10.3)
Chloride: 104 mmol/L (ref 98–111)
Creatinine, Ser: 1.07 mg/dL (ref 0.61–1.24)
GFR, Estimated: >60 mL/min (ref >60)
Glucose, Bld: 135 mg/dL — ABNORMAL HIGH (ref 70–99)
Potassium: 3.7 mmol/L (ref 3.5–5.1)
Sodium: 140 mmol/L (ref 135–145)

## 2022-01-29 LAB — CBC
HCT: 45.3 % (ref 39.0–52.0)
Hemoglobin: 15.3 g/dL (ref 13.0–17.0)
MCH: 29.8 pg (ref 26.0–34.0)
MCHC: 33.8 g/dL (ref 30.0–36.0)
MCV: 88.3 fL (ref 80.0–100.0)
Platelets: 179 10*3/uL (ref 150–400)
RBC: 5.13 MIL/uL (ref 4.22–5.81)
RDW: 11.9 % (ref 11.5–15.5)
WBC: 5.4 10*3/uL (ref 4.0–10.5)
nRBC: 0 % (ref 0.0–0.2)

## 2022-01-31 ENCOUNTER — Telehealth (INDEPENDENT_AMBULATORY_CARE_PROVIDER_SITE_OTHER): Payer: Self-pay

## 2022-01-31 NOTE — Telephone Encounter (Signed)
VM for pt advising that we had to move his appt to 5.8.23 @ 9:45 instead of his scheduled appt time of 8:30 due to Dr. Delana Meyer being in surgery. If pt calls back, please let him know and see if that time works for him. If not, we will have to R/S (may have the 2:45 slot still open?). ?

## 2022-02-02 NOTE — Progress Notes (Signed)
? ? ? ? ?MRN : [id number] ? ?Shawn Crawford is a 61 y.o. (1961-01-16) male who presents with chief complaint of check circulation. ? ?History of Present Illness:  ? ?The patient is seen for evaluation of painful lower extremities. Patient notes the pain is variable and not always associated with activity.  The pain is somewhat consistent day to day occurring on most days. The patient notes the pain also occurs with standing and routinely seems worse as the day wears on. The pain has been progressive over the past several years. The patient states these symptoms are causing  a negative impact on quality of life and daily activities which was a factor in the referral. ? ?The patient has a  history of back problems and DJD of the lumbar and sacral spine.  Last treated in 2015. ? ?The patient denies rest pain or dangling of an extremity off the side of the bed during the night for relief. ?No open wounds or sores at this time. ?No history of DVT or phlebitis. ?No prior vascular interventions or surgeries. ?  ? ?No outpatient medications have been marked as taking for the 02/03/22 encounter (Appointment) with Delana Meyer, Dolores Lory, MD.  ? ? ?Past Medical History:  ?Diagnosis Date  ? Actinic keratosis   ? Coronary artery disease   ? Dysplastic nevus 09/06/2014  ? Epigastric at mid line. Moderate to severe atypia, lateral margin involved. Excised 11/07/2014, margins free.  ? Dysplastic nevus 07/09/2015  ? Left lat. epigastric/costal margin. Moderate aypia, limited margins free.   ? Hx of cardiac catheterization   ? Hyperlipidemia   ? Hypertension   ? ? ?Past Surgical History:  ?Procedure Laterality Date  ? APPENDECTOMY    ? CARDIAC CATHETERIZATION    ? COLONOSCOPY WITH PROPOFOL N/A 06/24/2018  ? Procedure: COLONOSCOPY WITH PROPOFOL;  Surgeon: Virgel Manifold, MD;  Location: [facility] ENDOSCOPY;  Service: Endoscopy;  Laterality: N/A;  ? CORONARY ARTERY BYPASS GRAFT    ? x 3  ? ESOPHAGOGASTRODUODENOSCOPY (EGD) WITH PROPOFOL N/A  06/24/2018  ? Procedure: ESOPHAGOGASTRODUODENOSCOPY (EGD) WITH PROPOFOL;  Surgeon: Virgel Manifold, MD;  Location: [facility] ENDOSCOPY;  Service: Endoscopy;  Laterality: N/A;  ? EVALUATION UNDER ANESTHESIA WITH HEMORRHOIDECTOMY N/A 11/11/2018  ? Procedure: HEMORRHOIDAL LIGATION/PEXY, PARTIAL LATERAL SPINCTEROTOMY;  Surgeon: Michael Boston, MD;  Location: [facility] ORS;  Service: General;  Laterality: N/A;  ? HERNIA REPAIR    ? ? ?Social History ?Social History  ? ?Tobacco Use  ? Smoking status: Never  ? Smokeless tobacco: Never  ?Vaping Use  ? Vaping Use: Never used  ?Substance Use Topics  ? Alcohol use: Yes  ?  Alcohol/week: 0.0 standard drinks  ?  Comment: glass of wine occasionally  ? Drug use: No  ? ? ?Family History ?Family History  ?Problem Relation Age of Onset  ? Hyperlipidemia Mother   ? Diabetes Father   ? Hypertension Father   ? Thyroid cancer Sister   ? Colon cancer Sister   ? ? ?Allergies  ?Allergen Reactions  ? Penicillins Itching, Rash and Other (See Comments)  ?  Has taken first generation cephalosporin (cephalexin) with no documented ADRs. ? ?Did it involve swelling of the face/tongue/throat, SOB, or low BP? No ?Did it involve sudden or severe rash/hives, skin peeling, or any reaction on the inside of your mouth or nose? No ?Did you need to seek medical attention at a hospital or doctor's office? Yes ?When did it last happen? Childhood allergy ?If all above answers are "  NO", may proceed with cephalosporin use.  ? ? ? ?REVIEW OF SYSTEMS (Negative unless checked) ? ?Constitutional: '[]'$ Weight loss  '[]'$ Fever  '[]'$ Chills ?Cardiac: '[]'$ Chest pain   '[]'$ Chest pressure   '[]'$ Palpitations   '[]'$ Shortness of breath when laying flat   '[]'$ Shortness of breath with exertion. ?Vascular:  '[x]'$ Pain in legs with walking   '[]'$ Pain in legs at rest  '[]'$ History of DVT   '[]'$ Phlebitis   '[]'$ Swelling in legs   '[]'$ Varicose veins   '[]'$ Non-healing ulcers ?Pulmonary:   '[]'$ Uses home oxygen   '[]'$ Productive cough   '[]'$ Hemoptysis   '[]'$ Wheeze  '[]'$ COPD    '[]'$ Asthma ?Neurologic:  '[]'$ Dizziness   '[]'$ Seizures   '[]'$ History of stroke   '[]'$ History of TIA  '[]'$ Aphasia   '[]'$ Vissual changes   '[]'$ Weakness or numbness in arm   '[]'$ Weakness or numbness in leg ?Musculoskeletal:   '[]'$ Joint swelling   '[x]'$ Joint pain   '[x]'$ Low back pain ?Hematologic:  '[]'$ Easy bruising  '[]'$ Easy bleeding   '[]'$ Hypercoagulable state   '[]'$ Anemic ?Gastrointestinal:  '[]'$ Diarrhea   '[]'$ Vomiting  '[]'$ Gastroesophageal reflux/heartburn   '[]'$ Difficulty swallowing. ?Genitourinary:  '[]'$ Chronic kidney disease   '[]'$ Difficult urination  '[]'$ Frequent urination   '[]'$ Blood in urine ?Skin:  '[]'$ Rashes   '[]'$ Ulcers  ?Psychological:  '[]'$ History of anxiety   '[]'$  History of major depression. ? ?Physical Examination ? ?There were no vitals filed for this visit. ?There is no height or weight on file to calculate BMI. ?Gen: WD/WN, NAD ?Head: [place]/AT, No temporalis wasting.  ?Ear/Nose/Throat: Hearing grossly intact, nares w/o erythema or drainage ?Eyes: PER, EOMI, sclera nonicteric.  ?Neck: Supple, no masses.  No bruit or JVD.  ?Pulmonary:  Good air movement, no audible wheezing, no use of accessory muscles.  ?Cardiac: RRR, normal S1, S2, no Murmurs. ?Vascular:  mild trophic changes, no open wounds ?Vessel Right Left  ?Radial Palpable Palpable  ?PT Palpable Palpable  ?DP Palpable Palpable  ?Gastrointestinal: soft, non-distended. No guarding/no peritoneal signs.  ?Musculoskeletal: M/S 5/5 throughout.  No visible deformity.  ?Neurologic: CN 2-12 intact. Pain and light touch intact in extremities.  Symmetrical.  Speech is fluent. Motor exam as listed above. ?Psychiatric: Judgment intact, Mood & affect appropriate for pt's clinical situation. ?Dermatologic: No rashes or ulcers noted.  No changes consistent with cellulitis. ? ? ?CBC ?Lab Results  ?Component Value Date  ? WBC 5.4 01/29/2022  ? HGB 15.3 01/29/2022  ? HCT 45.3 01/29/2022  ? MCV 88.3 01/29/2022  ? PLT 179 01/29/2022  ? ? ?BMET ?   ?Component Value Date/Time  ? NA 140 01/29/2022 1000  ? NA 141 01/28/2022  0950  ? K 3.7 01/29/2022 1000  ? CL 104 01/29/2022 1000  ? CO2 29 01/29/2022 1000  ? GLUCOSE 135 (H) 01/29/2022 1000  ? BUN 14 01/29/2022 1000  ? BUN 14 01/28/2022 0950  ? CREATININE 1.07 01/29/2022 1000  ? CALCIUM 9.1 01/29/2022 1000  ? GFRNONAA >60 01/29/2022 1000  ? GFRAA 96 04/18/2020 1026  ? ?Estimated Creatinine Clearance: 73.5 mL/min (by C-G formula based on SCr of 1.07 mg/dL). ? ?COAG ?No results found for: INR, PROTIME ? ?Radiology ?No results found. ? ? ?Assessment/Plan ?1. Pain in both lower extremities ?Recommend: ? ?The patient has atypical pain symptoms for vascular disease and on exam I do not find evidence of vascular pathology that would explain the patient's symptoms.  Noninvasive studies do not identify significant vascular problems ? ?I suspect the patient is c/o pseudoclaudication.  Patient should have an evaluation of the LS spine which I defer to the primary service  or the Spine service. ? ?The patient should continue walking and begin a more formal exercise program. ?The patient should continue his antiplatelet therapy and aggressive treatment of the lipid abnormalities. ? ?Patient will follow-up with me 3 months.  ? ?2. Pure hypercholesterolemia ?Continue statin as ordered and reviewed, no changes at this time  ? ?3. Lumbar degenerative disc disease ?Recommend: ? ?The patient has atypical pain symptoms for vascular disease and on exam I do not find evidence of vascular pathology that would explain the patient's symptoms.  Noninvasive studies do not identify significant vascular problems ? ?I suspect the patient is c/o pseudoclaudication.  Patient should have an evaluation of the LS spine which I defer to the primary service or the Spine service. ? ?The patient should continue walking and begin a more formal exercise program. ?The patient should continue his antiplatelet therapy and aggressive treatment of the lipid abnormalities. ? ?Patient will follow-up with me 3 months.  ? ?4. Benign  essential HTN ?Continue antihypertensive medications as already ordered, these medications have been reviewed and there are no changes at this time.  ? ?5. Arteriosclerosis of coronary artery ?Continue cardiac and antihypertens

## 2022-02-03 ENCOUNTER — Encounter (INDEPENDENT_AMBULATORY_CARE_PROVIDER_SITE_OTHER): Payer: Self-pay | Admitting: Vascular Surgery

## 2022-02-03 ENCOUNTER — Ambulatory Visit (INDEPENDENT_AMBULATORY_CARE_PROVIDER_SITE_OTHER): Payer: 59 | Admitting: Vascular Surgery

## 2022-02-03 VITALS — BP 126/76 | HR 74 | Resp 16 | Ht 71.0 in | Wt 158.8 lb

## 2022-02-03 DIAGNOSIS — M5136 Other intervertebral disc degeneration, lumbar region: Secondary | ICD-10-CM

## 2022-02-03 DIAGNOSIS — M79604 Pain in right leg: Secondary | ICD-10-CM

## 2022-02-03 DIAGNOSIS — M79605 Pain in left leg: Secondary | ICD-10-CM

## 2022-02-03 DIAGNOSIS — I2510 Atherosclerotic heart disease of native coronary artery without angina pectoris: Secondary | ICD-10-CM

## 2022-02-03 DIAGNOSIS — I10 Essential (primary) hypertension: Secondary | ICD-10-CM | POA: Diagnosis not present

## 2022-02-03 DIAGNOSIS — E7800 Pure hypercholesterolemia, unspecified: Secondary | ICD-10-CM

## 2022-02-04 MED ORDER — GABAPENTIN 300 MG PO CAPS: 300.0000 mg | ORAL_CAPSULE | ORAL | Status: AC

## 2022-02-04 MED ORDER — CHLORHEXIDINE GLUCONATE CLOTH 2 % EX PADS: 6.0000 | MEDICATED_PAD | Freq: Once | CUTANEOUS | Status: DC

## 2022-02-04 MED ORDER — LACTATED RINGERS IV SOLN: INTRAVENOUS | Status: DC

## 2022-02-04 MED ORDER — ORAL CARE MOUTH RINSE: 15.0000 mL | Freq: Once | OROMUCOSAL | Status: AC

## 2022-02-04 MED ORDER — ACETAMINOPHEN 500 MG PO TABS: 1000.0000 mg | ORAL_TABLET | ORAL | Status: AC

## 2022-02-04 MED ORDER — FAMOTIDINE 20 MG PO TABS: 20.0000 mg | ORAL_TABLET | Freq: Once | ORAL | Status: AC

## 2022-02-04 MED ORDER — CELECOXIB 200 MG PO CAPS: 200.0000 mg | ORAL_CAPSULE | ORAL | Status: AC

## 2022-02-04 MED ORDER — CHLORHEXIDINE GLUCONATE 0.12 % MT SOLN: 15.0000 mL | Freq: Once | OROMUCOSAL | Status: AC

## 2022-02-04 MED ORDER — CEFAZOLIN SODIUM-DEXTROSE 2-4 GM/100ML-% IV SOLN
2.0000 g | Freq: Once | INTRAVENOUS | Status: AC
  Administered 2022-02-05: 2 g via INTRAVENOUS

## 2022-02-04 MED ORDER — BUPIVACAINE LIPOSOME 1.3 % IJ SUSP: 20.0000 mL | Freq: Once | INTRAMUSCULAR | Status: DC

## 2022-02-05 ENCOUNTER — Ambulatory Visit
Admission: RE | Admit: 2022-02-05 | Discharge: 2022-02-05 | Disposition: A | Discharge status: Home / Self Care | Payer: 59 | Attending: Surgery | Admitting: Surgery

## 2022-02-05 ENCOUNTER — Encounter: Payer: Self-pay | Admitting: Surgery

## 2022-02-05 ENCOUNTER — Ambulatory Visit: Payer: 59 | Admitting: Urgent Care

## 2022-02-05 ENCOUNTER — Other Ambulatory Visit: Payer: Self-pay

## 2022-02-05 ENCOUNTER — Encounter
Admission: RE | Disposition: A | Discharge status: Home / Self Care | Payer: Self-pay | Source: Home / Self Care | Attending: Surgery

## 2022-02-05 DIAGNOSIS — Z951 Presence of aortocoronary bypass graft: Secondary | ICD-10-CM | POA: Insufficient documentation

## 2022-02-05 DIAGNOSIS — I10 Essential (primary) hypertension: Secondary | ICD-10-CM | POA: Insufficient documentation

## 2022-02-05 DIAGNOSIS — K4090 Unilateral inguinal hernia, without obstruction or gangrene, not specified as recurrent: Secondary | ICD-10-CM | POA: Insufficient documentation

## 2022-02-05 DIAGNOSIS — I2510 Atherosclerotic heart disease of native coronary artery without angina pectoris: Secondary | ICD-10-CM | POA: Diagnosis not present

## 2022-02-05 HISTORY — PX: INSERTION OF MESH: SHX5868

## 2022-02-05 LAB — ANA W/REFLEX IF POSITIVE
Anti JO-1: <0.2 AI (ref 0.0–0.9)
Anti Nuclear Antibody (ANA): POSITIVE — AB
Centromere Ab Screen: <0.2 AI (ref 0.0–0.9)
Chromatin Ab SerPl-aCnc: <0.2 AI (ref 0.0–0.9)
ENA RNP Ab: 2.5 AI — ABNORMAL HIGH (ref 0.0–0.9)
ENA SM Ab Ser-aCnc: <0.2 AI (ref 0.0–0.9)
ENA SSA (RO) Ab: <0.2 AI (ref 0.0–0.9)
ENA SSB (LA) Ab: <0.2 AI (ref 0.0–0.9)
Scleroderma (Scl-70) (ENA) Antibody, IgG: <0.2 AI (ref 0.0–0.9)
dsDNA Ab: 2 IU/mL (ref 0–9)

## 2022-02-05 LAB — SEDIMENTATION RATE: Sed Rate: 2 mm/hr (ref 0–30)

## 2022-02-05 LAB — CK: Total CK: 133 U/L (ref 41–331)

## 2022-02-05 LAB — MAGNESIUM: Magnesium: 2 mg/dL (ref 1.6–2.3)

## 2022-02-05 LAB — HLA-B27 ANTIGEN: HLA B27: NEGATIVE

## 2022-02-05 SURGERY — HERNIORRHAPHY, INGUINAL, ROBOT-ASSISTED, LAPAROSCOPIC
Anesthesia: General | Laterality: Right

## 2022-02-05 MED ORDER — FENTANYL CITRATE (PF) 100 MCG/2ML IJ SOLN: 25.0000 ug | INTRAMUSCULAR | Status: DC | PRN

## 2022-02-05 MED ORDER — IBUPROFEN 800 MG PO TABS: 800.0000 mg | ORAL_TABLET | Freq: Three times a day (TID) | ORAL | 0 refills | Status: DC | PRN

## 2022-02-05 MED ORDER — PHENYLEPHRINE HCL (PRESSORS) 10 MG/ML IV SOLN
INTRAVENOUS | Status: DC | PRN
  Administered 2022-02-05 (×2): 160 ug via INTRAVENOUS

## 2022-02-05 MED ORDER — CHLORHEXIDINE GLUCONATE 0.12 % MT SOLN
OROMUCOSAL | Status: AC
  Administered 2022-02-05: 15 mL via OROMUCOSAL
  Filled 2022-02-05: qty 15

## 2022-02-05 MED ORDER — GABAPENTIN 300 MG PO CAPS
ORAL_CAPSULE | ORAL | Status: AC
  Administered 2022-02-05: 300 mg via ORAL
  Filled 2022-02-05: qty 1

## 2022-02-05 MED ORDER — SUGAMMADEX SODIUM 200 MG/2ML IV SOLN
INTRAVENOUS | Status: DC | PRN
  Administered 2022-02-05: 200 mg via INTRAVENOUS

## 2022-02-05 MED ORDER — BUPIVACAINE-EPINEPHRINE (PF) 0.25% -1:200000 IJ SOLN
INTRAMUSCULAR | Status: AC
Start: 2022-02-05 — End: ?
  Filled 2022-02-05: qty 30

## 2022-02-05 MED ORDER — ROCURONIUM BROMIDE 100 MG/10ML IV SOLN
INTRAVENOUS | Status: DC | PRN
  Administered 2022-02-05: 55 mg via INTRAVENOUS
  Administered 2022-02-05: 10 mg via INTRAVENOUS

## 2022-02-05 MED ORDER — ONDANSETRON HCL 4 MG/2ML IJ SOLN
INTRAMUSCULAR | Status: DC | PRN
  Administered 2022-02-05: 4 mg via INTRAVENOUS

## 2022-02-05 MED ORDER — DEXAMETHASONE SODIUM PHOSPHATE 10 MG/ML IJ SOLN
INTRAMUSCULAR | Status: DC | PRN
Start: 2022-02-05 — End: 2022-02-05
  Administered 2022-02-05: 8 mg via INTRAVENOUS

## 2022-02-05 MED ORDER — CELECOXIB 200 MG PO CAPS
ORAL_CAPSULE | ORAL | Status: AC
  Administered 2022-02-05: 200 mg via ORAL
  Filled 2022-02-05: qty 1

## 2022-02-05 MED ORDER — FENTANYL CITRATE (PF) 100 MCG/2ML IJ SOLN
INTRAMUSCULAR | Status: DC | PRN
  Administered 2022-02-05 (×2): 50 ug via INTRAVENOUS

## 2022-02-05 MED ORDER — PROPOFOL 10 MG/ML IV BOLUS
INTRAVENOUS | Status: DC | PRN
  Administered 2022-02-05: 150 mg via INTRAVENOUS

## 2022-02-05 MED ORDER — ACETAMINOPHEN 500 MG PO TABS
ORAL_TABLET | ORAL | Status: AC
  Administered 2022-02-05: 1000 mg via ORAL
  Filled 2022-02-05: qty 2

## 2022-02-05 MED ORDER — LACTATED RINGERS IV SOLN
INTRAVENOUS | Status: DC | PRN
Start: 2022-02-05 — End: 2022-02-05

## 2022-02-05 MED ORDER — LIDOCAINE HCL (CARDIAC) PF 100 MG/5ML IV SOSY
PREFILLED_SYRINGE | INTRAVENOUS | Status: DC | PRN
  Administered 2022-02-05: 60 mg via INTRAVENOUS

## 2022-02-05 MED ORDER — MIDAZOLAM HCL 2 MG/2ML IJ SOLN
INTRAMUSCULAR | Status: AC
Start: 2022-02-05 — End: ?
  Filled 2022-02-05: qty 2

## 2022-02-05 MED ORDER — OXYCODONE HCL 5 MG/5ML PO SOLN: 5.0000 mg | Freq: Once | ORAL | Status: DC | PRN

## 2022-02-05 MED ORDER — BUPIVACAINE-EPINEPHRINE (PF) 0.25% -1:200000 IJ SOLN
INTRAMUSCULAR | Status: DC | PRN
  Administered 2022-02-05: 30 mL

## 2022-02-05 MED ORDER — ACETAMINOPHEN 10 MG/ML IV SOLN: 1000.0000 mg | Freq: Once | INTRAVENOUS | Status: DC | PRN

## 2022-02-05 MED ORDER — CEFAZOLIN SODIUM-DEXTROSE 2-4 GM/100ML-% IV SOLN
INTRAVENOUS | Status: AC
  Filled 2022-02-05: qty 100

## 2022-02-05 MED ORDER — PROMETHAZINE HCL 25 MG/ML IJ SOLN: 6.2500 mg | INTRAMUSCULAR | Status: DC | PRN

## 2022-02-05 MED ORDER — PROPOFOL 500 MG/50ML IV EMUL
INTRAVENOUS | Status: AC
  Filled 2022-02-05: qty 50

## 2022-02-05 MED ORDER — DROPERIDOL 2.5 MG/ML IJ SOLN: 0.6250 mg | Freq: Once | INTRAMUSCULAR | Status: DC | PRN

## 2022-02-05 MED ORDER — FAMOTIDINE 20 MG PO TABS
ORAL_TABLET | ORAL | Status: AC
  Administered 2022-02-05: 20 mg via ORAL
  Filled 2022-02-05: qty 1

## 2022-02-05 MED ORDER — FENTANYL CITRATE (PF) 100 MCG/2ML IJ SOLN
INTRAMUSCULAR | Status: AC
  Filled 2022-02-05: qty 2

## 2022-02-05 MED ORDER — OXYCODONE HCL 5 MG PO TABS: 5.0000 mg | ORAL_TABLET | Freq: Once | ORAL | Status: DC | PRN

## 2022-02-05 MED ORDER — MIDAZOLAM HCL 2 MG/2ML IJ SOLN
INTRAMUSCULAR | Status: DC | PRN
  Administered 2022-02-05: 2 mg via INTRAVENOUS

## 2022-02-05 SURGICAL SUPPLY — 49 items
BLADE CLIPPER SURG (BLADE) ×3 IMPLANT
COVER TIP SHEARS 8 DVNC (MISCELLANEOUS) ×2 IMPLANT
COVER TIP SHEARS 8MM DA VINCI (MISCELLANEOUS) ×1
COVER WAND RF STERILE (DRAPES) ×3 IMPLANT
DERMABOND ADVANCED (GAUZE/BANDAGES/DRESSINGS) ×1
DERMABOND ADVANCED .7 DNX12 (GAUZE/BANDAGES/DRESSINGS) ×2 IMPLANT
DRAPE ARM DVNC X/XI (DISPOSABLE) ×6 IMPLANT
DRAPE COLUMN DVNC XI (DISPOSABLE) ×2 IMPLANT
DRAPE DA VINCI XI ARM (DISPOSABLE) ×3
DRAPE DA VINCI XI COLUMN (DISPOSABLE) ×1
ELECT REM PT RETURN 9FT ADLT (ELECTROSURGICAL) ×3
ELECTRODE REM PT RTRN 9FT ADLT (ELECTROSURGICAL) ×2 IMPLANT
GLOVE ORTHO TXT STRL SZ7.5 (GLOVE) ×10 IMPLANT
GOWN STRL REUS W/ TWL LRG LVL3 (GOWN DISPOSABLE) ×2 IMPLANT
GOWN STRL REUS W/ TWL XL LVL3 (GOWN DISPOSABLE) ×4 IMPLANT
GOWN STRL REUS W/TWL LRG LVL3 (GOWN DISPOSABLE) ×3
GOWN STRL REUS W/TWL XL LVL3 (GOWN DISPOSABLE)
GRASPER SUT TROCAR 14GX15 (MISCELLANEOUS) IMPLANT
IRRIGATION STRYKERFLOW (MISCELLANEOUS) IMPLANT
IRRIGATOR STRYKERFLOW (MISCELLANEOUS)
IV CATH ANGIO 14GX1.88 NO SAFE (IV SOLUTION) ×3 IMPLANT
IV NS 1000ML (IV SOLUTION)
IV NS 1000ML BAXH (IV SOLUTION) IMPLANT
KIT PINK PAD W/HEAD ARE REST (MISCELLANEOUS) ×3
KIT PINK PAD W/HEAD ARM REST (MISCELLANEOUS) ×2 IMPLANT
LABEL OR SOLS (LABEL) ×3 IMPLANT
MANIFOLD NEPTUNE II (INSTRUMENTS) ×3 IMPLANT
MESH 3DMAX LIGHT 4.1X6.2 RT LR (Mesh General) ×1 IMPLANT
NDL INSUFFLATION 14GA 120MM (NEEDLE) IMPLANT
NEEDLE HYPO 22GX1.5 SAFETY (NEEDLE) ×3 IMPLANT
NEEDLE INSUFFLATION 14GA 120MM (NEEDLE) ×3 IMPLANT
PACK LAP CHOLECYSTECTOMY (MISCELLANEOUS) ×3 IMPLANT
SEAL CANN UNIV 5-8 DVNC XI (MISCELLANEOUS) ×6 IMPLANT
SEAL XI 5MM-8MM UNIVERSAL (MISCELLANEOUS) ×3
SET TUBE SMOKE EVAC HIGH FLOW (TUBING) ×3 IMPLANT
SOLUTION ELECTROLUBE (MISCELLANEOUS) ×3 IMPLANT
SUT MNCRL 4-0 (SUTURE) ×1
SUT MNCRL 4-0 27XMFL (SUTURE) ×2
SUT STRATAFIX 0 PDS+ CT-2 23 (SUTURE)
SUT V-LOC 90 ABS 3-0 VLT  V-20 (SUTURE)
SUT V-LOC 90 ABS 3-0 VLT V-20 (SUTURE) IMPLANT
SUT VIC AB 0 CT2 27 (SUTURE) ×3 IMPLANT
SUT VIC AB 2-0 RB1 27 (SUTURE) ×3 IMPLANT
SUT VLOC 90 2/L VL 12 GS22 (SUTURE) IMPLANT
SUT VLOC 90 S/L VL9 GS22 (SUTURE) ×2 IMPLANT
SUTURE MNCRL 4-0 27XMF (SUTURE) ×2 IMPLANT
SUTURE STRATFX 0 PDS+ CT-2 23 (SUTURE) IMPLANT
TROCAR Z-THREAD FIOS 11X100 BL (TROCAR) IMPLANT
WATER STERILE IRR 500ML POUR (IV SOLUTION) ×3 IMPLANT

## 2022-02-05 NOTE — Interval H&P Note (Signed)
History and Physical Interval Note: ? ?02/05/2022 ?7:24 AM ? ?Shawn Crawford  has presented today for surgery, with the diagnosis of right inguinal hernia.  The various methods of treatment have been discussed with the patient and family. After consideration of risks, benefits and other options for treatment, the patient has consented to  Procedure(s): ?[name] (Right) as a surgical intervention.  The patient's history has been reviewed, patient examined, no change in status, stable for surgery.  I have reviewed the patient's chart and labs.  Questions were answered to the patient's satisfaction.   ?The right side is marked.  ? ?[name] ? ? ?

## 2022-02-05 NOTE — Anesthesia Procedure Notes (Signed)
Procedure Name: Intubation ?Date/Time: 02/05/2022 7:41 AM ?Performed by: Beverely Low, CRNA ?Pre-anesthesia Checklist: Patient identified, Patient being monitored, Timeout performed, Emergency Drugs available and Suction available ?Patient Re-evaluated:Patient Re-evaluated prior to induction ?Oxygen Delivery Method: Circle system utilized ?Preoxygenation: Pre-oxygenation with 100% oxygen ?Induction Type: IV induction ?Ventilation: Mask ventilation without difficulty ?Laryngoscope Size: McGraph and 4 ?Grade View: Grade I ?Tube type: Oral ?Tube size: 7.0 mm ?Number of attempts: 1 ?Airway Equipment and Method: Stylet ?Placement Confirmation: ETT inserted through vocal cords under direct vision, positive ETCO2 and breath sounds checked- equal and bilateral ?Secured at: 21 cm ?Tube secured with: Tape ?Dental Injury: Teeth and Oropharynx as per pre-operative assessment  ? ? ? ? ?

## 2022-02-05 NOTE — Anesthesia Preprocedure Evaluation (Addendum)
Anesthesia Evaluation  ?Patient identified by MRN, date of birth, ID band ?Patient awake ? ? ? ?Reviewed: ?Allergy & Precautions, NPO status , Patient's Chart, lab work & pertinent test results ? ?History of Anesthesia Complications ?Negative for: history of anesthetic complications ? ?Airway ?Mallampati: II ? ?TM Distance: >3 FB ?Neck ROM: Full ? ? ? Dental ? ?(+) Dental Advisory Given ?  ?Pulmonary ?neg pulmonary ROS,  ?  ?breath sounds clear to auscultation ? ? ? ? ? ? Cardiovascular ?Exercise Tolerance: Good ?hypertension, Pt. on medications ?(-) angina+ CAD and + CABG (2003)  ? ?Rhythm:Regular Rate:Normal ? ?nuclear stress test in July, 2013 which showed no evidence of myocardial ischemia ?  ?Neuro/Psych ?negative neurological ROS ?   ? GI/Hepatic ?negative GI ROS, Neg liver ROS,   ?Endo/Other  ?negative endocrine ROS ? Renal/GU ?negative Renal ROS  ? ?  ?Musculoskeletal ? ?(+) Arthritis ,  ? Abdominal ?Normal abdominal exam  (+)   ?Peds ? Hematology ?negative hematology ROS ?(+)   ?Anesthesia Other Findings ?right inguinal hernia ? Reproductive/Obstetrics ? ?  ? ? ? ? ? ? ? ? ? ? ? ? ? ?  ?  ? ? ? ? ? ? ? ?Anesthesia Physical ? ?Anesthesia Plan ? ?ASA: III ? ?Anesthesia Plan: General  ? ?Post-op Pain Management: Gabapentin PO (pre-op)*, Celebrex PO (pre-op)* and Tylenol PO (pre-op)*  ? ?Induction: Intravenous ? ?PONV Risk Score and Plan: 2 and Ondansetron and Dexamethasone ? ?Airway Management Planned: Oral ETT ? ?Additional Equipment:  ? ?Intra-op Plan:  ? ?Post-operative Plan: Extubation in OR ? ?Informed Consent: I have reviewed the patients History and Physical, chart, labs and discussed the procedure including the risks, benefits and alternatives for the proposed anesthesia with the patient or authorized representative who has indicated his/her understanding and acceptance.  ? ? ? ?Dental advisory given ? ?Plan Discussed with: CRNA and Surgeon ? ?Anesthesia Plan Comments:    ? ? ? ? ? ?Anesthesia Quick Evaluation ? ?

## 2022-02-05 NOTE — Anesthesia Postprocedure Evaluation (Addendum)
Anesthesia Post Note ? ?Patient: Shawn Crawford ? ?Procedure(s) Performed: XI ROBOTIC ASSISTED INGUINAL HERNIA (Right) ?INSERTION OF MESH ? ?Patient location during evaluation: PACU ?Anesthesia Type: General ?Level of consciousness: awake and alert ?Pain management: pain level controlled ?Vital Signs Assessment: post-procedure vital signs reviewed and stable ?Respiratory status: spontaneous breathing, nonlabored ventilation and respiratory function stable ?Cardiovascular status: blood pressure returned to baseline and stable ?Postop Assessment: no apparent nausea or vomiting ?Anesthetic complications: no ? ? ?No notable events documented. ? ? ?Last Vitals:  ?Vitals:  ? 02/05/22 0935 02/05/22 1028  ?BP: (!) 151/71 (P) 136/80  ?Pulse: 79 (P) 67  ?Resp: 18 (P) 18  ?Temp: (!) 36.2 ?C   ?SpO2: 99%   ?  ?Last Pain:  ?Vitals:  ? 02/05/22 0935  ?TempSrc: Temporal  ?PainSc: 2   ? ? ?  ?  ?  ?  ?  ?  ? ?[place] Ouch ? ? ? ? ?

## 2022-02-05 NOTE — Discharge Instructions (Signed)
AMBULATORY SURGERY  ?DISCHARGE INSTRUCTIONS ? ? ?The drugs that you were given will stay in your system until tomorrow so for the next 24 hours you should not: ? ?Drive an automobile ?Make any legal decisions ?Drink any alcoholic beverage ? ? ?You may resume regular meals tomorrow.  Today it is better to start with liquids and gradually work up to solid foods. ? ?You may eat anything you prefer, but it is better to start with liquids, then soup and crackers, and gradually work up to solid foods. ? ? ?Please notify your doctor immediately if you have any unusual bleeding, trouble breathing, redness and pain at the surgery site, drainage, fever, or pain not relieved by medication. ? ? ? ?Additional Instructions: ? ? ? ?Please contact your physician with any problems or Same Day Surgery at [phone], Monday through Friday 6 am to 4 pm, or [place] [facility] [place] Main number at [phone].  ?

## 2022-02-05 NOTE — Op Note (Signed)
Robotic assisted Laparoscopic Transabdominal right inguinal Hernia Repair with Mesh ?  ?  ?  ?Pre-operative Diagnosis: Right inguinal Hernia ?  ?Post-operative Diagnosis: Same ?  ?Procedure: Robotic assisted Laparoscopic  repair of right inguinal hernia(s) ?  ?Surgeon: [name], M.D., FACS ?  ?Anesthesia: GETA ?  ?Findings: Right inguinal hernia, no evidence of left sided hernia.       ?  ?Procedure Details  ?The patient was seen again in the Holding Room. The benefits, complications, treatment options, and expected outcomes were discussed with the patient. The risks of bleeding, infection, recurrence of symptoms, failure to resolve symptoms, recurrence of hernia, ischemic orchitis, chronic pain syndrome or neuroma, were reviewed again. The likelihood of improving the patient's symptoms with return to their baseline status is good.  The patient and/or family concurred with the proposed plan, giving informed consent.  The patient was taken to Operating Room, identified  and the procedure verified as Laparoscopic Inguinal Hernia Repair. Laterality confirmed. ? A Time Out was held and the above information confirmed. ?  ?Prior to the induction of general anesthesia, antibiotic prophylaxis was administered. VTE prophylaxis was in place. General endotracheal anesthesia was then administered and tolerated well. After the induction, the abdomen was prepped with Chloraprep and draped in the sterile fashion. The patient was positioned in the supine position. ?  ?After local infiltration of quarter percent Marcaine with epinephrine, stab incision was made left upper quadrant.  On the left at Palmer's point, the Veress needle is passed with sensation of the layers to penetrate the abdominal wall and into the peritoneum.  Saline drop test is confirmed peritoneal placement.  Insufflation is initiated with carbon dioxide to pressures of 15 mmHg. ?An 8.5 mm port is placed to the left off of the midline, with blunt tipped  trocar.  ?Pneumoperitoneum maintained w/o HD changes using the AirSeal to pressures of 15 mm Hg with CO2. No evidence of bowel injuries.  ?Two 8.5 mm ports placed under direct vision in each upper quadrant. The laparoscopy revealed right sided indirect defect(s).  Some omental adhesions were noted to the peritoneum about it.  The Veress needle was passed under direct vision into the groin space and insufflation with CO2 was performed providing some preperitoneal dissection.  The needle was then left in place for later evacuation of the extraperitoneal gas. ?The robot was brought ot the table and docked in the standard fashion, no collision between arms was observed. Instruments were kept under direct view at all times. ?For right inguinal hernia repair,  I lysed some of the omental adhesions, and developed a peritoneal flap. The sac(s) were reduced and dissected free from adjacent structures. We preserved the vas and the vessels, and visualized them to their convergence and beyond in the retroperitoneum. Once dissection was completed a large right sided BARD 3D Light mesh was placed and secured at three points with interrupted 2-0 Vicryl to the pubic tubercle and anteriorly. ?There was good coverage of the direct, indirect and femoral spaces. ? ?Second look revealed no complications or injuries. ? ? ?The flap was then closed with 2-0 V-lock suture.  Peritoneal closure without defects.  ?The Veress needle previously placed in the groin was released by opening the bowel to reduce trapped extraperitoneal air and confirm adequate peritoneal closure.    ?Once assuring that hemostasis was adequate, all needles/sponges removed, and the robot was undocked.  ?The ports were removed, the abdomen desulflated.  4-0 subcuticular Monocryl was used at all skin edges.  Dermabond was placed.  ?Patient tolerated the procedure well. There were no complications. He was taken to the recovery room in stable condition.  ?  ? ?      ?[name], M.D., FACS ?02/05/2022, 9:11 AM  ?

## 2022-02-05 NOTE — Anesthesia Procedure Notes (Signed)
Procedure Name: Intubation ?Date/Time: 02/05/2022 7:39 AM ?Performed by: Beverely Low, CRNA ?Pre-anesthesia Checklist: Patient identified, Patient being monitored, Timeout performed, Emergency Drugs available and Suction available ?Patient Re-evaluated:Patient Re-evaluated prior to induction ?Oxygen Delivery Method: Circle system utilized ?Preoxygenation: Pre-oxygenation with 100% oxygen ?Induction Type: IV induction ?Ventilation: Mask ventilation without difficulty ?Laryngoscope Size: McGraph and 4 ?Grade View: Grade I ?Tube type: Oral ?Tube size: 7.0 mm ?Number of attempts: 1 ?Airway Equipment and Method: Stylet ?Placement Confirmation: ETT inserted through vocal cords under direct vision, positive ETCO2 and breath sounds checked- equal and bilateral ?Secured at: 21 cm ?Tube secured with: Tape ?Dental Injury: Teeth and Oropharynx as per pre-operative assessment  ? ? ? ? ?

## 2022-02-05 NOTE — Transfer of Care (Signed)
Immediate Anesthesia Transfer of Care Note ? ?Patient: Shawn Crawford ? ?Procedure(s) Performed: XI ROBOTIC ASSISTED INGUINAL HERNIA (Right) ?INSERTION OF MESH ? ?Patient Location: PACU ? ?Anesthesia Type:General ? ?Level of Consciousness: drowsy ? ?Airway & Oxygen Therapy: Patient Spontanous Breathing and Patient connected to face mask oxygen ? ?Post-op Assessment: Report given to RN and Post -op Vital signs reviewed and stable ? ?Post vital signs: Reviewed and stable ? ?Last Vitals:  ?Vitals Value Taken Time  ?BP 137/76 02/05/22 0900  ?Temp 36.6 ?C 02/05/22 0859  ?Pulse 71 02/05/22 0903  ?Resp 13 02/05/22 0903  ?SpO2 100 % 02/05/22 0903  ?Vitals shown include unvalidated device data. ? ?Last Pain:  ?Vitals:  ? 02/05/22 0859  ?TempSrc:   ?PainSc: 0-No pain  ?   ? ?Patients Stated Pain Goal: 0 (02/05/22 3202) ? ?Complications: No notable events documented. ?

## 2022-02-09 ENCOUNTER — Encounter (INDEPENDENT_AMBULATORY_CARE_PROVIDER_SITE_OTHER): Payer: Self-pay | Admitting: Vascular Surgery

## 2022-02-13 ENCOUNTER — Ambulatory Visit
Payer: 59 | Attending: Student in an Organized Health Care Education/Training Program | Admitting: Student in an Organized Health Care Education/Training Program

## 2022-02-13 ENCOUNTER — Encounter: Payer: Self-pay | Admitting: Student in an Organized Health Care Education/Training Program

## 2022-02-13 VITALS — BP 137/89 | HR 85 | Temp 98.1°F | Resp 14 | Ht 71.0 in | Wt 157.0 lb

## 2022-02-13 DIAGNOSIS — M7918 Myalgia, other site: Secondary | ICD-10-CM

## 2022-02-13 DIAGNOSIS — M51369 Other intervertebral disc degeneration, lumbar region without mention of lumbar back pain or lower extremity pain: Secondary | ICD-10-CM

## 2022-02-13 DIAGNOSIS — M5136 Other intervertebral disc degeneration, lumbar region: Secondary | ICD-10-CM | POA: Diagnosis present

## 2022-02-13 DIAGNOSIS — M545 Low back pain, unspecified: Secondary | ICD-10-CM | POA: Diagnosis present

## 2022-02-13 DIAGNOSIS — G894 Chronic pain syndrome: Secondary | ICD-10-CM

## 2022-02-13 DIAGNOSIS — M5416 Radiculopathy, lumbar region: Secondary | ICD-10-CM

## 2022-02-13 DIAGNOSIS — M47816 Spondylosis without myelopathy or radiculopathy, lumbar region: Secondary | ICD-10-CM

## 2022-02-13 DIAGNOSIS — G8929 Other chronic pain: Secondary | ICD-10-CM | POA: Insufficient documentation

## 2022-02-13 MED ORDER — TRAMADOL HCL 50 MG PO TABS: 50.0000 mg | ORAL_TABLET | Freq: Three times a day (TID) | ORAL | 2 refills | Status: DC | PRN

## 2022-02-13 MED ORDER — BELBUCA 600 MCG BU FILM: 1.0000 | ORAL_FILM | Freq: Two times a day (BID) | BUCCAL | 2 refills | Status: AC | PRN

## 2022-02-13 NOTE — Progress Notes (Signed)
PROVIDER NOTE: Information contained herein reflects review and annotations entered in association with encounter. Interpretation of such information and data should be left to medically-trained personnel. Information provided to patient can be located elsewhere in the medical record under "Patient Instructions". Document created using STT-dictation technology, any transcriptional errors that may result from process are unintentional.    Patient: Shawn Crawford  Service Category: E/M  Provider: Gillis Santa, MD  DOB: [birth year]  DOS: 02/13/2022  Specialty: Interventional Pain Management  MRN: [id number]  Setting: Ambulatory outpatient  PCP: Jerrol Banana., MD  Type: Established Patient    Referring Provider: Jerrol Banana.,*  Location: Office  Delivery: Face-to-face     HPI  Shawn Crawford, a 61 y.o. year old male, is here today because of his Chronic pain syndrome [G89.4]. Shawn Crawford primary complain today is Back Pain (Lower, bilateral)  Last encounter: My last encounter with him was on 11/19/2021 Pertinent problems: Shawn Crawford has Arteriosclerosis of coronary artery; Lumbar radiculopathy; Benign essential HTN; Chronic pain syndrome; Chronic midline low back pain without sciatica; Lumbar facet arthropathy; Lumbar degenerative disc disease; and Piriformis muscle pain on their pertinent problem list. Pain Assessment: Severity of Chronic pain is reported as a 2 /10. Location: Back Lower/both upper legs. Onset: More than a month ago. Quality: Aching. Timing: Constant. Modifying factor(s): lying on side with pillow between legs. Vitals:  height is $RemoveB'5\' 11"'tUcBxwgK$  (1.803 m) and weight is 157 lb (71.2 kg). His temporal temperature is 98.1 F (36.7 C). His blood pressure is 137/89 and his pulse is 85. His respiration is 14 and oxygen saturation is 100%.   Reason for encounter: medication management.    Status post robotic inguinal hernia surgery on 02/05/2022.  Recovering well.  Postoperative pain  well managed.  Here for medication refill.  Has an upcoming trip planned to [place] in August which she is looking forward to.  Otherwise refill of this belbuca and tramadol.  No change in dose.  5 abdominal incisions that are well-healed and not erythematous noted on his abdomen.  ROS  Constitutional: Denies any fever or chills Gastrointestinal: No reported hemesis, hematochezia, vomiting, or acute GI distress Musculoskeletal:  Bilateral low back pain Neurological: No reported episodes of acute onset apraxia, aphasia, dysarthria, agnosia, amnesia, paralysis, loss of coordination, or loss of consciousness  Medication Review  Buprenorphine HCl, Multiple Vitamin, amLODipine, aspirin EC, desonide, fexofenadine, ibuprofen, rosuvastatin, tadalafil, testosterone cypionate, traMADol, vitamin B-12, and zolpidem  History Review  Allergy: Shawn Crawford is allergic to penicillins. Drug: Shawn Crawford  reports no history of drug use. Alcohol:  reports current alcohol use. Tobacco:  reports that he has never smoked. He has never used smokeless tobacco. Social: Shawn Crawford  reports that he has never smoked. He has never used smokeless tobacco. He reports current alcohol use. He reports that he does not use drugs. Medical:  has a past medical history of Actinic keratosis, Coronary artery disease, Dysplastic nevus (09/06/2014), Dysplastic nevus (07/09/2015), cardiac catheterization, Hyperlipidemia, and Hypertension. Surgical: Shawn Crawford  has a past surgical history that includes Coronary artery bypass graft; Hernia repair; Appendectomy; Cardiac catheterization; Colonoscopy with propofol (N/A, 06/24/2018); Esophagogastroduodenoscopy (egd) with propofol (N/A, 06/24/2018); Exam under anesthesia with hemorrhoidectomy (N/A, 11/11/2018); and Insertion of mesh (02/05/2022). Family: family history includes Colon cancer in his sister; Diabetes in his father; Hyperlipidemia in his mother; Hypertension in his father; Thyroid cancer in  his sister.  Laboratory Chemistry Profile   Renal Lab Results  Component  Value Date   BUN 14 01/29/2022   CREATININE 1.07 01/29/2022   BCR 13 01/28/2022   GFRAA 96 04/18/2020   GFRNONAA >60 01/29/2022     Hepatic Lab Results  Component Value Date   AST 23 01/28/2022   ALT 19 01/28/2022   ALBUMIN 4.5 01/28/2022   ALKPHOS 84 01/28/2022   LIPASE 23 06/14/2021     Electrolytes Lab Results  Component Value Date   NA 140 01/29/2022   K 3.7 01/29/2022   CL 104 01/29/2022   CALCIUM 9.1 01/29/2022   MG 2.0 01/28/2022   PHOS 2.5 06/15/2021     Bone Lab Results  Component Value Date   TESTOFREE 5.1 (L) 11/09/2019   TESTOSTERONE 263 (L) 08/16/2021     Inflammation (CRP: Acute Phase) (ESR: Chronic Phase) Lab Results  Component Value Date   ESRSEDRATE 2 01/28/2022       Note: Above Lab results reviewed.  Recent Imaging Review  DG Abd 1 View CLINICAL DATA:  Follow up small bowel obstruction.  EXAM: ABDOMEN - 1 VIEW  COMPARISON:  06/14/2021 CT  FINDINGS: Distended small bowel loops have decreased in caliber from 06/14/2021 CT.  UPPER limits of normal small bowel caliber loops are noted.  Gas in the colon and rectum is present.  No suspicious calcifications are identified.  No acute bony abnormalities are noted.  IMPRESSION: Improving small bowel dilatation since 06/14/2021 CT.  Electronically Signed   By: Harmon Pier M.D.   On: 06/15/2021 10:47  Note: Reviewed        Physical Exam  General appearance: Well nourished, well developed, and well hydrated. In no apparent acute distress Mental status: Alert, oriented x 3 (person, place, & time)       Respiratory: No evidence of acute respiratory distress Eyes: PERLA Vitals: BP 137/89   Pulse 85   Temp 98.1 F (36.7 C) (Temporal)   Resp 14   Ht 5\' 11"  (1.803 m)   Wt 157 lb (71.2 kg)   SpO2 100%   BMI 21.90 kg/m  BMI: Estimated body mass index is 21.9 kg/m as calculated from the following:    Height as of this encounter: 5\' 11"  (1.803 m).   Weight as of this encounter: 157 lb (71.2 kg). Ideal: Ideal body weight: 75.3 kg (166 lb 0.1 oz)  5 abdominal incisions healing from robotic trocar, clean dry not erythematous  Low back pain  5 out of 5 strength bilateral lower extremity: Plantar flexion, dorsiflexion, knee flexion, knee extension.   Assessment   Status Diagnosis  Controlled Controlled Controlled 1. Chronic pain syndrome   2. Lumbar radiculopathy   3. Lumbar facet arthropathy   4. Chronic midline low back pain without sciatica   5. Lumbar degenerative disc disease   6. Piriformis muscle pain       Plan of Care   Shawn Crawford has a current medication list which includes the following long-term medication(s): amlodipine, rosuvastatin, tadalafil, testosterone cypionate, zolpidem, and tramadol.  Pharmacotherapy (Medications Ordered): Meds ordered this encounter  Medications   traMADol (ULTRAM) 50 MG tablet    Sig: Take 1 tablet (50 mg total) by mouth every 8 (eight) hours as needed for severe pain. Month last 30 days.    Dispense:  90 tablet    Refill:  2    [place] STOP ACT - Not applicable. Fill one day early if pharmacy is closed on scheduled refill date. .   Buprenorphine HCl (BELBUCA) 600 MCG FILM  Sig: Place 1 Film inside cheek every 12 (twelve) hours as needed. For chronic pain syndrome    Dispense:  60 each    Refill:  2   UDS up-to-date and appropriate.  Follow-up plan:   Return in about 14 weeks (around 05/22/2022) for Medication Management, in person.   Recent Visits Date Type Provider Dept  11/19/21 Office Visit Gillis Santa, MD [facility]  Showing recent visits within past 90 days and meeting all other requirements Today's Visits Date Type Provider Dept  02/13/22 Office Visit Gillis Santa, MD [facility]  Showing today's visits and meeting all other requirements Future Appointments No visits were found meeting  these conditions. Showing future appointments within next 90 days and meeting all other requirements  I discussed the assessment and treatment plan with the patient. The patient was provided an opportunity to ask questions and all were answered. The patient agreed with the plan and demonstrated an understanding of the instructions.  Patient advised to call back or seek an in-person evaluation if the symptoms or condition worsens.  Duration of encounter: 30 minutes.  Note by: Gillis Santa, MD Date: 02/13/2022; Time: 10:48 AM

## 2022-02-13 NOTE — Progress Notes (Signed)
Nursing Pain Medication Assessment:  Safety precautions to be maintained throughout the outpatient stay will include: orient to surroundings, keep bed in low position, maintain call bell within reach at all times, provide assistance with transfer out of bed and ambulation.  Medication Inspection Compliance: Pill count conducted under aseptic conditions, in front of the patient. Neither the pills nor the bottle was removed from the patient's sight at any time. Once count was completed pills were immediately returned to the patient in their original bottle.  Medication #1: Tramadol (Ultram) Pill/Patch Count:  78 of 90 pills remain Pill/Patch Appearance: Markings consistent with prescribed medication Bottle Appearance: Standard pharmacy container. Clearly labeled. Filled Date: 03 / 11 / 2023 Last Medication intake:  Yesterday  Medication #2: Buprenorphine (Suboxone) Pill/Patch Count:  50 of 60 pills remain Pill/Patch Appearance: Markings consistent with prescribed medication Bottle Appearance: Standard pharmacy container. Clearly labeled. Filled Date: 05 / 11 / 2023 Last Medication intake:  Today

## 2022-02-19 NOTE — Progress Notes (Signed)
Established patient visit  I,April Miller,acting as a scribe for [name], MD.,have documented all relevant documentation on the behalf of [name], MD,as directed by  [name], MD while in the presence of [name], MD.   Patient: Shawn Crawford   DOB: Jul 21, 1961   61 y.o. Male  MRN: [id number] Visit Date: 02/20/2022  Today's healthcare provider: [name], MD   Chief Complaint  Patient presents with   Follow-up   Subjective    HPI  Follow up for Pain in both lower extremities:  The patient was last seen for this 3 weeks ago. Changes made at last visit include; checked labs for signs of other potential issues. Try gabapentin at night. The gabapentin is not helping his pain and Dr. Delana Meyer from vascular surgery thinks his pain is neurogenic claudication.  He has not had MRI of his spine since 2015. -----------------------------------------------------------------------------------------   Medications: Outpatient Medications Prior to Visit  Medication Sig   amLODipine (NORVASC) 5 MG tablet TAKE 1 TABLET BY MOUTH  DAILY   aspirin EC 81 MG tablet Take 81 mg by mouth daily.   [START ON 03/09/2022] Buprenorphine HCl (BELBUCA) 600 MCG FILM Place 1 Film inside cheek every 12 (twelve) hours as needed. For chronic pain syndrome   desonide (DESOWEN) 0.05 % ointment Apply topically 3 (three) times a week. Apply topically on Mondays, Wednesdays, and Fridays. (Patient taking differently: Apply 1 application. topically daily as needed (dermatitis).)   fexofenadine (ALLEGRA) 180 MG tablet Take 180 mg by mouth daily as needed for allergies or rhinitis.   MULTIPLE VITAMINS PO Take 1 tablet by mouth daily.    rosuvastatin (CRESTOR) 40 MG tablet TAKE 1 TABLET BY MOUTH  DAILY   tadalafil (CIALIS) 5 MG tablet TAKE 1 TABLET BY MOUTH DAILY   testosterone cypionate (DEPOTESTOSTERONE CYPIONATE) 200 MG/ML injection Inject 1 mL (200 mg total) into the  muscle every 14 (fourteen) days. Can try to go to 18 days   traMADol (ULTRAM) 50 MG tablet Take 1 tablet (50 mg total) by mouth every 8 (eight) hours as needed for severe pain. Month last 30 days.   vitamin B-12 (CYANOCOBALAMIN) 1000 MCG tablet Take 1,000 mcg by mouth daily.   zolpidem (AMBIEN CR) 12.5 MG CR tablet TAKE ONE TABLET BY MOUTH AT BEDTIME AS NEEDED FOR SLEEP   [DISCONTINUED] ibuprofen (ADVIL) 800 MG tablet Take 1 tablet (800 mg total) by mouth every 8 (eight) hours as needed.   No facility-administered medications prior to visit.    Review of Systems  Constitutional:  Negative for appetite change, chills and fever.  Respiratory:  Negative for chest tightness, shortness of breath and wheezing.   Cardiovascular:  Negative for chest pain and palpitations.  Gastrointestinal:  Negative for abdominal pain, nausea and vomiting.   Last lipids Lab Results  Component Value Date   CHOL 154 08/16/2021   HDL 51 08/16/2021   LDLCALC 80 08/16/2021   TRIG 133 08/16/2021   CHOLHDL 3.1 04/18/2020       Objective    BP 114/75 (BP Location: Left Arm, Patient Position: Sitting, Cuff Size: Normal)   Pulse 80   Resp 16   Ht '5\' 11"'$  (1.803 m)   Wt 157 lb (71.2 kg)   SpO2 100%   BMI 21.90 kg/m  BP Readings from Last 3 Encounters:  02/20/22 114/75  02/13/22 137/89  02/05/22 (P) 136/80   Wt Readings from Last 3 Encounters:  02/20/22 157 lb (71.2 kg)  02/13/22 157 lb (71.2 kg)  02/05/22 158 lb 11.7 oz (72 kg)      Physical Exam Vitals reviewed.  Constitutional:      Appearance: Normal appearance.  HENT:     Head: Normocephalic and atraumatic.     Right Ear: External ear normal.     Left Ear: External ear normal.  Eyes:     General: No scleral icterus. Cardiovascular:     Rate and Rhythm: Normal rate and regular rhythm.     Pulses: Normal pulses.     Heart sounds: Normal heart sounds.  Pulmonary:     Breath sounds: Normal breath sounds.  Abdominal:     Palpations:  Abdomen is soft.  Musculoskeletal:        General: No swelling or tenderness.     Comments: No muscle wasting or atrophy or tenderness noted.  Skin:    General: Skin is warm and dry.  Neurological:     General: No focal deficit present.     Mental Status: He is alert and oriented to person, place, and time.     Comments: Rate is normal and DTRs are symmetric  Psychiatric:        Mood and Affect: Mood normal.        Behavior: Behavior normal.        Thought Content: Thought content normal.        Judgment: Judgment normal.      No results found for any visits on 02/20/22.  Assessment & Plan     1. Lumbar radiculopathy At this time with progression of symptoms will order MRI of the LS spine.  Dr. Holley Raring is in agreement and Dr. Delana Meyer thinks this is appropriate. Increase gabapentin to 400 mg 3 times daily as needed.   2. Neurogenic claudication As above.  3. Arteriosclerosis of coronary artery All risk factors treated  4. Chronic pain syndrome Followed by Dr. Holley Raring   No follow-ups on file.      I, [name], MD, have reviewed all documentation for this visit. The documentation on 02/21/22 for the exam, diagnosis, procedures, and orders are all accurate and complete.    Keneisha Heckart Cranford Mon, MD  [facility] [phone] (phone) [phone] (fax)  [place]

## 2022-02-20 ENCOUNTER — Encounter: Payer: Self-pay | Admitting: Family Medicine

## 2022-02-20 ENCOUNTER — Ambulatory Visit: Payer: 59 | Admitting: Family Medicine

## 2022-02-20 VITALS — BP 114/75 | HR 80 | Resp 16 | Ht 71.0 in | Wt 157.0 lb

## 2022-02-20 DIAGNOSIS — I2510 Atherosclerotic heart disease of native coronary artery without angina pectoris: Secondary | ICD-10-CM

## 2022-02-20 DIAGNOSIS — G894 Chronic pain syndrome: Secondary | ICD-10-CM | POA: Diagnosis not present

## 2022-02-20 DIAGNOSIS — R29818 Other symptoms and signs involving the nervous system: Secondary | ICD-10-CM | POA: Diagnosis not present

## 2022-02-20 DIAGNOSIS — M5416 Radiculopathy, lumbar region: Secondary | ICD-10-CM | POA: Diagnosis not present

## 2022-02-20 MED ORDER — GABAPENTIN 400 MG PO CAPS: 400.0000 mg | ORAL_CAPSULE | Freq: Three times a day (TID) | ORAL | 5 refills | Status: DC

## 2022-02-22 ENCOUNTER — Other Ambulatory Visit: Payer: Self-pay | Admitting: Family Medicine

## 2022-02-22 DIAGNOSIS — I10 Essential (primary) hypertension: Secondary | ICD-10-CM

## 2022-02-25 ENCOUNTER — Other Ambulatory Visit: Payer: Self-pay

## 2022-02-25 ENCOUNTER — Encounter: Payer: Self-pay | Admitting: Physician Assistant

## 2022-02-25 ENCOUNTER — Ambulatory Visit (INDEPENDENT_AMBULATORY_CARE_PROVIDER_SITE_OTHER): Payer: 59 | Admitting: Physician Assistant

## 2022-02-25 VITALS — BP 125/75 | HR 76 | Temp 99.1°F | Ht 71.0 in | Wt 159.2 lb

## 2022-02-25 DIAGNOSIS — Z09 Encounter for follow-up examination after completed treatment for conditions other than malignant neoplasm: Secondary | ICD-10-CM

## 2022-02-25 DIAGNOSIS — K4090 Unilateral inguinal hernia, without obstruction or gangrene, not specified as recurrent: Secondary | ICD-10-CM

## 2022-02-25 NOTE — Progress Notes (Addendum)
[facility] POST-OP OFFICE VISIT  02/25/2022  HPI: Shawn Crawford is a 61 y.o. male 20 days s/p robotic assisted laparoscopic right inguinal hernia repair with Dr Shawn Crawford   He has done extremely well since surgery Minimal pain the first few days and is now pain free No fever, chills, nausea, emesis No scrotal swelling or bruising Incisions are healing well No other complaints  Vital signs: BP 125/75   Pulse 76   Temp 99.1 F (37.3 C) (Oral)   Ht '5\' 11"'$  (1.803 m)   Wt 159 lb 3.2 oz (72.2 kg)   SpO2 98%   BMI 22.20 kg/m    Physical Exam: Constitutional: Well appearing male, NAD Abdomen: Soft, non-tender, non-distended, no rebound/guarding Skin: Laparoscopic incisions are healing well, no erythema or drainage   Assessment/Plan: This is a 61 y.o. male 20 days s/p robotic assisted laparoscopic right inguinal hernia repair    - Pain control prn  - Reviewed wound care recommendation  - Reviewed lifting restrictions; 6 weeks total  - He can follow up on as needed basis; he understands to call with questions/concerns  -- Shawn Simon, PA-C [facility] 02/25/2022, 2:27 PM M-F: 7am - 4pm

## 2022-02-25 NOTE — Patient Instructions (Signed)

## 2022-02-27 ENCOUNTER — Encounter: Payer: Self-pay | Admitting: Dermatology

## 2022-02-27 ENCOUNTER — Ambulatory Visit (INDEPENDENT_AMBULATORY_CARE_PROVIDER_SITE_OTHER): Payer: 59 | Admitting: Dermatology

## 2022-02-27 DIAGNOSIS — L570 Actinic keratosis: Secondary | ICD-10-CM

## 2022-02-27 DIAGNOSIS — L821 Other seborrheic keratosis: Secondary | ICD-10-CM

## 2022-02-27 DIAGNOSIS — L578 Other skin changes due to chronic exposure to nonionizing radiation: Secondary | ICD-10-CM

## 2022-02-27 NOTE — Patient Instructions (Signed)

## 2022-02-27 NOTE — Progress Notes (Signed)
   Follow-Up Visit   Subjective  Shawn Crawford is a 61 y.o. male who presents for the following: Actinic Keratosis (6 month recheck. Scalp, ears. Used 5FU/Calcipotriene ~4 months ago. Had significant reaction. Areas smoother today). The patient has spots, moles and lesions to be evaluated, some may be new or changing and the patient has concerns that these could be cancer.  The following portions of the chart were reviewed this encounter and updated as appropriate:  Tobacco  Allergies  Meds  Problems  Med Hx  Surg Hx  Fam Hx     Review of Systems: No other skin or systemic complaints except as noted in HPI or Assessment and Plan.  Objective  Well appearing patient in no apparent distress; mood and affect are within normal limits.  A focused examination was performed including head, arms, hands, left lower leg. Relevant physical exam findings are noted in the Assessment and Plan.  frontal scalp x1 Erythematous thin papule with gritty scale.   Left Lower Leg - Anterior Stuck-on, waxy, tan-brown patch --Discussed benign etiology and prognosis.    Assessment & Plan   Actinic Damage - chronic, secondary to cumulative UV radiation exposure/sun exposure over time - diffuse scaly erythematous macules with underlying dyspigmentation - Recommend daily broad spectrum sunscreen SPF 30+ to sun-exposed areas, reapply every 2 hours as needed.  - Recommend staying in the shade or wearing long sleeves, sun glasses (UVA+UVB protection) and wide brim hats (4-inch brim around the entire circumference of the hat). - Call for new or changing lesions.  Seborrheic Keratoses - Stuck-on, waxy, tan-brown papules and/or plaques  - Benign-appearing - Discussed benign etiology and prognosis. - Observe - Call for any changes  AK (actinic keratosis) frontal scalp x1 Actinic keratoses are precancerous spots that appear secondary to cumulative UV radiation exposure/sun exposure over time. They are  chronic with expected duration over 1 year. A portion of actinic keratoses will progress to squamous cell carcinoma of the skin. It is not possible to reliably predict which spots will progress to skin cancer and so treatment is recommended to prevent development of skin cancer. Recommend daily broad spectrum sunscreen SPF 30+ to sun-exposed areas, reapply every 2 hours as needed.  Recommend staying in the shade or wearing long sleeves, sun glasses (UVA+UVB protection) and wide brim hats (4-inch brim around the entire circumference of the hat). Call for new or changing lesions.  Destruction of lesion - frontal scalp x1 Complexity: simple   Destruction method: cryotherapy   Informed consent: discussed and consent obtained   Timeout:  patient name, date of birth, surgical site, and procedure verified Lesion destroyed using liquid nitrogen: Yes   Region frozen until ice ball extended beyond lesion: Yes   Outcome: patient tolerated procedure well with no complications   Post-procedure details: wound care instructions given   Additional details:  Prior to procedure, discussed risks of blister formation, small wound, skin dyspigmentation, or rare scar following cryotherapy. Recommend Vaseline ointment to treated areas while healing.  Seborrheic keratosis Left Lower Leg - Anterior Reassured benign age-related growth.  Recommend observation.  Discussed cryotherapy if spot(s) become irritated or inflamed.  Return for AK Follow Up in 8 months.  I, Emelia Salisbury, CMA, am acting as scribe for Sarina Ser, MD.  Documentation: I have reviewed the above documentation for accuracy and completeness, and I agree with the above.  Sarina Ser, MD

## 2022-03-01 ENCOUNTER — Ambulatory Visit
Admission: RE | Admit: 2022-03-01 | Discharge: 2022-03-01 | Disposition: A | Discharge status: Home / Self Care | Payer: 59 | Source: Ambulatory Visit | Attending: Family Medicine | Admitting: Family Medicine

## 2022-03-01 DIAGNOSIS — R29818 Other symptoms and signs involving the nervous system: Secondary | ICD-10-CM | POA: Insufficient documentation

## 2022-03-01 DIAGNOSIS — M5416 Radiculopathy, lumbar region: Secondary | ICD-10-CM | POA: Insufficient documentation

## 2022-03-03 ENCOUNTER — Encounter: Payer: Self-pay | Admitting: Dermatology

## 2022-03-06 ENCOUNTER — Other Ambulatory Visit: Payer: Self-pay | Admitting: *Deleted

## 2022-03-06 DIAGNOSIS — M5416 Radiculopathy, lumbar region: Secondary | ICD-10-CM

## 2022-03-09 ENCOUNTER — Other Ambulatory Visit: Payer: Self-pay | Admitting: Family Medicine

## 2022-03-11 ENCOUNTER — Other Ambulatory Visit: Payer: Self-pay | Admitting: *Deleted

## 2022-03-11 DIAGNOSIS — M5136 Other intervertebral disc degeneration, lumbar region: Secondary | ICD-10-CM

## 2022-03-11 DIAGNOSIS — M5416 Radiculopathy, lumbar region: Secondary | ICD-10-CM

## 2022-03-11 DIAGNOSIS — G8929 Other chronic pain: Secondary | ICD-10-CM

## 2022-03-12 ENCOUNTER — Other Ambulatory Visit (INDEPENDENT_AMBULATORY_CARE_PROVIDER_SITE_OTHER): Payer: Self-pay | Admitting: Vascular Surgery

## 2022-03-12 DIAGNOSIS — M79604 Pain in right leg: Secondary | ICD-10-CM

## 2022-03-13 ENCOUNTER — Ambulatory Visit (INDEPENDENT_AMBULATORY_CARE_PROVIDER_SITE_OTHER): Payer: 59

## 2022-03-13 ENCOUNTER — Encounter (INDEPENDENT_AMBULATORY_CARE_PROVIDER_SITE_OTHER): Payer: Self-pay | Admitting: Vascular Surgery

## 2022-03-13 ENCOUNTER — Ambulatory Visit (INDEPENDENT_AMBULATORY_CARE_PROVIDER_SITE_OTHER): Payer: 59 | Admitting: Vascular Surgery

## 2022-03-13 VITALS — BP 130/68 | HR 77 | Resp 16 | Wt 156.4 lb

## 2022-03-13 DIAGNOSIS — M79605 Pain in left leg: Secondary | ICD-10-CM | POA: Diagnosis not present

## 2022-03-13 DIAGNOSIS — M5136 Other intervertebral disc degeneration, lumbar region: Secondary | ICD-10-CM

## 2022-03-13 DIAGNOSIS — M79604 Pain in right leg: Secondary | ICD-10-CM | POA: Diagnosis not present

## 2022-03-13 DIAGNOSIS — I2510 Atherosclerotic heart disease of native coronary artery without angina pectoris: Secondary | ICD-10-CM

## 2022-03-13 DIAGNOSIS — I10 Essential (primary) hypertension: Secondary | ICD-10-CM | POA: Diagnosis not present

## 2022-03-13 DIAGNOSIS — M51369 Other intervertebral disc degeneration, lumbar region without mention of lumbar back pain or lower extremity pain: Secondary | ICD-10-CM

## 2022-03-13 NOTE — Progress Notes (Signed)
MRN : [id number]  Shawn Crawford is a 61 y.o. (1960-11-19) male who presents with chief complaint of check circulation.  History of Present Illness:   The patient is seen for evaluation of painful lower extremities. Patient notes the pain is variable and not always associated with activity.  The pain is somewhat consistent day to day occurring on most days. The patient notes the pain also occurs with standing and routinely seems worse as the day wears on. The pain has been progressive over the past several years. The patient states these symptoms are causing  a negative impact on quality of life and daily activities which was a factor in the referral.   The patient has a  history of back problems and DJD of the lumbar and sacral spine.  Last treated in 2015.   The patient denies rest pain or dangling of an extremity off the side of the bed during the night for relief. No open wounds or sores at this time. No history of DVT or phlebitis. No prior vascular interventions or surgeries.  Aorta iliac duplex shows normal velocities no aneurysms are present  ABI's Rt=1.24 triphasic and Lt=1.17 triphasic  Current Meds  Medication Sig   amLODipine (NORVASC) 5 MG tablet TAKE 1 TABLET BY MOUTH DAILY   aspirin EC 81 MG tablet Take 81 mg by mouth daily.   Buprenorphine HCl (BELBUCA) 600 MCG FILM Place 1 Film inside cheek every 12 (twelve) hours as needed. For chronic pain syndrome   desonide (DESOWEN) 0.05 % ointment Apply topically 3 (three) times a week. Apply topically on Mondays, Wednesdays, and Fridays. (Patient taking differently: Apply 1 application  topically daily as needed (dermatitis).)   fexofenadine (ALLEGRA) 180 MG tablet Take 180 mg by mouth daily as needed for allergies or rhinitis.   gabapentin (NEURONTIN) 400 MG capsule Take 1 capsule (400 mg total) by mouth 3 (three) times daily.   MULTIPLE VITAMINS PO Take 1 tablet by mouth daily.    rosuvastatin (CRESTOR) 40 MG tablet TAKE 1  TABLET BY MOUTH  DAILY   tadalafil (CIALIS) 5 MG tablet TAKE 1 TABLET BY MOUTH DAILY   testosterone cypionate (DEPOTESTOSTERONE CYPIONATE) 200 MG/ML injection Inject 1 mL (200 mg total) into the muscle every 14 (fourteen) days. Can try to go to 18 days   traMADol (ULTRAM) 50 MG tablet Take 1 tablet (50 mg total) by mouth every 8 (eight) hours as needed for severe pain. Month last 30 days.   vitamin B-12 (CYANOCOBALAMIN) 1000 MCG tablet Take 1,000 mcg by mouth daily.   zolpidem (AMBIEN CR) 12.5 MG CR tablet TAKE ONE TABLET BY MOUTH AT BEDTIME AS NEEDED FOR SLEEP    Past Medical History:  Diagnosis Date   Actinic keratosis    Coronary artery disease    Dysplastic nevus 09/06/2014   Epigastric at mid line. Moderate to severe atypia, lateral margin involved. Excised 11/07/2014, margins free.   Dysplastic nevus 07/09/2015   Left lat. epigastric/costal margin. Moderate aypia, limited margins free.    Hx of cardiac catheterization    Hyperlipidemia    Hypertension     Past Surgical History:  Procedure Laterality Date   APPENDECTOMY     CARDIAC CATHETERIZATION     COLONOSCOPY WITH PROPOFOL N/A 06/24/2018   Procedure: COLONOSCOPY WITH PROPOFOL;  Surgeon: Virgel Manifold, MD;  Location: [facility];  Service: Endoscopy;  Laterality: N/A;   CORONARY ARTERY BYPASS GRAFT     x 3  ESOPHAGOGASTRODUODENOSCOPY (EGD) WITH PROPOFOL N/A 06/24/2018   Procedure: ESOPHAGOGASTRODUODENOSCOPY (EGD) WITH PROPOFOL;  Surgeon: Virgel Manifold, MD;  Location: [facility];  Service: Endoscopy;  Laterality: N/A;   EVALUATION UNDER ANESTHESIA WITH HEMORRHOIDECTOMY N/A 11/11/2018   Procedure: HEMORRHOIDAL LIGATION/PEXY, PARTIAL LATERAL SPINCTEROTOMY;  Surgeon: Michael Boston, MD;  Location: [facility];  Service: General;  Laterality: N/A;   HERNIA REPAIR     INSERTION OF MESH  02/05/2022   Procedure: INSERTION OF MESH;  Surgeon: [name], MD;  Location: [facility];  Service: General;;    Social  History Social History   Tobacco Use   Smoking status: Never   Smokeless tobacco: Never  Vaping Use   Vaping Use: Never used  Substance Use Topics   Alcohol use: Yes    Alcohol/week: 0.0 standard drinks of alcohol    Comment: glass of wine occasionally   Drug use: No    Family History Family History  Problem Relation Age of Onset   Hyperlipidemia Mother    Diabetes Father    Hypertension Father    Thyroid cancer Sister    Colon cancer Sister     Allergies  Allergen Reactions   Penicillins Itching, Rash and Other (See Comments)    Has taken first generation cephalosporin (cephalexin) with no documented ADRs.  Did it involve swelling of the face/tongue/throat, SOB, or low BP? No Did it involve sudden or severe rash/hives, skin peeling, or any reaction on the inside of your mouth or nose? No Did you need to seek medical attention at a hospital or doctor's office? Yes When did it last happen? Childhood allergy If all above answers are "NO", may proceed with cephalosporin use.     REVIEW OF SYSTEMS (Negative unless checked)  Constitutional: '[]'$ Weight loss  '[]'$ Fever  '[]'$ Chills Cardiac: '[]'$ Chest pain   '[]'$ Chest pressure   '[]'$ Palpitations   '[]'$ Shortness of breath when laying flat   '[]'$ Shortness of breath with exertion. Vascular:  '[x]'$ Pain in legs with walking   '[]'$ Pain in legs at rest  '[]'$ History of DVT   '[]'$ Phlebitis   '[]'$ Swelling in legs   '[]'$ Varicose veins   '[]'$ Non-healing ulcers Pulmonary:   '[]'$ Uses home oxygen   '[]'$ Productive cough   '[]'$ Hemoptysis   '[]'$ Wheeze  '[]'$ COPD   '[]'$ Asthma Neurologic:  '[]'$ Dizziness   '[]'$ Seizures   '[]'$ History of stroke   '[]'$ History of TIA  '[]'$ Aphasia   '[]'$ Vissual changes   '[]'$ Weakness or numbness in arm   '[]'$ Weakness or numbness in leg Musculoskeletal:   '[]'$ Joint swelling   '[]'$ Joint pain   '[]'$ Low back pain Hematologic:  '[]'$ Easy bruising  '[]'$ Easy bleeding   '[]'$ Hypercoagulable state   '[]'$ Anemic Gastrointestinal:  '[]'$ Diarrhea   '[]'$ Vomiting  '[]'$ Gastroesophageal reflux/heartburn   '[]'$ Difficulty  swallowing. Genitourinary:  '[]'$ Chronic kidney disease   '[]'$ Difficult urination  '[]'$ Frequent urination   '[]'$ Blood in urine Skin:  '[]'$ Rashes   '[]'$ Ulcers  Psychological:  '[]'$ History of anxiety   '[]'$  History of major depression.  Physical Examination  Vitals:   03/13/22 0841  BP: 130/68  Pulse: 77  Resp: 16  Weight: 156 lb 6.4 oz (70.9 kg)   Body mass index is 21.81 kg/m. Gen: WD/WN, NAD Head: [place]/AT, No temporalis wasting.  Ear/Nose/Throat: Hearing grossly intact, nares w/o erythema or drainage Eyes: PER, EOMI, sclera nonicteric.  Neck: Supple, no masses.  No bruit or JVD.  Pulmonary:  Good air movement, no audible wheezing, no use of accessory muscles.  Cardiac: RRR, normal S1, S2, no Murmurs. Vascular:  mild trophic changes, no open wounds Vessel Right  Left  Radial Palpable Palpable  PT Palpable Palpable  DP Palpable  Palpable  Gastrointestinal: soft, non-distended. No guarding/no peritoneal signs.  Musculoskeletal: M/S 5/5 throughout.  No visible deformity.  Neurologic: CN 2-12 intact. Pain and light touch intact in extremities.  Symmetrical.  Speech is fluent. Motor exam as listed above. Psychiatric: Judgment intact, Mood & affect appropriate for pt's clinical situation. Dermatologic: No rashes or ulcers noted.  No changes consistent with cellulitis.   CBC Lab Results  Component Value Date   WBC 5.4 01/29/2022   HGB 15.3 01/29/2022   HCT 45.3 01/29/2022   MCV 88.3 01/29/2022   PLT 179 01/29/2022    BMET    Component Value Date/Time   NA 140 01/29/2022 1000   NA 141 01/28/2022 0950   K 3.7 01/29/2022 1000   CL 104 01/29/2022 1000   CO2 29 01/29/2022 1000   GLUCOSE 135 (H) 01/29/2022 1000   BUN 14 01/29/2022 1000   BUN 14 01/28/2022 0950   CREATININE 1.07 01/29/2022 1000   CALCIUM 9.1 01/29/2022 1000   GFRNONAA >60 01/29/2022 1000   GFRAA 96 04/18/2020 1026   CrCl cannot be calculated (Patient's most recent lab result is older than the maximum 21 days  allowed.).  COAG No results found for: "INR", "PROTIME"  Radiology MR Lumbar Spine Wo Contrast  Result Date: 03/03/2022 CLINICAL DATA:  Neurogenic claudication. Lumbar radiculopathy. Worsening chronic low back pain radiating down both legs. EXAM: MRI LUMBAR SPINE WITHOUT CONTRAST TECHNIQUE: Multiplanar, multisequence MR imaging of the lumbar spine was performed. No intravenous contrast was administered. COMPARISON:  Lumbar spine MRI 03/03/2014 FINDINGS: Segmentation:  Standard. Alignment: Mild straightening of the normal lumbar lordosis. No listhesis. Vertebrae: No fracture, suspicious marrow lesion, or significant marrow edema. Conus medullaris and cauda equina: Conus extends to the L1 level. Conus and cauda equina appear normal. Paraspinal and other soft tissues: Small left renal cysts measuring up to 1.3 cm. Disc levels: T12-L1 and L1-2: Negative. L2-3: Trace disc bulging and mild facet hypertrophy without stenosis, unchanged. L3-4: New disc desiccation and mild disc space narrowing. Disc bulging and mild facet and ligamentum flavum hypertrophy result in mild spinal stenosis, mild left greater than right lateral recess stenosis, and mild left neural foraminal stenosis, mildly progressed from prior. L4-5: Disc desiccation and mild disc space narrowing. Disc bulging and mild facet hypertrophy result in mild right lateral recess stenosis and mild left neural foraminal stenosis, stable to slightly progressed. New left foraminal annular fissure. L5-S1: Negative. IMPRESSION: 1. Mildly progressive disc degeneration at L3-4 with mild spinal, lateral recess, and left neural foraminal stenosis. 2. Mild lateral recess and neural foraminal stenosis at L4-5, stable to slightly progressed. Electronically Signed   By: Logan Bores M.D.   On: 03/03/2022 16:22     Assessment/Plan 1. Pain in both lower extremities Recommend:  The patient has atypical pain symptoms for vascular disease and on exam I do not find  evidence of vascular pathology that would explain the patient's symptoms.  Noninvasive studies do not identify significant vascular problems  I suspect the patient is c/o pseudoclaudication.  Patient should have an evaluation of the LS spine which I defer to the primary service or the Spine service.  The patient should continue walking and begin a more formal exercise program. The patient should continue his antiplatelet therapy and aggressive treatment of the lipid abnormalities.  Patient will follow-up with me on a PRN basis.   2. Lumbar degenerative disc disease Recommend:  The patient  has atypical pain symptoms for vascular disease and on exam I do not find evidence of vascular pathology that would explain the patient's symptoms.  Noninvasive studies do not identify significant vascular problems  I suspect the patient is c/o pseudoclaudication.  Patient should have an evaluation of the LS spine which I defer to the primary service or the Spine service.  The patient should continue walking and begin a more formal exercise program. The patient should continue his antiplatelet therapy and aggressive treatment of the lipid abnormalities.  Patient will follow-up with me on a PRN basis.   3. Primary hypertension Continue antihypertensive medications as already ordered, these medications have been reviewed and there are no changes at this time.   4. Arteriosclerosis of coronary artery Continue cardiac and antihypertensive medications as already ordered and reviewed, no changes at this time.  Continue statin as ordered and reviewed, no changes at this time  Nitrates PRN for chest pain      Hortencia Pilar, MD  03/13/2022 8:44 AM

## 2022-04-08 NOTE — Progress Notes (Unsigned)
Referring Physician:  Jerrol Crawford., MD [address] [place],   [address]  Primary Physician:  Shawn Crawford., MD  History of Present Illness: 04/08/2022 [name] is here today with a chief complaint of bilateral buttock pain/soreness that radiates to his thighs, knees, and sometimes his entire lower legs.  He has pain in his midline lower spine around his tailbone, but his most problematic symptoms are in his legs.  He has discomfort in his legs diffusely in both legs that occurs all the time though some days are worse than others.  It is most prominent in the afternoons, evenings, and nighttime.  Sometimes laying on his side makes it better.  He has tried Belbuca recently, which helps.  He is very frustrated with his inability to get some relief.    Bowel/Bladder Dysfunction: none  Conservative measures:  Physical therapy: has not participated recently; has participated in 2017 & 2018 Multimodal medical therapy including regular antiinflammatories: gabapentin, tramadol, nucynta, oxycodone, belbuca, ibuprofen, tylenol  Injections:  has not received epidural steroid injections  Past Surgery: denies  Shawn Crawford has no symptoms of cervical myelopathy.  The symptoms are causing a significant impact on the patient's life.   Review of Systems:  A 10 point review of systems is negative, except for the pertinent positives and negatives detailed in the HPI.  Past Medical History: Past Medical History:  Diagnosis Date   Actinic keratosis    Coronary artery disease    Dysplastic nevus 09/06/2014   Epigastric at mid line. Moderate to severe atypia, lateral margin involved. Excised 11/07/2014, margins free.   Dysplastic nevus 07/09/2015   Left lat. epigastric/costal margin. Moderate aypia, limited margins free.    Hx of cardiac catheterization    Hyperlipidemia    Hypertension     Past Surgical History: Past Surgical History:   Procedure Laterality Date   APPENDECTOMY     CARDIAC CATHETERIZATION     COLONOSCOPY WITH PROPOFOL N/A 06/24/2018   Procedure: COLONOSCOPY WITH PROPOFOL;  Surgeon: Shawn Manifold, MD;  Location: [facility];  Service: Endoscopy;  Laterality: N/A;   CORONARY ARTERY BYPASS GRAFT     x 3   ESOPHAGOGASTRODUODENOSCOPY (EGD) WITH PROPOFOL N/A 06/24/2018   Procedure: ESOPHAGOGASTRODUODENOSCOPY (EGD) WITH PROPOFOL;  Surgeon: Shawn Manifold, MD;  Location: [facility];  Service: Endoscopy;  Laterality: N/A;   EVALUATION UNDER ANESTHESIA WITH HEMORRHOIDECTOMY N/A 11/11/2018   Procedure: HEMORRHOIDAL LIGATION/PEXY, PARTIAL LATERAL SPINCTEROTOMY;  Surgeon: Shawn Boston, MD;  Location: [facility];  Service: General;  Laterality: N/A;   HERNIA REPAIR     INSERTION OF MESH  02/05/2022   Procedure: INSERTION OF MESH;  Surgeon: Shawn Bacon, MD;  Location: [facility];  Service: General;;    Allergies: Allergies as of 04/10/2022 - Review Complete 03/13/2022  Allergen Reaction Noted   Penicillins Itching, Rash, and Other (See Comments) 05/10/2015    Medications: No outpatient medications have been marked as taking for the 04/10/22 encounter (Appointment) with Shawn Maw, MD.    Social History: Social History   Tobacco Use   Smoking status: Never   Smokeless tobacco: Never  Vaping Use   Vaping Use: Never used  Substance Use Topics   Alcohol use: Yes    Alcohol/week: 0.0 standard drinks of alcohol    Comment: glass of wine occasionally   Drug use: No    Family Medical History: Family History  Problem Relation Age of Onset   Hyperlipidemia Mother  Diabetes Father    Hypertension Father    Thyroid cancer Sister    Colon cancer Sister     Physical Examination: There were no vitals filed for this visit.  General: Patient is well developed, well nourished, calm, collected, and in no apparent distress. Attention to examination is appropriate.  Psychiatric: Patient  is non-anxious.  Head:  Pupils equal, round, and reactive to light.  ENT:  Oral mucosa appears well hydrated.  Neck:   Supple.  Full range of motion.  Respiratory: Patient is breathing without any difficulty.  Extremities: No edema.  Vascular: Palpable dorsal pedal pulses.  Skin:   On exposed skin, there are no abnormal skin lesions.  NEUROLOGICAL:     Awake, alert, oriented to person, place, and time.  Speech is clear and fluent. Fund of knowledge is appropriate.   Cranial Nerves: Pupils equal round and reactive to light.  Facial tone is symmetric.  Facial sensation is symmetric. Shoulder shrug is symmetric. Tongue protrusion is midline.  There is no pronator drift.  ROM of spine: full.     Strength: Side Biceps Triceps Deltoid Interossei Grip Wrist Ext. Wrist Flex.  R '5 5 5 5 5 5 5  '$ L '5 5 5 5 5 5 5   '$ Side Iliopsoas Quads Hamstring PF DF EHL  R '5 5 5 5 5 5  '$ L '5 5 5 5 5 5   '$ Reflexes are 1+ and symmetric at the biceps, triceps, brachioradialis, patella and achilles.   Hoffman's is absent.  Clonus is not present.  Toes are down-going.  Bilateral upper and lower extremity sensation is intact to light touch.    No evidence of dysmetria noted.  Gait is normal.     Medical Decision Making  Imaging: MRI L spine 03/01/22 IMPRESSION: 1. Mildly progressive disc degeneration at L3-4 with mild spinal, lateral recess, and left neural foraminal stenosis. 2. Mild lateral recess and neural foraminal stenosis at L4-5, stable to slightly progressed.     Electronically Signed   By: Shawn Crawford M.D.   On: 03/03/2022 16:22  I have personally reviewed the images and agree with the above interpretation.  Assessment and Plan: [name] is a pleasant 61 y.o. male with low back pain with bilateral leg pain causing a chronic pain syndrome.  There is no obvious radiographic correlate for this.  Given the extent of symptoms and length of time he has had the symptoms, I think he may be  a good candidate for spinal cord stimulator evaluation.  I have given him some information that he can review and he will let me know if he would like to move in that direction.  I will then contact Dr. Zollie Crawford and set up a psychology evaluation and thoracic spine MRI scan.   I spent a total of 30 minutes in face-to-face and non-face-to-face activities related to this patient's care today.  Thank you for involving me in the care of this patient.   This note was partially dictated using voice recognition software, so please excuse any errors that were not corrected.   Ysenia Filice K. Izora Ribas MD, [facility]

## 2022-04-10 ENCOUNTER — Ambulatory Visit: Payer: 59 | Admitting: Neurosurgery

## 2022-04-10 ENCOUNTER — Encounter: Payer: Self-pay | Admitting: Neurosurgery

## 2022-04-10 VITALS — BP 138/80 | Ht 71.0 in | Wt 160.0 lb

## 2022-04-10 DIAGNOSIS — G894 Chronic pain syndrome: Secondary | ICD-10-CM

## 2022-04-10 NOTE — Patient Instructions (Signed)
Dr Rodenbough in [place] [phone] Dr Adam McDermott in [place] - also does televisits [phone] Dr Mount in [place] [phone] Dr Jeffrey Feldman in [place] - also does televisits [phone] [facility] (offices in , [place], and [place]) [phone] [facility] - televisits [phone] [facility] - televisits [phone]   Please let us know which one of the above psychologist's you would like to see for evaluation prior to the spinal cord stimulator trial. These are the only providers we are aware of that perform this type of evaluation. Once we fax the referral, you are required to call them to set up an appointment (they will not call you).  

## 2022-04-15 ENCOUNTER — Telehealth: Payer: Self-pay

## 2022-04-15 DIAGNOSIS — G894 Chronic pain syndrome: Secondary | ICD-10-CM

## 2022-04-15 NOTE — Telephone Encounter (Signed)
-----   Message from Conley Simmonds sent at 04/15/2022  2:56 PM EDT ----- Regarding: spinal stimulator implant This patient called to follow up on initial consult with Dr. Darreld Mclean for DOS  04/10/22. Patient stated Dr. Darreld Mclean recommended he receive a spinal stimulator implant. Patient has done his research and would like to proceed; requesting c/b with next steps. Please advise at [phone].

## 2022-04-15 NOTE — Telephone Encounter (Signed)
Referrals placed for MRI thoracic spine, psych eval, and to Dr Holley Raring for trial. Discussed with pt via mychart.

## 2022-04-22 ENCOUNTER — Ambulatory Visit: Payer: 59 | Admitting: Family Medicine

## 2022-04-29 ENCOUNTER — Ambulatory Visit
Payer: 59 | Attending: Student in an Organized Health Care Education/Training Program | Admitting: Student in an Organized Health Care Education/Training Program

## 2022-04-29 ENCOUNTER — Encounter: Payer: Self-pay | Admitting: Student in an Organized Health Care Education/Training Program

## 2022-04-29 VITALS — BP 129/85 | HR 84 | Temp 97.9°F | Resp 16 | Ht 71.0 in | Wt 155.0 lb

## 2022-04-29 DIAGNOSIS — G8929 Other chronic pain: Secondary | ICD-10-CM | POA: Insufficient documentation

## 2022-04-29 DIAGNOSIS — M5416 Radiculopathy, lumbar region: Secondary | ICD-10-CM | POA: Diagnosis not present

## 2022-04-29 DIAGNOSIS — M48061 Spinal stenosis, lumbar region without neurogenic claudication: Secondary | ICD-10-CM | POA: Diagnosis not present

## 2022-04-29 DIAGNOSIS — G894 Chronic pain syndrome: Secondary | ICD-10-CM | POA: Insufficient documentation

## 2022-04-29 DIAGNOSIS — M48062 Spinal stenosis, lumbar region with neurogenic claudication: Secondary | ICD-10-CM | POA: Insufficient documentation

## 2022-04-29 NOTE — Progress Notes (Signed)
PROVIDER NOTE: Information contained herein reflects review and annotations entered in association with encounter. Interpretation of such information and data should be left to medically-trained personnel. Information provided to patient can be located elsewhere in the medical record under "Patient Instructions". Document created using STT-dictation technology, any transcriptional errors that may result from process are unintentional.    Patient: Shawn Crawford  Service Category: E/M  Provider: Gillis Santa, MD  DOB: Jan 12, 1961  DOS: 04/29/2022  Referring Provider: Jerrol Banana.,*  MRN: [id number]  Specialty: Interventional Pain Management  PCP: Jerrol Banana., MD  Type: Established Patient  Setting: Ambulatory outpatient    Location: Office  Delivery: Face-to-face     HPI  Shawn Crawford, a 61 y.o. 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 Mr. Glazer primary complain today is Back Pain Last encounter: My last encounter with him was on 02/13/2022. Pertinent problems: Mr. Dalsanto has Arteriosclerosis of coronary artery; Chronic radicular lumbar pain; Benign essential HTN; Chronic pain syndrome; Chronic midline low back pain without sciatica; Lumbar facet arthropathy; Lumbar degenerative disc disease; and Piriformis muscle pain on their pertinent problem list. Pain Assessment: Severity of Chronic pain is reported as a 3 /10. Location: Back  /radiates through buttocks and legs bilat to knees. Onset: More than a month ago. Quality: Aching. Timing: Constant. Modifying factor(s): meds, laying on side with pillow between legs. Vitals:  height is $RemoveB'5\' 11"'gykXiWVm$  (1.803 m) and weight is 155 lb (70.3 kg). His temporal temperature is 97.9 F (36.6 C). His blood pressure is 129/85 and his pulse is 84. His respiration is 16 and oxygen saturation is 99%.   Reason for encounter: new problems.  Shawn Crawford presents today with increased low back pain with radiation  into bilateral buttocks and bilateral legs.  He had a recent MRI done which is below.  We reviewed this in detail.  He also met with Dr. Cari Caraway who recommended a spinal cord stimulator. He continues his medications as prescribed. He has not had any epidural steroid injections or physical therapy within the last year.  Pharmacotherapy Assessment  Analgesic: Belbuca 600 mcg BID, Tramadol 50 mg q8 hrs breakthrough pain    Monitoring: [place] PMP: PDMP reviewed during this encounter.       Pharmacotherapy: No side-effects or adverse reactions reported. Compliance: No problems identified. Effectiveness: Clinically acceptable.  Rise Patience, RN  04/29/2022  2:21 PM  Sign when Signing Visit Safety precautions to be maintained throughout the outpatient stay will include: orient to surroundings, keep bed in low position, maintain call bell within reach at all times, provide assistance with transfer out of bed and ambulation.   UDS:  Summary  Date Value Ref Range Status  08/27/2021 Note  Final    Comment:    ==================================================================== [employer] Select 13 (MW) ==================================================================== Test                             Result       Flag       Units  Drug Present and Declared for Prescription Verification   Buprenorphine                  13           EXPECTED   ng/mg creat   Norbuprenorphine               57  EXPECTED   ng/mg creat    Source of buprenorphine is a scheduled prescription medication.    Norbuprenorphine is an expected metabolite of buprenorphine.    Tramadol                       2960         EXPECTED   ng/mg creat   O-Desmethyltramadol            >3676        EXPECTED   ng/mg creat   N-Desmethyltramadol            485          EXPECTED   ng/mg creat    Source of tramadol is a prescription medication. O-desmethyltramadol    and N-desmethyltramadol are expected metabolites of  tramadol.  ==================================================================== Test                      Result    Flag   Units      Ref Range   Creatinine              136              mg/dL      >=20 ==================================================================== Declared Medications:  The flagging and interpretation on this report are based on the  following declared medications.  Unexpected results may arise from  inaccuracies in the declared medications.   **Note: The testing scope of this panel includes these medications:   Tramadol (Ultram)   **Note: The testing scope of this panel does not include small to  moderate amounts of these reported medications:   Buprenorphine (Belbuca)   **Note: The testing scope of this panel does not include the  following reported medications:   Amlodipine (Norvasc)  Aspirin  Desonide (Desowen)  Ketoconazole (Nizoral)  Ondansetron (Zofran)  Rosuvastatin (Crestor)  Tadalafil (Cialis)  Testosterone  Zolpidem (Ambien) ==================================================================== For clinical consultation, please call [phone]. ====================================================================      ROS  Constitutional: Denies any fever or chills Gastrointestinal: No reported hemesis, hematochezia, vomiting, or acute GI distress Musculoskeletal:  Low back pain with radiation to bilateral legs in a dermatomal fashion Neurological:  Paresthesias of bilateral legs  Medication Review  Buprenorphine HCl, Multiple Vitamin, amLODipine, aspirin EC, cyanocobalamin, desonide, fexofenadine, rosuvastatin, tadalafil, testosterone cypionate, traMADol, and zolpidem  History Review  Allergy: Mr. Mandley is allergic to penicillins. Drug: Mr. Meidinger  reports no history of drug use. Alcohol:  reports current alcohol use. Tobacco:  reports that he has never smoked. He has never used smokeless tobacco. Social: Mr. Rudnick  reports that he  has never smoked. He has never used smokeless tobacco. He reports current alcohol use. He reports that he does not use drugs. Medical:  has a past medical history of Actinic keratosis, Coronary artery disease, Dysplastic nevus (09/06/2014), Dysplastic nevus (07/09/2015), cardiac catheterization, Hyperlipidemia, and Hypertension. Surgical: Mr. Kemler  has a past surgical history that includes Coronary artery bypass graft; Hernia repair; Appendectomy; Cardiac catheterization; Colonoscopy with propofol (N/A, 06/24/2018); Esophagogastroduodenoscopy (egd) with propofol (N/A, 06/24/2018); Exam under anesthesia with hemorrhoidectomy (N/A, 11/11/2018); and Insertion of mesh (02/05/2022). Family: family history includes Colon cancer in his sister; Diabetes in his father; Hyperlipidemia in his mother; Hypertension in his father; Thyroid cancer in his sister.  Laboratory Chemistry Profile   Renal Lab Results  Component Value Date   BUN 14 01/29/2022   CREATININE 1.07 01/29/2022   BCR 13 01/28/2022  GFRAA 96 04/18/2020   GFRNONAA >60 01/29/2022    Hepatic Lab Results  Component Value Date   AST 23 01/28/2022   ALT 19 01/28/2022   ALBUMIN 4.5 01/28/2022   ALKPHOS 84 01/28/2022   LIPASE 23 06/14/2021    Electrolytes Lab Results  Component Value Date   NA 140 01/29/2022   K 3.7 01/29/2022   CL 104 01/29/2022   CALCIUM 9.1 01/29/2022   MG 2.0 01/28/2022   PHOS 2.5 06/15/2021    Bone Lab Results  Component Value Date   TESTOFREE 5.1 (L) 11/09/2019   TESTOSTERONE 263 (L) 08/16/2021    Inflammation (CRP: Acute Phase) (ESR: Chronic Phase) Lab Results  Component Value Date   ESRSEDRATE 2 01/28/2022         Note: Above Lab results reviewed.  Recent Imaging Review  VAS US AORTA/IVC/ILIACS ABDOMINAL AORTA STUDY  Patient Name:  SAHMIR WEATHERBEE  Date of Exam:   03/13/2022 Medical Rec #: [id number]      Accession #:    [id number] Date of Birth: 26-Sep-1961      Patient Gender: M Patient Age:   72  years Exam Location:  [facility] Procedure:      VAS US AORTA/IVC/ILIACS Referring Phys: Hortencia Pilar  --------------------------------------------------------------------------------   Indications: family hx    Performing Technologist: Concha Norway RVT    Examination Guidelines: A complete evaluation includes B-mode imaging, spectral Doppler, color Doppler, and power Doppler as needed of all accessible portions of each vessel. Bilateral testing is considered an integral part of a complete examination. Limited examinations for reoccurring indications may be performed as noted.    Abdominal Aorta Findings: +-------------+-------+----------+----------+---------+--------+--------+ Location     AP (cm)Trans (cm)PSV (cm/s)Waveform ThrombusComments +-------------+-------+----------+----------+---------+--------+--------+ Proximal     1.60   1.62      102                                 +-------------+-------+----------+----------+---------+--------+--------+ Mid          1.60   1.61      90                                  +-------------+-------+----------+----------+---------+--------+--------+ Distal       1.34   1.39      116                                 +-------------+-------+----------+----------+---------+--------+--------+ RT CIA Prox  1.1    1.1       81        biphasic                  +-------------+-------+----------+----------+---------+--------+--------+ RT CIA Mid                    99        triphasic                 +-------------+-------+----------+----------+---------+--------+--------+ RT CIA Distal                 109       triphasic                 +-------------+-------+----------+----------+---------+--------+--------+ RT EIA Prox  98        biphasic                  +-------------+-------+----------+----------+---------+--------+--------+ RT EIA Mid                     90        biphasic                  +-------------+-------+----------+----------+---------+--------+--------+ LT CIA Prox  0.9    0.9       126       triphasic                 +-------------+-------+----------+----------+---------+--------+--------+ LT CIA Mid                    138       triphasic                 +-------------+-------+----------+----------+---------+--------+--------+ LT CIA Distal                 103       triphasic                 +-------------+-------+----------+----------+---------+--------+--------+ LT EIA Prox                   67        triphasic                 +-------------+-------+----------+----------+---------+--------+--------+ LT EIA Mid                    107       triphasic                 +-------------+-------+----------+----------+---------+--------+--------+ LT EIA Distal                 93        triphasic                 +-------------+-------+----------+----------+---------+--------+--------+     Summary: Abdominal Aorta: No evidence of an abdominal aortic aneurysm was visualized. The largest aortic measurement is 1.6 cm. Moderate atherosclerosis in the abdominal aorta with mild atherosclerosis within in the bilateral iliac vessels.   *See table(s) above for measurements and observations.   Electronically signed by Hortencia Pilar MD on 03/13/2022 at 4:46:27 PM.       Final   VAS [place] ABI WITH/WO TBI  LOWER EXTREMITY DOPPLER STUDY  Patient Name:  NASHTON BELSON  Date of Exam:   03/13/2022 Medical Rec #: [id number]      Accession #:    [id number] Date of Birth: Apr 23, 1961      Patient Gender: M Patient Age:   56 years Exam Location:  [facility] Procedure:      VAS [place] ABI WITH/WO TBI Referring Phys:  --------------------------------------------------------------------------------   Indications: Rest pain.   Performing Technologist: Concha Norway RVT    Examination  Guidelines: A complete evaluation includes at minimum, Doppler waveform signals and systolic blood pressure reading at the level of bilateral brachial, anterior tibial, and posterior tibial arteries, when vessel segments are accessible. Bilateral testing is considered an integral part of a complete examination. Photoelectric Plethysmograph (PPG) waveforms and toe systolic pressure readings are included as required and additional duplex testing as needed. Limited examinations for reoccurring indications may be performed as noted.    ABI Findings: +---------+------------------+-----+---------+--------+ Right    Rt Pressure (mmHg)IndexWaveform Comment  +---------+------------------+-----+---------+--------+  Brachial 112                                      +---------+------------------+-----+---------+--------+ ATA      134               1.17 triphasic         +---------+------------------+-----+---------+--------+ PTA      143               1.24 triphasic         +---------+------------------+-----+---------+--------+ Great Toe119               1.03                   +---------+------------------+-----+---------+--------+  +---------+------------------+-----+---------+-------+ Left     Lt Pressure (mmHg)IndexWaveform Comment +---------+------------------+-----+---------+-------+ Brachial 115                                     +---------+------------------+-----+---------+-------+ ATA      128               1.11 triphasic        +---------+------------------+-----+---------+-------+ PTA      134               1.17 triphasic        +---------+------------------+-----+---------+-------+ Great Toe113               0.98 Normal           +---------+------------------+-----+---------+-------+    Summary: Right: Resting right ankle-brachial index is within normal range. No evidence of significant right lower extremity arterial  disease. The right toe-brachial index is normal.  Left: Resting left ankle-brachial index is within normal range. No evidence of significant left lower extremity arterial disease. The left toe-brachial index is normal.  *See table(s) above for measurements and observations.    Electronically signed by Hortencia Pilar MD on 03/13/2022 at 4:45:20 PM.      Final     CLINICAL DATA:  Neurogenic claudication. Lumbar radiculopathy. Worsening chronic low back pain radiating down both legs.   EXAM: MRI LUMBAR SPINE WITHOUT CONTRAST   TECHNIQUE: Multiplanar, multisequence MR imaging of the lumbar spine was performed. No intravenous contrast was administered.   COMPARISON:  Lumbar spine MRI 03/03/2014   FINDINGS: Segmentation:  Standard.   Alignment: Mild straightening of the normal lumbar lordosis. No listhesis.   Vertebrae: No fracture, suspicious marrow lesion, or significant marrow edema.   Conus medullaris and cauda equina: Conus extends to the L1 level. Conus and cauda equina appear normal.   Paraspinal and other soft tissues: Small left renal cysts measuring up to 1.3 cm.   Disc levels:   T12-L1 and L1-2: Negative.   L2-3: Trace disc bulging and mild facet hypertrophy without stenosis, unchanged.   L3-4: New disc desiccation and mild disc space narrowing. Disc bulging and mild facet and ligamentum flavum hypertrophy result in mild spinal stenosis, mild left greater than right lateral recess stenosis, and mild left neural foraminal stenosis, mildly progressed from prior.   L4-5: Disc desiccation and mild disc space narrowing. Disc bulging and mild facet hypertrophy result in mild right lateral recess stenosis and mild left neural foraminal stenosis, stable to slightly progressed. New left foraminal annular fissure.   L5-S1: Negative.   IMPRESSION: 1. Mildly progressive disc degeneration at  L3-4 with mild spinal, lateral recess, and left neural foraminal  stenosis. 2. Mild lateral recess and neural foraminal stenosis at L4-5, stable to slightly progressed.     Electronically Signed   By: Logan Bores M.D.   On: 03/03/2022 16:22 Lumbar MRI reviewed with patient in detail.  Note: Reviewed        Physical Exam  General appearance: Well nourished, well developed, and well hydrated. In no apparent acute distress Mental status: Alert, oriented x 3 (person, place, & time)       Respiratory: No evidence of acute respiratory distress Eyes: PERLA Vitals: BP 129/85   Pulse 84   Temp 97.9 F (36.6 C) (Temporal)   Resp 16   Ht $R'5\' 11"'Kx$  (1.803 m)   Wt 155 lb (70.3 kg)   SpO2 99%   BMI 21.62 kg/m  BMI: Estimated body mass index is 21.62 kg/m as calculated from the following:   Height as of this encounter: $RemoveBeforeD'5\' 11"'[id number]$  (1.803 m).   Weight as of this encounter: 155 lb (70.3 kg). Ideal: Ideal body weight: 75.3 kg (166 lb 0.1 oz)  Lumbar Spine Area Exam  Skin & Axial Inspection: No masses, redness, or swelling Alignment: Symmetrical Functional ROM: Unrestricted ROM       Stability: No instability detected Muscle Tone/Strength: Functionally intact. No obvious neuro-muscular anomalies detected. Sensory (Neurological): Dermatomal pain pattern and neurogenic  Gait & Posture Assessment  Ambulation: Unassisted Gait: Relatively normal for age and body habitus Posture: WNL  Lower Extremity Exam    Side: Right lower extremity  Side: Left lower extremity  Stability: No instability observed          Stability: No instability observed          Skin & Extremity Inspection: Skin color, temperature, and hair growth are WNL. No peripheral edema or cyanosis. No masses, redness, swelling, asymmetry, or associated skin lesions. No contractures.  Skin & Extremity Inspection: Skin color, temperature, and hair growth are WNL. No peripheral edema or cyanosis. No masses, redness, swelling, asymmetry, or associated skin lesions. No contractures.  Functional ROM:  Unrestricted ROM                  Functional ROM: Unrestricted ROM                  Muscle Tone/Strength: Functionally intact. No obvious neuro-muscular anomalies detected.  Muscle Tone/Strength: Functionally intact. No obvious neuro-muscular anomalies detected.  Sensory (Neurological): Neurogenic pain pattern        Sensory (Neurological): Neurogenic pain pattern        DTR: Patellar: deferred today Achilles: deferred today Plantar: deferred today  DTR: Patellar: deferred today Achilles: deferred today Plantar: deferred today  Palpation: No palpable anomalies  Palpation: No palpable anomalies    Assessment   Diagnosis Status  1. Neuroforaminal stenosis of lumbar spine   2. Chronic radicular lumbar pain   3. Spinal stenosis, lumbar region, with neurogenic claudication   4. Chronic pain syndrome    Persistent Persistent Persistent   Updated Problems: Problem  Chronic Radicular Lumbar Pain  Neuroforaminal Stenosis of Lumbar Spine  Spinal Stenosis, Lumbar Region, With Neurogenic Claudication    Plan of Care  Discussed options for her low back pain with radiation into bilateral legs.  Updated MRI shows L3-L4 disc desiccation mild to space narrowing along with disc bulge resulting in mild spinal canal stenosis left greater than right recess stenosis and progression of left neuroforaminal stenosis.  He also has L4-L5  disc bulge, mild facet hypertrophy and mild left neuroforaminal stenosis.  He has a new left foraminal annular fissure.  He states that his radiating leg pain is equal in both legs and one leg is not any worse than the other.  We had a lengthy discussion about spinal cord stimulation and what it entails.  He has not tried an epidural steroid injection in over 5 years.  He states that he had one in [place] over 3 years ago.  Given disease progression on lumbar MRI, recommend lumbar ESI.  If not effective can further consider spinal cord stimulation.  Patient will need  thoracic MRI and psych eval.  Patient endorsed understanding and is in agreement with plan.    Orders:  Orders Placed This Encounter  Procedures   Lumbar Epidural Injection    Standing Status:   Future    Standing Expiration Date:   05/30/2022    Scheduling Instructions:     L4/5 ESI    Order Specific Question:   Where will this procedure be performed?    Answer:   [facility]   Ambulatory referral to Physical Therapy    Referral Priority:   Routine    Referral Type:   Physical Medicine    Referral Reason:   Specialty Services Required    Requested Specialty:   Physical Therapy    Number of Visits Requested:   1   Follow-up plan:   Return in about 8 days (around 05/07/2022) for L4/5 ESI , in clinic [facility].    Recent Visits Date Type Provider Dept  02/13/22 Office Visit Gillis Santa, MD [facility]  Showing recent visits within past 90 days and meeting all other requirements Today's Visits Date Type Provider Dept  04/29/22 Office Visit Gillis Santa, MD [facility]  Showing today's visits and meeting all other requirements Future Appointments Date Type Provider Dept  05/15/22 Appointment Gillis Santa, MD [facility]  Showing future appointments within next 90 days and meeting all other requirements  I discussed the assessment and treatment plan with the patient. The patient was provided an opportunity to ask questions and all were answered. The patient agreed with the plan and demonstrated an understanding of the instructions.  Patient advised to call back or seek an in-person evaluation if the symptoms or condition worsens.  Duration of encounter: 8minutes.  Total time on encounter, as per AMA guidelines included both the face-to-face and non-face-to-face time personally spent by the physician and/or other qualified health care professional(s) on the day of the encounter (includes time in activities that require the physician or other  qualified health care professional and does not include time in activities normally performed by clinical staff). Physician's time may include the following activities when performed: preparing to see the patient (eg, review of tests, pre-charting review of records) obtaining and/or reviewing separately obtained history performing a medically appropriate examination and/or evaluation counseling and educating the patient/family/caregiver ordering medications, tests, or procedures referring and communicating with other health care professionals (when not separately reported) documenting clinical information in the electronic or other health record independently interpreting results (not separately reported) and communicating results to the patient/ family/caregiver care coordination (not separately reported)  Note by: Gillis Santa, MD Date: 04/29/2022; Time: 2:54 PM

## 2022-04-29 NOTE — Progress Notes (Signed)
Safety precautions to be maintained throughout the outpatient stay will include: orient to surroundings, keep bed in low position, maintain call bell within reach at all times, provide assistance with transfer out of bed and ambulation.  

## 2022-04-29 NOTE — Patient Instructions (Addendum)
You have been referred for physical therapy.  GENERAL RISKS AND COMPLICATIONS  What are the risk, side effects and possible complications? Generally speaking, most procedures are safe.  However, with any procedure there are risks, side effects, and the possibility of complications.  The risks and complications are dependent upon the sites that are lesioned, or the type of nerve block to be performed.  The closer the procedure is to the spine, the more serious the risks are.  Great care is taken when placing the radio frequency needles, block needles or lesioning probes, but sometimes complications can occur. Infection: Any time there is an injection through the skin, there is a risk of infection.  This is why sterile conditions are used for these blocks.  There are four possible types of infection. Localized skin infection. Central Nervous System Infection-This can be in the form of Meningitis, which can be deadly. Epidural Infections-This can be in the form of an epidural abscess, which can cause pressure inside of the spine, causing compression of the spinal cord with subsequent paralysis. This would require an emergency surgery to decompress, and there are no guarantees that the patient would recover from the paralysis. Discitis-This is an infection of the intervertebral discs.  It occurs in about 1% of discography procedures.  It is difficult to treat and it may lead to surgery.        2. Pain: the needles have to go through skin and soft tissues, will cause soreness.       3. Damage to internal structures:  The nerves to be lesioned may be near blood vessels or    other nerves which can be potentially damaged.       4. Bleeding: Bleeding is more common if the patient is taking blood thinners such as  aspirin, Coumadin, Ticiid, Plavix, etc., or if he/she have some genetic predisposition  such as hemophilia. Bleeding into the spinal canal can cause compression of the spinal  cord with subsequent  paralysis.  This would require an emergency surgery to  decompress and there are no guarantees that the patient would recover from the  paralysis.       5. Pneumothorax:  Puncturing of a lung is a possibility, every time a needle is introduced in  the area of the chest or upper back.  Pneumothorax refers to free air around the  collapsed lung(s), inside of the thoracic cavity (chest cavity).  Another two possible  complications related to a similar event would include: Hemothorax and Chylothorax.   These are variations of the Pneumothorax, where instead of air around the collapsed  lung(s), you may have blood or chyle, respectively.       6. Spinal headaches: They may occur with any procedures in the area of the spine.       7. Persistent CSF (Cerebro-Spinal Fluid) leakage: This is a rare problem, but may occur  with prolonged intrathecal or epidural catheters either due to the formation of a fistulous  track or a dural tear.       8. Nerve damage: By working so close to the spinal cord, there is always a possibility of  nerve damage, which could be as serious as a permanent spinal cord injury with  paralysis.       9. Death:  Although rare, severe deadly allergic reactions known as "Anaphylactic  reaction" can occur to any of the medications used.      10. Worsening of the symptoms:  We can always  make thing worse.  What are the chances of something like this happening? Chances of any of this occuring are extremely low.  By statistics, you have more of a chance of getting killed in a motor vehicle accident: while driving to the hospital than any of the above occurring .  Nevertheless, you should be aware that they are possibilities.  In general, it is similar to taking a shower.  Everybody knows that you can slip, hit your head and get killed.  Does that mean that you should not shower again?  Nevertheless always keep in mind that statistics do not mean anything if you happen to be on the wrong side of  them.  Even if a procedure has a 1 (one) in a 1,000,000 (million) chance of going wrong, it you happen to be that one..Also, keep in mind that by statistics, you have more of a chance of having something go wrong when taking medications.  Who should not have this procedure? If you are on a blood thinning medication (e.g. Coumadin, Plavix, see list of "Blood Thinners"), or if you have an active infection going on, you should not have the procedure.  If you are taking any blood thinners, please inform your physician.  How should I prepare for this procedure? Do not eat or drink anything at least six hours prior to the procedure. Bring a driver with you .  It cannot be a taxi. Come accompanied by an adult that can drive you back, and that is strong enough to help you if your legs get weak or numb from the local anesthetic. Take all of your medicines the morning of the procedure with just enough water to swallow them. If you have diabetes, make sure that you are scheduled to have your procedure done first thing in the morning, whenever possible. If you have diabetes, take only half of your insulin dose and notify our nurse that you have done so as soon as you arrive at the clinic. If you are diabetic, but only take blood sugar pills (oral hypoglycemic), then do not take them on the morning of your procedure.  You may take them after you have had the procedure. Do not take aspirin or any aspirin-containing medications, at least eleven (11) days prior to the procedure.  They may prolong bleeding. Wear loose fitting clothing that may be easy to take off and that you would not mind if it got stained with Betadine or blood. Do not wear any jewelry or perfume Remove any nail coloring.  It will interfere with some of our monitoring equipment.  NOTE: Remember that this is not meant to be interpreted as a complete list of all possible complications.  Unforeseen problems may occur.  BLOOD THINNERS The  following drugs contain aspirin or other products, which can cause increased bleeding during surgery and should not be taken for 2 weeks prior to and 1 week after surgery.  If you should need take something for relief of minor pain, you may take acetaminophen which is found in Tylenol,m Datril, Anacin-3 and Panadol. It is not blood thinner. The products listed below are.  Do not take any of the products listed below in addition to any listed on your instruction sheet.  A.P.C or A.P.C with Codeine Codeine Phosphate Capsules #3 Ibuprofen Ridaura  ABC compound Congesprin Imuran rimadil  Advil Cope Indocin Robaxisal  Alka-Seltzer Effervescent Pain Reliever and Antacid Coricidin or Coricidin-D  Indomethacin Rufen  Alka-Seltzer plus Cold Medicine Cosprin Ketoprofen S-A-C  Tablets  Anacin Analgesic Tablets or Capsules Coumadin Korlgesic Salflex  Anacin Extra Strength Analgesic tablets or capsules CP-2 Tablets Lanoril Salicylate  Anaprox Cuprimine Capsules Levenox Salocol  Anexsia-D Dalteparin Magan Salsalate  Anodynos Darvon compound Magnesium Salicylate Sine-off  Ansaid Dasin Capsules Magsal Sodium Salicylate  Anturane Depen Capsules Marnal Soma  APF Arthritis pain formula Dewitt's Pills Measurin Stanback  Argesic Dia-Gesic Meclofenamic Sulfinpyrazone  Arthritis Bayer Timed Release Aspirin Diclofenac Meclomen Sulindac  Arthritis pain formula Anacin Dicumarol Medipren Supac  Analgesic (Safety coated) Arthralgen Diffunasal Mefanamic Suprofen  Arthritis Strength Bufferin Dihydrocodeine Mepro Compound Suprol  Arthropan liquid Dopirydamole Methcarbomol with Aspirin Synalgos  ASA tablets/Enseals Disalcid Micrainin Tagament  Ascriptin Doan's Midol Talwin  Ascriptin A/D Dolene Mobidin Tanderil  Ascriptin Extra Strength Dolobid Moblgesic Ticlid  Ascriptin with Codeine Doloprin or Doloprin with Codeine Momentum Tolectin  Asperbuf Duoprin Mono-gesic Trendar  Aspergum Duradyne Motrin or Motrin IB Triminicin   Aspirin plain, buffered or enteric coated Durasal Myochrisine Trigesic  Aspirin Suppositories Easprin Nalfon Trillsate  Aspirin with Codeine Ecotrin Regular or Extra Strength Naprosyn Uracel  Atromid-S Efficin Naproxen Ursinus  Auranofin Capsules Elmiron Neocylate Vanquish  Axotal Emagrin Norgesic Verin  Azathioprine Empirin or Empirin with Codeine Normiflo Vitamin E  Azolid Emprazil Nuprin Voltaren  Bayer Aspirin plain, buffered or children's or timed BC Tablets or powders Encaprin Orgaran Warfarin Sodium  Buff-a-Comp Enoxaparin Orudis Zorpin  Buff-a-Comp with Codeine Equegesic Os-Cal-Gesic   Buffaprin Excedrin plain, buffered or Extra Strength Oxalid   Bufferin Arthritis Strength Feldene Oxphenbutazone   Bufferin plain or Extra Strength Feldene Capsules Oxycodone with Aspirin   Bufferin with Codeine Fenoprofen Fenoprofen Pabalate or Pabalate-SF   Buffets II Flogesic Panagesic   Buffinol plain or Extra Strength Florinal or Florinal with Codeine Panwarfarin   Buf-Tabs Flurbiprofen Penicillamine   Butalbital Compound Four-way cold tablets Penicillin   Butazolidin Fragmin Pepto-Bismol   Carbenicillin Geminisyn Percodan   Carna Arthritis Reliever Geopen Persantine   Carprofen Gold's salt Persistin   Chloramphenicol Goody's Phenylbutazone   Chloromycetin Haltrain Piroxlcam   Clmetidine heparin Plaquenil   Cllnoril Hyco-pap Ponstel   Clofibrate Hydroxy chloroquine Propoxyphen         Before stopping any of these medications, be sure to consult the physician who ordered them.  Some, such as Coumadin (Warfarin) are ordered to prevent or treat serious conditions such as "deep thrombosis", "pumonary embolisms", and other heart problems.  The amount of time that you may need off of the medication may also vary with the medication and the reason for which you were taking it.  If you are taking any of these medications, please make sure you notify your pain physician before you undergo any  procedures.         Epidural Steroid Injection Patient Information  Description: The epidural space surrounds the nerves as they exit the spinal cord.  In some patients, the nerves can be compressed and inflamed by a bulging disc or a tight spinal canal (spinal stenosis).  By injecting steroids into the epidural space, we can bring irritated nerves into direct contact with a potentially helpful medication.  These steroids act directly on the irritated nerves and can reduce swelling and inflammation which often leads to decreased pain.  Epidural steroids may be injected anywhere along the spine and from the neck to the low back depending upon the location of your pain.   After numbing the skin with local anesthetic (like Novocaine), a small needle is passed into the epidural space slowly.  You  may experience a sensation of pressure while this is being done.  The entire block usually last less than 10 minutes.  Conditions which may be treated by epidural steroids:  Low back and leg pain Neck and arm pain Spinal stenosis Post-laminectomy syndrome Herpes zoster (shingles) pain Pain from compression fractures  Preparation for the injection:  Do not eat any solid food or dairy products within 8 hours of your appointment.  You may drink clear liquids up to 3 hours before appointment.  Clear liquids include water, black coffee, juice or soda.  No milk or cream please. You may take your regular medication, including pain medications, with a sip of water before your appointment  Diabetics should hold regular insulin (if taken separately) and take 1/2 normal NPH dos the morning of the procedure.  Carry some sugar containing items with you to your appointment. A driver must accompany you and be prepared to drive you home after your procedure.  Bring all your current medications with your. An IV may be inserted and sedation may be given at the discretion of the physician.   A blood pressure cuff,  EKG and other monitors will often be applied during the procedure.  Some patients may need to have extra oxygen administered for a short period. You will be asked to provide medical information, including your allergies, prior to the procedure.  We must know immediately if you are taking blood thinners (like Coumadin/Warfarin)  Or if you are allergic to IV iodine contrast (dye). We must know if you could possible be pregnant.  Possible side-effects: Bleeding from needle site Infection (rare, may require surgery) Nerve injury (rare) Numbness & tingling (temporary) Difficulty urinating (rare, temporary) Spinal headache ( a headache worse with upright posture) Light -headedness (temporary) Pain at injection site (several days) Decreased blood pressure (temporary) Weakness in arm/leg (temporary) Pressure sensation in back/neck (temporary)  Call if you experience: Fever/chills associated with headache or increased back/neck pain. Headache worsened by an upright position. New onset weakness or numbness of an extremity below the injection site Hives or difficulty breathing (go to the emergency room) Inflammation or drainage at the infection site Severe back/neck pain Any new symptoms which are concerning to you  Please note:  Although the local anesthetic injected can often make your back or neck feel good for several hours after the injection, the pain will likely return.  It takes 3-7 days for steroids to work in the epidural space.  You may not notice any pain relief for at least that one week.  If effective, we will often do a series of three injections spaced 3-6 weeks apart to maximally decrease your pain.  After the initial series, we generally will wait several months before considering a repeat injection of the same type.  If you have any questions, please call [phone] [facility]

## 2022-05-09 ENCOUNTER — Other Ambulatory Visit: Payer: Self-pay | Admitting: Student in an Organized Health Care Education/Training Program

## 2022-05-09 ENCOUNTER — Other Ambulatory Visit: Payer: Self-pay | Admitting: Family Medicine

## 2022-05-09 DIAGNOSIS — G894 Chronic pain syndrome: Secondary | ICD-10-CM

## 2022-05-09 NOTE — Telephone Encounter (Signed)
Requested medication (s) are due for refill today: yes  Requested medication (s) are on the active medication list: yes  Last refill:  01/10/22 #30/4  Future visit scheduled: no  Notes to clinic:  Unable to refill per protocol, cannot delegate.      Requested Prescriptions  Pending Prescriptions Disp Refills   zolpidem (AMBIEN CR) 12.5 MG CR tablet [Pharmacy Med Name: ZOLPIDEM TARTRATE ER 12.5 MG TAB] 30 tablet     Sig: TAKE ONE TABLET BY MOUTH AT BEDTIME AS NEEDED FOR SLEEP     Not Delegated - Psychiatry:  Anxiolytics/Hypnotics Failed - 05/09/2022  4:16 PM      Failed - This refill cannot be delegated      Failed - Urine Drug Screen completed in last 360 days      Passed - Valid encounter within last 6 months    Recent Outpatient Visits           2 months ago Lumbar radiculopathy   [facility] Jerrol Banana., MD   3 months ago Pain in both lower extremities   [facility] Jerrol Banana., MD   4 months ago Right inguinal pain   [facility] Gwyneth Sprout, FNP   9 months ago Annual physical exam   [facility] Jerrol Banana., MD   1 year ago [place] Jerrol Banana., MD       Future Appointments             In 5 months Ralene Bathe, MD [place]

## 2022-05-12 ENCOUNTER — Other Ambulatory Visit: Payer: Self-pay

## 2022-05-12 ENCOUNTER — Ambulatory Visit (HOSPITAL_BASED_OUTPATIENT_CLINIC_OR_DEPARTMENT_OTHER): Payer: 59 | Admitting: Student in an Organized Health Care Education/Training Program

## 2022-05-12 ENCOUNTER — Ambulatory Visit
Admission: RE | Admit: 2022-05-12 | Discharge: 2022-05-12 | Disposition: A | Discharge status: Home / Self Care | Payer: 59 | Source: Ambulatory Visit | Attending: Neurosurgery | Admitting: Neurosurgery

## 2022-05-12 ENCOUNTER — Ambulatory Visit
Admission: RE | Admit: 2022-05-12 | Discharge: 2022-05-12 | Disposition: A | Discharge status: Home / Self Care | Payer: 59 | Source: Ambulatory Visit | Attending: Student in an Organized Health Care Education/Training Program | Admitting: Student in an Organized Health Care Education/Training Program

## 2022-05-12 ENCOUNTER — Encounter: Payer: Self-pay | Admitting: Student in an Organized Health Care Education/Training Program

## 2022-05-12 VITALS — BP 135/84 | HR 75 | Temp 97.0°F | Resp 14 | Ht 71.0 in | Wt 153.0 lb

## 2022-05-12 DIAGNOSIS — G894 Chronic pain syndrome: Secondary | ICD-10-CM

## 2022-05-12 DIAGNOSIS — M48062 Spinal stenosis, lumbar region with neurogenic claudication: Secondary | ICD-10-CM | POA: Insufficient documentation

## 2022-05-12 DIAGNOSIS — G8929 Other chronic pain: Secondary | ICD-10-CM | POA: Diagnosis not present

## 2022-05-12 DIAGNOSIS — M48061 Spinal stenosis, lumbar region without neurogenic claudication: Secondary | ICD-10-CM | POA: Diagnosis not present

## 2022-05-12 DIAGNOSIS — M5416 Radiculopathy, lumbar region: Secondary | ICD-10-CM

## 2022-05-12 MED ORDER — SODIUM CHLORIDE 0.9% FLUSH
2.0000 mL | Freq: Once | INTRAVENOUS | Status: AC
  Administered 2022-05-12: 2 mL

## 2022-05-12 MED ORDER — ROPIVACAINE HCL 2 MG/ML IJ SOLN
INTRAMUSCULAR | Status: AC
  Filled 2022-05-12: qty 20

## 2022-05-12 MED ORDER — LIDOCAINE HCL 2 % IJ SOLN
20.0000 mL | Freq: Once | INTRAMUSCULAR | Status: AC
  Administered 2022-05-12: 400 mg

## 2022-05-12 MED ORDER — ROPIVACAINE HCL 2 MG/ML IJ SOLN
2.0000 mL | Freq: Once | INTRAMUSCULAR | Status: AC
  Administered 2022-05-12: 2 mL via EPIDURAL

## 2022-05-12 MED ORDER — IOHEXOL 180 MG/ML  SOLN
10.0000 mL | Freq: Once | INTRAMUSCULAR | Status: AC
  Administered 2022-05-12: 10 mL via EPIDURAL

## 2022-05-12 MED ORDER — IOHEXOL 180 MG/ML  SOLN
INTRAMUSCULAR | Status: AC
  Filled 2022-05-12: qty 20

## 2022-05-12 MED ORDER — DEXAMETHASONE SODIUM PHOSPHATE 10 MG/ML IJ SOLN
INTRAMUSCULAR | Status: AC
  Filled 2022-05-12: qty 1

## 2022-05-12 MED ORDER — DEXAMETHASONE SODIUM PHOSPHATE 10 MG/ML IJ SOLN
10.0000 mg | Freq: Once | INTRAMUSCULAR | Status: AC
  Administered 2022-05-12: 10 mg

## 2022-05-12 MED ORDER — SODIUM CHLORIDE (PF) 0.9 % IJ SOLN
INTRAMUSCULAR | Status: AC
  Filled 2022-05-12: qty 10

## 2022-05-12 MED ORDER — LIDOCAINE HCL (PF) 2 % IJ SOLN
INTRAMUSCULAR | Status: AC
  Filled 2022-05-12: qty 10

## 2022-05-12 NOTE — Patient Instructions (Signed)

## 2022-05-12 NOTE — Progress Notes (Signed)
PROVIDER NOTE: Interpretation of information contained herein should be left to medically-trained personnel. Specific patient instructions are provided elsewhere under "Patient Instructions" section of medical record. This document was created in part using STT-dictation technology, any transcriptional errors that may result from this process are unintentional.  Patient: Shawn Crawford Type: Established DOB: Apr 14, 1961 MRN: [id number] PCP: Jerrol Banana., MD  Service: Procedure DOS: 05/12/2022 Setting: Ambulatory Location: Ambulatory outpatient facility Delivery: Face-to-face Provider: Gillis Santa, MD Specialty: Interventional Pain Management Specialty designation: 09 Location: Outpatient facility Ref. Prov.: Jerrol Banana.,*    Primary Reason for Visit: Interventional Pain Management Treatment. CC: Back Pain (low)   Procedure:           Type: Lumbar epidural steroid injection (LESI) (interlaminar) #1    Laterality: Midline   Level:  L4-5 Level.  Imaging: Fluoroscopic guidance         Anesthesia: Local anesthesia (1-2% Lidocaine) DOS: 05/12/2022  Performed by: Gillis Santa, MD  Purpose: Diagnostic/Therapeutic Indications: Lumbar radicular pain of intraspinal etiology of more than 4 weeks that has failed to respond to conservative therapy and is severe enough to impact quality of life or function. 1. Neuroforaminal stenosis of lumbar spine   2. Chronic radicular lumbar pain   3. Spinal stenosis, lumbar region, with neurogenic claudication   4. Lumbar radiculopathy   5. Chronic pain syndrome    NAS-11 Pain score:   Pre-procedure: 2 /10   Post-procedure: 1 /10      Position / Prep / Materials:  Position: Prone w/ head of the table raised (slight reverse trendelenburg) to facilitate breathing.  Prep solution: DuraPrep (Iodine Povacrylex [0.7% available iodine] and Isopropyl Alcohol, 74% w/w) Prep Area: Entire Posterior Lumbar Region from lower scapular tip down to  mid buttocks area and from flank to flank. Materials:  Tray: Epidural tray Needle(s):  Type: Epidural needle (Tuohy) Gauge (G):  22 Length: Regular (3.5-in) Qty: 1  Pre-op H&P Assessment:  Mr. Ribeiro is a 61 y.o. (year old), male patient, seen today for interventional treatment. He  has a past surgical history that includes Coronary artery bypass graft; Hernia repair; Appendectomy; Cardiac catheterization; Colonoscopy with propofol (N/A, 06/24/2018); Esophagogastroduodenoscopy (egd) with propofol (N/A, 06/24/2018); Exam under anesthesia with hemorrhoidectomy (N/A, 11/11/2018); and Insertion of mesh (02/05/2022). Mr. Belmar has a current medication list which includes the following prescription(s): amlodipine, aspirin ec, belbuca, desonide, fexofenadine, multiple vitamin, rosuvastatin, tadalafil, testosterone cypionate, tramadol, cyanocobalamin, and zolpidem. His primarily concern today is the Back Pain (low)  Initial Vital Signs:  Pulse/HCG Rate: 75ECG Heart Rate: 78 Temp:  (!) 97 F (36.1 C) Resp: 18 BP: 124/77 SpO2: 97 %  BMI: Estimated body mass index is 21.34 kg/m as calculated from the following:   Height as of this encounter: '5\' 11"'$  (1.803 m).   Weight as of this encounter: 153 lb (69.4 kg).  Risk Assessment: Allergies: Reviewed. He is allergic to penicillins.  Allergy Precautions: None required Coagulopathies: Reviewed. None identified.  Blood-thinner therapy: None at this time Active Infection(s): Reviewed. None identified. Mr. Schmuck is afebrile  Site Confirmation: Mr. Dendinger was asked to confirm the procedure and laterality before marking the site Procedure checklist: Completed Consent: Before the procedure and under the influence of no sedative(s), amnesic(s), or anxiolytics, the patient was informed of the treatment options, risks and possible complications. To fulfill our ethical and legal obligations, as recommended by the American Medical Association's Code of Ethics, I have  informed the patient of my clinical impression; the  nature and purpose of the treatment or procedure; the risks, benefits, and possible complications of the intervention; the alternatives, including doing nothing; the risk(s) and benefit(s) of the alternative treatment(s) or procedure(s); and the risk(s) and benefit(s) of doing nothing. The patient was provided information about the general risks and possible complications associated with the procedure. These may include, but are not limited to: failure to achieve desired goals, infection, bleeding, organ or nerve damage, allergic reactions, paralysis, and death. In addition, the patient was informed of those risks and complications associated to Spine-related procedures, such as failure to decrease pain; infection (i.e.: Meningitis, epidural or intraspinal abscess); bleeding (i.e.: epidural hematoma, subarachnoid hemorrhage, or any other type of intraspinal or peri-dural bleeding); organ or nerve damage (i.e.: Any type of peripheral nerve, nerve root, or spinal cord injury) with subsequent damage to sensory, motor, and/or autonomic systems, resulting in permanent pain, numbness, and/or weakness of one or several areas of the body; allergic reactions; (i.e.: anaphylactic reaction); and/or death. Furthermore, the patient was informed of those risks and complications associated with the medications. These include, but are not limited to: allergic reactions (i.e.: anaphylactic or anaphylactoid reaction(s)); adrenal axis suppression; blood sugar elevation that in diabetics may result in ketoacidosis or comma; water retention that in patients with history of congestive heart failure may result in shortness of breath, pulmonary edema, and decompensation with resultant heart failure; weight gain; swelling or edema; medication-induced neural toxicity; particulate matter embolism and blood vessel occlusion with resultant organ, and/or nervous system infarction; and/or  aseptic necrosis of one or more joints. Finally, the patient was informed that Medicine is not an exact science; therefore, there is also the possibility of unforeseen or unpredictable risks and/or possible complications that may result in a catastrophic outcome. The patient indicated having understood very clearly. We have given the patient no guarantees and we have made no promises. Enough time was given to the patient to ask questions, all of which were answered to the patient's satisfaction. Mr. Armijo has indicated that he wanted to continue with the procedure. Attestation: I, the ordering provider, attest that I have discussed with the patient the benefits, risks, side-effects, alternatives, likelihood of achieving goals, and potential problems during recovery for the procedure that I have provided informed consent. Date  Time: 05/12/2022  1:02 PM  Pre-Procedure Preparation:  Monitoring: As per clinic protocol. Respiration, ETCO2, SpO2, BP, heart rate and rhythm monitor placed and checked for adequate function Safety Precautions: Patient was assessed for positional comfort and pressure points before starting the procedure. Time-out: I initiated and conducted the "Time-out" before starting the procedure, as per protocol. The patient was asked to participate by confirming the accuracy of the "Time Out" information. Verification of the correct person, site, and procedure were performed and confirmed by me, the nursing staff, and the patient. "Time-out" conducted as per Joint Commission's Universal Protocol (UP.01.01.01). Time: 1331  Description/Narrative of Procedure:          Target: Epidural space via interlaminar opening, initially targeting the lower laminar border of the superior vertebral body. Region: Lumbar Approach: Percutaneous paravertebral  Rationale (medical necessity): procedure needed and proper for the diagnosis and/or treatment of the patient's medical symptoms and  needs. Procedural Technique Safety Precautions: Aspiration looking for blood return was conducted prior to all injections. At no point did we inject any substances, as a needle was being advanced. No attempts were made at seeking any paresthesias. Safe injection practices and needle disposal techniques used. Medications properly checked for  expiration dates. SDV (single dose vial) medications used. Description of the Procedure: Protocol guidelines were followed. The procedure needle was introduced through the skin, ipsilateral to the reported pain, and advanced to the target area. Bone was contacted and the needle walked caudad, until the lamina was cleared. The epidural space was identified using "loss-of-resistance technique" with 2-3 ml of PF-NaCl (0.9% NSS), in a 5cc LOR glass syringe.  6cc solution made of 3 cc of preservative-free saline, 2 cc of 0.2% ropivacaine, 1 cc of Decadron 10 mg/cc.   Vitals:   05/12/22 1326 05/12/22 1328 05/12/22 1332 05/12/22 1336  BP: 130/77 127/80 132/75 135/84  Pulse:      Resp: '11 13 12 14  '$ Temp:      TempSrc:      SpO2: 99% 97% 98% 98%  Weight:      Height:        Start Time: 1331 hrs. End Time: 1335 hrs.  Imaging Guidance (Spinal):          Type of Imaging Technique: Fluoroscopy Guidance (Spinal) Indication(s): Assistance in needle guidance and placement for procedures requiring needle placement in or near specific anatomical locations not easily accessible without such assistance. Exposure Time: Please see nurses notes. Contrast: Before injecting any contrast, we confirmed that the patient did not have an allergy to iodine, shellfish, or radiological contrast. Once satisfactory needle placement was completed at the desired level, radiological contrast was injected. Contrast injected under live fluoroscopy. No contrast complications. See chart for type and volume of contrast used. Fluoroscopic Guidance: I was personally present during the use of  fluoroscopy. "Tunnel Vision Technique" used to obtain the best possible view of the target area. Parallax error corrected before commencing the procedure. "Direction-depth-direction" technique used to introduce the needle under continuous pulsed fluoroscopy. Once target was reached, antero-posterior, oblique, and lateral fluoroscopic projection used confirm needle placement in all planes. Images permanently stored in EMR. Interpretation: I personally interpreted the imaging intraoperatively. Adequate needle placement confirmed in multiple planes. Appropriate spread of contrast into desired area was observed. No evidence of afferent or efferent intravascular uptake. No intrathecal or subarachnoid spread observed. Permanent images saved into the patient's record.  Antibiotic Prophylaxis:   Anti-infectives (From admission, onward)    None      Indication(s): None identified  Post-operative Assessment:  Post-procedure Vital Signs:  Pulse/HCG Rate: 7577 Temp:  (!) 97 F (36.1 C) Resp: 14 BP: 135/84 SpO2: 98 %  EBL: None  Complications: No immediate post-treatment complications observed by team, or reported by patient.  Note: The patient tolerated the entire procedure well. A repeat set of vitals were taken after the procedure and the patient was kept under observation following institutional policy, for this type of procedure. Post-procedural neurological assessment was performed, showing return to baseline, prior to discharge. The patient was provided with post-procedure discharge instructions, including a section on how to identify potential problems. Should any problems arise concerning this procedure, the patient was given instructions to immediately contact us, at any time, without hesitation. In any case, we plan to contact the patient by telephone for a follow-up status report regarding this interventional procedure.  Comments:  No additional relevant information.  5 out of 5  strength bilateral lower extremity: Plantar flexion, dorsiflexion, knee flexion, knee extension.   Plan of Care  Orders:  Orders Placed This Encounter  Procedures   DG PAIN CLINIC C-ARM 1-60 MIN NO REPORT    Intraoperative interpretation by procedural physician at [place].  Standing Status:   Standing    Number of Occurrences:   1    Order Specific Question:   Reason for exam:    Answer:   Assistance in needle guidance and placement for procedures requiring needle placement in or near specific anatomical locations not easily accessible without such assistance.   Chronic Opioid Analgesic:  Belbuca 600 mcg BID, Tramadol 50 mg q8 hrs breakthrough pain    Medications ordered for procedure: Meds ordered this encounter  Medications   iohexol (OMNIPAQUE) 180 MG/ML injection 10 mL    Must be Myelogram-compatible. If not available, you may substitute with a water-soluble, non-ionic, hypoallergenic, myelogram-compatible radiological contrast medium.   lidocaine (XYLOCAINE) 2 % (with pres) injection 400 mg   sodium chloride flush (NS) 0.9 % injection 2 mL   ropivacaine (PF) 2 mg/mL (0.2%) (NAROPIN) injection 2 mL   dexamethasone (DECADRON) injection 10 mg   Medications administered: We administered iohexol, lidocaine, sodium chloride flush, ropivacaine (PF) 2 mg/mL (0.2%), and dexamethasone.  See the medical record for exact dosing, route, and time of administration.  Follow-up plan:   Return cancel upcoming med management in 2 weeks and combine with f/up in person.      Recent Visits Date Type Provider Dept  04/29/22 Office Visit Gillis Santa, MD [facility]  02/13/22 Office Visit Gillis Santa, MD [facility]  Showing recent visits within past 90 days and meeting all other requirements Today's Visits Date Type Provider Dept  05/12/22 Procedure visit Gillis Santa, MD [facility]  Showing today's visits and meeting all other  requirements Future Appointments Date Type Provider Dept  06/03/22 Appointment Gillis Santa, MD [facility]  Showing future appointments within next 90 days and meeting all other requirements  Disposition: Discharge home  Discharge (Date  Time): 05/12/2022; 1340 hrs.   Primary Care Physician: Jerrol Banana., MD Location: [facility] Note by: Gillis Santa, MD Date: 05/12/2022; Time: 2:34 PM  Disclaimer:  Medicine is not an exact science. The only guarantee in medicine is that nothing is guaranteed. It is important to note that the decision to proceed with this intervention was based on the information collected from the patient. The Data and conclusions were drawn from the patient's questionnaire, the interview, and the physical examination. Because the information was provided in large part by the patient, it cannot be guaranteed that it has not been purposely or unconsciously manipulated. Every effort has been made to obtain as much relevant data as possible for this evaluation. It is important to note that the conclusions that lead to this procedure are derived in large part from the available data. Always take into account that the treatment will also be dependent on availability of resources and existing treatment guidelines, considered by other Pain Management Practitioners as being common knowledge and practice, at the time of the intervention. For Medico-Legal purposes, it is also important to point out that variation in procedural techniques and pharmacological choices are the acceptable norm. The indications, contraindications, technique, and results of the above procedure should only be interpreted and judged by a Board-Certified Interventional Pain Specialist with extensive familiarity and expertise in the same exact procedure and technique.

## 2022-05-13 ENCOUNTER — Telehealth: Payer: Self-pay | Admitting: *Deleted

## 2022-05-13 NOTE — Telephone Encounter (Signed)
Attempted to call for post procedure follow-up. Message left. 

## 2022-05-15 ENCOUNTER — Encounter: Payer: 59 | Admitting: Student in an Organized Health Care Education/Training Program

## 2022-05-19 ENCOUNTER — Telehealth: Payer: Self-pay

## 2022-05-19 NOTE — Telephone Encounter (Signed)
Received email last week for [facility] regarding the patients pysc eval. They have been trying to reach out to him to schedule. I called the patient and left a vm giving him their phone number and asked him to call to schedule appt. I received an email today stating they have reached out 3 time and have closed the referral.

## 2022-05-19 NOTE — Telephone Encounter (Signed)
I just received the pysc eval from a different provider. Made copy for Dr. Holley Raring

## 2022-06-03 ENCOUNTER — Encounter: Payer: Self-pay | Admitting: Student in an Organized Health Care Education/Training Program

## 2022-06-03 ENCOUNTER — Ambulatory Visit
Payer: 59 | Attending: Student in an Organized Health Care Education/Training Program | Admitting: Student in an Organized Health Care Education/Training Program

## 2022-06-03 VITALS — BP 134/81 | HR 85 | Temp 97.3°F | Resp 16 | Ht 71.0 in | Wt 156.0 lb

## 2022-06-03 DIAGNOSIS — G8929 Other chronic pain: Secondary | ICD-10-CM | POA: Diagnosis present

## 2022-06-03 DIAGNOSIS — M48062 Spinal stenosis, lumbar region with neurogenic claudication: Secondary | ICD-10-CM | POA: Diagnosis not present

## 2022-06-03 DIAGNOSIS — G894 Chronic pain syndrome: Secondary | ICD-10-CM | POA: Diagnosis not present

## 2022-06-03 DIAGNOSIS — M48061 Spinal stenosis, lumbar region without neurogenic claudication: Secondary | ICD-10-CM | POA: Insufficient documentation

## 2022-06-03 DIAGNOSIS — M5416 Radiculopathy, lumbar region: Secondary | ICD-10-CM | POA: Insufficient documentation

## 2022-06-03 MED ORDER — TRAMADOL HCL 50 MG PO TABS: 50.0000 mg | ORAL_TABLET | Freq: Three times a day (TID) | ORAL | 2 refills | Status: DC | PRN

## 2022-06-03 MED ORDER — BELBUCA 600 MCG BU FILM: 1.0000 | ORAL_FILM | Freq: Two times a day (BID) | BUCCAL | 2 refills | Status: DC

## 2022-06-03 NOTE — Progress Notes (Signed)
Nursing Pain Medication Assessment:  Safety precautions to be maintained throughout the outpatient stay will include: orient to surroundings, keep bed in low position, maintain call bell within reach at all times, provide assistance with transfer out of bed and ambulation.  Medication Inspection Compliance: Pill count conducted under aseptic conditions, in front of the patient. Neither the pills nor the bottle was removed from the patient's sight at any time. Once count was completed pills were immediately returned to the patient in their original bottle.  Medication #1: Buprenorphine (Suboxone) Pill/Patch Count:  17 of 60 patches remain Pill/Patch Appearance: Markings consistent with prescribed medication Bottle Appearance: Standard pharmacy container. Clearly labeled. Filled Date: 8 / 41 / 2023 Last Medication intake:  Today  Medication #2: Tramadol (Ultram) Pill/Patch Count:  45 of 90 pills remain Pill/Patch Appearance: Markings consistent with prescribed medication Bottle Appearance: Standard pharmacy container. Clearly labeled. Filled Date: 8 / 65 / 2023 Last Medication intake:  yesterday

## 2022-06-03 NOTE — Progress Notes (Signed)
PROVIDER NOTE: Information contained herein reflects review and annotations entered in association with encounter. Interpretation of such information and data should be left to medically-trained personnel. Information provided to patient can be located elsewhere in the medical record under "Patient Instructions". Document created using STT-dictation technology, any transcriptional errors that may result from process are unintentional.    Patient: Shawn Crawford  Service Category: E/M  Provider: Gillis Santa, MD  DOB: 1961-04-28  DOS: 06/03/2022  Referring Provider: Jerrol Banana.,*  MRN: [id number]  Specialty: Interventional Pain Management  PCP: Jerrol Banana., MD  Type: Established Patient  Setting: Ambulatory outpatient    Location: Office  Delivery: Face-to-face     HPI  Shawn Crawford, a 61 y.o. 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 Shawn Crawford primary complain today is Back Pain Last encounter: My last encounter with him was on 05/12/2022. Pertinent problems: Shawn Crawford has Arteriosclerosis of coronary artery; Chronic radicular lumbar pain; Benign essential HTN; Chronic pain syndrome; Chronic midline low back pain without sciatica; Lumbar facet arthropathy; Lumbar degenerative disc disease; and Piriformis muscle pain on their pertinent problem list. Pain Assessment: Severity of Chronic pain is reported as a 2 /10. Location: Back Lower/buttocks bilaterally and anterior and posterior legs to knees. Onset: More than a month ago. Quality: Aching. Timing: Constant. Modifying factor(s): meds, positioning. Vitals:  height is $RemoveB'5\' 11"'[id number]$  (1.803 m) and weight is 156 lb (70.8 kg). His temporal temperature is 97.3 F (36.3 C) (abnormal). His blood pressure is 134/81 and his pulse is 85. His respiration is 16 and oxygen saturation is 98%.   Reason for encounter: both, medication management and post-procedure evaluation and assessment.     Post-procedure evaluation   Type: Lumbar epidural steroid injection (LESI) (interlaminar) #1    Laterality: Midline   Level:  L4-5 Level.  Imaging: Fluoroscopic guidance         Anesthesia: Local anesthesia (1-2% Lidocaine) DOS: 05/12/2022  Performed by: Gillis Santa, MD  Purpose: Diagnostic/Therapeutic Indications: Lumbar radicular pain of intraspinal etiology of more than 4 weeks that has failed to respond to conservative therapy and is severe enough to impact quality of life or function. 1. Neuroforaminal stenosis of lumbar spine   2. Chronic radicular lumbar pain   3. Spinal stenosis, lumbar region, with neurogenic claudication   4. Lumbar radiculopathy   5. Chronic pain syndrome    NAS-11 Pain score:   Pre-procedure: 2 /10   Post-procedure: 1 /10      Effectiveness:  Initial hour after procedure: 90 %  Subsequent 4-6 hours post-procedure: 90 %  Analgesia past initial 6 hours: 90 % (X 18 days) improvement in leg pain Ongoing improvement:  Analgesic:  <50%, states that pain is returning. Utilizing less prn tramadol  Function: Somewhat improved ROM: Somewhat improved   Pharmacotherapy Assessment  Analgesic: Belbuca 600 mcg BID, Tramadol 50 mg q8 hrs breakthrough pain    Monitoring: [place] PMP: PDMP reviewed during this encounter.       Pharmacotherapy: No side-effects or adverse reactions reported. Compliance: No problems identified. Effectiveness: Clinically acceptable.  Rise Patience, RN  06/03/2022  8:13 AM  Sign when Signing Visit Nursing Pain Medication Assessment:  Safety precautions to be maintained throughout the outpatient stay will include: orient to surroundings, keep bed in low position, maintain call bell within reach at all times, provide assistance with transfer out of bed and ambulation.  Medication Inspection Compliance: Pill count conducted  under aseptic conditions, in front of the patient. Neither the pills nor the bottle was removed from the  patient's sight at any time. Once count was completed pills were immediately returned to the patient in their original bottle.  Medication #1: Buprenorphine (Suboxone) Pill/Patch Count:  17 of 60 patches remain Pill/Patch Appearance: Markings consistent with prescribed medication Bottle Appearance: Standard pharmacy container. Clearly labeled. Filled Date: 8 / 47 / 2023 Last Medication intake:  Today  Medication #2: Tramadol (Ultram) Pill/Patch Count:  45 of 90 pills remain Pill/Patch Appearance: Markings consistent with prescribed medication Bottle Appearance: Standard pharmacy container. Clearly labeled. Filled Date: 8 / 67 / 2023 Last Medication intake:  yesterday    No results found for: "CBDTHCR" No results found for: "D8THCCBX" No results found for: "D9THCCBX"  UDS:  Summary  Date Value Ref Range Status  08/27/2021 Note  Final    Comment:    ==================================================================== [employer] Select 13 (MW) ==================================================================== Test                             Result       Flag       Units  Drug Present and Declared for Prescription Verification   Buprenorphine                  13           EXPECTED   ng/mg creat   Norbuprenorphine               57           EXPECTED   ng/mg creat    Source of buprenorphine is a scheduled prescription medication.    Norbuprenorphine is an expected metabolite of buprenorphine.    Tramadol                       2960         EXPECTED   ng/mg creat   O-Desmethyltramadol            >3676        EXPECTED   ng/mg creat   N-Desmethyltramadol            485          EXPECTED   ng/mg creat    Source of tramadol is a prescription medication. O-desmethyltramadol    and N-desmethyltramadol are expected metabolites of tramadol.  ==================================================================== Test                      Result    Flag   Units      Ref Range   Creatinine               136              mg/dL      >=20 ==================================================================== Declared Medications:  The flagging and interpretation on this report are based on the  following declared medications.  Unexpected results may arise from  inaccuracies in the declared medications.   **Note: The testing scope of this panel includes these medications:   Tramadol (Ultram)   **Note: The testing scope of this panel does not include small to  moderate amounts of these reported medications:   Buprenorphine (Belbuca)   **Note: The testing scope of this panel does not include the  following reported medications:   Amlodipine (Norvasc)  Aspirin  Desonide (Desowen)  Ketoconazole (Nizoral)  Ondansetron (Zofran)  Rosuvastatin (Crestor)  Tadalafil (Cialis)  Testosterone  Zolpidem (Ambien) ==================================================================== For clinical consultation, please call [phone]. ====================================================================       ROS  Constitutional: Denies any fever or chills Gastrointestinal: No reported hemesis, hematochezia, vomiting, or acute GI distress Musculoskeletal:  LBP + B/L leg pain Neurological: No reported episodes of acute onset apraxia, aphasia, dysarthria, agnosia, amnesia, paralysis, loss of coordination, or loss of consciousness  Medication Review  Buprenorphine HCl, Multiple Vitamin, amLODipine, aspirin EC, cyanocobalamin, desonide, fexofenadine, rosuvastatin, tadalafil, testosterone cypionate, traMADol, and zolpidem  History Review  Allergy: Shawn Crawford is allergic to penicillins. Drug: Shawn Crawford  reports no history of drug use. Alcohol:  reports current alcohol use. Tobacco:  reports that he has never smoked. He has never used smokeless tobacco. Social: Shawn Crawford  reports that he has never smoked. He has never used smokeless tobacco. He reports current alcohol use. He reports that  he does not use drugs. Medical:  has a past medical history of Actinic keratosis, Coronary artery disease, Dysplastic nevus (09/06/2014), Dysplastic nevus (07/09/2015), cardiac catheterization, Hyperlipidemia, and Hypertension. Surgical: Shawn Crawford  has a past surgical history that includes Coronary artery bypass graft; Hernia repair; Appendectomy; Cardiac catheterization; Colonoscopy with propofol (N/A, 06/24/2018); Esophagogastroduodenoscopy (egd) with propofol (N/A, 06/24/2018); Exam under anesthesia with hemorrhoidectomy (N/A, 11/11/2018); and Insertion of mesh (02/05/2022). Family: family history includes Colon cancer in his sister; Diabetes in his father; Hyperlipidemia in his mother; Hypertension in his father; Thyroid cancer in his sister.  Laboratory Chemistry Profile   Renal Lab Results  Component Value Date   BUN 14 01/29/2022   CREATININE 1.07 01/29/2022   BCR 13 01/28/2022   GFRAA 96 04/18/2020   GFRNONAA >60 01/29/2022    Hepatic Lab Results  Component Value Date   AST 23 01/28/2022   ALT 19 01/28/2022   ALBUMIN 4.5 01/28/2022   ALKPHOS 84 01/28/2022   LIPASE 23 06/14/2021    Electrolytes Lab Results  Component Value Date   NA 140 01/29/2022   K 3.7 01/29/2022   CL 104 01/29/2022   CALCIUM 9.1 01/29/2022   MG 2.0 01/28/2022   PHOS 2.5 06/15/2021    Bone Lab Results  Component Value Date   TESTOFREE 5.1 (L) 11/09/2019   TESTOSTERONE 263 (L) 08/16/2021    Inflammation (CRP: Acute Phase) (ESR: Chronic Phase) Lab Results  Component Value Date   ESRSEDRATE 2 01/28/2022         Note: Above Lab results reviewed.  Recent Imaging Review  MR THORACIC SPINE WO CONTRAST CLINICAL DATA:  evaluate for stenosis prior to spinal cord stimulator  EXAM: MRI THORACIC SPINE WITHOUT CONTRAST  TECHNIQUE: Multiplanar, multisequence MR imaging of the thoracic spine was performed. No intravenous contrast was administered.  COMPARISON:  None Available.  FINDINGS: Alignment:  Mildly exaggerated kyphosis. No substantial sagittal subluxation.  Vertebrae: Vertebral body heights are maintained. No focal marrow edema to suggest acute fracture or discitis/osteomyelitis. No suspicious bone lesions.  Cord:  Normal cord signal.  Paraspinal and other soft tissues: Unremarkable.  Disc levels:  Mild multilevel degenerative change without significant canal or foraminal stenosis.  IMPRESSION: No significant stenosis.  Electronically Signed   By: Margaretha Sheffield M.D.   On: 05/13/2022 15:38   CLINICAL DATA:  Neurogenic claudication. Lumbar radiculopathy. Worsening chronic low back pain radiating down both legs.   EXAM: MRI LUMBAR SPINE WITHOUT CONTRAST   TECHNIQUE: Multiplanar, multisequence MR imaging of the lumbar spine was performed. No intravenous contrast was administered.  COMPARISON:  Lumbar spine MRI 03/03/2014   FINDINGS: Segmentation:  Standard.   Alignment: Mild straightening of the normal lumbar lordosis. No listhesis.   Vertebrae: No fracture, suspicious marrow lesion, or significant marrow edema.   Conus medullaris and cauda equina: Conus extends to the L1 level. Conus and cauda equina appear normal.   Paraspinal and other soft tissues: Small left renal cysts measuring up to 1.3 cm.   Disc levels:   T12-L1 and L1-2: Negative.   L2-3: Trace disc bulging and mild facet hypertrophy without stenosis, unchanged.   L3-4: New disc desiccation and mild disc space narrowing. Disc bulging and mild facet and ligamentum flavum hypertrophy result in mild spinal stenosis, mild left greater than right lateral recess stenosis, and mild left neural foraminal stenosis, mildly progressed from prior.   L4-5: Disc desiccation and mild disc space narrowing. Disc bulging and mild facet hypertrophy result in mild right lateral recess stenosis and mild left neural foraminal stenosis, stable to slightly progressed. New left foraminal annular  fissure.   L5-S1: Negative.   IMPRESSION: 1. Mildly progressive disc degeneration at L3-4 with mild spinal, lateral recess, and left neural foraminal stenosis. 2. Mild lateral recess and neural foraminal stenosis at L4-5, stable to slightly progressed.     Electronically Signed   By: Sebastian Ache M.D.   On: 03/03/2022 16:22 Lumbar MRI reviewed with patient in detail.    Note: Reviewed      thoracic MRI also reviewed.  No evidence of thoracic canal stenosis to preclude lead placement for SCS trial.  Physical Exam  General appearance: Well nourished, well developed, and well hydrated. In no apparent acute distress Mental status: Alert, oriented x 3 (person, place, & time)       Respiratory: No evidence of acute respiratory distress Eyes: PERLA Vitals: BP 134/81   Pulse 85   Temp (!) 97.3 F (36.3 C) (Temporal)   Resp 16   Ht 5\' 11"  (1.803 m)   Wt 156 lb (70.8 kg)   SpO2 98%   BMI 21.76 kg/m  BMI: Estimated body mass index is 21.76 kg/m as calculated from the following:   Height as of this encounter: 5\' 11"  (1.803 m).   Weight as of this encounter: 156 lb (70.8 kg). Ideal: Ideal body weight: 75.3 kg (166 lb 0.1 oz)  Lumbar Spine Area Exam  Skin & Axial Inspection: No masses, redness, or swelling Alignment: Symmetrical Functional ROM: Unrestricted ROM       Stability: No instability detected Muscle Tone/Strength: Functionally intact. No obvious neuro-muscular anomalies detected. Sensory (Neurological): Dermatomal pain pattern and neurogenic   Gait & Posture Assessment  Ambulation: Unassisted Gait: Relatively normal for age and body habitus Posture: WNL  Lower Extremity Exam      Side: Right lower extremity   Side: Left lower extremity  Stability: No instability observed           Stability: No instability observed          Skin & Extremity Inspection: Skin color, temperature, and hair growth are WNL. No peripheral edema or cyanosis. No masses, redness, swelling,  asymmetry, or associated skin lesions. No contractures.   Skin & Extremity Inspection: Skin color, temperature, and hair growth are WNL. No peripheral edema or cyanosis. No masses, redness, swelling, asymmetry, or associated skin lesions. No contractures.  Functional ROM: Unrestricted ROM                   Functional ROM: Unrestricted ROM  Muscle Tone/Strength: Functionally intact. No obvious neuro-muscular anomalies detected.   Muscle Tone/Strength: Functionally intact. No obvious neuro-muscular anomalies detected.  Sensory (Neurological): Neurogenic pain pattern         Sensory (Neurological): Neurogenic pain pattern        DTR: Patellar: deferred today Achilles: deferred today Plantar: deferred today   DTR: Patellar: deferred today Achilles: deferred today Plantar: deferred today  Palpation: No palpable anomalies   Palpation: No palpable anomalies     Assessment   Diagnosis Status  1. Neuroforaminal stenosis of lumbar spine   2. Chronic radicular lumbar pain   3. Spinal stenosis, lumbar region, with neurogenic claudication   4. Lumbar radiculopathy   5. Chronic pain syndrome    Responding Responding Responding   Updated Problems: No problems updated.  Plan of Care    Shawn Crawford has a current medication list which includes the following long-term medication(s): amlodipine, rosuvastatin, tadalafil, testosterone cypionate, zolpidem, and [START ON 06/07/2022] tramadol.  Pharmacotherapy (Medications Ordered): Meds ordered this encounter  Medications   Buprenorphine HCl (BELBUCA) 600 MCG FILM    Sig: Place 1 Film inside cheek every 12 (twelve) hours.    Dispense:  60 each    Refill:  2   traMADol (ULTRAM) 50 MG tablet    Sig: Take 1 tablet (50 mg total) by mouth every 8 (eight) hours as needed for severe pain. Month last 30 days.    Dispense:  90 tablet    Refill:  2    Shawn Crawford - Not applicable. Fill one day early if pharmacy is closed on scheduled  refill date. .   UDS up-to-date and appropriate Review thoracic MRI which is unremarkable for thoracic canal stenosis, consider SCS trial Repeat lumbar epidural steroid injection #2  Orders:  Orders Placed This Encounter  Procedures   Lumbar Epidural Injection    Standing Status:   Future    Standing Expiration Date:   09/02/2022    Scheduling Instructions:     Procedure: Interlaminar Lumbar Epidural Steroid injection (LESI)            Laterality: Midline     Sedation: without     Timeframe: ASAA    Order Specific Question:   Where will this procedure be performed?    Answer:   [facility]   Follow-up plan:   Return in about 2 weeks (around 06/17/2022) for L-ESI #2, in clinic [facility].    Recent Visits Date Type Provider Dept  05/12/22 Procedure visit Gillis Santa, MD [facility]  04/29/22 Office Visit Gillis Santa, MD [facility]  Showing recent visits within past 90 days and meeting all other requirements Today's Visits Date Type Provider Dept  06/03/22 Office Visit Gillis Santa, MD [facility]  Showing today's visits and meeting all other requirements Future Appointments No visits were found meeting these conditions. Showing future appointments within next 90 days and meeting all other requirements  I discussed the assessment and treatment plan with the patient. The patient was provided an opportunity to ask questions and all were answered. The patient agreed with the plan and demonstrated an understanding of the instructions.  Patient advised to call back or seek an in-person evaluation if the symptoms or condition worsens.  Duration of encounter:77minutes.  Total time on encounter, as per AMA guidelines included both the face-to-face and non-face-to-face time personally spent by the physician and/or other qualified health care professional(s) on the day of the encounter (includes  time in activities that require the physician or other  qualified health care professional and does not include time in activities normally performed by clinical staff). Physician's time may include the following activities when performed: preparing to see the patient (eg, review of tests, pre-charting review of records) obtaining and/or reviewing separately obtained history performing a medically appropriate examination and/or evaluation counseling and educating the patient/family/caregiver ordering medications, tests, or procedures referring and communicating with other health care professionals (when not separately reported) documenting clinical information in the electronic or other health record independently interpreting results (not separately reported) and communicating results to the patient/ family/caregiver care coordination (not separately reported)  Note by: Gillis Santa, MD Date: 06/03/2022; Time: 8:39 AM

## 2022-06-04 ENCOUNTER — Telehealth: Payer: Self-pay

## 2022-06-04 NOTE — Telephone Encounter (Signed)
It hasn't been denied yet, so we cant do a p2p until it denies. In the mean time they want letter of necessity

## 2022-06-04 NOTE — Telephone Encounter (Signed)
His insurance company wants a letter of medical necessity for the [place].Marland KitchenMarland KitchenApparantly the notes I sent were not enough to satisfy the criteria.

## 2022-06-09 ENCOUNTER — Telehealth: Payer: 59 | Admitting: Student in an Organized Health Care Education/Training Program

## 2022-06-11 ENCOUNTER — Telehealth: Payer: 59 | Admitting: Physician Assistant

## 2022-06-11 DIAGNOSIS — T148XXA Other injury of unspecified body region, initial encounter: Secondary | ICD-10-CM

## 2022-06-11 DIAGNOSIS — L089 Local infection of the skin and subcutaneous tissue, unspecified: Secondary | ICD-10-CM

## 2022-06-12 MED ORDER — SULFAMETHOXAZOLE-TRIMETHOPRIM 800-160 MG PO TABS: 1.0000 | ORAL_TABLET | Freq: Two times a day (BID) | ORAL | 0 refills | Status: DC

## 2022-06-12 NOTE — Progress Notes (Signed)
E Visit for Cellulitis  We are sorry that you are not feeling well. Here is how we plan to help!  Based on what you shared with me it looks like you have cellulitis and infection of recent wound.  Cellulitis looks like areas of skin redness, swelling, and warmth; it develops as a result of bacteria entering under the skin. Little red spots and/or bleeding can be seen in skin, and tiny surface sacs containing fluid can occur. Fever can be present. Cellulitis is almost always on one side of a body, and the lower limbs are the most common site of involvement.   I have prescribed:  Bactrim DS 1 tablet by mouth twice a day for 7 days Keep skin clean and dry. Cover with bandage when you know there is a high likelihood of it getting "dirty". I want you to schedule a follow-up with your PCP within a week. If anything is worsening despite treatment, you need to seek an in-person evaluation ASAP.  HOME CARE:  Take your medications as ordered and take all of them, even if the skin irritation appears to be healing.   GET HELP RIGHT AWAY IF:  Symptoms that don't begin to go away within 48 hours. Severe redness persists or worsens If the area turns color, spreads or swells. If it blisters and opens, develops yellow-brown crust or bleeds. You develop a fever or chills. If the pain increases or becomes unbearable.  Are unable to keep fluids and food down.  MAKE SURE YOU   Understand these instructions. Will watch your condition. Will get help right away if you are not doing well or get worse.  Thank you for choosing an e-visit.  Your e-visit answers were reviewed by a board certified advanced clinical practitioner to complete your personal care plan. Depending upon the condition, your plan could have included both over the counter or prescription medications.  Please review your pharmacy choice. Make sure the pharmacy is open so you can pick up prescription now. If there is a problem, you may  contact your provider through [employer] and have the prescription routed to another pharmacy.  Your safety is important to [place]. If you have drug allergies check your prescription carefully.   For the next 24 hours you can use MyChart to ask questions about today's visit, request a non-urgent call back, or ask for a work or school excuse. You will get an email in the next two days asking about your experience. I hope that your e-visit has been valuable and will speed your recovery.

## 2022-06-12 NOTE — Progress Notes (Signed)
I have spent 5 minutes in review of e-visit questionnaire, review and updating patient chart, medical decision making and response to patient.   Shawn Becht Cody Siyona Coto, PA-C    

## 2022-06-23 ENCOUNTER — Encounter: Payer: Self-pay | Admitting: Family Medicine

## 2022-06-23 ENCOUNTER — Ambulatory Visit (INDEPENDENT_AMBULATORY_CARE_PROVIDER_SITE_OTHER): Payer: 59 | Admitting: Family Medicine

## 2022-06-23 VITALS — BP 143/80 | HR 116 | Temp 98.1°F | Wt 163.0 lb

## 2022-06-23 DIAGNOSIS — M2550 Pain in unspecified joint: Secondary | ICD-10-CM

## 2022-06-23 DIAGNOSIS — L03119 Cellulitis of unspecified part of limb: Secondary | ICD-10-CM

## 2022-06-23 DIAGNOSIS — M791 Myalgia, unspecified site: Secondary | ICD-10-CM

## 2022-06-23 MED ORDER — DOXYCYCLINE HYCLATE 100 MG PO TABS: 100.0000 mg | ORAL_TABLET | Freq: Two times a day (BID) | ORAL | 0 refills | Status: DC

## 2022-06-23 MED ORDER — KETOROLAC TROMETHAMINE 60 MG/2ML IM SOLN
60.0000 mg | Freq: Once | INTRAMUSCULAR | Status: AC
  Administered 2022-06-23: 60 mg via INTRAMUSCULAR

## 2022-06-23 MED ORDER — OXYCODONE HCL 5 MG PO TABS: 5.0000 mg | ORAL_TABLET | ORAL | 0 refills | Status: DC | PRN

## 2022-06-23 MED ORDER — PREDNISONE 20 MG PO TABS: 20.0000 mg | ORAL_TABLET | Freq: Every day | ORAL | 0 refills | Status: DC

## 2022-06-23 NOTE — Progress Notes (Deleted)
      Established patient visit   Patient: Shawn Crawford   DOB: 1961/05/27   61 y.o. Male  MRN: [id number] Visit Date: 06/24/2022  Today's healthcare provider: [name], MD   No chief complaint on file.  Subjective    HPI  Joint/Muscle Pain: Patient complains of {arthralgia:11431} for which has been present for {0-10:33138} {units:11}. Pain is located in {pain location list:14040}, is described as {pain character:10965}, and is {pain assessment:11180} .  Associated symptoms include: {injury negative list:11272}.  The patient has {pain relief:2013}.  Related to injury:   {yes/no:63}.   Medications: Outpatient Medications Prior to Visit  Medication Sig   amLODipine (NORVASC) 5 MG tablet TAKE 1 TABLET BY MOUTH DAILY   aspirin EC 81 MG tablet Take 81 mg by mouth daily.   Buprenorphine HCl (BELBUCA) 600 MCG FILM Place 1 Film inside cheek every 12 (twelve) hours.   desonide (DESOWEN) 0.05 % ointment Apply topically 3 (three) times a week. Apply topically on Mondays, Wednesdays, and Fridays. (Patient taking differently: Apply 1 application  topically daily as needed (dermatitis).)   fexofenadine (ALLEGRA) 180 MG tablet Take 180 mg by mouth daily as needed for allergies or rhinitis.   MULTIPLE VITAMINS PO Take 1 tablet by mouth daily.    rosuvastatin (CRESTOR) 40 MG tablet TAKE 1 TABLET BY MOUTH  DAILY   sulfamethoxazole-trimethoprim (BACTRIM DS) 800-160 MG tablet Take 1 tablet by mouth 2 (two) times daily. (Patient not taking: Reported on 06/23/2022)   tadalafil (CIALIS) 5 MG tablet TAKE 1 TABLET BY MOUTH DAILY   testosterone cypionate (DEPOTESTOSTERONE CYPIONATE) 200 MG/ML injection INJECT 1ML INTO THE MUSCLE EVERY 14 DAYS**CAN TRY TO GO TO 18 DAYS**   traMADol (ULTRAM) 50 MG tablet Take 1 tablet (50 mg total) by mouth every 8 (eight) hours as needed for severe pain. Month last 30 days.   vitamin B-12 (CYANOCOBALAMIN) 1000 MCG tablet Take 1,000 mcg by mouth daily.   zolpidem  (AMBIEN CR) 12.5 MG CR tablet TAKE ONE TABLET BY MOUTH AT BEDTIME AS NEEDED FOR SLEEP   No facility-administered medications prior to visit.    Review of Systems  {Labs  Heme  Chem  Endocrine  Serology  Results Review (optional):23779}   Objective    There were no vitals taken for this visit. {Show previous vital signs (optional):23777}  Physical Exam  ***  No results found for any visits on 06/24/22.  Assessment & Plan     ***  No follow-ups on file.      {provider attestation***:1}   [name], MD  [facility] [phone] (phone) [phone] (fax)  [facility]

## 2022-06-23 NOTE — Progress Notes (Unsigned)
      Established patient visit   Patient: Shawn Crawford   DOB: 1961-06-05   61 y.o. Male  MRN: [id number] Visit Date: 06/23/2022  Today's healthcare provider: [name], MD   No chief complaint on file.  Subjective    Fever  This is a new problem. The current episode started in the past 7 days. The problem has been unchanged. Maximum temperature: Hight 104.7. Associated symptoms include muscle aches. Pertinent negatives include no abdominal pain, congestion, coughing, diarrhea, headaches, nausea or urinary pain. He has tried acetaminophen and NSAIDs for the symptoms.    Follow up for wound infection  The patient was last seen for this 06/11/2022 Changes made at last visit include taking Bactrim for one week.  He reports excellent compliance with treatment. He feels that condition is Improved.  But pt reports the wound getting worse since finishing the antibiotic.  He is not having side effects.   -----------------------------------------------------------------------------------------   Medications: Outpatient Medications Prior to Visit  Medication Sig   amLODipine (NORVASC) 5 MG tablet TAKE 1 TABLET BY MOUTH DAILY   aspirin EC 81 MG tablet Take 81 mg by mouth daily.   Buprenorphine HCl (BELBUCA) 600 MCG FILM Place 1 Film inside cheek every 12 (twelve) hours.   desonide (DESOWEN) 0.05 % ointment Apply topically 3 (three) times a week. Apply topically on Mondays, Wednesdays, and Fridays. (Patient taking differently: Apply 1 application  topically daily as needed (dermatitis).)   fexofenadine (ALLEGRA) 180 MG tablet Take 180 mg by mouth daily as needed for allergies or rhinitis.   MULTIPLE VITAMINS PO Take 1 tablet by mouth daily.    rosuvastatin (CRESTOR) 40 MG tablet TAKE 1 TABLET BY MOUTH  DAILY   sulfamethoxazole-trimethoprim (BACTRIM DS) 800-160 MG tablet Take 1 tablet by mouth 2 (two) times daily.   tadalafil (CIALIS) 5 MG tablet TAKE 1 TABLET BY MOUTH DAILY    testosterone cypionate (DEPOTESTOSTERONE CYPIONATE) 200 MG/ML injection INJECT 1ML INTO THE MUSCLE EVERY 14 DAYS**CAN TRY TO GO TO 18 DAYS**   traMADol (ULTRAM) 50 MG tablet Take 1 tablet (50 mg total) by mouth every 8 (eight) hours as needed for severe pain. Month last 30 days.   vitamin B-12 (CYANOCOBALAMIN) 1000 MCG tablet Take 1,000 mcg by mouth daily.   zolpidem (AMBIEN CR) 12.5 MG CR tablet TAKE ONE TABLET BY MOUTH AT BEDTIME AS NEEDED FOR SLEEP   No facility-administered medications prior to visit.    Review of Systems  Constitutional:  Positive for fatigue and fever. Negative for activity change, appetite change, chills, diaphoresis and unexpected weight change.  HENT:  Negative for congestion.   Respiratory:  Negative for cough.   Gastrointestinal:  Negative for abdominal pain, diarrhea and nausea.  Genitourinary:  Negative for dysuria.  Musculoskeletal:  Positive for arthralgias, back pain and myalgias.  Neurological:  Negative for dizziness, light-headedness and headaches.       Objective    There were no vitals taken for this visit.   Physical Exam  ***  No results found for any visits on 06/23/22.  Assessment & Plan     ***  No follow-ups on file.      {provider attestation***:1}   [name], MD  [facility] [phone] (phone) [phone] (fax)  [place]

## 2022-06-24 ENCOUNTER — Ambulatory Visit: Payer: 59 | Admitting: Family Medicine

## 2022-06-24 LAB — CBC WITH DIFFERENTIAL/PLATELET
Basophils Absolute: 0 10*3/uL (ref 0.0–0.2)
Basos: 0 %
EOS (ABSOLUTE): 0.2 10*3/uL (ref 0.0–0.4)
Eos: 3 %
Hematocrit: 44.3 % (ref 37.5–51.0)
Hemoglobin: 14.8 g/dL (ref 13.0–17.7)
Immature Grans (Abs): 0 10*3/uL (ref 0.0–0.1)
Immature Granulocytes: 0 %
Lymphocytes Absolute: 0.5 10*3/uL — ABNORMAL LOW (ref 0.7–3.1)
Lymphs: 7 %
MCH: 29.9 pg (ref 26.6–33.0)
MCHC: 33.4 g/dL (ref 31.5–35.7)
MCV: 90 fL (ref 79–97)
Monocytes Absolute: 0.8 10*3/uL (ref 0.1–0.9)
Monocytes: 11 %
Neutrophils Absolute: 5.7 10*3/uL (ref 1.4–7.0)
Neutrophils: 79 %
Platelets: 152 10*3/uL (ref 150–450)
RBC: 4.95 x10E6/uL (ref 4.14–5.80)
RDW: 11.9 % (ref 11.6–15.4)
WBC: 7.2 10*3/uL (ref 3.4–10.8)

## 2022-06-24 LAB — RENAL FUNCTION PANEL
Albumin: 4.4 g/dL (ref 3.9–4.9)
BUN/Creatinine Ratio: 10 (ref 10–24)
BUN: 13 mg/dL (ref 8–27)
CO2: 21 mmol/L (ref 20–29)
Calcium: 9.2 mg/dL (ref 8.6–10.2)
Chloride: 97 mmol/L (ref 96–106)
Creatinine, Ser: 1.25 mg/dL (ref 0.76–1.27)
Glucose: 148 mg/dL — ABNORMAL HIGH (ref 70–99)
Phosphorus: 2.7 mg/dL — ABNORMAL LOW (ref 2.8–4.1)
Potassium: 4.8 mmol/L (ref 3.5–5.2)
Sodium: 138 mmol/L (ref 134–144)
eGFR: 65 mL/min/{1.73_m2} (ref >59)

## 2022-06-24 LAB — CK: Total CK: 133 U/L (ref 41–331)

## 2022-06-24 LAB — ANA W/REFLEX: Anti Nuclear Antibody (ANA): POSITIVE — AB

## 2022-06-24 LAB — ENA+DNA/DS+SJORGEN'S
ENA RNP Ab: 1.8 AI — ABNORMAL HIGH (ref 0.0–0.9)
ENA SM Ab Ser-aCnc: <0.2 AI (ref 0.0–0.9)
ENA SSA (RO) Ab: <0.2 AI (ref 0.0–0.9)
ENA SSB (LA) Ab: <0.2 AI (ref 0.0–0.9)
dsDNA Ab: 2 IU/mL (ref 0–9)

## 2022-06-24 LAB — SEDIMENTATION RATE: Sed Rate: 13 mm/hr (ref 0–30)

## 2022-07-02 ENCOUNTER — Encounter: Payer: Self-pay | Admitting: Student in an Organized Health Care Education/Training Program

## 2022-07-02 ENCOUNTER — Ambulatory Visit
Admission: RE | Admit: 2022-07-02 | Discharge: 2022-07-02 | Disposition: A | Discharge status: Home / Self Care | Payer: 59 | Source: Ambulatory Visit | Attending: Student in an Organized Health Care Education/Training Program | Admitting: Student in an Organized Health Care Education/Training Program

## 2022-07-02 ENCOUNTER — Ambulatory Visit
Payer: 59 | Attending: Student in an Organized Health Care Education/Training Program | Admitting: Student in an Organized Health Care Education/Training Program

## 2022-07-02 DIAGNOSIS — M5416 Radiculopathy, lumbar region: Secondary | ICD-10-CM | POA: Insufficient documentation

## 2022-07-02 DIAGNOSIS — M48061 Spinal stenosis, lumbar region without neurogenic claudication: Secondary | ICD-10-CM

## 2022-07-02 DIAGNOSIS — Z01810 Encounter for preprocedural cardiovascular examination: Secondary | ICD-10-CM | POA: Insufficient documentation

## 2022-07-02 DIAGNOSIS — G894 Chronic pain syndrome: Secondary | ICD-10-CM | POA: Insufficient documentation

## 2022-07-02 DIAGNOSIS — M48062 Spinal stenosis, lumbar region with neurogenic claudication: Secondary | ICD-10-CM | POA: Insufficient documentation

## 2022-07-02 DIAGNOSIS — Z7952 Long term (current) use of systemic steroids: Secondary | ICD-10-CM | POA: Insufficient documentation

## 2022-07-02 DIAGNOSIS — Z951 Presence of aortocoronary bypass graft: Secondary | ICD-10-CM | POA: Insufficient documentation

## 2022-07-02 DIAGNOSIS — G8929 Other chronic pain: Secondary | ICD-10-CM

## 2022-07-02 MED ORDER — DEXAMETHASONE SODIUM PHOSPHATE 10 MG/ML IJ SOLN
10.0000 mg | Freq: Once | INTRAMUSCULAR | Status: AC
  Administered 2022-07-02: 10 mg

## 2022-07-02 MED ORDER — SODIUM CHLORIDE (PF) 0.9 % IJ SOLN
INTRAMUSCULAR | Status: AC
  Filled 2022-07-02: qty 10

## 2022-07-02 MED ORDER — ROPIVACAINE HCL 2 MG/ML IJ SOLN
2.0000 mL | Freq: Once | INTRAMUSCULAR | Status: AC
  Administered 2022-07-02: 2 mL via EPIDURAL

## 2022-07-02 MED ORDER — ROPIVACAINE HCL 2 MG/ML IJ SOLN
INTRAMUSCULAR | Status: AC
  Filled 2022-07-02: qty 20

## 2022-07-02 MED ORDER — SODIUM CHLORIDE 0.9% FLUSH
2.0000 mL | Freq: Once | INTRAVENOUS | Status: AC
  Administered 2022-07-02: 2 mL

## 2022-07-02 MED ORDER — IOHEXOL 180 MG/ML  SOLN
INTRAMUSCULAR | Status: AC
  Filled 2022-07-02: qty 20

## 2022-07-02 MED ORDER — LIDOCAINE HCL 2 % IJ SOLN
INTRAMUSCULAR | Status: AC
  Filled 2022-07-02: qty 20

## 2022-07-02 MED ORDER — DEXAMETHASONE SODIUM PHOSPHATE 10 MG/ML IJ SOLN
INTRAMUSCULAR | Status: AC
  Filled 2022-07-02: qty 1

## 2022-07-02 MED ORDER — IOHEXOL 180 MG/ML  SOLN
10.0000 mL | Freq: Once | INTRAMUSCULAR | Status: AC
  Administered 2022-07-02: 10 mL via EPIDURAL

## 2022-07-02 MED ORDER — LIDOCAINE HCL 2 % IJ SOLN
20.0000 mL | Freq: Once | INTRAMUSCULAR | Status: AC
  Administered 2022-07-02: 400 mg

## 2022-07-02 NOTE — Progress Notes (Signed)
PROVIDER NOTE: Interpretation of information contained herein should be left to medically-trained personnel. Specific patient instructions are provided elsewhere under "Patient Instructions" section of medical record. This document was created in part using STT-dictation technology, any transcriptional errors that may result from this process are unintentional.  Patient: Shawn Crawford Type: Established DOB: 26-Jul-1961 MRN: [id number] PCP: Jerrol Banana., MD  Service: Procedure DOS: 07/02/2022 Setting: Ambulatory Location: Ambulatory outpatient facility Delivery: Face-to-face Provider: Gillis Santa, MD Specialty: Interventional Pain Management Specialty designation: 09 Location: Outpatient facility Ref. Prov.: Gillis Santa, MD    Primary Reason for Visit: Interventional Pain Management Treatment. CC: Back Pain (lower)   Procedure:           Type: Lumbar epidural steroid injection (LESI) (interlaminar) #2    Laterality: Midline   Level:  L4-5 Level.  Imaging: Fluoroscopic guidance         Anesthesia: Local anesthesia (1-2% Lidocaine) DOS: 07/02/2022  Performed by: Gillis Santa, MD  Purpose: Diagnostic/Therapeutic Indications: Lumbar radicular pain of intraspinal etiology of more than 4 weeks that has failed to respond to conservative therapy and is severe enough to impact quality of life or function. 1. Neuroforaminal stenosis of lumbar spine   2. Chronic radicular lumbar pain   3. Spinal stenosis, lumbar region, with neurogenic claudication   4. Lumbar radiculopathy   5. Chronic pain syndrome    NAS-11 Pain score:   Pre-procedure: 3 /10   Post-procedure: 0-No pain/10      Position / Prep / Materials:  Position: Prone w/ head of the table raised (slight reverse trendelenburg) to facilitate breathing.  Prep solution: DuraPrep (Iodine Povacrylex [0.7% available iodine] and Isopropyl Alcohol, 74% w/w) Prep Area: Entire Posterior Lumbar Region from lower scapular tip down  to mid buttocks area and from flank to flank. Materials:  Tray: Epidural tray Needle(s):  Type: Epidural needle (Tuohy) Gauge (G):  22 Length: Regular (3.5-in) Qty: 1  Pre-op H&P Assessment:  Shawn Crawford is a 61 y.o. (year old), male patient, seen today for interventional treatment. He  has a past surgical history that includes Coronary artery bypass graft; Hernia repair; Appendectomy; Cardiac catheterization; Colonoscopy with propofol (N/A, 06/24/2018); Esophagogastroduodenoscopy (egd) with propofol (N/A, 06/24/2018); Exam under anesthesia with hemorrhoidectomy (N/A, 11/11/2018); and Insertion of mesh (02/05/2022). Shawn Crawford has a current medication list which includes the following prescription(s): amlodipine, aspirin ec, belbuca, desonide, multiple vitamin, rosuvastatin, tadalafil, testosterone cypionate, tramadol, cyanocobalamin, zolpidem, doxycycline, fexofenadine, oxycodone, prednisone, and sulfamethoxazole-trimethoprim. His primarily concern today is the Back Pain (lower)  Initial Vital Signs:  Pulse/HCG Rate: 94  Temp:  (!) 96.8 F (36 C) Resp: 16 BP: 112/76 SpO2: 98 %  BMI: Estimated body mass index is 21.76 kg/m as calculated from the following:   Height as of this encounter: '5\' 11"'$  (1.803 m).   Weight as of this encounter: 156 lb (70.8 kg).  Risk Assessment: Allergies: Reviewed. He is allergic to penicillins.  Allergy Precautions: None required Coagulopathies: Reviewed. None identified.  Blood-thinner therapy: None at this time Active Infection(s): Reviewed. None identified. Shawn Crawford is afebrile  Site Confirmation: Shawn Crawford was asked to confirm the procedure and laterality before marking the site Procedure checklist: Completed Consent: Before the procedure and under the influence of no sedative(s), amnesic(s), or anxiolytics, the patient was informed of the treatment options, risks and possible complications. To fulfill our ethical and legal obligations, as recommended by the  American Medical Association's Code of Ethics, I have informed the patient of my clinical impression;  the nature and purpose of the treatment or procedure; the risks, benefits, and possible complications of the intervention; the alternatives, including doing nothing; the risk(s) and benefit(s) of the alternative treatment(s) or procedure(s); and the risk(s) and benefit(s) of doing nothing. The patient was provided information about the general risks and possible complications associated with the procedure. These may include, but are not limited to: failure to achieve desired goals, infection, bleeding, organ or nerve damage, allergic reactions, paralysis, and death. In addition, the patient was informed of those risks and complications associated to Spine-related procedures, such as failure to decrease pain; infection (i.e.: Meningitis, epidural or intraspinal abscess); bleeding (i.e.: epidural hematoma, subarachnoid hemorrhage, or any other type of intraspinal or peri-dural bleeding); organ or nerve damage (i.e.: Any type of peripheral nerve, nerve root, or spinal cord injury) with subsequent damage to sensory, motor, and/or autonomic systems, resulting in permanent pain, numbness, and/or weakness of one or several areas of the body; allergic reactions; (i.e.: anaphylactic reaction); and/or death. Furthermore, the patient was informed of those risks and complications associated with the medications. These include, but are not limited to: allergic reactions (i.e.: anaphylactic or anaphylactoid reaction(s)); adrenal axis suppression; blood sugar elevation that in diabetics may result in ketoacidosis or comma; water retention that in patients with history of congestive heart failure may result in shortness of breath, pulmonary edema, and decompensation with resultant heart failure; weight gain; swelling or edema; medication-induced neural toxicity; particulate matter embolism and blood vessel occlusion with  resultant organ, and/or nervous system infarction; and/or aseptic necrosis of one or more joints. Finally, the patient was informed that Medicine is not an exact science; therefore, there is also the possibility of unforeseen or unpredictable risks and/or possible complications that may result in a catastrophic outcome. The patient indicated having understood very clearly. We have given the patient no guarantees and we have made no promises. Enough time was given to the patient to ask questions, all of which were answered to the patient's satisfaction. Mr. Tiso has indicated that he wanted to continue with the procedure. Attestation: I, the ordering provider, attest that I have discussed with the patient the benefits, risks, side-effects, alternatives, likelihood of achieving goals, and potential problems during recovery for the procedure that I have provided informed consent. Date  Time: 07/02/2022  9:28 AM  Pre-Procedure Preparation:  Monitoring: As per clinic protocol. Respiration, ETCO2, SpO2, BP, heart rate and rhythm monitor placed and checked for adequate function Safety Precautions: Patient was assessed for positional comfort and pressure points before starting the procedure. Time-out: I initiated and conducted the "Time-out" before starting the procedure, as per protocol. The patient was asked to participate by confirming the accuracy of the "Time Out" information. Verification of the correct person, site, and procedure were performed and confirmed by me, the nursing staff, and the patient. "Time-out" conducted as per Joint Commission's Universal Protocol (UP.01.01.01). Time: 0957  Description/Narrative of Procedure:          Target: Epidural space via interlaminar opening, initially targeting the lower laminar border of the superior vertebral body. Region: Lumbar Approach: Percutaneous paravertebral  Rationale (medical necessity): procedure needed and proper for the diagnosis and/or  treatment of the patient's medical symptoms and needs. Procedural Technique Safety Precautions: Aspiration looking for blood return was conducted prior to all injections. At no point did we inject any substances, as a needle was being advanced. No attempts were made at seeking any paresthesias. Safe injection practices and needle disposal techniques used. Medications properly checked  for expiration dates. SDV (single dose vial) medications used. Description of the Procedure: Protocol guidelines were followed. The procedure needle was introduced through the skin, ipsilateral to the reported pain, and advanced to the target area. Bone was contacted and the needle walked caudad, until the lamina was cleared. The epidural space was identified using "loss-of-resistance technique" with 2-3 ml of PF-NaCl (0.9% NSS), in a 5cc LOR glass syringe.  6cc solution made of 3 cc of preservative-free saline, 2 cc of 0.2% ropivacaine, 1 cc of Decadron 10 mg/cc.   Vitals:   07/02/22 0930 07/02/22 0947 07/02/22 0957  BP: 112/76 119/84 130/82  Pulse: 94 90 89  Resp: '16 13 12  '$ Temp: (!) 96.8 F (36 C)    TempSrc: Temporal    SpO2: 98%    Weight: 156 lb (70.8 kg)    Height: '5\' 11"'$  (1.803 m)      Start Time: 0957 hrs. End Time: 1001 hrs.  Imaging Guidance (Spinal):          Type of Imaging Technique: Fluoroscopy Guidance (Spinal) Indication(s): Assistance in needle guidance and placement for procedures requiring needle placement in or near specific anatomical locations not easily accessible without such assistance. Exposure Time: Please see nurses notes. Contrast: Before injecting any contrast, we confirmed that the patient did not have an allergy to iodine, shellfish, or radiological contrast. Once satisfactory needle placement was completed at the desired level, radiological contrast was injected. Contrast injected under live fluoroscopy. No contrast complications. See chart for type and volume of contrast  used. Fluoroscopic Guidance: I was personally present during the use of fluoroscopy. "Tunnel Vision Technique" used to obtain the best possible view of the target area. Parallax error corrected before commencing the procedure. "Direction-depth-direction" technique used to introduce the needle under continuous pulsed fluoroscopy. Once target was reached, antero-posterior, oblique, and lateral fluoroscopic projection used confirm needle placement in all planes. Images permanently stored in EMR. Interpretation: I personally interpreted the imaging intraoperatively. Adequate needle placement confirmed in multiple planes. Appropriate spread of contrast into desired area was observed. No evidence of afferent or efferent intravascular uptake. No intrathecal or subarachnoid spread observed. Permanent images saved into the patient's record.  Antibiotic Prophylaxis:   Anti-infectives (From admission, onward)    None      Indication(s): None identified  Post-operative Assessment:  Post-procedure Vital Signs:  Pulse/HCG Rate: 89  Temp:  (!) 96.8 F (36 C) Resp: 12 BP: 130/82 SpO2: 98 %  EBL: None  Complications: No immediate post-treatment complications observed by team, or reported by patient.  Note: The patient tolerated the entire procedure well. A repeat set of vitals were taken after the procedure and the patient was kept under observation following institutional policy, for this type of procedure. Post-procedural neurological assessment was performed, showing return to baseline, prior to discharge. The patient was provided with post-procedure discharge instructions, including a section on how to identify potential problems. Should any problems arise concerning this procedure, the patient was given instructions to immediately contact us, at any time, without hesitation. In any case, we plan to contact the patient by telephone for a follow-up status report regarding this interventional  procedure.  Comments:  No additional relevant information.  5 out of 5 strength bilateral lower extremity: Plantar flexion, dorsiflexion, knee flexion, knee extension.   Plan of Care  Orders:  Orders Placed This Encounter  Procedures   DG PAIN CLINIC C-ARM 1-60 MIN NO REPORT    Intraoperative interpretation by procedural physician at [facility]  Pain Facility.    Standing Status:   Standing    Number of Occurrences:   1    Order Specific Question:   Reason for exam:    Answer:   Assistance in needle guidance and placement for procedures requiring needle placement in or near specific anatomical locations not easily accessible without such assistance.   Chronic Opioid Analgesic:  Belbuca 600 mcg BID, Tramadol 50 mg q8 hrs breakthrough pain    Medications ordered for procedure: Meds ordered this encounter  Medications   iohexol (OMNIPAQUE) 180 MG/ML injection 10 mL    Must be Myelogram-compatible. If not available, you may substitute with a water-soluble, non-ionic, hypoallergenic, myelogram-compatible radiological contrast medium.   lidocaine (XYLOCAINE) 2 % (with pres) injection 400 mg   sodium chloride flush (NS) 0.9 % injection 2 mL   ropivacaine (PF) 2 mg/mL (0.2%) (NAROPIN) injection 2 mL   dexamethasone (DECADRON) injection 10 mg   Medications administered: We administered iohexol, lidocaine, sodium chloride flush, ropivacaine (PF) 2 mg/mL (0.2%), and dexamethasone.  See the medical record for exact dosing, route, and time of administration.  Follow-up plan:   Return for Keep sch. appt.      Recent Visits Date Type Provider Dept  06/03/22 Office Visit Gillis Santa, MD [facility]  05/12/22 Procedure visit Gillis Santa, MD [facility]  04/29/22 Office Visit Gillis Santa, MD [facility]  Showing recent visits within past 90 days and meeting all other requirements Today's Visits Date Type Provider Dept  07/02/22 Procedure visit Gillis Santa, MD [facility]  Showing today's visits and meeting all other requirements Future Appointments Date Type Provider Dept  08/26/22 Appointment Gillis Santa, MD [facility]  Showing future appointments within next 90 days and meeting all other requirements  Disposition: Discharge home  Discharge (Date  Time): 07/02/2022; 1005 hrs.   Primary Care Physician: Jerrol Banana., MD Location: [facility] Note by: Gillis Santa, MD Date: 07/02/2022; Time: 10:11 AM  Disclaimer:  Medicine is not an exact science. The only guarantee in medicine is that nothing is guaranteed. It is important to note that the decision to proceed with this intervention was based on the information collected from the patient. The Data and conclusions were drawn from the patient's questionnaire, the interview, and the physical examination. Because the information was provided in large part by the patient, it cannot be guaranteed that it has not been purposely or unconsciously manipulated. Every effort has been made to obtain as much relevant data as possible for this evaluation. It is important to note that the conclusions that lead to this procedure are derived in large part from the available data. Always take into account that the treatment will also be dependent on availability of resources and existing treatment guidelines, considered by other Pain Management Practitioners as being common knowledge and practice, at the time of the intervention. For Medico-Legal purposes, it is also important to point out that variation in procedural techniques and pharmacological choices are the acceptable norm. The indications, contraindications, technique, and results of the above procedure should only be interpreted and judged by a Board-Certified Interventional Pain Specialist with extensive familiarity and expertise in the same exact procedure and technique.

## 2022-07-02 NOTE — Patient Instructions (Signed)
____________________________________________________________________________________________  Post-Procedure Discharge Instructions  Instructions: Apply ice:  Purpose: This will minimize any swelling and discomfort after procedure.  When: Day of procedure, as soon as you get home. How: Fill a plastic sandwich bag with crushed ice. Cover it with a small towel and apply to injection site. How long: (15 min on, 15 min off) Apply for 15 minutes then remove x 15 minutes.  Repeat sequence on day of procedure, until you go to bed. Apply heat:  Purpose: To treat any soreness and discomfort from the procedure. When: Starting the next day after the procedure. How: Apply heat to procedure site starting the day following the procedure. How long: May continue to repeat daily, until discomfort goes away. Food intake: Start with clear liquids (like water) and advance to regular food, as tolerated.  Physical activities: Keep activities to a minimum for the first 8 hours after the procedure. After that, then as tolerated. Driving: If you have received any sedation, be responsible and do not drive. You are not allowed to drive for 24 hours after having sedation. Blood thinner: (Applies only to those taking blood thinners) You may restart your blood thinner 6 hours after your procedure. Insulin: (Applies only to Diabetic patients taking insulin) As soon as you can eat, you may resume your normal dosing schedule. Infection prevention: Keep procedure site clean and dry. Shower daily and clean area with soap and water. Post-procedure Pain Diary: Extremely important that this be done correctly and accurately. Recorded information will be used to determine the next step in treatment. For the purpose of accuracy, follow these rules: Evaluate only the area treated. Do not report or include pain from an untreated area. For the purpose of this evaluation, ignore all other areas of pain, except for the treated area. After  your procedure, avoid taking a long nap and attempting to complete the pain diary after you wake up. Instead, set your alarm clock to go off every hour, on the hour, for the initial 8 hours after the procedure. Document the duration of the numbing medicine, and the relief you are getting from it. Do not go to sleep and attempt to complete it later. It will not be accurate. If you received sedation, it is likely that you were given a medication that may cause amnesia. Because of this, completing the diary at a later time may cause the information to be inaccurate. This information is needed to plan your care. Follow-up appointment: Keep your post-procedure follow-up evaluation appointment after the procedure (usually 2 weeks for most procedures, 6 weeks for radiofrequencies). DO NOT FORGET to bring you pain diary with you.   Expect: (What should I expect to see with my procedure?) From numbing medicine (AKA: Local Anesthetics): Numbness or decrease in pain. You may also experience some weakness, which if present, could last for the duration of the local anesthetic. Onset: Full effect within 15 minutes of injected. Duration: It will depend on the type of local anesthetic used. On the average, 1 to 8 hours.  From steroids (Applies only if steroids were used): Decrease in swelling or inflammation. Once inflammation is improved, relief of the pain will follow. Onset of benefits: Depends on the amount of swelling present. The more swelling, the longer it will take for the benefits to be seen. In some cases, up to 10 days. Duration: Steroids will stay in the system x 2 weeks. Duration of benefits will depend on multiple posibilities including persistent irritating factors. Side-effects: If present, they  may typically last 2 weeks (the duration of the steroids). Frequent: Cramps (if they occur, drink Gatorade and take over-the-counter Magnesium 450-500 mg once to twice a day); water retention with temporary  weight gain; increases in blood sugar; decreased immune system response; increased appetite. Occasional: Facial flushing (red, warm cheeks); mood swings; menstrual changes. Uncommon: Long-term decrease or suppression of natural hormones; bone thinning. (These are more common with higher doses or more frequent use. This is why we prefer that our patients avoid having any injection therapies in other practices.)  Very Rare: Severe mood changes; psychosis; aseptic necrosis. From procedure: Some discomfort is to be expected once the numbing medicine wears off. This should be minimal if ice and heat are applied as instructed.  Call if: (When should I call?) You experience numbness and weakness that gets worse with time, as opposed to wearing off. New onset bowel or bladder incontinence. (Applies only to procedures done in the spine)  Emergency Numbers: Durning business hours (Monday - Thursday, 8:00 AM - 4:00 PM) (Friday, 9:00 AM - 12:00 Noon): [phone] After hours: [phone] NOTE: If you are having a problem and are unable connect with, or to talk to a provider, then go to your nearest urgent care or emergency department. If the problem is serious and urgent, please call 911. ____________________________________________________________________________________________  Epidural Steroid Injection  An epidural steroid injection is a shot of steroid medicine, also called cortisone, and a numbing medicine that is given into the epidural space. This space is between the spinal cord and the bones of the back. This shot helps relieve pain caused by an irritated or swollen nerve root. The amount of pain relief you get from the injection depends on what is causing the nerve to be swollen and irritated, and how long your pain lasts. You may have a period of slightly more pain after your injection, before the steroid medicine takes effect. This medicine usually starts working within 1-3 days. In some  cases, you might need 7-10 days to feel the full effect. Tell your health care provider about: Any allergies you have. All medicines you are taking, including vitamins, herbs, eye drops, creams, and over-the-counter medicines. Any problems you or family members have had with anesthetic medicines. Any bleeding problems you have. Any surgeries you have had. Any medical conditions you have. Whether you are pregnant or may be pregnant. What are the risks? Your health care provider will talk with you about risks. These may include: Headache. Bleeding. Infection. Allergic reaction to medicines or dyes. Nerve damage. Not being able to move (paralysis). This is rare. What happens before the procedure? Medicines You may be given medicines to lower anxiety. Ask your health care provider about: Changing or stopping your regular medicines. These include any diabetes medicines or blood thinners you take. Taking medicines such as aspirin and ibuprofen. These medicines can thin your blood. Do not take them unless your health care provider tells you to. Taking over-the-counter medicines, vitamins, herbs, and supplements. General instructions Follow instructions from your health care provider about what you may eat and drink. Ask your health care provider what steps will be taken to help prevent infection. If you will be going home right after the procedure, plan to have a responsible adult: Take you home from the hospital or clinic. You will not be allowed to drive. Care for you for the time you are told. What happens during the procedure?  An IV will be inserted into one of your veins.  You may be given a sedative. This helps you relax. You will be asked to lie on your side or sit. The injection site will be cleaned. An X-ray machine will be used to guide the needle as close as possible to the nerve causing pain. A needle will be put through your skin into the epidural space. This may cause you  some discomfort. Contrast dye may be injected at the site to make sure that the steroid medicine will be sent to the exact place it needs to go. The steroid medicine and a numbing medicine (local anesthesia) will be injected into the epidural space for pain relief. The needle and IV will be removed. A bandage (dressing) will be put over the injection site. The procedure may vary among health care providers and hospitals. What happens after the procedure? Your blood pressure, heart rate, breathing rate, and blood oxygen level will be monitored until you leave the hospital or clinic. Your arm or leg may feel weak or numb for a few hours. Summary An epidural steroid injection is a shot of steroid medicine and a numbing medicine that is given into the epidural space. The shot helps relieve pain caused by an irritated or swollen nerve root. The steroid medicine usually starts working within 1-3 days. In some cases, you might need 7-10 days to feel the full effect. This information is not intended to replace advice given to you by your health care provider. Make sure you discuss any questions you have with your health care provider. Document Revised: 01/07/2022 Document Reviewed: 01/07/2022 Elsevier Patient Education  [place].

## 2022-07-02 NOTE — Progress Notes (Signed)
Safety precautions to be maintained throughout the outpatient stay will include: orient to surroundings, keep bed in low position, maintain call bell within reach at all times, provide assistance with transfer out of bed and ambulation.  

## 2022-07-03 ENCOUNTER — Telehealth: Payer: Self-pay

## 2022-07-03 NOTE — Telephone Encounter (Signed)
Post procedure phone call.  LM 

## 2022-07-10 ENCOUNTER — Encounter: Payer: Self-pay | Admitting: Family Medicine

## 2022-07-10 ENCOUNTER — Ambulatory Visit
Admission: RE | Admit: 2022-07-10 | Discharge: 2022-07-10 | Disposition: A | Discharge status: Home / Self Care | Payer: 59 | Source: Ambulatory Visit | Attending: Family Medicine | Admitting: Family Medicine

## 2022-07-10 ENCOUNTER — Ambulatory Visit
Admission: RE | Admit: 2022-07-10 | Discharge: 2022-07-10 | Disposition: A | Discharge status: Home / Self Care | Payer: 59 | Attending: Family Medicine | Admitting: Family Medicine

## 2022-07-10 ENCOUNTER — Ambulatory Visit: Payer: 59 | Admitting: Family Medicine

## 2022-07-10 VITALS — BP 136/78 | HR 108 | Resp 16 | Ht 71.0 in | Wt 161.6 lb

## 2022-07-10 DIAGNOSIS — M25571 Pain in right ankle and joints of right foot: Secondary | ICD-10-CM | POA: Insufficient documentation

## 2022-07-10 DIAGNOSIS — S99812A Other specified injuries of left ankle, initial encounter: Secondary | ICD-10-CM | POA: Insufficient documentation

## 2022-07-10 DIAGNOSIS — M25572 Pain in left ankle and joints of left foot: Secondary | ICD-10-CM

## 2022-07-10 DIAGNOSIS — M25469 Effusion, unspecified knee: Secondary | ICD-10-CM | POA: Diagnosis not present

## 2022-07-10 DIAGNOSIS — M25471 Effusion, right ankle: Secondary | ICD-10-CM | POA: Insufficient documentation

## 2022-07-10 DIAGNOSIS — W3189XA Contact with other specified machinery, initial encounter: Secondary | ICD-10-CM | POA: Diagnosis not present

## 2022-07-10 DIAGNOSIS — M25472 Effusion, left ankle: Secondary | ICD-10-CM | POA: Diagnosis not present

## 2022-07-10 DIAGNOSIS — S99811A Other specified injuries of right ankle, initial encounter: Secondary | ICD-10-CM | POA: Insufficient documentation

## 2022-07-10 MED ORDER — MELOXICAM 7.5 MG PO TABS: 7.5000 mg | ORAL_TABLET | Freq: Every day | ORAL | 0 refills | Status: DC

## 2022-07-10 NOTE — Assessment & Plan Note (Signed)
Ankle swelling and lower extremity in the area of anterior distal tibia bilaterally We will send patient for x-rays of bilateral ankles to assess for any joint changes I suspect the swelling is related to joint effusions We reviewed patient's labs that showed positive RNP indicating autoimmune connective tissue disease as well as positive RNA We will place a referral to rheumatology today Patient will follow-up in the next 4-6 weeks

## 2022-07-10 NOTE — Progress Notes (Signed)
I,Shawn Crawford,acting as a scribe for [employer], MD.,have documented all relevant documentation on the behalf of Shawn Foster, MD,as directed by  Shawn Foster, MD while in the presence of Shawn Foster, MD.   Established patient visit   Patient: Shawn Crawford   DOB: [birth year]   61 y.o. Male  MRN: [id number] Visit Date: 07/10/2022  Today's healthcare provider: [name], MD   Chief Complaint  Patient presents with   Follow-Up Arthralgia   Subjective    HPI   Follow up for Arthralgia  The patient was last seen for this 2 weeks ago. Changes made at last visit include  Toradol IM in the office and prednisone 20 mg daily for a week in addition to oxycodone  for the pain. Patient states that pain has improved, he has completed his prednisone course and he is not in as much pain so he has not been using the oxycodone He states that the area on his left lower extremity has healed well He reports he continues to have tenderness on bilateral tibia and continues to have swelling of bilateral ankle joints   Lab Results  Component Value Date   ANA Positive (A) 06/23/2022  ---------------------------   Medications: Outpatient Medications Prior to Visit  Medication Sig   amLODipine (NORVASC) 5 MG tablet TAKE 1 TABLET BY MOUTH DAILY   aspirin EC 81 MG tablet Take 81 mg by mouth daily.   Buprenorphine HCl (BELBUCA) 600 MCG FILM Place 1 Film inside cheek every 12 (twelve) hours.   desonide (DESOWEN) 0.05 % ointment Apply topically 3 (three) times a week. Apply topically on Mondays, Wednesdays, and Fridays. (Patient taking differently: Apply 1 application  topically daily as needed (dermatitis).)   MULTIPLE VITAMINS PO Take 1 tablet by mouth daily.    rosuvastatin (CRESTOR) 40 MG tablet TAKE 1 TABLET BY MOUTH  DAILY   tadalafil (CIALIS) 5 MG tablet TAKE 1 TABLET BY MOUTH DAILY   testosterone cypionate (DEPOTESTOSTERONE  CYPIONATE) 200 MG/ML injection INJECT 1ML INTO THE MUSCLE EVERY 14 DAYS**CAN TRY TO GO TO 18 DAYS**   traMADol (ULTRAM) 50 MG tablet Take 1 tablet (50 mg total) by mouth every 8 (eight) hours as needed for severe pain. Month last 30 days.   vitamin B-12 (CYANOCOBALAMIN) 1000 MCG tablet Take 1,000 mcg by mouth daily.   zolpidem (AMBIEN CR) 12.5 MG CR tablet TAKE ONE TABLET BY MOUTH AT BEDTIME AS NEEDED FOR SLEEP   [DISCONTINUED] doxycycline (VIBRA-TABS) 100 MG tablet Take 1 tablet (100 mg total) by mouth 2 (two) times daily.   [DISCONTINUED] fexofenadine (ALLEGRA) 180 MG tablet Take 180 mg by mouth daily as needed for allergies or rhinitis.   [DISCONTINUED] oxyCODONE (OXY IR/ROXICODONE) 5 MG immediate release tablet Take 1 tablet (5 mg total) by mouth every 4 (four) hours as needed for severe pain.   [DISCONTINUED] predniSONE (DELTASONE) 20 MG tablet Take 1 tablet (20 mg total) by mouth daily with breakfast.   [DISCONTINUED] sulfamethoxazole-trimethoprim (BACTRIM DS) 800-160 MG tablet Take 1 tablet by mouth 2 (two) times daily. (Patient not taking: Reported on 06/23/2022)   No facility-administered medications prior to visit.    Review of Systems     Objective    BP 136/78 (BP Location: Left Arm, Patient Position: Sitting, Cuff Size: Normal)   Pulse (!) 108   Resp 16   Ht '5\' 11"'$  (1.803 m)   Wt 161 lb 9.6 oz (73.3 kg)   BMI 22.54 kg/m  Physical Exam Vitals reviewed.  Constitutional:      General: He is not in acute distress.    Appearance: Normal appearance. He is not ill-appearing, toxic-appearing or diaphoretic.  Eyes:     Conjunctiva/sclera: Conjunctivae normal.  Cardiovascular:     Rate and Rhythm: Normal rate and regular rhythm.     Pulses: Normal pulses.          Dorsalis pedis pulses are 2+ on the right side and 2+ on the left side.       Posterior tibial pulses are 2+ on the right side and 2+ on the left side.     Heart sounds: Normal heart sounds. No murmur heard.     No friction rub. No gallop.  Pulmonary:     Effort: Pulmonary effort is normal. No respiratory distress.     Breath sounds: Normal breath sounds. No stridor. No wheezing, rhonchi or rales.  Musculoskeletal:     Right lower leg: No edema.     Left lower leg: No edema.     Right foot: Normal range of motion.     Left foot: Normal range of motion.     Comments: Left medial lower extremity shows well-healed injury without erythema, streaking, no warmth to touch  Feet:     Right foot:     Skin integrity: Skin integrity normal. No erythema or warmth.     Toenail Condition: Right toenails are normal.     Left foot:     Skin integrity: Skin integrity normal. No erythema or warmth.     Toenail Condition: Left toenails are normal.     Comments: Bilateral ankles with swelling Tenderness anterior ankle joints bilaterally  Neurological:     Mental Status: He is alert and oriented to person, place, and time.       No results found for any visits on 07/10/22.  Assessment & Plan     Problem List Items Addressed This Visit       Other   Joint swelling of lower leg    Ankle swelling and lower extremity in the area of anterior distal tibia bilaterally We will send patient for x-rays of bilateral ankles to assess for any joint changes I suspect the swelling is related to joint effusions We reviewed patient's labs that showed positive RNP indicating autoimmune connective tissue disease as well as positive RNA We will place a referral to rheumatology today Patient will follow-up in the next 4-6 weeks      Relevant Orders   Ambulatory referral to Rheumatology   Arthralgia of ankle, left - Primary    Ankle swelling and lower extremity in the area of anterior distal tibia bilaterally We will send patient for x-rays of bilateral ankles to assess for any joint changes I suspect the swelling is related to joint effusions We reviewed patient's labs that showed positive RNP indicating autoimmune  connective tissue disease as well as positive RNA We will place a referral to rheumatology today Patient will follow-up in the next 4-6 weeks      Relevant Orders   DG Ankle Complete Left   DG Ankle Complete Right   Ambulatory referral to Rheumatology   Arthralgia of ankle, right    Ankle swelling and lower extremity in the area of anterior distal tibia bilaterally We will send patient for x-rays of bilateral ankles to assess for any joint changes I suspect the swelling is related to joint effusions We reviewed patient's labs that showed positive RNP indicating  autoimmune connective tissue disease as well as positive RNA We will place a referral to rheumatology today Patient will follow-up in the next 4-6 weeks Prescribed meloxicam to help with symptoms       Relevant Orders   DG Ankle Complete Left   DG Ankle Complete Right   Ambulatory referral to Rheumatology     Return in about 4 weeks (around 08/07/2022) for Joint pain.      I, Shawn Foster, MD, have reviewed all documentation for this visit. The documentation on 07/10/22 for the exam, diagnosis, procedures, and orders are all accurate and complete.    Shawn Foster, MD  [facility] [phone] (phone) [phone] (fax)  [place]

## 2022-07-10 NOTE — Assessment & Plan Note (Signed)
Ankle swelling and lower extremity in the area of anterior distal tibia bilaterally We will send patient for x-rays of bilateral ankles to assess for any joint changes I suspect the swelling is related to joint effusions We reviewed patient's labs that showed positive RNP indicating autoimmune connective tissue disease as well as positive RNA We will place a referral to rheumatology today Patient will follow-up in the next 4-6 weeks Prescribed meloxicam to help with symptoms

## 2022-07-28 ENCOUNTER — Encounter (INDEPENDENT_AMBULATORY_CARE_PROVIDER_SITE_OTHER): Payer: Self-pay

## 2022-07-31 ENCOUNTER — Other Ambulatory Visit: Payer: Self-pay | Admitting: Family Medicine

## 2022-08-11 ENCOUNTER — Encounter: Payer: Self-pay | Admitting: Family Medicine

## 2022-08-11 ENCOUNTER — Ambulatory Visit: Payer: 59 | Admitting: Family Medicine

## 2022-08-11 ENCOUNTER — Telehealth: Payer: Self-pay

## 2022-08-11 VITALS — BP 134/83 | HR 94 | Temp 98.6°F | Resp 16 | Wt 159.8 lb

## 2022-08-11 DIAGNOSIS — M25572 Pain in left ankle and joints of left foot: Secondary | ICD-10-CM | POA: Diagnosis not present

## 2022-08-11 DIAGNOSIS — Z0000 Encounter for general adult medical examination without abnormal findings: Secondary | ICD-10-CM | POA: Insufficient documentation

## 2022-08-11 DIAGNOSIS — Z23 Encounter for immunization: Secondary | ICD-10-CM

## 2022-08-11 DIAGNOSIS — M25571 Pain in right ankle and joints of right foot: Secondary | ICD-10-CM

## 2022-08-11 NOTE — Telephone Encounter (Signed)
Refill request by fax from [facility] on the following medication Gabapentin 400 mg capsule Qty:90 Sig: Take 1 capsule by mouth 3 times daily Date Written:02/20/2022 Last Filled: 07/10/2022

## 2022-08-11 NOTE — Assessment & Plan Note (Addendum)
Influenza vaccine administered today  Recommended Shingles vaccine, patient plans to schedule this in office in the next few months

## 2022-08-11 NOTE — Assessment & Plan Note (Signed)
Influenza vaccine was administered today  Patient tolerated well   

## 2022-08-11 NOTE — Progress Notes (Signed)
I,Joseline E Rosas,acting as a scribe for [employer], MD.,have documented all relevant documentation on the behalf of Shawn Foster, MD,as directed by  Shawn Foster, MD while in the presence of Shawn Foster, MD.    Established patient visit   Patient: Shawn Crawford   DOB: 1961-09-24   61 y.o. Male  MRN: [id number] Visit Date: 08/11/2022  Today's healthcare provider: [name], MD   Chief Complaint  Patient presents with   Follow-up Joint Pain   Subjective    HPI  Joint Pain, follow-Up: Patient here for 4 weeks follow up arthralgias for which has been present for  1  months. Pain is located in  both ankle(s).  Changes from last visit include referral to rheumatology.   Patient presents for follow up today with report of feeling much better.   Referral was sent to [facility]. Patient scheduled for 08/19/2022.   Medications: Outpatient Medications Prior to Visit  Medication Sig   amLODipine (NORVASC) 5 MG tablet TAKE 1 TABLET BY MOUTH DAILY   aspirin EC 81 MG tablet Take 81 mg by mouth daily.   Buprenorphine HCl (BELBUCA) 600 MCG FILM Place 1 Film inside cheek every 12 (twelve) hours.   desonide (DESOWEN) 0.05 % ointment Apply topically 3 (three) times a week. Apply topically on Mondays, Wednesdays, and Fridays. (Patient taking differently: Apply 1 application  topically daily as needed (dermatitis).)   meloxicam (MOBIC) 7.5 MG tablet Take 1 tablet (7.5 mg total) by mouth daily.   MULTIPLE VITAMINS PO Take 1 tablet by mouth daily.    rosuvastatin (CRESTOR) 40 MG tablet TAKE 1 TABLET BY MOUTH  DAILY   tadalafil (CIALIS) 5 MG tablet TAKE 1 TABLET BY MOUTH DAILY   testosterone cypionate (DEPOTESTOSTERONE CYPIONATE) 200 MG/ML injection INJECT 1ML INTO THE MUSCLE EVERY 14 DAYS**CAN TRY TO GO TO 18 DAYS**   traMADol (ULTRAM) 50 MG tablet Take 1 tablet (50 mg total) by mouth every 8 (eight) hours as needed for  severe pain. Month last 30 days.   vitamin B-12 (CYANOCOBALAMIN) 1000 MCG tablet Take 1,000 mcg by mouth daily.   zolpidem (AMBIEN CR) 12.5 MG CR tablet TAKE ONE TABLET BY MOUTH AT BEDTIME AS NEEDED FOR SLEEP   No facility-administered medications prior to visit.    Review of Systems     Objective    BP 134/83 (BP Location: Right Arm, Patient Position: Sitting, Cuff Size: Normal)   Pulse 94   Temp 98.6 F (37 C) (Oral)   Resp 16   Wt 159 lb 12.8 oz (72.5 kg)   BMI 22.29 kg/m    Physical Exam Cardiovascular:     Pulses:          Dorsalis pedis pulses are 2+ on the right side and 2+ on the left side.       Posterior tibial pulses are 2+ on the right side and 2+ on the left side.  Musculoskeletal:        General: No swelling, tenderness, deformity or signs of injury. Normal range of motion.     Right lower leg: No edema.     Left lower leg: No edema.     Right foot: Normal range of motion. No deformity.     Left foot: Normal range of motion. No deformity.  Feet:     Right foot:     Skin integrity: Skin integrity normal. No ulcer, blister, skin breakdown, erythema or warmth.     Toenail Condition: Right toenails  are normal.     Left foot:     Skin integrity: Skin integrity normal. No ulcer, blister, skin breakdown, erythema or warmth.     Toenail Condition: Left toenails are normal.  Skin:    General: Skin is warm and dry.     Findings: No bruising, erythema or rash.     No results found for any visits on 08/11/22.  Assessment & Plan     Problem List Items Addressed This Visit       Other   Arthralgia of ankle, left    Improved  Most days has not pain at all after anti-inflammatory (mobic)  Has appt scheduled 08/19/22 with rheumatology  Encouraged patient to keep this appt  Will follow up in 6 months       Arthralgia of ankle, right    Improved  Most days has not pain at all after anti-inflammatory (mobic)  Has appt scheduled 08/19/22 with rheumatology   Encouraged patient to keep this appt  Will follow up in 6 months       Need for immunization against influenza - Primary    Influenza vaccine was administered today  Patient tolerated well        Relevant Orders   Flu Vaccine QUAD 6+ mos PF IM (Fluarix Quad PF) (Completed)   Healthcare maintenance    Influenza vaccine administered today  Recommended Shingles vaccine, patient plans to schedule this in office in the next few months          Return in about 6 months (around 02/09/2023) for CPE.     I, Shawn Foster, MD, have reviewed all documentation for this visit. The documentation on 08/11/22 for the exam, diagnosis, procedures, and orders are all accurate and complete.  Portions of this information were initially documented by the CMA and reviewed by me for thoroughness and accuracy.     Shawn Foster, MD  [facility] [phone] (phone) [phone] (fax)  [place]

## 2022-08-11 NOTE — Assessment & Plan Note (Signed)
Improved  Most days has not pain at all after anti-inflammatory (mobic)  Has appt scheduled 08/19/22 with rheumatology  Encouraged patient to keep this appt  Will follow up in 6 months

## 2022-08-11 NOTE — Telephone Encounter (Signed)
Please clarify if patient still needs RX for gabapentin '400mg'$  TID.   If so,please send refill to pharmacy for 90 day supply.   [name], MD  [facility]  [phone]

## 2022-08-12 NOTE — Telephone Encounter (Signed)
Acknowledged.   [name], MD  [facility]  [phone]

## 2022-08-12 NOTE — Telephone Encounter (Addendum)
FYIPer patient not taking Gabapentin.reports he had really bad side effect from it and we can take it off his list please and he will let the pharmacy know.

## 2022-08-18 NOTE — Progress Notes (Signed)
Office Visit Note  Patient: Shawn Crawford             Date of Birth: [birth year]           MRN: [id number]             PCP: [name], MD Referring: Donnal Moat* Visit Date: 08/19/2022  Subjective:   History of Present Illness: Shawn Crawford is a 60 y.o. male here for evaluation of foot and ankle pain and swelling with positive ANA.  Symptoms started abruptly while vacationing in blowing [place] with cute onset of pain and swelling first in his left ankle that extended to involve the left knee and then bilateral ankles and knees.  This worsened and then acutely developed a severe stiffness or weakness feeling unable to stand and walk normally or fully extend his knees.  This prompted him to see the clinic where he had laboratory tests showing a positive ANA x-rays with significant ankle soft tissue swelling otherwise unremarkable.  He had IM Toradol treatment and has been taking meloxicam symptoms have almost completely resolved by this time still has just a little bit of soreness some of the time but no swelling and no difficulty moving. He did not recall any preceding events except he sustained a minor left shin wound from a pressure washer a few days prior and did have some localized erythema at that site.  He was not doing any particular hiking or strenuous outdoor activity.  He did not notice any insect bites did not have any prepared food exposures.  He denied any fevers, chills, skin rashes, eye irritation, or urinary symptoms. He denies any new problems with oral nasal ulcers, lymphadenopathy, or skin rashes.  He has noticed some pallor discoloration affecting the fingers and toes a little bit more than usual.  He had testing for peripheral arterial disease earlier this year with good pressures and triphasic waveform.  Labs reviewed 05/2022 ANA pos dsDNA, Sm, SSA, SSB neg RNP 1.8 ESR 13  Activities of Daily Living:  Patient reports morning stiffness for 0  minutes.   Patient Denies nocturnal pain.  Difficulty dressing/grooming: Denies Difficulty climbing stairs: Denies Difficulty getting out of chair: Denies Difficulty using hands for taps, buttons, cutlery, and/or writing: Denies  Review of Systems  Constitutional:  Negative for fatigue.  HENT:  Negative for mouth sores and mouth dryness.   Eyes:  Negative for dryness.  Respiratory:  Negative for shortness of breath.   Cardiovascular:  Negative for chest pain and palpitations.  Gastrointestinal:  Negative for blood in stool, constipation and diarrhea.  Endocrine: Negative for increased urination.  Genitourinary:  Negative for involuntary urination.  Musculoskeletal:  Negative for joint pain, gait problem, joint pain, joint swelling, myalgias, muscle weakness, morning stiffness, muscle tenderness and myalgias.  Skin:  Negative for color change, rash and sensitivity to sunlight.  Allergic/Immunologic: Negative for susceptible to infections.  Neurological:  Negative for dizziness and headaches.  Hematological:  Negative for swollen glands.  Psychiatric/Behavioral:  Negative for depressed mood and sleep disturbance. The patient is not nervous/anxious.     PMFS History:  Patient Active Problem List   Diagnosis Date Noted   Need for immunization against influenza 08/11/2022   Healthcare maintenance 08/11/2022   Joint swelling of lower leg 07/10/2022   Arthralgia of ankle, left 07/10/2022   Arthralgia of ankle, right 07/10/2022   Neuroforaminal stenosis of lumbar spine 04/29/2022   Spinal stenosis, lumbar region, with neurogenic claudication 04/29/2022  Right inguinal pain 12/27/2021   Right inguinal hernia 12/27/2021   Leg pain 12/27/2021   SBO (small bowel obstruction) ([place]) 06/14/2021   Special screening for malignant neoplasms, colon    Benign neoplasm of descending colon    Diverticulosis of large intestine without diverticulitis    Anal fissure s/p partial internal anal  sphincterotomy 11/11/2018    Melanosis of colon    Internal hemorrhoids    Lumbar degenerative disc disease 04/06/2018   Piriformis muscle pain 04/06/2018   Chronic pain syndrome 10/22/2017   Chronic midline low back pain without sciatica 10/22/2017   Lumbar facet arthropathy 10/22/2017   Hyperglycemia 05/30/2015   Abnormal blood sugar 05/10/2015   Arteriosclerosis of coronary artery 05/10/2015   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 Moderate to severe atypia, lateral margin involved. Excised 11/07/2014, margins free.   Dysplastic nevus 07/09/2015   Left lat. epigastric/costal margin. Moderate aypia, limited margins free.    Hx of cardiac catheterization    Hyperlipidemia    Hypertension     Family History  Problem Relation Age of Onset   Hyperlipidemia Mother    Diabetes Father    Hypertension Father    Thyroid cancer Sister    Colon cancer Sister    Healthy Son    Healthy Son    Past Surgical History:  Procedure Laterality Date   APPENDECTOMY     CARDIAC CATHETERIZATION     COLONOSCOPY WITH PROPOFOL N/A 06/24/2018   Procedure: COLONOSCOPY WITH PROPOFOL;  Surgeon: Virgel Manifold, MD;  Location: [facility];  Service: Endoscopy;  Laterality: N/A;   CORONARY ARTERY BYPASS GRAFT     ESOPHAGOGASTRODUODENOSCOPY (EGD) WITH PROPOFOL N/A 06/24/2018   Procedure: ESOPHAGOGASTRODUODENOSCOPY (EGD) WITH PROPOFOL;  Surgeon: Virgel Manifold, MD;  Location: [facility];  Service: Endoscopy;  Laterality: N/A;   EVALUATION UNDER ANESTHESIA WITH HEMORRHOIDECTOMY N/A 11/11/2018   Procedure: HEMORRHOIDAL  LIGATION/PEXY, PARTIAL LATERAL SPINCTEROTOMY;  Surgeon: Michael Boston, MD;  Location: [facility];  Service: General;  Laterality: N/A;   FINGER SURGERY Left    5th digit, tendon repair   HERNIA REPAIR     INSERTION OF MESH  02/05/2022   Procedure: INSERTION OF MESH;  Surgeon: [name], MD;  Location: [facility];  Service: General;;   Social History   Social History Narrative   Not on file   Immunization History  Administered Date(s) Administered   Influenza,inj,Quad PF,6+ Mos 05/30/2015, 06/16/2016, 05/27/2017, 08/04/2018, 06/22/2019, 07/19/2020, 06/16/2021, 08/11/2022   Moderna Sars-Covid-2 Vaccination 08/13/2020   Pneumococcal Conjugate-13 08/21/2015   Td 04/13/2019   Tdap 12/04/2008     Objective: Vital Signs: BP 127/82 (BP Location: Right Arm, Patient Position: Sitting, Cuff Size: Normal)   Pulse 92   Resp 17   Ht 5' 11" (1.803 m)   Wt 161 lb 3.2 oz (73.1 kg)   BMI 22.48 kg/m    Physical Exam HENT:     Mouth/Throat:     Mouth: Mucous membranes are moist.     Pharynx: Oropharynx is clear.  Eyes:     Conjunctiva/sclera: Conjunctivae normal.  Cardiovascular:     Rate  and Rhythm: Normal rate and regular rhythm.  Pulmonary:     Effort: Pulmonary effort is normal.     Breath sounds: Normal breath sounds.  Lymphadenopathy:     Cervical: No cervical adenopathy.  Skin:    General: Skin is warm and dry.     Findings: No rash.  Neurological:     Mental Status: He is alert.     Motor: No weakness.     Gait: Gait normal.     Deep Tendon Reflexes: Reflexes normal.  Psychiatric:        Mood and Affect: Mood normal.      Musculoskeletal Exam:  Shoulders full ROM no tenderness or swelling Elbows full ROM no tenderness or swelling Wrists full ROM no tenderness or swelling Fingers full ROM no tenderness or swelling, left 5th finger partial contracture Knees full ROM no tenderness or swelling Ankles full ROM no tenderness or swelling MTPs full ROM no tenderness or  swelling   Investigation: No additional findings.  Imaging: No results found.  Recent Labs: Lab Results  Component Value Date   WBC 7.2 06/23/2022   HGB 14.8 06/23/2022   PLT 152 06/23/2022   NA 138 06/23/2022   K 4.8 06/23/2022   CL 97 06/23/2022   CO2 21 06/23/2022   GLUCOSE 148 (H) 06/23/2022   BUN 13 06/23/2022   CREATININE 1.25 06/23/2022   BILITOT 0.3 01/28/2022   ALKPHOS 84 01/28/2022   AST 23 01/28/2022   ALT 19 01/28/2022   PROT 6.7 01/28/2022   ALBUMIN 4.4 06/23/2022   CALCIUM 9.2 06/23/2022   GFRAA 96 04/18/2020    Speciality Comments: No specialty comments available.  Procedures:  No procedures performed Allergies: Penicillins   Assessment / Plan:     Visit Diagnoses: Positive ANA (antinuclear antibody) - Plan: Sedimentation rate, C-reactive protein, RNP Antibody, C3 and C4  Lab results showing positive ANA with soft tissue swelling on exam and imaging at the initial evaluation.  Symptoms have pretty much completely resolved no abnormalities on exam today.  It is possible this represents a reactive process may be provoked by a preceding superficial trauma or some exposure though he does not have any thing definite from the history.  Will recheck serum inflammatory markers today also rechecking the RNP antibody titer and serum complements.  If lab results are all reassuring today would only recommend follow-up as needed if there is evidence of ongoing inflammation or upward trend in antibody titer would not start DMARD treatment today but needs follow-up for observation or if there is a new worsening.  Arthralgia of ankle, right Arthralgia of ankle, left Pain in both lower extremities  Symptoms are mostly resolved at this time minor pain that is doing well with NSAIDs as needed and no visible swelling or erythema on exam today.  Orders: Orders Placed This Encounter  Procedures   Sedimentation rate   C-reactive protein   RNP Antibody   C3 and C4   No  orders of the defined types were placed in this encounter.    Follow-Up Instructions: Return if symptoms worsen or fail to improve.   Collier Salina, MD  Note - This record has been created using [employer].  Chart creation errors have been sought, but may not always  have been located. Such creation errors do not reflect on  the standard of medical care.

## 2022-08-19 ENCOUNTER — Ambulatory Visit: Payer: No Typology Code available for payment source | Attending: Internal Medicine | Admitting: Internal Medicine

## 2022-08-19 ENCOUNTER — Encounter: Payer: Self-pay | Admitting: Internal Medicine

## 2022-08-19 VITALS — BP 127/82 | HR 92 | Resp 17 | Ht 71.0 in | Wt 161.2 lb

## 2022-08-19 DIAGNOSIS — M25571 Pain in right ankle and joints of right foot: Secondary | ICD-10-CM | POA: Diagnosis not present

## 2022-08-19 DIAGNOSIS — M79605 Pain in left leg: Secondary | ICD-10-CM

## 2022-08-19 DIAGNOSIS — M25572 Pain in left ankle and joints of left foot: Secondary | ICD-10-CM

## 2022-08-19 DIAGNOSIS — R768 Other specified abnormal immunological findings in serum: Secondary | ICD-10-CM

## 2022-08-19 DIAGNOSIS — M79604 Pain in right leg: Secondary | ICD-10-CM

## 2022-08-20 LAB — SEDIMENTATION RATE: Sed Rate: 6 mm/h (ref 0–20)

## 2022-08-20 LAB — RNP ANTIBODY: Ribonucleic Protein(ENA) Antibody, IgG: 2.4 AI — AB

## 2022-08-20 LAB — C-REACTIVE PROTEIN: CRP: 6 mg/L (ref <8.0)

## 2022-08-20 LAB — C3 AND C4
C3 Complement: 129 mg/dL (ref 82–185)
C4 Complement: 44 mg/dL (ref 15–53)

## 2022-08-22 NOTE — Progress Notes (Signed)
Lab test for inflammation are normal which is reassuring.  However his RNP antibody test is slightly increased from 1.8 when they checked originally up to 2.4 today.  This is still a low positive and clinically I do not see anything for which we need to start new medication right now.  Because this has increased over time I recommend we schedule a follow-up for monitoring and reassessing this in about 6 months or he can call to schedule sooner if develops another episode of worsening symptoms.

## 2022-08-26 ENCOUNTER — Encounter: Payer: Self-pay | Admitting: Student in an Organized Health Care Education/Training Program

## 2022-08-26 ENCOUNTER — Ambulatory Visit
Payer: 59 | Attending: Student in an Organized Health Care Education/Training Program | Admitting: Student in an Organized Health Care Education/Training Program

## 2022-08-26 DIAGNOSIS — M48061 Spinal stenosis, lumbar region without neurogenic claudication: Secondary | ICD-10-CM | POA: Insufficient documentation

## 2022-08-26 DIAGNOSIS — G8929 Other chronic pain: Secondary | ICD-10-CM | POA: Insufficient documentation

## 2022-08-26 DIAGNOSIS — M5416 Radiculopathy, lumbar region: Secondary | ICD-10-CM | POA: Insufficient documentation

## 2022-08-26 DIAGNOSIS — G894 Chronic pain syndrome: Secondary | ICD-10-CM | POA: Diagnosis present

## 2022-08-26 DIAGNOSIS — M48062 Spinal stenosis, lumbar region with neurogenic claudication: Secondary | ICD-10-CM | POA: Diagnosis present

## 2022-08-26 MED ORDER — TRAMADOL HCL 50 MG PO TABS: 50.0000 mg | ORAL_TABLET | Freq: Three times a day (TID) | ORAL | 2 refills | Status: DC | PRN

## 2022-08-26 MED ORDER — BELBUCA 600 MCG BU FILM: 1.0000 | ORAL_FILM | Freq: Two times a day (BID) | BUCCAL | 2 refills | Status: DC

## 2022-08-26 NOTE — Progress Notes (Signed)
PROVIDER NOTE: Information contained herein reflects review and annotations entered in association with encounter. Interpretation of such information and data should be left to medically-trained personnel. Information provided to patient can be located elsewhere in the medical record under "Patient Instructions". Document created using STT-dictation technology, any transcriptional errors that may result from process are unintentional.    Patient: Shawn Crawford  Service Category: E/M  Provider: Gillis Santa, MD  DOB: Apr 06, 1961  DOS: 08/26/2022  Referring Provider: Jerrol Banana.,*  MRN: [id number]  Specialty: Interventional Pain Management  PCP: [name], MD  Type: Established Patient  Setting: Ambulatory outpatient    Location: Office  Delivery: Face-to-face     HPI  [name], a 61 y.o. year old male, is here today because of his No primary diagnosis found.. Shawn Crawford primary complain today is Back Pain (lower) Last encounter: My last encounter with him was on 07/02/22 Pertinent problems: Mr. Mccombie has Arteriosclerosis of coronary artery; Chronic radicular lumbar pain; Benign essential HTN; Chronic pain syndrome; Chronic midline low back pain without sciatica; Lumbar facet arthropathy; Lumbar degenerative disc disease; and Piriformis muscle pain on their pertinent problem list. Pain Assessment: Severity of Chronic pain is reported as a 2 /10. Location: Back Lower/pain radiaities down both leg to his feet. Onset: More than a month ago. Quality: Aching, Constant, Burning. Timing: Constant. Modifying factor(s): laying down with a pillow between my leg, meds. Vitals:  height is _0  (1.803 m) and weight is 161 lb (73 kg). His temperature is 97.1 F (36.2 C) (abnormal). His blood pressure is 134/83 and his pulse is 77. His oxygen saturation is 100%.   Reason for encounter: both, medication management and post-procedure evaluation and assessment   Post-procedure  evaluation   Type: Lumbar epidural steroid injection (LESI) (interlaminar) #2    Laterality: Midline   Level:  L4-5 Level.  Imaging: Fluoroscopic guidance         Anesthesia: Local anesthesia (1-2% Lidocaine) DOS: 07/02/2022  Performed by: Gillis Santa, MD  Purpose: Diagnostic/Therapeutic Indications: Lumbar radicular pain of intraspinal etiology of more than 4 weeks that has failed to respond to conservative therapy and is severe enough to impact quality of life or function. 1. Neuroforaminal stenosis of lumbar spine   2. Chronic radicular lumbar pain   3. Spinal stenosis, lumbar region, with neurogenic claudication   4. Lumbar radiculopathy   5. Chronic pain syndrome    NAS-11 Pain score:   Pre-procedure: 3 /10   Post-procedure: 0-No pain/10      Effectiveness:  Initial hour after procedure: 100 %  Subsequent 4-6 hours post-procedure: 100 %  Analgesia past initial 6 hours: 100 %  Ongoing improvement:  Analgesic:  100% Function: Somewhat improved ROM: Somewhat improved   Pharmacotherapy Assessment  Analgesic: Belbuca 600 mcg BID, Tramadol 50 mg q8 hrs breakthrough pain    Monitoring: [place] PMP: PDMP reviewed during this encounter.       Pharmacotherapy: No side-effects or adverse reactions reported. Compliance: No problems identified. Effectiveness: Clinically acceptable.  Chauncey Fischer, RN  08/26/2022  8:45 AM  Sign when Signing Visit Nursing Pain Medication Assessment:  Safety precautions to be maintained throughout the outpatient stay will include: orient to surroundings, keep bed in low position, maintain call bell within reach at all times, provide assistance with transfer out of bed and ambulation.  Nursing Pain Medication Assessment:  Safety precautions to be maintained throughout the outpatient stay will include: orient to surroundings, keep bed in  low position, maintain call bell within reach at all times, provide assistance with transfer out of bed and  ambulation.  Medication Inspection Compliance: Pill count conducted under aseptic conditions, in front of the patient. Neither the pills nor the bottle was removed from the patient's sight at any time. Once count was completed pills were immediately returned to the patient in their original bottle.  Medication #1: Tramadol (Ultram) Pill/Patch Count:  49 of 90 pills remain Pill/Patch Appearance: Markings consistent with prescribed medication Bottle Appearance: Standard pharmacy container. Clearly labeled. Filled Date: 35 / 12 / 2023 Last Medication intake:  Today  Medication #2: Buprenorphine (Suboxone) Pill/Patch Count:  34 of 60 pills remain Pill/Patch Appearance: Markings consistent with prescribed medication Bottle Appearance: Standard pharmacy container. Clearly labeled. Filled Date: 44 / 12 / 2023 Last Medication intake:  Yesterday    UDS:  Summary  Date Value Ref Range Status  08/27/2021 Note  Final    Comment:    ==================================================================== Tox[employer] Select 13 (MW) ==================================================================== Test                             Result       Flag       Units  Drug Present and Declared for Prescription Verification   Buprenorphine                  13           EXPECTED   ng/mg creat   Norbuprenorphine               57           EXPECTED   ng/mg creat    Source of buprenorphine is a scheduled prescription medication.    Norbuprenorphine is an expected metabolite of buprenorphine.    Tramadol                       2960         EXPECTED   ng/mg creat   O-Desmethyltramadol            >3676        EXPECTED   ng/mg creat   N-Desmethyltramadol            485          EXPECTED   ng/mg creat    Source of tramadol is a prescription medication. O-desmethyltramadol    and N-desmethyltramadol are expected metabolites of tramadol.  ==================================================================== Test                       Result    Flag   Units      Ref Range   Creatinine              136              mg/dL      >=20 ==================================================================== Declared Medications:  The flagging and interpretation on this report are based on the  following declared medications.  Unexpected results may arise from  inaccuracies in the declared medications.   **Note: The testing scope of this panel includes these medications:   Tramadol (Ultram)   **Note: The testing scope of this panel does not include small to  moderate amounts of these reported medications:   Buprenorphine (Belbuca)   **Note: The testing scope of this panel does not include the  following reported medications:  Amlodipine (Norvasc)  Aspirin  Desonide (Desowen)  Ketoconazole (Nizoral)  Ondansetron (Zofran)  Rosuvastatin (Crestor)  Tadalafil (Cialis)  Testosterone  Zolpidem (Ambien) ==================================================================== For clinical consultation, please call [phone]. ====================================================================      ROS  Constitutional: Denies any fever or chills Gastrointestinal: No reported hemesis, hematochezia, vomiting, or acute GI distress Musculoskeletal:  Low back pain with radiation to bilateral legs in a dermatomal fashion, improved after LESI Neurological:  Paresthesias of bilateral legs, improved after LESI  Medication Review  Buprenorphine HCl, Multiple Vitamin, amLODipine, aspirin EC, cyanocobalamin, desonide, rosuvastatin, tadalafil, testosterone cypionate, traMADol, and zolpidem  History Review  Allergy: Shawn Crawford is allergic to penicillins. Drug: Shawn Crawford  reports no history of drug use. Alcohol:  reports current alcohol use. Tobacco:  reports that he has never smoked. He has never been exposed to tobacco smoke. He has never used smokeless tobacco. Social: Shawn Crawford  reports that he has never smoked. He  has never been exposed to tobacco smoke. He has never used smokeless tobacco. He reports current alcohol use. He reports that he does not use drugs. Medical:  has a past medical history of Actinic keratosis, Coronary artery disease, Dysplastic nevus (09/06/2014), Dysplastic nevus (07/09/2015), cardiac catheterization, Hyperlipidemia, and Hypertension. Surgical: Shawn Crawford  has a past surgical history that includes Coronary artery bypass graft; Hernia repair; Appendectomy; Cardiac catheterization; Colonoscopy with propofol (N/A, 06/24/2018); Esophagogastroduodenoscopy (egd) with propofol (N/A, 06/24/2018); Exam under anesthesia with hemorrhoidectomy (N/A, 11/11/2018); Insertion of mesh (02/05/2022); and Finger surgery (Left). Family: family history includes Colon cancer in his sister; Diabetes in his father; Healthy in his son and son; Hyperlipidemia in his mother; Hypertension in his father; Thyroid cancer in his sister.  Laboratory Chemistry Profile   Renal Lab Results  Component Value Date   BUN 13 06/23/2022   CREATININE 1.25 06/23/2022   BCR 10 06/23/2022   GFRAA 96 04/18/2020   GFRNONAA >60 01/29/2022    Hepatic Lab Results  Component Value Date   AST 23 01/28/2022   ALT 19 01/28/2022   ALBUMIN 4.4 06/23/2022   ALKPHOS 84 01/28/2022   LIPASE 23 06/14/2021    Electrolytes Lab Results  Component Value Date   NA 138 06/23/2022   K 4.8 06/23/2022   CL 97 06/23/2022   CALCIUM 9.2 06/23/2022   MG 2.0 01/28/2022   PHOS 2.7 (L) 06/23/2022    Bone Lab Results  Component Value Date   TESTOFREE 5.1 (L) 11/09/2019   TESTOSTERONE 263 (L) 08/16/2021    Inflammation (CRP: Acute Phase) (ESR: Chronic Phase) Lab Results  Component Value Date   CRP 6.0 08/19/2022   ESRSEDRATE 6 08/19/2022         Note: Above Lab results reviewed.  Recent Imaging Review  DG Ankle Complete Right CLINICAL DATA:  Joint pain with swelling.  EXAM: RIGHT ANKLE - COMPLETE 3+ VIEW  COMPARISON:  None  Available.  FINDINGS: There is diffuse soft tissue swelling of the ankle. There is no acute fracture or dislocation. Joint spaces are well maintained. No vertebral erosions or periosteal reaction. Small posterior calcaneal spur is present. Peripheral vascular calcifications are present.  IMPRESSION: Diffuse soft tissue swelling of the ankle. No acute osseous abnormality.  Electronically Signed   By: Ronney Asters M.D.   On: 07/12/2022 18:38 DG Ankle Complete Left CLINICAL DATA:  Joint pain with swelling.  EXAM: LEFT ANKLE COMPLETE - 3+ VIEW  COMPARISON:  None Available.  FINDINGS: There is no acute fracture or dislocation. There is no  cortical erosion or periosteal reaction. Bony mineralization is within normal limits. There is a well corticated density adjacent to the tip of the medial malleolus likely related to old injury. A posterior calcaneal spur is present. Joint spaces appear well maintained. Minimal peripheral vascular calcifications are present. Soft tissues are otherwise within normal limits.  IMPRESSION: 1. No acute fracture or dislocation. 2. Minimal peripheral vascular calcifications.  Electronically Signed   By: Ronney Asters M.D.   On: 07/12/2022 18:37   CLINICAL DATA:  Neurogenic claudication. Lumbar radiculopathy. Worsening chronic low back pain radiating down both legs.   EXAM: MRI LUMBAR SPINE WITHOUT CONTRAST   TECHNIQUE: Multiplanar, multisequence MR imaging of the lumbar spine was performed. No intravenous contrast was administered.   COMPARISON:  Lumbar spine MRI 03/03/2014   FINDINGS: Segmentation:  Standard.   Alignment: Mild straightening of the normal lumbar lordosis. No listhesis.   Vertebrae: No fracture, suspicious marrow lesion, or significant marrow edema.   Conus medullaris and cauda equina: Conus extends to the L1 level. Conus and cauda equina appear normal.   Paraspinal and other soft tissues: Small left renal cysts  measuring up to 1.3 cm.   Disc levels:   T12-L1 and L1-2: Negative.   L2-3: Trace disc bulging and mild facet hypertrophy without stenosis, unchanged.   L3-4: New disc desiccation and mild disc space narrowing. Disc bulging and mild facet and ligamentum flavum hypertrophy result in mild spinal stenosis, mild left greater than right lateral recess stenosis, and mild left neural foraminal stenosis, mildly progressed from prior.   L4-5: Disc desiccation and mild disc space narrowing. Disc bulging and mild facet hypertrophy result in mild right lateral recess stenosis and mild left neural foraminal stenosis, stable to slightly progressed. New left foraminal annular fissure.   L5-S1: Negative.   IMPRESSION: 1. Mildly progressive disc degeneration at L3-4 with mild spinal, lateral recess, and left neural foraminal stenosis. 2. Mild lateral recess and neural foraminal stenosis at L4-5, stable to slightly progressed.     Electronically Signed   By: Logan Bores M.D.   On: 03/03/2022 16:22 Lumbar MRI reviewed with patient in detail.  Note: Reviewed        Physical Exam  General appearance: Well nourished, well developed, and well hydrated. In no apparent acute distress Mental status: Alert, oriented x 3 (person, place, & time)       Respiratory: No evidence of acute respiratory distress Eyes: PERLA Vitals: BP 134/83   Pulse 77   Temp (!) 97.1 F (36.2 C)   Ht _0  (1.803 m)   Wt 161 lb (73 kg)   SpO2 100%   BMI 22.45 kg/m  BMI: Estimated body mass index is 22.45 kg/m as calculated from the following:   Height as of this encounter: _1  (1.803 m).   Weight as of this encounter: 161 lb (73 kg). Ideal: Ideal body weight: 75.3 kg (166 lb 0.1 oz)  Lumbar Spine Area Exam  Skin & Axial Inspection: No masses, redness, or swelling Alignment: Symmetrical Functional ROM: Unrestricted ROM       Stability: No instability detected Muscle Tone/Strength: Functionally intact.  No obvious neuro-muscular anomalies detected. Sensory (Neurological): Dermatomal pain pattern and neurogenic, improved after LESI  Gait & Posture Assessment  Ambulation: Unassisted Gait: Relatively normal for age and body habitus Posture: WNL  Lower Extremity Exam    Side: Right lower extremity  Side: Left lower extremity  Stability: No instability observed  Stability: No instability observed          Skin & Extremity Inspection: Skin color, temperature, and hair growth are WNL. No peripheral edema or cyanosis. No masses, redness, swelling, asymmetry, or associated skin lesions. No contractures.  Skin & Extremity Inspection: Skin color, temperature, and hair growth are WNL. No peripheral edema or cyanosis. No masses, redness, swelling, asymmetry, or associated skin lesions. No contractures.  Functional ROM: Unrestricted ROM                  Functional ROM: Unrestricted ROM                  Muscle Tone/Strength: Functionally intact. No obvious neuro-muscular anomalies detected.  Muscle Tone/Strength: Functionally intact. No obvious neuro-muscular anomalies detected.  Sensory (Neurological): Neurogenic pain pattern        Sensory (Neurological): Neurogenic pain pattern        DTR: Patellar: deferred today Achilles: deferred today Plantar: deferred today  DTR: Patellar: deferred today Achilles: deferred today Plantar: deferred today  Palpation: No palpable anomalies  Palpation: No palpable anomalies    Assessment   Diagnosis Status  1. Lumbar radiculopathy   2. Chronic pain syndrome   3. Spinal stenosis, lumbar region, with neurogenic claudication   4. Chronic radicular lumbar pain   5. Neuroforaminal stenosis of lumbar spine     Improving Controlled Persistent   Updated Problems: No problems updated.   Plan of Care  Overall Shawn Crawford had a good response to his previous lumbar epidural steroid injection.  Endorses less radiating leg pain and is able to complete ADLs and  his work more comfortably.  As needed order placed below for repeat lumbar ESI should he have return of symptoms.  Otherwise we will refill his tramadol and belbuca as below.  Renew annual urine toxicology screen for medication compliance and monitoring.  1. Lumbar radiculopathy - Buprenorphine HCl (BELBUCA) 600 MCG FILM; Place 1 Film inside cheek every 12 (twelve) hours.  Dispense: 60 each; Refill: 2 - traMADol (ULTRAM) 50 MG tablet; Take 1 tablet (50 mg total) by mouth every 8 (eight) hours as needed for severe pain. Month last 30 days.  Dispense: 90 tablet; Refill: 2 - Lumbar Epidural Injection; Standing  2. Chronic pain syndrome - Buprenorphine HCl (BELBUCA) 600 MCG FILM; Place 1 Film inside cheek every 12 (twelve) hours.  Dispense: 60 each; Refill: 2 - traMADol (ULTRAM) 50 MG tablet; Take 1 tablet (50 mg total) by mouth every 8 (eight) hours as needed for severe pain. Month last 30 days.  Dispense: 90 tablet; Refill: 2 - ToxASSURE Select 13 (MW), Urine  3. Spinal stenosis, lumbar region, with neurogenic claudication - Buprenorphine HCl (BELBUCA) 600 MCG FILM; Place 1 Film inside cheek every 12 (twelve) hours.  Dispense: 60 each; Refill: 2 - traMADol (ULTRAM) 50 MG tablet; Take 1 tablet (50 mg total) by mouth every 8 (eight) hours as needed for severe pain. Month last 30 days.  Dispense: 90 tablet; Refill: 2 - Lumbar Epidural Injection; Standing  4. Chronic radicular lumbar pain - Buprenorphine HCl (BELBUCA) 600 MCG FILM; Place 1 Film inside cheek every 12 (twelve) hours.  Dispense: 60 each; Refill: 2 - traMADol (ULTRAM) 50 MG tablet; Take 1 tablet (50 mg total) by mouth every 8 (eight) hours as needed for severe pain. Month last 30 days.  Dispense: 90 tablet; Refill: 2 - Lumbar Epidural Injection; Standing  5. Neuroforaminal stenosis of lumbar spine - Buprenorphine HCl (BELBUCA) 600 MCG FILM; Place  1 Film inside cheek every 12 (twelve) hours.  Dispense: 60 each; Refill: 2 - traMADol  (ULTRAM) 50 MG tablet; Take 1 tablet (50 mg total) by mouth every 8 (eight) hours as needed for severe pain. Month last 30 days.  Dispense: 90 tablet; Refill: 2 - Lumbar Epidural Injection; Standing     Orders:  Orders Placed This Encounter  Procedures   Lumbar Epidural Injection    Standing Status:   Standing    Number of Occurrences:   2    Standing Expiration Date:   12/25/2022    Scheduling Instructions:     Procedure: Interlaminar Lumbar Epidural Steroid injection (LESI)            Laterality: Midline     Sedation: Patient's choice.     Timeframe: PRN    Order Specific Question:   Where will this procedure be performed?    Answer:   [facility]   ToxASSURE Select 13 (MW), Urine    Volume: 30 ml(s). Minimum 3 ml of urine is needed. Document temperature of fresh sample. Indications: Long term (current) use of opiate analgesic [phone])    Order Specific Question:   Release to patient    Answer:   Immediate   Follow-up plan:   Return in about 3 months (around 11/26/2022) for Medication Management, in person.    Recent Visits Date Type Provider Dept  07/02/22 Procedure visit Gillis Santa, MD [facility]  06/03/22 Office Visit Gillis Santa, MD [facility]  Showing recent visits within past 90 days and meeting all other requirements Today's Visits Date Type Provider Dept  08/26/22 Office Visit Gillis Santa, MD [facility]  Showing today's visits and meeting all other requirements Future Appointments No visits were found meeting these conditions. Showing future appointments within next 90 days and meeting all other requirements  I discussed the assessment and treatment plan with the patient. The patient was provided an opportunity to ask questions and all were answered. The patient agreed with the plan and demonstrated an understanding of the instructions.  Patient advised to call back or seek an in-person evaluation if the symptoms  or condition worsens.  Duration of encounter: 57mnutes.  Total time on encounter, as per AMA guidelines included both the face-to-face and non-face-to-face time personally spent by the physician and/or other qualified health care professional(s) on the day of the encounter (includes time in activities that require the physician or other qualified health care professional and does not include time in activities normally performed by clinical staff). Physician's time may include the following activities when performed: preparing to see the patient (eg, review of tests, pre-charting review of records) obtaining and/or reviewing separately obtained history performing a medically appropriate examination and/or evaluation counseling and educating the patient/family/caregiver ordering medications, tests, or procedures referring and communicating with other health care professionals (when not separately reported) documenting clinical information in the electronic or other health record independently interpreting results (not separately reported) and communicating results to the patient/ family/caregiver care coordination (not separately reported)  Note by: BGillis Santa MD Date: 08/26/2022; Time: 8:58 AM

## 2022-08-26 NOTE — Progress Notes (Signed)
Nursing Pain Medication Assessment:  Safety precautions to be maintained throughout the outpatient stay will include: orient to surroundings, keep bed in low position, maintain call bell within reach at all times, provide assistance with transfer out of bed and ambulation.  Nursing Pain Medication Assessment:  Safety precautions to be maintained throughout the outpatient stay will include: orient to surroundings, keep bed in low position, maintain call bell within reach at all times, provide assistance with transfer out of bed and ambulation.  Medication Inspection Compliance: Pill count conducted under aseptic conditions, in front of the patient. Neither the pills nor the bottle was removed from the patient's sight at any time. Once count was completed pills were immediately returned to the patient in their original bottle.  Medication #1: Tramadol (Ultram) Pill/Patch Count:  49 of 90 pills remain Pill/Patch Appearance: Markings consistent with prescribed medication Bottle Appearance: Standard pharmacy container. Clearly labeled. Filled Date: 88 / 12 / 2023 Last Medication intake:  Today  Medication #2: Buprenorphine (Suboxone) Pill/Patch Count:  34 of 60 pills remain Pill/Patch Appearance: Markings consistent with prescribed medication Bottle Appearance: Standard pharmacy container. Clearly labeled. Filled Date: 46 / 12 / 2023 Last Medication intake:  Yesterday

## 2022-08-28 LAB — TOXASSURE SELECT 13 (MW), URINE

## 2022-09-02 ENCOUNTER — Encounter: Payer: 59 | Admitting: Student in an Organized Health Care Education/Training Program

## 2022-09-09 ENCOUNTER — Other Ambulatory Visit: Payer: Self-pay | Admitting: Family Medicine

## 2022-09-09 DIAGNOSIS — R7989 Other specified abnormal findings of blood chemistry: Secondary | ICD-10-CM

## 2022-09-09 NOTE — Telephone Encounter (Signed)
[facility] faxed refill request for the following medications:  testosterone cypionate (DEPOTESTOSTERONE CYPIONATE) 200 MG/ML injection    gabapentin (NEURONTIN) capsule 400 mg      Please advise.

## 2022-09-10 MED ORDER — TESTOSTERONE CYPIONATE 200 MG/ML IM SOLN: INTRAMUSCULAR | 2 refills | Status: DC

## 2022-11-03 ENCOUNTER — Ambulatory Visit: Payer: 59 | Admitting: Dermatology

## 2022-11-03 ENCOUNTER — Telehealth: Payer: Self-pay | Admitting: Student in an Organized Health Care Education/Training Program

## 2022-11-03 VITALS — BP 116/68

## 2022-11-03 DIAGNOSIS — L578 Other skin changes due to chronic exposure to nonionizing radiation: Secondary | ICD-10-CM | POA: Diagnosis not present

## 2022-11-03 DIAGNOSIS — Z1283 Encounter for screening for malignant neoplasm of skin: Secondary | ICD-10-CM

## 2022-11-03 DIAGNOSIS — Z86018 Personal history of other benign neoplasm: Secondary | ICD-10-CM | POA: Diagnosis not present

## 2022-11-03 DIAGNOSIS — L814 Other melanin hyperpigmentation: Secondary | ICD-10-CM

## 2022-11-03 DIAGNOSIS — L821 Other seborrheic keratosis: Secondary | ICD-10-CM

## 2022-11-03 DIAGNOSIS — L810 Postinflammatory hyperpigmentation: Secondary | ICD-10-CM

## 2022-11-03 DIAGNOSIS — D1801 Hemangioma of skin and subcutaneous tissue: Secondary | ICD-10-CM

## 2022-11-03 DIAGNOSIS — D229 Melanocytic nevi, unspecified: Secondary | ICD-10-CM

## 2022-11-03 NOTE — Patient Instructions (Signed)
Due to recent changes in healthcare laws, you may see results of your pathology and/or laboratory studies on MyChart before the doctors have had a chance to review them. We understand that in some cases there may be results that are confusing or concerning to you. Please understand that not all results are received at the same time and often the doctors may need to interpret multiple results in order to provide you with the best plan of care or course of treatment. Therefore, we ask that you please give us 2 business days to thoroughly review all your results before contacting the office for clarification. Should we see a critical lab result, you will be contacted sooner.   If You Need Anything After Your Visit  If you have any questions or concerns for your doctor, please call our main line at [phone] and press option 4 to reach your doctor's medical assistant. If no one answers, please leave a voicemail as directed and we will return your call as soon as possible. Messages left after 4 pm will be answered the following business day.   You may also send us a message via MyChart. We typically respond to MyChart messages within 1-2 business days.  For prescription refills, please ask your pharmacy to contact our office. Our fax number is [phone].  If you have an urgent issue when the clinic is closed that cannot wait until the next business day, you can page your doctor at the number below.    Please note that while we do our best to be available for urgent issues outside of office hours, we are not available 24/7.   If you have an urgent issue and are unable to reach us, you may choose to seek medical care at your doctor's office, retail clinic, urgent care center, or emergency room.  If you have a medical emergency, please immediately call 911 or go to the emergency department.  Pager Numbers  - Dr. Kowalski: [phone]  - Dr. Moye: [phone]  - Dr. Stewart:  [phone]  In the event of inclement weather, please call our main line at [phone] for an update on the status of any delays or closures.  Dermatology Medication Tips: Please keep the boxes that topical medications come in in order to help keep track of the instructions about where and how to use these. Pharmacies typically print the medication instructions only on the boxes and not directly on the medication tubes.   If your medication is too expensive, please contact our office at [phone] option 4 or send us a message through MyChart.   We are unable to tell what your co-pay for medications will be in advance as this is different depending on your insurance coverage. However, we may be able to find a substitute medication at lower cost or fill out paperwork to get insurance to cover a needed medication.   If a prior authorization is required to get your medication covered by your insurance company, please allow us 1-2 business days to complete this process.  Drug prices often vary depending on where the prescription is filled and some pharmacies may offer cheaper prices.  The website www.goodrx.com contains coupons for medications through different pharmacies. The prices here do not account for what the cost may be with help from insurance (it may be cheaper with your insurance), but the website can give you the price if you did not use any insurance.  - You can print the associated coupon and take it with   your prescription to the pharmacy.  - You may also stop by our office during regular business hours and pick up a GoodRx coupon card.  - If you need your prescription sent electronically to a different pharmacy, notify our office through [facility] MyChart or by phone at [phone] option 4.     Si Usted Necesita Algo Despus de Su Visita  Tambin puede enviarnos un mensaje a travs de MyChart. Por lo general respondemos a los mensajes de MyChart en el transcurso de 1 a 2  das hbiles.  Para renovar recetas, por favor pida a su farmacia que se ponga en contacto con nuestra oficina. Nuestro nmero de fax es el [phone].  Si tiene un asunto urgente cuando la clnica est cerrada y que no puede esperar hasta el siguiente da hbil, puede llamar/localizar a su doctor(a) al nmero que aparece a continuacin.   Por favor, tenga en cuenta que aunque hacemos todo lo posible para estar disponibles para asuntos urgentes fuera del horario de oficina, no estamos disponibles las 24 horas del da, los 7 das de la semana.   Si tiene un problema urgente y no puede comunicarse con nosotros, puede optar por buscar atencin mdica  en el consultorio de su doctor(a), en una clnica privada, en un centro de atencin urgente o en una sala de emergencias.  Si tiene una emergencia mdica, por favor llame inmediatamente al 911 o vaya a la sala de emergencias.  Nmeros de bper  - Dr. Kowalski: [phone]  - Dra. Moye: [phone]  - Dra. Stewart: [phone]  En caso de inclemencias del tiempo, por favor llame a nuestra lnea principal al [phone] para una actualizacin sobre el estado de cualquier retraso o cierre.  Consejos para la medicacin en dermatologa: Por favor, guarde las cajas en las que vienen los medicamentos de uso tpico para ayudarle a seguir las instrucciones sobre dnde y cmo usarlos. Las farmacias generalmente imprimen las instrucciones del medicamento slo en las cajas y no directamente en los tubos del medicamento.   Si su medicamento es muy caro, por favor, pngase en contacto con nuestra oficina llamando al [phone] y presione la opcin 4 o envenos un mensaje a travs de MyChart.   No podemos decirle cul ser su copago por los medicamentos por adelantado ya que esto es diferente dependiendo de la cobertura de su seguro. Sin embargo, es posible que podamos encontrar un medicamento sustituto a menor costo o llenar un formulario para que el  seguro cubra el medicamento que se considera necesario.   Si se requiere una autorizacin previa para que su compaa de seguros cubra su medicamento, por favor permtanos de 1 a 2 das hbiles para completar este proceso.  Los precios de los medicamentos varan con frecuencia dependiendo del lugar de dnde se surte la receta y alguna farmacias pueden ofrecer precios ms baratos.  El sitio web www.goodrx.com tiene cupones para medicamentos de diferentes farmacias. Los precios aqu no tienen en cuenta lo que podra costar con la ayuda del seguro (puede ser ms barato con su seguro), pero el sitio web puede darle el precio si no utiliz ningn seguro.  - Puede imprimir el cupn correspondiente y llevarlo con su receta a la farmacia.  - Tambin puede pasar por nuestra oficina durante el horario de atencin regular y recoger una tarjeta de cupones de GoodRx.  - Si necesita que su receta se enve electrnicamente a una farmacia diferente, informe a nuestra oficina a travs de MyChart de [facility]   o por telfono llamando al [phone] y presione la opcin 4.  

## 2022-11-03 NOTE — Progress Notes (Deleted)
   Follow-Up Visit   Subjective  Shawn Crawford is a 62 y.o. male who presents for the following: Annual Exam (History of dysplastic nevi - The patient presents for Total-Body Skin Exam (TBSE) for skin cancer screening and mole check.  The patient has spots, moles and lesions to be evaluated, some may be new or changing and the patient has concerns that these could be cancer./).    The following portions of the chart were reviewed this encounter and updated as appropriate:       Review of Systems:  No other skin or systemic complaints except as noted in HPI or Assessment and Plan.  Objective  Well appearing patient in no apparent distress; mood and affect are within normal limits.  A full examination was performed including scalp, head, eyes, ears, nose, lips, neck, chest, axillae, abdomen, back, buttocks, bilateral upper extremities, bilateral lower extremities, hands, feet, fingers, toes, fingernails, and toenails. All findings within normal limits unless otherwise noted below.    Assessment & Plan   History of Dysplastic Nevi - No evidence of recurrence today - Recommend regular full body skin exams - Recommend daily broad spectrum sunscreen SPF 30+ to sun-exposed areas, reapply every 2 hours as needed.  - Call if any new or changing lesions are noted between office visits  Lentigines - Scattered tan macules - Due to sun exposure - Benign-appearing, observe - Recommend daily broad spectrum sunscreen SPF 30+ to sun-exposed areas, reapply every 2 hours as needed. - Call for any changes  Seborrheic Keratoses - Stuck-on, waxy, tan-brown papules and/or plaques  - Benign-appearing - Discussed benign etiology and prognosis. - Observe - Call for any changes  Melanocytic Nevi - Tan-brown and/or pink-flesh-colored symmetric macules and papules - Benign appearing on exam today - Observation - Call clinic for new or changing moles - Recommend daily use of broad spectrum spf 30+  sunscreen to sun-exposed areas.   Hemangiomas - Red papules - Discussed benign nature - Observe - Call for any changes  Actinic Damage - Chronic condition, secondary to cumulative UV/sun exposure - diffuse scaly erythematous macules with underlying dyspigmentation - Recommend daily broad spectrum sunscreen SPF 30+ to sun-exposed areas, reapply every 2 hours as needed.  - Staying in the shade or wearing long sleeves, sun glasses (UVA+UVB protection) and wide brim hats (4-inch brim around the entire circumference of the hat) are also recommended for sun protection.  - Call for new or changing lesions.  Skin cancer screening performed today.  Postinflammatory hyperpigmentation Left medial pretibial  Secondary to pressure washer injury   Return in about 1 year (around 11/04/2023) for TBSE.  I, Ashok Cordia, CMA, am acting as scribe for Sarina Ser, MD .

## 2022-11-03 NOTE — Telephone Encounter (Signed)
Patient called stating he wants to go ahead with injections. It will need prior auth.

## 2022-11-03 NOTE — Progress Notes (Unsigned)
   Follow-Up Visit   Subjective  Shawn Crawford is a 62 y.o. male who presents for the following: Annual Exam (History of dysplastic nevi - The patient presents for Total-Body Skin Exam (TBSE) for skin cancer screening and mole check.  The patient has spots, moles and lesions to be evaluated, some may be new or changing and the patient has concerns that these could be cancer./).  The following portions of the chart were reviewed this encounter and updated as appropriate:   Tobacco  Allergies  Meds  Problems  Med Hx  Surg Hx  Fam Hx     Review of Systems:  No other skin or systemic complaints except as noted in HPI or Assessment and Plan.  Objective  Well appearing patient in no apparent distress; mood and affect are within normal limits.  A full examination was performed including scalp, head, eyes, ears, nose, lips, neck, chest, axillae, abdomen, back, buttocks, bilateral upper extremities, bilateral lower extremities, hands, feet, fingers, toes, fingernails, and toenails. All findings within normal limits unless otherwise noted below.   Assessment & Plan   History of Dysplastic Nevi - No evidence of recurrence today - Recommend regular full body skin exams - Recommend daily broad spectrum sunscreen SPF 30+ to sun-exposed areas, reapply every 2 hours as needed.  - Call if any new or changing lesions are noted between office visits  Lentigines - Scattered tan macules - Due to sun exposure - Benign-appearing, observe - Recommend daily broad spectrum sunscreen SPF 30+ to sun-exposed areas, reapply every 2 hours as needed. - Call for any changes  Seborrheic Keratoses - Stuck-on, waxy, tan-brown papules and/or plaques  - Benign-appearing - Discussed benign etiology and prognosis. - Observe - Call for any changes  Melanocytic Nevi - Tan-brown and/or pink-flesh-colored symmetric macules and papules - Benign appearing on exam today - Observation - Call clinic for new or  changing moles - Recommend daily use of broad spectrum spf 30+ sunscreen to sun-exposed areas.   Hemangiomas - Red papules - Discussed benign nature - Observe - Call for any changes  Actinic Damage - Chronic condition, secondary to cumulative UV/sun exposure - diffuse scaly erythematous macules with underlying dyspigmentation - Recommend daily broad spectrum sunscreen SPF 30+ to sun-exposed areas, reapply every 2 hours as needed.  - Staying in the shade or wearing long sleeves, sun glasses (UVA+UVB protection) and wide brim hats (4-inch brim around the entire circumference of the hat) are also recommended for sun protection.  - Call for new or changing lesions.  Skin cancer screening performed today.  Postinflammatory hyperpigmentation Left medial pretibial Secondary to pressure washer injury  Return in about 1 year (around 11/04/2023) for TBSE.  I, Ashok Cordia, CMA, am acting as scribe for Sarina Ser, MD . Documentation: I have reviewed the above documentation for accuracy and completeness, and I agree with the above.  Sarina Ser, MD

## 2022-11-04 ENCOUNTER — Encounter: Payer: Self-pay | Admitting: Dermatology

## 2022-11-10 ENCOUNTER — Other Ambulatory Visit: Payer: Self-pay | Admitting: Student in an Organized Health Care Education/Training Program

## 2022-11-10 ENCOUNTER — Other Ambulatory Visit: Payer: Self-pay | Admitting: Family Medicine

## 2022-11-10 DIAGNOSIS — R7989 Other specified abnormal findings of blood chemistry: Secondary | ICD-10-CM

## 2022-11-10 DIAGNOSIS — G894 Chronic pain syndrome: Secondary | ICD-10-CM

## 2022-11-10 DIAGNOSIS — M48062 Spinal stenosis, lumbar region with neurogenic claudication: Secondary | ICD-10-CM

## 2022-11-10 DIAGNOSIS — M48061 Spinal stenosis, lumbar region without neurogenic claudication: Secondary | ICD-10-CM

## 2022-11-10 DIAGNOSIS — G8929 Other chronic pain: Secondary | ICD-10-CM

## 2022-11-10 DIAGNOSIS — M5416 Radiculopathy, lumbar region: Secondary | ICD-10-CM

## 2022-11-11 ENCOUNTER — Telehealth: Payer: Self-pay | Admitting: Student in an Organized Health Care Education/Training Program

## 2022-11-11 NOTE — Telephone Encounter (Signed)
done 

## 2022-11-11 NOTE — Telephone Encounter (Signed)
PT stated that's pharmacy need a PA to fill [place]. TY

## 2022-11-12 ENCOUNTER — Encounter: Payer: Self-pay | Admitting: Student in an Organized Health Care Education/Training Program

## 2022-11-12 ENCOUNTER — Ambulatory Visit
Admission: RE | Admit: 2022-11-12 | Discharge: 2022-11-12 | Disposition: A | Discharge status: Home / Self Care | Payer: 59 | Source: Ambulatory Visit | Attending: Student in an Organized Health Care Education/Training Program | Admitting: Student in an Organized Health Care Education/Training Program

## 2022-11-12 ENCOUNTER — Ambulatory Visit
Payer: 59 | Attending: Student in an Organized Health Care Education/Training Program | Admitting: Student in an Organized Health Care Education/Training Program

## 2022-11-12 DIAGNOSIS — M5459 Other low back pain: Secondary | ICD-10-CM | POA: Insufficient documentation

## 2022-11-12 DIAGNOSIS — G8929 Other chronic pain: Secondary | ICD-10-CM

## 2022-11-12 DIAGNOSIS — M5416 Radiculopathy, lumbar region: Secondary | ICD-10-CM

## 2022-11-12 DIAGNOSIS — M48062 Spinal stenosis, lumbar region with neurogenic claudication: Secondary | ICD-10-CM | POA: Diagnosis not present

## 2022-11-12 DIAGNOSIS — M48061 Spinal stenosis, lumbar region without neurogenic claudication: Secondary | ICD-10-CM

## 2022-11-12 MED ORDER — IOHEXOL 180 MG/ML  SOLN
10.0000 mL | Freq: Once | INTRAMUSCULAR | Status: AC
  Administered 2022-11-12: 10 mL via EPIDURAL

## 2022-11-12 MED ORDER — ROPIVACAINE HCL 2 MG/ML IJ SOLN
INTRAMUSCULAR | Status: AC
  Filled 2022-11-12: qty 20

## 2022-11-12 MED ORDER — SODIUM CHLORIDE (PF) 0.9 % IJ SOLN
INTRAMUSCULAR | Status: AC
  Filled 2022-11-12: qty 10

## 2022-11-12 MED ORDER — LIDOCAINE HCL 2 % IJ SOLN
20.0000 mL | Freq: Once | INTRAMUSCULAR | Status: AC
  Administered 2022-11-12: 100 mg

## 2022-11-12 MED ORDER — DEXAMETHASONE SODIUM PHOSPHATE 10 MG/ML IJ SOLN
10.0000 mg | Freq: Once | INTRAMUSCULAR | Status: AC
  Administered 2022-11-12: 10 mg

## 2022-11-12 MED ORDER — LIDOCAINE HCL (PF) 2 % IJ SOLN
INTRAMUSCULAR | Status: AC
  Filled 2022-11-12: qty 10

## 2022-11-12 MED ORDER — IOHEXOL 180 MG/ML  SOLN
INTRAMUSCULAR | Status: AC
  Filled 2022-11-12: qty 20

## 2022-11-12 MED ORDER — DEXAMETHASONE SODIUM PHOSPHATE 10 MG/ML IJ SOLN
INTRAMUSCULAR | Status: AC
  Filled 2022-11-12: qty 1

## 2022-11-12 MED ORDER — SODIUM CHLORIDE 0.9% FLUSH
2.0000 mL | Freq: Once | INTRAVENOUS | Status: AC
  Administered 2022-11-12: 2 mL

## 2022-11-12 MED ORDER — ROPIVACAINE HCL 2 MG/ML IJ SOLN
2.0000 mL | Freq: Once | INTRAMUSCULAR | Status: AC
  Administered 2022-11-12: 2 mL via EPIDURAL

## 2022-11-12 NOTE — Patient Instructions (Signed)
____________________________________________________________________________________________  Post-Procedure Discharge Instructions  Instructions: Apply ice:  Purpose: This will minimize any swelling and discomfort after procedure.  When: Day of procedure, as soon as you get home. How: Fill a plastic sandwich bag with crushed ice. Cover it with a small towel and apply to injection site. How long: (15 min on, 15 min off) Apply for 15 minutes then remove x 15 minutes.  Repeat sequence on day of procedure, until you go to bed. Apply heat:  Purpose: To treat any soreness and discomfort from the procedure. When: Starting the next day after the procedure. How: Apply heat to procedure site starting the day following the procedure. How long: May continue to repeat daily, until discomfort goes away. Food intake: Start with clear liquids (like water) and advance to regular food, as tolerated.  Physical activities: Keep activities to a minimum for the first 8 hours after the procedure. After that, then as tolerated. Driving: If you have received any sedation, be responsible and do not drive. You are not allowed to drive for 24 hours after having sedation. Blood thinner: (Applies only to those taking blood thinners) You may restart your blood thinner 6 hours after your procedure. Insulin: (Applies only to Diabetic patients taking insulin) As soon as you can eat, you may resume your normal dosing schedule. Infection prevention: Keep procedure site clean and dry. Shower daily and clean area with soap and water. Post-procedure Pain Diary: Extremely important that this be done correctly and accurately. Recorded information will be used to determine the next step in treatment. For the purpose of accuracy, follow these rules: Evaluate only the area treated. Do not report or include pain from an untreated area. For the purpose of this evaluation, ignore all other areas of pain, except for the treated  area. After your procedure, avoid taking a long nap and attempting to complete the pain diary after you wake up. Instead, set your alarm clock to go off every hour, on the hour, for the initial 8 hours after the procedure. Document the duration of the numbing medicine, and the relief you are getting from it. Do not go to sleep and attempt to complete it later. It will not be accurate. If you received sedation, it is likely that you were given a medication that may cause amnesia. Because of this, completing the diary at a later time may cause the information to be inaccurate. This information is needed to plan your care. Follow-up appointment: Keep your post-procedure follow-up evaluation appointment after the procedure (usually 2 weeks for most procedures, 6 weeks for radiofrequencies). DO NOT FORGET to bring you pain diary with you.   Expect: (What should I expect to see with my procedure?) From numbing medicine (AKA: Local Anesthetics): Numbness or decrease in pain. You may also experience some weakness, which if present, could last for the duration of the local anesthetic. Onset: Full effect within 15 minutes of injected. Duration: It will depend on the type of local anesthetic used. On the average, 1 to 8 hours.  From steroids (Applies only if steroids were used): Decrease in swelling or inflammation. Once inflammation is improved, relief of the pain will follow. Onset of benefits: Depends on the amount of swelling present. The more swelling, the longer it will take for the benefits to be seen. In some cases, up to 10 days. Duration: Steroids will stay in the system x 2 weeks. Duration of benefits will depend on multiple posibilities including persistent irritating factors. Side-effects: If present, they   may typically last 2 weeks (the duration of the steroids). Frequent: Cramps (if they occur, drink Gatorade and take over-the-counter Magnesium 450-500 mg once to twice a day); water retention with  temporary weight gain; increases in blood sugar; decreased immune system response; increased appetite. Occasional: Facial flushing (red, warm cheeks); mood swings; menstrual changes. Uncommon: Long-term decrease or suppression of natural hormones; bone thinning. (These are more common with higher doses or more frequent use. This is why we prefer that our patients avoid having any injection therapies in other practices.)  Very Rare: Severe mood changes; psychosis; aseptic necrosis. From procedure: Some discomfort is to be expected once the numbing medicine wears off. This should be minimal if ice and heat are applied as instructed.  Call if: (When should I call?) You experience numbness and weakness that gets worse with time, as opposed to wearing off. New onset bowel or bladder incontinence. (Applies only to procedures done in the spine)  Emergency Numbers: Durning business hours (Monday - Thursday, 8:00 AM - 4:00 PM) (Friday, 9:00 AM - 12:00 Noon): [phone] After hours: [phone] NOTE: If you are having a problem and are unable connect with, or to talk to a provider, then go to your nearest urgent care or emergency department. If the problem is serious and urgent, please call 911. ____________________________________________________________________________________________   Epidural Steroid Injection Patient Information  Description: The epidural space surrounds the nerves as they exit the spinal cord.  In some patients, the nerves can be compressed and inflamed by a bulging disc or a tight spinal canal (spinal stenosis).  By injecting steroids into the epidural space, we can bring irritated nerves into direct contact with a potentially helpful medication.  These steroids act directly on the irritated nerves and can reduce swelling and inflammation which often leads to decreased pain.  Epidural steroids may be injected anywhere along the spine and from the neck to the low back  depending upon the location of your pain.   After numbing the skin with local anesthetic (like Novocaine), a small needle is passed into the epidural space slowly.  You may experience a sensation of pressure while this is being done.  The entire block usually last less than 10 minutes.  Conditions which may be treated by epidural steroids:  Low back and leg pain Neck and arm pain Spinal stenosis Post-laminectomy syndrome Herpes zoster (shingles) pain Pain from compression fractures  Preparation for the injection:  Do not eat any solid food or dairy products within 8 hours of your appointment.  You may drink clear liquids up to 3 hours before appointment.  Clear liquids include water, black coffee, juice or soda.  No milk or cream please. You may take your regular medication, including pain medications, with a sip of water before your appointment  Diabetics should hold regular insulin (if taken separately) and take 1/2 normal NPH dos the morning of the procedure.  Carry some sugar containing items with you to your appointment. A driver must accompany you and be prepared to drive you home after your procedure.  Bring all your current medications with your. An IV may be inserted and sedation may be given at the discretion of the physician.   A blood pressure cuff, EKG and other monitors will often be applied during the procedure.  Some patients may need to have extra oxygen administered for a short period. You will be asked to provide medical information, including your allergies, prior to the procedure.  We must know  immediately if you are taking blood thinners (like Coumadin/Warfarin)  Or if you are allergic to IV iodine contrast (dye). We must know if you could possible be pregnant.  Possible side-effects: Bleeding from needle site Infection (rare, may require surgery) Nerve injury (rare) Numbness & tingling (temporary) Difficulty urinating (rare, temporary) Spinal headache ( a headache  worse with upright posture) Light -headedness (temporary) Pain at injection site (several days) Decreased blood pressure (temporary) Weakness in arm/leg (temporary) Pressure sensation in back/neck (temporary)  Call if you experience: Fever/chills associated with headache or increased back/neck pain. Headache worsened by an upright position. New onset weakness or numbness of an extremity below the injection site Hives or difficulty breathing (go to the emergency room) Inflammation or drainage at the infection site Severe back/neck pain Any new symptoms which are concerning to you  Please note:  Although the local anesthetic injected can often make your back or neck feel good for several hours after the injection, the pain will likely return.  It takes 3-7 days for steroids to work in the epidural space.  You may not notice any pain relief for at least that one week.  If effective, we will often do a series of three injections spaced 3-6 weeks apart to maximally decrease your pain.  After the initial series, we generally will wait several months before considering a repeat injection of the same type.  If you have any questions, please call [phone] [facility]

## 2022-11-12 NOTE — Progress Notes (Signed)
PROVIDER NOTE: Interpretation of information contained herein should be left to medically-trained personnel. Specific patient instructions are provided elsewhere under "Patient Instructions" section of medical record. This document was created in part using STT-dictation technology, any transcriptional errors that may result from this process are unintentional.  Patient: Shawn Crawford Type: Established DOB: 1961-02-21 MRN: [id number] PCP: [name], MD  Service: Procedure DOS: 11/12/2022 Setting: Ambulatory Location: Ambulatory outpatient facility Delivery: Face-to-face Provider: Gillis Santa, MD Specialty: Interventional Pain Management Specialty designation: 09 Location: Outpatient facility Ref. Prov.: Gillis Santa, MD    Primary Reason for Visit: Interventional Pain Management Treatment. CC: Back Pain (lower)   Procedure:           Type: Lumbar epidural steroid injection (LESI) (interlaminar) #1 (2024)    Laterality: Midline   Level:  L4-5 Level.  Imaging: Fluoroscopic guidance         Anesthesia: Local anesthesia (1-2% Lidocaine) DOS: 11/12/2022  Performed by: Gillis Santa, MD  Purpose: Diagnostic/Therapeutic Indications: Lumbar radicular pain of intraspinal etiology of more than 4 weeks that has failed to respond to conservative therapy and is severe enough to impact quality of life or function. 1. Lumbar radiculopathy   2. Spinal stenosis, lumbar region, with neurogenic claudication   3. Chronic radicular lumbar pain   4. Neuroforaminal stenosis of lumbar spine    NAS-11 Pain score:   Pre-procedure: 3 /10   Post-procedure: 3 /10      Position / Prep / Materials:  Position: Prone w/ head of the table raised (slight reverse trendelenburg) to facilitate breathing.  Prep solution: DuraPrep (Iodine Povacrylex [0.7% available iodine] and Isopropyl Alcohol, 74% w/w) Prep Area: Entire Posterior Lumbar Region from lower scapular tip down to mid buttocks area and  from flank to flank. Materials:  Tray: Epidural tray Needle(s):  Type: Epidural needle (Tuohy) Gauge (G):  22 Length: Regular (3.5-in) Qty: 1  Pre-op H&P Assessment:  Shawn Crawford is a 62 y.o. (year old), male patient, seen today for interventional treatment. He  has a past surgical history that includes Coronary artery bypass graft; Hernia repair; Appendectomy; Cardiac catheterization; Colonoscopy with propofol (N/A, 06/24/2018); Esophagogastroduodenoscopy (egd) with propofol (N/A, 06/24/2018); Exam under anesthesia with hemorrhoidectomy (N/A, 11/11/2018); Insertion of mesh (02/05/2022); and Finger surgery (Left). Shawn Crawford has a current medication list which includes the following prescription(s): amlodipine, aspirin ec, belbuca, desonide, multiple vitamin, rosuvastatin, tadalafil, testosterone cypionate, tramadol, cyanocobalamin, and zolpidem. His primarily concern today is the Back Pain (lower)  Initial Vital Signs:  Pulse/HCG Rate: 83ECG Heart Rate: 78 Temp:  97.8 F (36.6 C) Resp: 16 BP: 118/82 SpO2: 100 %  BMI: Estimated body mass index is 22.04 kg/m as calculated from the following:   Height as of this encounter: 5' 11"$  (1.803 m).   Weight as of this encounter: 158 lb (71.7 kg).  Risk Assessment: Allergies: Reviewed. He is allergic to penicillins.  Allergy Precautions: None required Coagulopathies: Reviewed. None identified.  Blood-thinner therapy: None at this time Active Infection(s): Reviewed. None identified. Shawn Crawford is afebrile  Site Confirmation: Shawn Crawford was asked to confirm the procedure and laterality before marking the site Procedure checklist: Completed Consent: Before the procedure and under the influence of no sedative(s), amnesic(s), or anxiolytics, the patient was informed of the treatment options, risks and possible complications. To fulfill our ethical and legal obligations, as recommended by the American Medical Association's Code of Ethics, I have informed  the patient of my clinical impression; the nature and purpose of the treatment or  procedure; the risks, benefits, and possible complications of the intervention; the alternatives, including doing nothing; the risk(s) and benefit(s) of the alternative treatment(s) or procedure(s); and the risk(s) and benefit(s) of doing nothing. The patient was provided information about the general risks and possible complications associated with the procedure. These may include, but are not limited to: failure to achieve desired goals, infection, bleeding, organ or nerve damage, allergic reactions, paralysis, and death. In addition, the patient was informed of those risks and complications associated to Spine-related procedures, such as failure to decrease pain; infection (i.e.: Meningitis, epidural or intraspinal abscess); bleeding (i.e.: epidural hematoma, subarachnoid hemorrhage, or any other type of intraspinal or peri-dural bleeding); organ or nerve damage (i.e.: Any type of peripheral nerve, nerve root, or spinal cord injury) with subsequent damage to sensory, motor, and/or autonomic systems, resulting in permanent pain, numbness, and/or weakness of one or several areas of the body; allergic reactions; (i.e.: anaphylactic reaction); and/or death. Furthermore, the patient was informed of those risks and complications associated with the medications. These include, but are not limited to: allergic reactions (i.e.: anaphylactic or anaphylactoid reaction(s)); adrenal axis suppression; blood sugar elevation that in diabetics may result in ketoacidosis or comma; water retention that in patients with history of congestive heart failure may result in shortness of breath, pulmonary edema, and decompensation with resultant heart failure; weight gain; swelling or edema; medication-induced neural toxicity; particulate matter embolism and blood vessel occlusion with resultant organ, and/or nervous system infarction; and/or aseptic  necrosis of one or more joints. Finally, the patient was informed that Medicine is not an exact science; therefore, there is also the possibility of unforeseen or unpredictable risks and/or possible complications that may result in a catastrophic outcome. The patient indicated having understood very clearly. We have given the patient no guarantees and we have made no promises. Enough time was given to the patient to ask questions, all of which were answered to the patient's satisfaction. Mr. Brackens has indicated that he wanted to continue with the procedure. Attestation: I, the ordering provider, attest that I have discussed with the patient the benefits, risks, side-effects, alternatives, likelihood of achieving goals, and potential problems during recovery for the procedure that I have provided informed consent. Date  Time: 11/12/2022 12:59 PM  Pre-Procedure Preparation:  Monitoring: As per clinic protocol. Respiration, ETCO2, SpO2, BP, heart rate and rhythm monitor placed and checked for adequate function Safety Precautions: Patient was assessed for positional comfort and pressure points before starting the procedure. Time-out: I initiated and conducted the "Time-out" before starting the procedure, as per protocol. The patient was asked to participate by confirming the accuracy of the "Time Out" information. Verification of the correct person, site, and procedure were performed and confirmed by me, the nursing staff, and the patient. "Time-out" conducted as per Joint Commission's Universal Protocol (UP.01.01.01). Time: 1338  Description/Narrative of Procedure:          Target: Epidural space via interlaminar opening, initially targeting the lower laminar border of the superior vertebral body. Region: Lumbar Approach: Percutaneous paravertebral  Rationale (medical necessity): procedure needed and proper for the diagnosis and/or treatment of the patient's medical symptoms and needs. Procedural  Technique Safety Precautions: Aspiration looking for blood return was conducted prior to all injections. At no point did we inject any substances, as a needle was being advanced. No attempts were made at seeking any paresthesias. Safe injection practices and needle disposal techniques used. Medications properly checked for expiration dates. SDV (single dose vial) medications used.  Description of the Procedure: Protocol guidelines were followed. The procedure needle was introduced through the skin, ipsilateral to the reported pain, and advanced to the target area. Bone was contacted and the needle walked caudad, until the lamina was cleared. The epidural space was identified using "loss-of-resistance technique" with 2-3 ml of PF-NaCl (0.9% NSS), in a 5cc LOR glass syringe.  6cc solution made of 3 cc of preservative-free saline, 2 cc of 0.2% ropivacaine, 1 cc of Decadron 10 mg/cc.   Vitals:   11/12/22 1306 11/12/22 1336 11/12/22 1341  BP: 118/82 125/82 130/80  Pulse: 83    Resp: 16 18 18  $ Temp: 97.8 F (36.6 C)    SpO2: 100% 100% 100%  Weight: 158 lb (71.7 kg)    Height: 5' 11"$  (1.803 m)      Start Time: 1338 hrs. End Time: 1341 hrs.  Imaging Guidance (Spinal):          Type of Imaging Technique: Fluoroscopy Guidance (Spinal) Indication(s): Assistance in needle guidance and placement for procedures requiring needle placement in or near specific anatomical locations not easily accessible without such assistance. Exposure Time: Please see nurses notes. Contrast: Before injecting any contrast, we confirmed that the patient did not have an allergy to iodine, shellfish, or radiological contrast. Once satisfactory needle placement was completed at the desired level, radiological contrast was injected. Contrast injected under live fluoroscopy. No contrast complications. See chart for type and volume of contrast used. Fluoroscopic Guidance: I was personally present during the use of fluoroscopy.  "Tunnel Vision Technique" used to obtain the best possible view of the target area. Parallax error corrected before commencing the procedure. "Direction-depth-direction" technique used to introduce the needle under continuous pulsed fluoroscopy. Once target was reached, antero-posterior, oblique, and lateral fluoroscopic projection used confirm needle placement in all planes. Images permanently stored in EMR. Interpretation: I personally interpreted the imaging intraoperatively. Adequate needle placement confirmed in multiple planes. Appropriate spread of contrast into desired area was observed. No evidence of afferent or efferent intravascular uptake. No intrathecal or subarachnoid spread observed. Permanent images saved into the patient's record.  Antibiotic Prophylaxis:   Anti-infectives (From admission, onward)    None      Indication(s): None identified  Post-operative Assessment:  Post-procedure Vital Signs:  Pulse/HCG Rate: 8380 Temp:  97.8 F (36.6 C) Resp: 18 BP: 130/80 SpO2: 100 %  EBL: None  Complications: No immediate post-treatment complications observed by team, or reported by patient.  Note: The patient tolerated the entire procedure well. A repeat set of vitals were taken after the procedure and the patient was kept under observation following institutional policy, for this type of procedure. Post-procedural neurological assessment was performed, showing return to baseline, prior to discharge. The patient was provided with post-procedure discharge instructions, including a section on how to identify potential problems. Should any problems arise concerning this procedure, the patient was given instructions to immediately contact us, at any time, without hesitation. In any case, we plan to contact the patient by telephone for a follow-up status report regarding this interventional procedure.  Comments:  No additional relevant information.  5 out of 5 strength bilateral  lower extremity: Plantar flexion, dorsiflexion, knee flexion, knee extension.   Plan of Care  Orders:  Orders Placed This Encounter  Procedures   DG PAIN [facility]IN[facility] C-ARM 1-60 MIN NO REPORT    Intraoperative interpretation by procedural physician at [facility].    Standing Status:   Standing    Number of Occurrences:  1    Order Specific Question:   Reason for exam:    Answer:   Assistance in needle guidance and placement for procedures requiring needle placement in or near specific anatomical locations not easily accessible without such assistance.   Chronic Opioid Analgesic:  Belbuca 600 mcg BID, Tramadol 50 mg q8 hrs breakthrough pain    Medications ordered for procedure: Meds ordered this encounter  Medications   iohexol (OMNIPAQUE) 180 MG/ML injection 10 mL    Must be Myelogram-compatible. If not available, you may substitute with a water-soluble, non-ionic, hypoallergenic, myelogram-compatible radiological contrast medium.   lidocaine (XYLOCAINE) 2 % (with pres) injection 400 mg   sodium chloride flush (NS) 0.9 % injection 2 mL   ropivacaine (PF) 2 mg/mL (0.2%) (NAROPIN) injection 2 mL   dexamethasone (DECADRON) injection 10 mg   Medications administered: We administered iohexol, lidocaine, sodium chloride flush, ropivacaine (PF) 2 mg/mL (0.2%), and dexamethasone.  See the medical record for exact dosing, route, and time of administration.  Follow-up plan:   Return in about 4 weeks (around 12/10/2022) for Post Procedure Evaluation, virtual.      Recent Visits Date Type Provider Dept  08/26/22 Office Visit Gillis Santa, MD [facility]  Showing recent visits within past 90 days and meeting all other requirements Today's Visits Date Type Provider Dept  11/12/22 Procedure visit Gillis Santa, MD [facility]  Showing today's visits and meeting all other requirements Future Appointments Date Type Provider Dept  11/20/22 Appointment Gillis Santa, MD [facility]  12/10/22 Appointment Gillis Santa, MD [facility]  Showing future appointments within next 90 days and meeting all other requirements  Disposition: Discharge home  Discharge (Date  Time): 11/12/2022; 1352 hrs.   Primary Care Physician: [name], MD Location: [facility] Note by: Gillis Santa, MD Date: 11/12/2022; Time: 2:05 PM  Disclaimer:  Medicine is not an exact science. The only guarantee in medicine is that nothing is guaranteed. It is important to note that the decision to proceed with this intervention was based on the information collected from the patient. The Data and conclusions were drawn from the patient's questionnaire, the interview, and the physical examination. Because the information was provided in large part by the patient, it cannot be guaranteed that it has not been purposely or unconsciously manipulated. Every effort has been made to obtain as much relevant data as possible for this evaluation. It is important to note that the conclusions that lead to this procedure are derived in large part from the available data. Always take into account that the treatment will also be dependent on availability of resources and existing treatment guidelines, considered by other Pain Management Practitioners as being common knowledge and practice, at the time of the intervention. For Medico-Legal purposes, it is also important to point out that variation in procedural techniques and pharmacological choices are the acceptable norm. The indications, contraindications, technique, and results of the above procedure should only be interpreted and judged by a Board-Certified Interventional Pain Specialist with extensive familiarity and expertise in the same exact procedure and technique.

## 2022-11-13 ENCOUNTER — Telehealth: Payer: Self-pay | Admitting: *Deleted

## 2022-11-13 NOTE — Telephone Encounter (Signed)
Attempted to call for post procedure follow-up, message left.

## 2022-11-20 ENCOUNTER — Ambulatory Visit
Payer: 59 | Attending: Student in an Organized Health Care Education/Training Program | Admitting: Student in an Organized Health Care Education/Training Program

## 2022-11-20 ENCOUNTER — Encounter: Payer: Self-pay | Admitting: Student in an Organized Health Care Education/Training Program

## 2022-11-20 VITALS — BP 122/88 | HR 87 | Temp 97.3°F | Resp 16 | Ht 71.0 in | Wt 152.0 lb

## 2022-11-20 DIAGNOSIS — G8929 Other chronic pain: Secondary | ICD-10-CM | POA: Diagnosis present

## 2022-11-20 DIAGNOSIS — M5416 Radiculopathy, lumbar region: Secondary | ICD-10-CM | POA: Diagnosis present

## 2022-11-20 DIAGNOSIS — G894 Chronic pain syndrome: Secondary | ICD-10-CM | POA: Diagnosis present

## 2022-11-20 DIAGNOSIS — M48062 Spinal stenosis, lumbar region with neurogenic claudication: Secondary | ICD-10-CM | POA: Diagnosis present

## 2022-11-20 DIAGNOSIS — M48061 Spinal stenosis, lumbar region without neurogenic claudication: Secondary | ICD-10-CM | POA: Insufficient documentation

## 2022-11-20 MED ORDER — TRAMADOL HCL 50 MG PO TABS: 50.0000 mg | ORAL_TABLET | Freq: Three times a day (TID) | ORAL | 2 refills | Status: DC | PRN

## 2022-11-20 MED ORDER — BELBUCA 600 MCG BU FILM: 1.0000 | ORAL_FILM | Freq: Two times a day (BID) | BUCCAL | 2 refills | Status: DC

## 2022-11-20 NOTE — Progress Notes (Addendum)
PROVIDER NOTE: Information contained herein reflects review and annotations entered in association with encounter. Interpretation of such information and data should be left to medically-trained personnel. Information provided to patient can be located elsewhere in the medical record under "Patient Instructions". Document created using STT-dictation technology, any transcriptional errors that may result from process are unintentional.    Patient: Shawn Crawford  Service Category: E/M  Provider: Gillis Santa, MD  DOB: 1961/07/14  DOS: 11/20/2022  Referring Provider: Donnal Moat*  MRN: [id number]  Specialty: Interventional Pain Management  PCP: [name], MD  Type: Established Patient  Setting: Ambulatory outpatient    Location: Office  Delivery: Face-to-face     HPI  [name], a 62 y.o. year old male, is here today because of his Chronic pain syndrome [G89.4]. [name] primary complain today is Back Pain (Lumbar bilateral ) Last encounter: My last encounter with him was on 11/12/2022. Pertinent problems: [name] has Arteriosclerosis of coronary artery; Chronic radicular lumbar pain; Benign essential HTN; Chronic pain syndrome; Chronic midline low back pain without sciatica; Lumbar facet arthropathy; Lumbar degenerative disc disease; and Piriformis muscle pain on their pertinent problem list. Pain Assessment: Severity of Chronic pain is reported as a 2 /10. Location: Back Lower, Left, Right/into buttocks and down the back of legs. Onset: More than a month ago. Quality: Constant, Discomfort, Aching. Timing: Constant. Modifying factor(s): medications, laying on side with pillow. Vitals:  height is 5' 11"$  (1.803 m) and weight is 152 lb (68.9 kg). His temporal temperature is 97.3 F (36.3 C) (abnormal). His blood pressure is 122/88 and his pulse is 87. His respiration is 16 and oxygen saturation is 100%.  BMI: Estimated body mass index is 21.2 kg/m as calculated from the  following:   Height as of this encounter: 5' 11"$  (1.803 m).   Weight as of this encounter: 152 lb (68.9 kg).  Reason for encounter: both, medication management and post-procedure evaluation and assessment.    Post-procedure evaluation   Type: Lumbar epidural steroid injection (LESI) (interlaminar) #1 (2024)    Laterality: Midline   Level:  L4-5 Level.  Imaging: Fluoroscopic guidance         Anesthesia: Local anesthesia (1-2% Lidocaine) DOS: 11/12/2022  Performed by: Gillis Santa, MD  Purpose: Diagnostic/Therapeutic Indications: Lumbar radicular pain of intraspinal etiology of more than 4 weeks that has failed to respond to conservative therapy and is severe enough to impact quality of life or function. 1. Lumbar radiculopathy   2. Spinal stenosis, lumbar region, with neurogenic claudication   3. Chronic radicular lumbar pain   4. Neuroforaminal stenosis of lumbar spine    NAS-11 Pain score:   Pre-procedure: 3 /10   Post-procedure: 3 /10      Effectiveness:  Initial hour after procedure: 0 %  Subsequent 4-6 hours post-procedure: 0 %  Analgesia past initial 6 hours: 35 % (did not experience pain relief until day 5 and then he noticed relief)  Ongoing improvement:  Analgesic:  50%    Pharmacotherapy Assessment  Analgesic: Belbuca 600 mcg BID, Tramadol 50 mg q8 hrs breakthrough pain    Monitoring: [place] PMP: PDMP reviewed during this encounter.       Pharmacotherapy: No side-effects or adverse reactions reported. Compliance: No problems identified. Effectiveness: Clinically acceptable.  Janett Billow, RN  11/20/2022 11:34 AM  Signed Nursing Pain Medication Assessment:  Safety precautions to be maintained throughout the outpatient stay will include: orient to surroundings, keep bed in low position, maintain  call bell within reach at all times, provide assistance with transfer out of bed and ambulation.  Medication Inspection Compliance:  films  Medication #1:  Buprenorphine (Suboxone) Pill/Patch Count:  44 out 60 Pill/Patch Appearance: Markings consistent with prescribed medication Bottle Appearance: Standard pharmacy container. Clearly labeled. Filled Date: 02 / 13 / 2024 Last Medication intake:  Today  Medication #2: Tramadol (Ultram) Pill/Patch Count:  59 of 90 pills remain Pill/Patch Appearance: Markings consistent with prescribed medication Bottle Appearance: Standard pharmacy container. Clearly labeled. Filled Date: 02 / 12 / 2024 Last Medication intake:  Today    No results found for: "CBDTHCR" No results found for: "D8THCCBX" No results found for: "D9THCCBX"  UDS:  Summary  Date Value Ref Range Status  08/26/2022 Note  Final    Comment:    ==================================================================== [facility] 13 (MW) ==================================================================== Test                             Result       Flag       Units  Drug Present and Declared for Prescription Verification   Buprenorphine                  20           EXPECTED   ng/mg creat   Norbuprenorphine               118          EXPECTED   ng/mg creat    Source of buprenorphine is a scheduled prescription medication.    Norbuprenorphine is an expected metabolite of buprenorphine.    Tramadol                       >2538        EXPECTED   ng/mg creat   O-Desmethyltramadol            >2538        EXPECTED   ng/mg creat   N-Desmethyltramadol            973          EXPECTED   ng/mg creat    Source of tramadol is a prescription medication. O-desmethyltramadol    and N-desmethyltramadol are expected metabolites of tramadol.  ==================================================================== Test                      Result    Flag   Units      Ref Range   Creatinine              197              mg/dL      >=20 ==================================================================== Declared Medications:  The flagging and  interpretation on this report are based on the  following declared medications.  Unexpected results may arise from  inaccuracies in the declared medications.   **Note: The testing scope of this panel includes these medications:   Tramadol (Ultram)   **Note: The testing scope of this panel does not include small to  moderate amounts of these reported medications:   Buprenorphine (Belbuca)   **Note: The testing scope of this panel does not include the  following reported medications:   Amlodipine (Norvasc)  Aspirin  Desonide (Desowen)  Multivitamin  Rosuvastatin (Crestor)  Tadalafil (Cialis)  Testosterone  Vitamin B12  Zolpidem (Ambien) ==================================================================== For clinical consultation, please call [phone]. ====================================================================       ROS  Constitutional: Denies any fever or chills Gastrointestinal: No reported hemesis, hematochezia, vomiting, or acute GI distress Musculoskeletal:  Low back pain with radiation into left leg Neurological: No reported episodes of acute onset apraxia, aphasia, dysarthria, agnosia, amnesia, paralysis, loss of coordination, or loss of consciousness  Medication Review  Buprenorphine HCl, Multiple Vitamin, amLODipine, aspirin EC, cyanocobalamin, desonide, rosuvastatin, tadalafil, testosterone cypionate, traMADol, and zolpidem  History Review  Allergy: [name] is allergic to penicillins. Drug: [name]  reports no history of drug use. Alcohol:  reports current alcohol use. Tobacco:  reports that he has never smoked. He has never been exposed to tobacco smoke. He has never used smokeless tobacco. Social: [name]  reports that he has never smoked. He has never been exposed to tobacco smoke. He has never used smokeless tobacco. He reports current alcohol use. He reports that he does not use drugs. Medical:  has a past medical history of Actinic  keratosis, Coronary artery disease, Dysplastic nevus (09/06/2014), Dysplastic nevus (07/09/2015), cardiac catheterization, Hyperlipidemia, and Hypertension. Surgical: [name]  has a past surgical history that includes Coronary artery bypass graft; Hernia repair; Appendectomy; Cardiac catheterization; Colonoscopy with propofol (N/A, 06/24/2018); Esophagogastroduodenoscopy (egd) with propofol (N/A, 06/24/2018); Exam under anesthesia with hemorrhoidectomy (N/A, 11/11/2018); Insertion of mesh (02/05/2022); and Finger surgery (Left). Family: family history includes Colon cancer in his sister; Diabetes in his father; Healthy in his son and son; Hyperlipidemia in his mother; Hypertension in his father; Thyroid cancer in his sister.  Laboratory Chemistry Profile   Renal Lab Results  Component Value Date   BUN 13 06/23/2022   CREATININE 1.25 06/23/2022   BCR 10 06/23/2022   GFRAA 96 04/18/2020   GFRNONAA >60 01/29/2022    Hepatic Lab Results  Component Value Date   AST 23 01/28/2022   ALT 19 01/28/2022   ALBUMIN 4.4 06/23/2022   ALKPHOS 84 01/28/2022   LIPASE 23 06/14/2021    Electrolytes Lab Results  Component Value Date   NA 138 06/23/2022   K 4.8 06/23/2022   CL 97 06/23/2022   CALCIUM 9.2 06/23/2022   MG 2.0 01/28/2022   PHOS 2.7 (L) 06/23/2022    Bone Lab Results  Component Value Date   TESTOFREE 5.1 (L) 11/09/2019   TESTOSTERONE 263 (L) 08/16/2021    Inflammation (CRP: Acute Phase) (ESR: Chronic Phase) Lab Results  Component Value Date   CRP 6.0 08/19/2022   ESRSEDRATE 6 08/19/2022         Note: Above Lab results reviewed.   Physical Exam  General appearance: Well nourished, well developed, and well hydrated. In no apparent acute distress Mental status: Alert, oriented x 3 (person, place, & time)       Respiratory: No evidence of acute respiratory distress Eyes: PERLA Vitals: BP 122/88 (BP Location: Right Arm, Patient Position: Sitting, Cuff Size: Normal)   Pulse  87   Temp (!) 97.3 F (36.3 C) (Temporal)   Resp 16   Ht 5' 11"$  (1.803 m)   Wt 152 lb (68.9 kg)   SpO2 100%   BMI 21.20 kg/m  BMI: Estimated body mass index is 21.2 kg/m as calculated from the following:   Height as of this encounter: 5' 11"$  (1.803 m).   Weight as of this encounter: 152 lb (68.9 kg). Ideal: Ideal body weight: 75.3 kg (166 lb 0.1 oz)  Assessment   Diagnosis Status  1. Chronic pain syndrome   2. Lumbar radiculopathy  3. Spinal stenosis, lumbar region, with neurogenic claudication   4. Chronic radicular lumbar pain   5. Neuroforaminal stenosis of lumbar spine    Controlled Controlled Controlled     Plan of Care    [name] has a current medication list which includes the following long-term medication(s): amlodipine, rosuvastatin, tadalafil, testosterone cypionate, zolpidem, and [START ON 12/10/2022] tramadol.  Pharmacotherapy (Medications Ordered): Meds ordered this encounter  Medications   Buprenorphine HCl (BELBUCA) 600 MCG FILM    Sig: Place 1 Film inside cheek every 12 (twelve) hours.    Dispense:  60 each    Refill:  2   traMADol (ULTRAM) 50 MG tablet    Sig: Take 1 tablet (50 mg total) by mouth every 8 (eight) hours as needed for severe pain. Month last 30 days.    Dispense:  90 tablet    Refill:  2    [facility] STOP ACT - Not applicable. Fill one day early if pharmacy is closed on scheduled refill date. .   Consider left L3, L4 transforaminal epidural steroid injection in future   Follow-up plan:   Return in about 15 weeks (around 03/05/2023) for Medication Management, in person.      Recent Visits Date Type Provider Dept  11/12/22 Procedure visit Gillis Santa, MD [facility]  08/26/22 Office Visit Gillis Santa, MD [facility]  Showing recent visits within past 90 days and meeting all other requirements Today's Visits Date Type Provider Dept  11/20/22 Office Visit Gillis Santa, MD [facility]   Showing today's visits and meeting all other requirements Future Appointments No visits were found meeting these conditions. Showing future appointments within next 90 days and meeting all other requirements  I discussed the assessment and treatment plan with the patient. The patient was provided an opportunity to ask questions and all were answered. The patient agreed with the plan and demonstrated an understanding of the instructions.  Patient advised to call back or seek an in-person evaluation if the symptoms or condition worsens.  Duration of encounter: 92mnutes.  Total time on encounter, as per AMA guidelines included both the face-to-face and non-face-to-face time personally spent by the physician and/or other qualified health care professional(s) on the day of the encounter (includes time in activities that require the physician or other qualified health care professional and does not include time in activities normally performed by clinical staff). Physician's time may include the following activities when performed: Preparing to see the patient (e.g., pre-charting review of records, searching for previously ordered imaging, lab work, and nerve conduction tests) Review of prior analgesic pharmacotherapies. Reviewing PMP Interpreting ordered tests (e.g., lab work, imaging, nerve conduction tests) Performing post-procedure evaluations, including interpretation of diagnostic procedures Obtaining and/or reviewing separately obtained history Performing a medically appropriate examination and/or evaluation Counseling and educating the patient/family/caregiver Ordering medications, tests, or procedures Referring and communicating with other health care professionals (when not separately reported) Documenting clinical information in the electronic or other health record Independently interpreting results (not separately reported) and communicating results to the patient/  family/caregiver Care coordination (not separately reported)  Note by: BGillis Santa MD Date: 11/20/2022; Time: 11:34 AM

## 2022-11-20 NOTE — Progress Notes (Signed)
Nursing Pain Medication Assessment:  Safety precautions to be maintained throughout the outpatient stay will include: orient to surroundings, keep bed in low position, maintain call bell within reach at all times, provide assistance with transfer out of bed and ambulation.  Medication Inspection Compliance:  films  Medication #1: Buprenorphine (Suboxone) Pill/Patch Count:  44 out 60 Pill/Patch Appearance: Markings consistent with prescribed medication Bottle Appearance: Standard pharmacy container. Clearly labeled. Filled Date: 02 / 13 / 2024 Last Medication intake:  Today  Medication #2: Tramadol (Ultram) Pill/Patch Count:  59 of 90 pills remain Pill/Patch Appearance: Markings consistent with prescribed medication Bottle Appearance: Standard pharmacy container. Clearly labeled. Filled Date: 02 / 12 / 2024 Last Medication intake:  Today

## 2022-11-21 ENCOUNTER — Other Ambulatory Visit: Payer: Self-pay | Admitting: Family Medicine

## 2022-11-21 DIAGNOSIS — R7989 Other specified abnormal findings of blood chemistry: Secondary | ICD-10-CM

## 2022-11-21 MED ORDER — ZOLPIDEM TARTRATE ER 12.5 MG PO TBCR: 12.5000 mg | EXTENDED_RELEASE_TABLET | Freq: Every evening | ORAL | 1 refills | Status: DC | PRN

## 2022-11-21 NOTE — Telephone Encounter (Signed)
LOV:08/11/2022 NOV: none Patient was advised to return in 6 months for CPE

## 2022-11-21 NOTE — Telephone Encounter (Signed)
[place] pharmacy faxed refill request for the following medications:   DEPOTESTOSTERONE CYPIONATE) 200 MG/ML injection   Please advise

## 2022-11-21 NOTE — Telephone Encounter (Signed)
[place] pharmacy faxed refill request for the following medications:   zolpidem (AMBIEN CR) 12.5 MG CR tablet   Please advise

## 2022-11-25 ENCOUNTER — Encounter: Payer: Self-pay | Admitting: Family Medicine

## 2022-11-25 ENCOUNTER — Telehealth: Payer: Self-pay | Admitting: Family Medicine

## 2022-11-25 DIAGNOSIS — G4709 Other insomnia: Secondary | ICD-10-CM

## 2022-11-25 DIAGNOSIS — R7989 Other specified abnormal findings of blood chemistry: Secondary | ICD-10-CM

## 2022-11-25 MED ORDER — TESTOSTERONE CYPIONATE 200 MG/ML IM SOLN: INTRAMUSCULAR | 0 refills | Status: DC

## 2022-11-25 MED ORDER — ZOLPIDEM TARTRATE ER 12.5 MG PO TBCR: 12.5000 mg | EXTENDED_RELEASE_TABLET | Freq: Every evening | ORAL | 1 refills | Status: AC | PRN

## 2022-11-25 NOTE — Telephone Encounter (Signed)
[facility] faxed refill request for the following medications:/not [facility]   zolpidem (AMBIEN CR) 12.5 MG CR tablet    Please advise

## 2022-11-25 NOTE — Telephone Encounter (Signed)
[facility] pharmacy faxed refill request for the following medications:/not [facility]      testosterone cypionate (DEPOTESTOSTERONE CYPIONATE) 200 MG/ML injection    Please advise

## 2022-11-26 ENCOUNTER — Telehealth: Payer: Self-pay | Admitting: Family Medicine

## 2022-11-26 MED ORDER — ESZOPICLONE 1 MG PO TABS: 1.0000 mg | ORAL_TABLET | Freq: Every evening | ORAL | 0 refills | Status: DC | PRN

## 2022-11-26 NOTE — Telephone Encounter (Signed)
Pt is calling back that he spoke to the pharmacy it will be about 60 days before Ambien is back on the market. Pt would like to know if Dr. Rosita Fire- Quentin Cornwall would consider changing the script to medication lunesta that he was previously on the  Lunesta '3mg'$  or any other medication that is comparable.  Preferred Pharmacy [facility]- [id number]

## 2022-11-26 NOTE — Telephone Encounter (Signed)
The patient called in stating he cannot find the zolpidem (AMBIEN CR) 12.5 MG CR tablet anywhere at this time. He is requesting a script for 6.25 mg taken 2 times which is half the dose of the normal prescription which is 12.5. Please assist patient further as he uses   [place], [place] Phone: [phone]  Fax: [phone]     He is currently completely out and needs this to sleep

## 2022-11-26 NOTE — Telephone Encounter (Signed)
Will send in lunesta for temporary supply until Ambien is available  [name], MD  [facility]

## 2022-11-26 NOTE — Telephone Encounter (Signed)
LVM. Ok for [facility] to advise if patient returns call.

## 2022-11-27 ENCOUNTER — Other Ambulatory Visit: Payer: Self-pay

## 2022-11-27 NOTE — Progress Notes (Signed)
error 

## 2022-12-09 ENCOUNTER — Other Ambulatory Visit: Payer: Self-pay | Admitting: Student in an Organized Health Care Education/Training Program

## 2022-12-09 DIAGNOSIS — G8929 Other chronic pain: Secondary | ICD-10-CM

## 2022-12-09 DIAGNOSIS — M48061 Spinal stenosis, lumbar region without neurogenic claudication: Secondary | ICD-10-CM

## 2022-12-09 DIAGNOSIS — G894 Chronic pain syndrome: Secondary | ICD-10-CM

## 2022-12-09 DIAGNOSIS — M48062 Spinal stenosis, lumbar region with neurogenic claudication: Secondary | ICD-10-CM

## 2022-12-09 DIAGNOSIS — M5416 Radiculopathy, lumbar region: Secondary | ICD-10-CM

## 2022-12-10 ENCOUNTER — Telehealth: Payer: 59 | Admitting: Student in an Organized Health Care Education/Training Program

## 2022-12-11 ENCOUNTER — Other Ambulatory Visit: Payer: Self-pay | Admitting: *Deleted

## 2022-12-11 ENCOUNTER — Telehealth: Payer: Self-pay | Admitting: Student in an Organized Health Care Education/Training Program

## 2022-12-11 DIAGNOSIS — G894 Chronic pain syndrome: Secondary | ICD-10-CM

## 2022-12-11 DIAGNOSIS — M48061 Spinal stenosis, lumbar region without neurogenic claudication: Secondary | ICD-10-CM

## 2022-12-11 DIAGNOSIS — M48062 Spinal stenosis, lumbar region with neurogenic claudication: Secondary | ICD-10-CM

## 2022-12-11 DIAGNOSIS — G8929 Other chronic pain: Secondary | ICD-10-CM

## 2022-12-11 DIAGNOSIS — M5416 Radiculopathy, lumbar region: Secondary | ICD-10-CM

## 2022-12-11 MED ORDER — BELBUCA 600 MCG BU FILM: 1.0000 | ORAL_FILM | Freq: Two times a day (BID) | BUCCAL | 2 refills | Status: DC

## 2022-12-11 NOTE — Telephone Encounter (Signed)
PT stated that he call pharmacy to get refill on Belbuca and was inform that he didn't have a prescription to be pick up. PT stated that he needs this medication sent to pharmacy asap, he will be going out of town. Please give patient a call. TY

## 2022-12-11 NOTE — Telephone Encounter (Signed)
Rx request sent to Dr. Lateef 

## 2022-12-25 ENCOUNTER — Encounter: Payer: Self-pay | Admitting: Student in an Organized Health Care Education/Training Program

## 2022-12-26 ENCOUNTER — Other Ambulatory Visit: Payer: Self-pay

## 2022-12-26 DIAGNOSIS — I10 Essential (primary) hypertension: Secondary | ICD-10-CM

## 2022-12-26 MED ORDER — AMLODIPINE BESYLATE 5 MG PO TABS: 5.0000 mg | ORAL_TABLET | Freq: Every day | ORAL | 1 refills | Status: AC

## 2023-01-09 ENCOUNTER — Telehealth: Payer: Self-pay | Admitting: Student in an Organized Health Care Education/Training Program

## 2023-01-09 ENCOUNTER — Other Ambulatory Visit: Payer: Self-pay

## 2023-01-09 DIAGNOSIS — M48061 Spinal stenosis, lumbar region without neurogenic claudication: Secondary | ICD-10-CM

## 2023-01-09 DIAGNOSIS — M48062 Spinal stenosis, lumbar region with neurogenic claudication: Secondary | ICD-10-CM

## 2023-01-09 DIAGNOSIS — G8929 Other chronic pain: Secondary | ICD-10-CM

## 2023-01-09 DIAGNOSIS — G894 Chronic pain syndrome: Secondary | ICD-10-CM

## 2023-01-09 DIAGNOSIS — M5416 Radiculopathy, lumbar region: Secondary | ICD-10-CM

## 2023-01-09 NOTE — Telephone Encounter (Signed)
PT needs Belbuca send to [facility] on [address].PT stated that [facility] does have one box in. Please give patient a call.TY

## 2023-01-09 NOTE — Telephone Encounter (Signed)
Refill rx sent to [facility]

## 2023-01-12 ENCOUNTER — Other Ambulatory Visit: Payer: Self-pay

## 2023-01-12 ENCOUNTER — Telehealth: Payer: Self-pay | Admitting: Student in an Organized Health Care Education/Training Program

## 2023-01-12 ENCOUNTER — Telehealth: Payer: Self-pay

## 2023-01-12 DIAGNOSIS — M48062 Spinal stenosis, lumbar region with neurogenic claudication: Secondary | ICD-10-CM

## 2023-01-12 DIAGNOSIS — M48061 Spinal stenosis, lumbar region without neurogenic claudication: Secondary | ICD-10-CM

## 2023-01-12 DIAGNOSIS — M5416 Radiculopathy, lumbar region: Secondary | ICD-10-CM

## 2023-01-12 DIAGNOSIS — G8929 Other chronic pain: Secondary | ICD-10-CM

## 2023-01-12 DIAGNOSIS — G894 Chronic pain syndrome: Secondary | ICD-10-CM

## 2023-01-12 MED ORDER — BELBUCA 600 MCG BU FILM: 1.0000 | ORAL_FILM | Freq: Two times a day (BID) | BUCCAL | 2 refills | Status: DC

## 2023-01-12 NOTE — Telephone Encounter (Signed)
Called pharm to cancel prescription. They did not have.

## 2023-01-12 NOTE — Telephone Encounter (Signed)
Patient states his script for Belbuca got sent to the wrong [facility]. Please call patient

## 2023-01-12 NOTE — Telephone Encounter (Signed)
Called patient and informed that med was sent today

## 2023-01-12 NOTE — Telephone Encounter (Signed)
Spoke with pharmacy and they do not have the prescription that was sent this morning.  Dr Cherylann Ratel sent another refill request.

## 2023-01-12 NOTE — Telephone Encounter (Signed)
PT will like for his prescription belbuca to be send to [facility]. [address] [phone]

## 2023-01-12 NOTE — Telephone Encounter (Signed)
PT  called back stated that he called pharmacy was told that his prescription hadn't been send in. Please give patient a call. TY

## 2023-01-20 ENCOUNTER — Other Ambulatory Visit: Payer: Self-pay | Admitting: Student in an Organized Health Care Education/Training Program

## 2023-01-20 DIAGNOSIS — G894 Chronic pain syndrome: Secondary | ICD-10-CM

## 2023-01-20 DIAGNOSIS — M5416 Radiculopathy, lumbar region: Secondary | ICD-10-CM

## 2023-01-20 DIAGNOSIS — G8929 Other chronic pain: Secondary | ICD-10-CM

## 2023-01-20 DIAGNOSIS — M48062 Spinal stenosis, lumbar region with neurogenic claudication: Secondary | ICD-10-CM

## 2023-01-20 DIAGNOSIS — M48061 Spinal stenosis, lumbar region without neurogenic claudication: Secondary | ICD-10-CM

## 2023-02-03 ENCOUNTER — Other Ambulatory Visit: Payer: Self-pay | Admitting: Family Medicine

## 2023-02-03 DIAGNOSIS — R7989 Other specified abnormal findings of blood chemistry: Secondary | ICD-10-CM

## 2023-02-04 NOTE — Telephone Encounter (Signed)
Requested medication (s) are due for refill today:For review  Requested medication (s) are on the active medication list: yes    Last refill: 11/25/22 2ml  0 refills  Future visit scheduled no  Notes to clinic:  Off protocol, please review. Needles 25Gx1 ans Syr Luer Lok 3ml  21G x 1.5 are not on current med profile. Please review. Thank you.  Requested Prescriptions  Pending Prescriptions Disp Refills   testosterone cypionate (DEPOTESTOSTERONE CYPIONATE) 200 MG/ML injection [Pharmacy Med Name: Testosterone Cypionate 200 MG/ML Intramuscular Solution] 2 mL     Sig: INJECT INTRAMUSCULARLY 1 ML  EVERY 14 DAYS AND CAN TRY TO GO  18 DAYS (DISCARD UNUSED PORTION  AFTER FIRST USE)     Off-Protocol Failed - 02/03/2023  6:16 PM      Failed - Medication not assigned to a protocol, review manually.      Passed - Valid encounter within last 12 months    Recent Outpatient Visits           5 months ago Need for immunization against influenza   [place] [facility] Simmons-Robinson, [place], MD   6 months ago Arthralgia of ankle, left   [facility] [place], [place], MD   7 months ago Cellulitis of lower extremity, unspecified laterality   [facility] Bosie Clos, MD   11 months ago Lumbar radiculopathy   [facility] Bosie Clos, MD   1 year ago Pain in both lower extremities   [facility] Bosie Clos, MD       Future Appointments             In 3 weeks Rice, Jamesetta [place], MD [facility]   In 9 months Deirdre Evener, MD [facility]             B-D DISP NEEDLE 25GX1" 25G X 1" MISC [Pharmacy Med Name: NEEDLE_25GX1"] 7 each     Sig: USE AS DIRECTED WITH  TESTOSTERONE     There is no refill protocol information for this order     B-D 3CC LUER-LOK SYR 21GX1-1/2 21G X 1-1/2" 3 ML MISC [Pharmacy Med  Name: [id number]-[id number]"] 7 each     Sig: USE AS DIRECTED WITH  TESTOSTERONE     There is no refill protocol information for this order

## 2023-02-24 ENCOUNTER — Encounter: Payer: 59 | Admitting: Student in an Organized Health Care Education/Training Program

## 2023-02-25 ENCOUNTER — Ambulatory Visit: Payer: No Typology Code available for payment source | Admitting: Internal Medicine

## 2023-02-26 ENCOUNTER — Encounter: Payer: 59 | Admitting: Student in an Organized Health Care Education/Training Program

## 2023-03-05 ENCOUNTER — Encounter: Payer: Self-pay | Admitting: Student in an Organized Health Care Education/Training Program

## 2023-03-05 ENCOUNTER — Ambulatory Visit
Payer: 59 | Attending: Student in an Organized Health Care Education/Training Program | Admitting: Student in an Organized Health Care Education/Training Program

## 2023-03-05 DIAGNOSIS — M48061 Spinal stenosis, lumbar region without neurogenic claudication: Secondary | ICD-10-CM | POA: Insufficient documentation

## 2023-03-05 DIAGNOSIS — G8929 Other chronic pain: Secondary | ICD-10-CM | POA: Diagnosis present

## 2023-03-05 DIAGNOSIS — G894 Chronic pain syndrome: Secondary | ICD-10-CM | POA: Diagnosis present

## 2023-03-05 DIAGNOSIS — M48062 Spinal stenosis, lumbar region with neurogenic claudication: Secondary | ICD-10-CM | POA: Insufficient documentation

## 2023-03-05 DIAGNOSIS — M5416 Radiculopathy, lumbar region: Secondary | ICD-10-CM | POA: Insufficient documentation

## 2023-03-05 MED ORDER — TRAMADOL HCL 50 MG PO TABS: 50.0000 mg | ORAL_TABLET | Freq: Three times a day (TID) | ORAL | 2 refills | Status: DC | PRN

## 2023-03-05 MED ORDER — BELBUCA 600 MCG BU FILM: 1.0000 | ORAL_FILM | Freq: Two times a day (BID) | BUCCAL | 2 refills | Status: DC

## 2023-03-05 NOTE — Patient Instructions (Signed)
______________________________________________________________________  Preparing for your procedure  Appointments: If you think you may not be able to keep your appointment, call 24-48 hours in advance to cancel. We need time to make it available to others.  During your procedure appointment there will be: No Prescription Refills. No disability issues to discussed. No medication changes or discussions.  Instructions: Food intake: Avoid eating anything solid for at least 8 hours prior to your procedure. Clear liquid intake: You may take clear liquids such as water up to 2 hours prior to your procedure. (No carbonated drinks. No soda.) Transportation: Unless otherwise stated by your physician, bring a driver. Morning Medicines: Except for blood thinners, take all of your other morning medications with a sip of water. Make sure to take your heart and blood pressure medicines. If your blood pressure's lower number is above 100, the case will be rescheduled. Blood thinners: Make sure to stop your blood thinners as instructed.  If you take a blood thinner, but were not instructed to stop it, call our office [phone] and ask to talk to a nurse. Not stopping a blood thinner prior to certain procedures could lead to serious complications. Diabetics on insulin: Notify the staff so that you can be scheduled 1st case in the morning. If your diabetes requires high dose insulin, take only  of your normal insulin dose the morning of the procedure and notify the staff that you have done so. Preventing infections: Shower with an antibacterial soap the morning of your procedure.  Build-up your immune system: Take 1000 mg of Vitamin C with every meal (3 times a day) the day prior to your procedure. Antibiotics: Inform the nursing staff if you are taking any antibiotics or if you have any conditions that may require antibiotics prior to procedures. (Example: recent joint implants)   Pregnancy: If you are  pregnant make sure to notify the nursing staff. Not doing so may result in injury to the fetus, including death.  Sickness: If you have a cold, fever, or any active infections, call and cancel or reschedule your procedure. Receiving steroids while having an infection may result in complications. Arrival: You must be in the facility at least 30 minutes prior to your scheduled procedure. Tardiness: Your scheduled time is also the cutoff time. If you do not arrive at least 15 minutes prior to your procedure, you will be rescheduled.  Children: Do not bring any children with you. Make arrangements to keep them home. Dress appropriately: There is always a possibility that your clothing may get soiled. Avoid long dresses. Valuables: Do not bring any jewelry or valuables.  Reasons to call and reschedule or cancel your procedure: (Following these recommendations will minimize the risk of a serious complication.) Surgeries: Avoid having procedures within 2 weeks of any surgery. (Avoid for 2 weeks before or after any surgery). Flu Shots: Avoid having procedures within 2 weeks of a flu shots or . (Avoid for 2 weeks before or after immunizations). Barium: Avoid having a procedure within 7-10 days after having had a radiological study involving the use of radiological contrast. (Myelograms, Barium swallow or enema study). Heart attacks: Avoid any elective procedures or surgeries for the initial 6 months after a "Myocardial Infarction" (Heart Attack). Blood thinners: It is imperative that you stop these medications before procedures. Let us know if you if you take any blood thinner.  Infection: Avoid procedures during or within two weeks of an infection (including chest colds or gastrointestinal problems). Symptoms associated with infections  include: Localized redness, fever, chills, night sweats or profuse sweating, burning sensation when voiding, cough, congestion, stuffiness, runny nose, sore throat, diarrhea,  nausea, vomiting, cold or Flu symptoms, recent or current infections. It is specially important if the infection is over the area that we intend to treat. Heart and lung problems: Symptoms that may suggest an active cardiopulmonary problem include: cough, chest pain, breathing difficulties or shortness of breath, dizziness, ankle swelling, uncontrolled high or unusually low blood pressure, and/or palpitations. If you are experiencing any of these symptoms, cancel your procedure and contact your primary care physician for an evaluation.  Remember:  Regular Business hours are:  Monday to Thursday 8:00 AM to 4:00 PM  Provider's Schedule: Delano Metz, MD:  Procedure days: Tuesday and Thursday 7:30 AM to 4:00 PM  Edward Jolly, MD:  Procedure days: Monday and Wednesday 7:30 AM to 4:00 PM  ______________________________________________________________________  Epidural Steroid Injection  An epidural steroid injection is a shot of steroid medicine, also called cortisone, and a numbing medicine that is given into the epidural space. This space is between the spinal cord and the bones of the back. This shot helps relieve pain caused by an irritated or swollen nerve root. The pain relief you get from the injection depends on the cause of your condition and how long your pain lasts. You may have a period of slightly more pain after your injection, before the steroid medicine takes effect. This medicine usually starts working within 1-3 days. In some cases, you might need 7-10 days to feel the full effect. Tell your health care provider about: Any allergies you have. All medicines you are taking, including vitamins, herbs, eye drops, creams, and over-the-counter medicines. Any problems you or family members have had with anesthesia. Any bleeding problems you have. Any surgeries you have had. Any medical conditions you have. Whether you are pregnant or may be pregnant. What are the risks? Your  health care provider will talk with you about risks. These may include: Headache. Bleeding. Infection. Allergic reaction to medicines or dyes. Nerve damage. Not being able to move (paralysis). This is rare. What happens before the procedure? Medicines You may be given medicines to lower anxiety. Ask your provider about: Changing or stopping your regular medicines. These include any diabetes medicines or blood thinners you take. Taking medicines such as aspirin and ibuprofen. These medicines can thin your blood. Do not take them unless your provider tells you to. Taking over-the-counter medicines, vitamins, herbs, and supplements. General instructions Follow instructions from your provider about what you may eat and drink. Ask your provider what steps will be taken to help prevent infection. If you will be going home right after the procedure, plan to have a responsible adult: Take you home from the hospital or clinic. You will not be allowed to drive. Care for you for the time you are told. What happens during the procedure?  An IV will be inserted into one of your veins. You may be given a sedative to help you relax. You will be asked to sit or lie on your side. The injection site will be cleaned. An X-ray machine will be used to guide the needle close to the nerve that is causing pain. A needle will be put through your skin into the epidural space. This may cause you some discomfort. Contrast dye may be injected at the site to make sure that the steroid medicine will be sent to the exact place it needs to go. The steroid  medicine and a numbing medicine will be injected into the epidural space for pain relief. The needle will be removed. A bandage (dressing) will be put over the injection site. The procedure may vary among providers and hospitals. What happens after the procedure? Your blood pressure, heart rate, breathing rate, and blood oxygen level will be monitored until you  leave the hospital or clinic. Your IV will be removed. Your arm or leg may feel weak or numb for a few hours. This information is not intended to replace advice given to you by your health care provider. Make sure you discuss any questions you have with your health care provider. Document Revised: 04/25/2022 Document Reviewed: 04/25/2022 Elsevier Patient Education  2024 [employer].

## 2023-03-05 NOTE — Progress Notes (Signed)
PROVIDER NOTE: Information contained herein reflects review and annotations entered in association with encounter. Interpretation of such information and data should be left to medically-trained personnel. Information provided to patient can be located elsewhere in the medical record under "Patient Instructions". Document created using STT-dictation technology, any transcriptional errors that may result from process are unintentional.    Patient: Shawn Crawford  Service Category: E/M  Provider: Edward Jolly, MD  DOB: May 28, 1961  DOS: 03/05/2023  Referring Provider: Brett Albino*  MRN: [id number]  Specialty: Interventional Pain Management  PCP: Ronnald Ramp, MD  Type: Established Patient  Setting: Ambulatory outpatient    Location: Office  Delivery: Face-to-face     HPI  Mr. BRUNSON CIRIACO, a 62 y.o. year old male, is here today because of his No primary diagnosis found.. Mr. Lycett primary complain today is Back Pain (lower) Last encounter: My last encounter with him was on 11/20/22 Pertinent problems: Mr. Parrott has Arteriosclerosis of coronary artery; Chronic radicular lumbar pain; Benign essential HTN; Chronic pain syndrome; Chronic midline low back pain without sciatica; Lumbar facet arthropathy; Lumbar degenerative disc disease; and Piriformis muscle pain on their pertinent problem list. Pain Assessment: Severity of Chronic pain is reported as a 3 /10. Location: Back Lower/through buttocks, down backs of legs to behind knees bilat. Onset: More than a month ago. Quality: Aching. Timing: Constant. Modifying factor(s): meds. Vitals:  height is 5\' 11"  (1.803 m) and weight is 156 lb (70.8 kg). His temporal temperature is 97.2 F (36.2 C) (abnormal). His blood pressure is 135/87 and his pulse is 83. His respiration is 16 and oxygen saturation is 100%.  BMI: Estimated body mass index is 21.76 kg/m as calculated from the following:   Height as of this encounter: 5\' 11"  (1.803 m).    Weight as of this encounter: 156 lb (70.8 kg).  Reason for encounter: medication management.    +LBP with radiation into bilateral buttocks and bilateral posterior legs -s/p L4/5 ESI in February of 2025 that provided approx 50% pain relief for 3 months -Medication refill of belbuca and tramadol today  Pharmacotherapy Assessment  Analgesic: Belbuca 600 mcg BID, Tramadol 50 mg q8 hrs breakthrough pain    Monitoring: [place] PMP: PDMP not reviewed this encounter.       Pharmacotherapy: No side-effects or adverse reactions reported. Compliance: No problems identified. Effectiveness: Clinically acceptable.  Nonah Mattes, RN  03/05/2023  8:14 AM  Sign when Signing Visit Nursing Pain Medication Assessment:  Safety precautions to be maintained throughout the outpatient stay will include: orient to surroundings, keep bed in low position, maintain call bell within reach at all times, provide assistance with transfer out of bed and ambulation.  Medication Inspection Compliance: Pill count conducted under aseptic conditions, in front of the patient. Neither the pills nor the bottle was removed from the patient's sight at any time. Once count was completed pills were immediately returned to the patient in their original bottle.  Medication #1: Buprenorphine (Suboxone) Pill/Patch Count:  16 of 60 patches remain Pill/Patch Appearance: Markings consistent with prescribed medication Bottle Appearance: Standard pharmacy container. Clearly labeled. Filled Date: 5 / 13 / 2024 Last Medication intake:  Today  Medication #2: Tramadol (Ultram) Pill/Patch Count:  17 of 90 pills remain Pill/Patch Appearance: Markings consistent with prescribed medication Bottle Appearance: Standard pharmacy container. Clearly labeled. Filled Date: 6 / 18 / 2024 Last Medication intake:  Today    No results found for: "CBDTHCR" No results found for: "D8THCCBX" No results found for: "  D9THCCBX"  UDS:  Summary  Date Value Ref  Range Status  08/26/2022 Note  Final    Comment:    ==================================================================== [employer] Select 13 (MW) ==================================================================== Test                             Result       Flag       Units  Drug Present and Declared for Prescription Verification   Buprenorphine                  20           EXPECTED   ng/mg creat   Norbuprenorphine               118          EXPECTED   ng/mg creat    Source of buprenorphine is a scheduled prescription medication.    Norbuprenorphine is an expected metabolite of buprenorphine.    Tramadol                       >2538        EXPECTED   ng/mg creat   O-Desmethyltramadol            >2538        EXPECTED   ng/mg creat   N-Desmethyltramadol            973          EXPECTED   ng/mg creat    Source of tramadol is a prescription medication. O-desmethyltramadol    and N-desmethyltramadol are expected metabolites of tramadol.  ==================================================================== Test                      Result    Flag   Units      Ref Range   Creatinine              197              mg/dL      >=16 ==================================================================== Declared Medications:  The flagging and interpretation on this report are based on the  following declared medications.  Unexpected results may arise from  inaccuracies in the declared medications.   **Note: The testing scope of this panel includes these medications:   Tramadol (Ultram)   **Note: The testing scope of this panel does not include small to  moderate amounts of these reported medications:   Buprenorphine (Belbuca)   **Note: The testing scope of this panel does not include the  following reported medications:   Amlodipine (Norvasc)  Aspirin  Desonide (Desowen)  Multivitamin  Rosuvastatin (Crestor)  Tadalafil (Cialis)  Testosterone  Vitamin B12  Zolpidem  (Ambien) ==================================================================== For clinical consultation, please call [phone]. ====================================================================       ROS  Constitutional: Denies any fever or chills Gastrointestinal: No reported hemesis, hematochezia, vomiting, or acute GI distress Musculoskeletal:  Low back pain with radiation into left leg=right leg Neurological: No reported episodes of acute onset apraxia, aphasia, dysarthria, agnosia, amnesia, paralysis, loss of coordination, or loss of consciousness  Medication Review  Buprenorphine HCl, Multiple Vitamin, amLODipine, aspirin EC, cyanocobalamin, desonide, eszopiclone, rosuvastatin, tadalafil, testosterone cypionate, traMADol, and zolpidem  History Review  Allergy: Mr. Shaner is allergic to penicillins. Drug: Mr. Slaski  reports no history of drug use. Alcohol:  reports current alcohol use. Tobacco:  reports that he has never smoked. He has  never been exposed to tobacco smoke. He has never used smokeless tobacco. Social: Mr. Vanandel  reports that he has never smoked. He has never been exposed to tobacco smoke. He has never used smokeless tobacco. He reports current alcohol use. He reports that he does not use drugs. Medical:  has a past medical history of Actinic keratosis, Coronary artery disease, Dysplastic nevus (09/06/2014), Dysplastic nevus (07/09/2015), cardiac catheterization, Hyperlipidemia, and Hypertension. Surgical: Mr. Dambrosi  has a past surgical history that includes Coronary artery bypass graft; Hernia repair; Appendectomy; Cardiac catheterization; Colonoscopy with propofol (N/A, 06/24/2018); Esophagogastroduodenoscopy (egd) with propofol (N/A, 06/24/2018); Exam under anesthesia with hemorrhoidectomy (N/A, 11/11/2018); Insertion of mesh (02/05/2022); and Finger surgery (Left). Family: family history includes Colon cancer in his sister; Diabetes in his father; Healthy in his son  and son; Hyperlipidemia in his mother; Hypertension in his father; Thyroid cancer in his sister.  Laboratory Chemistry Profile   Renal Lab Results  Component Value Date   BUN 13 06/23/2022   CREATININE 1.25 06/23/2022   BCR 10 06/23/2022   GFRAA 96 04/18/2020   GFRNONAA >60 01/29/2022    Hepatic Lab Results  Component Value Date   AST 23 01/28/2022   ALT 19 01/28/2022   ALBUMIN 4.4 06/23/2022   ALKPHOS 84 01/28/2022   LIPASE 23 06/14/2021    Electrolytes Lab Results  Component Value Date   NA 138 06/23/2022   K 4.8 06/23/2022   CL 97 06/23/2022   CALCIUM 9.2 06/23/2022   MG 2.0 01/28/2022   PHOS 2.7 (L) 06/23/2022    Bone Lab Results  Component Value Date   TESTOFREE 5.1 (L) 11/09/2019   TESTOSTERONE 263 (L) 08/16/2021    Inflammation (CRP: Acute Phase) (ESR: Chronic Phase) Lab Results  Component Value Date   CRP 6.0 08/19/2022   ESRSEDRATE 6 08/19/2022         Note: Above Lab results reviewed.   Physical Exam  General appearance: Well nourished, well developed, and well hydrated. In no apparent acute distress Mental status: Alert, oriented x 3 (person, place, & time)       Respiratory: No evidence of acute respiratory distress Eyes: PERLA Vitals: BP 135/87   Pulse 83   Temp (!) 97.2 F (36.2 C) (Temporal)   Resp 16   Ht 5\' 11"  (1.803 m)   Wt 156 lb (70.8 kg)   SpO2 100%   BMI 21.76 kg/m  BMI: Estimated body mass index is 21.76 kg/m as calculated from the following:   Height as of this encounter: 5\' 11"  (1.803 m).   Weight as of this encounter: 156 lb (70.8 kg). Ideal: Ideal body weight: 75.3 kg (166 lb 0.1 oz)  Low back pain with radiation into bilateral posterior legs in dermatomal fashion 5 out of 5 strength bilateral lower extremity: Plantar flexion, dorsiflexion, knee flexion, knee extension.   Assessment   Diagnosis Status  1. Lumbar radiculopathy   2. Chronic pain syndrome   3. Spinal stenosis, lumbar region, with neurogenic  claudication   4. Chronic radicular lumbar pain   5. Neuroforaminal stenosis of lumbar spine     Having a Flare-up Controlled Controlled     Plan of Care    Mr. DILLYN KILLEEN has a current medication list which includes the following long-term medication(s): amlodipine, rosuvastatin, tadalafil, testosterone cypionate, zolpidem, eszopiclone, and [START ON 03/09/2023] tramadol.  Pharmacotherapy (Medications Ordered): Meds ordered this encounter  Medications   Buprenorphine HCl (BELBUCA) 600 MCG FILM    Sig: Place  1 Film inside cheek every 12 (twelve) hours.    Dispense:  60 each    Refill:  2   traMADol (ULTRAM) 50 MG tablet    Sig: Take 1 tablet (50 mg total) by mouth every 8 (eight) hours as needed for severe pain. Month last 30 days.    Dispense:  90 tablet    Refill:  2    [place] STOP ACT - Not applicable. Fill one day early if pharmacy is closed on scheduled refill date. .   Orders Placed This Encounter  Procedures   Lumbar Epidural Injection    Standing Status:   Future    Standing Expiration Date:   06/05/2023    Scheduling Instructions:     Procedure: Interlaminar Lumbar Epidural Steroid injection (LESI)            Laterality: Midline     Sedation: Patient's choice.     Timeframe: ASAA    Order Specific Question:   Where will this procedure be performed?    Answer:   [facility]     Follow-up plan:   Return in about 4 weeks (around 04/02/2023) for L4/L5 ESI, in clinic [facility].      Recent Visits No visits were found meeting these conditions. Showing recent visits within past 90 days and meeting all other requirements Today's Visits Date Type Provider Dept  03/05/23 Office Visit Edward Jolly, MD [facility]  Showing today's visits and meeting all other requirements Future Appointments No visits were found meeting these conditions. Showing future appointments within next 90 days and meeting all other requirements  I discussed the assessment and  treatment plan with the patient. The patient was provided an opportunity to ask questions and all were answered. The patient agreed with the plan and demonstrated an understanding of the instructions.  Patient advised to call back or seek an in-person evaluation if the symptoms or condition worsens.  Duration of encounter: .  Total time on encounter, as per AMA guidelines included both the face-to-face and non-face-to-face time personally spent by the physician and/or other qualified health care professional(s) on the day of the encounter (includes time in activities that require the physician or other qualified health care professional and does not include time in activities normally performed by clinical staff). Physician's time may include the following activities when performed: Preparing to see the patient (e.g., pre-charting review of records, searching for previously ordered imaging, lab work, and nerve conduction tests) Review of prior analgesic pharmacotherapies. Reviewing PMP Interpreting ordered tests (e.g., lab work, imaging, nerve conduction tests) Performing post-procedure evaluations, including interpretation of diagnostic procedures Obtaining and/or reviewing separately obtained history Performing a medically appropriate examination and/or evaluation Counseling and educating the patient/family/caregiver Ordering medications, tests, or procedures Referring and communicating with other health care professionals (when not separately reported) Documenting clinical information in the electronic or other health record Independently interpreting results (not separately reported) and communicating results to the patient/ family/caregiver Care coordination (not separately reported)  Note by: Edward Jolly, MD Date: 03/05/2023; Time: 8:36 AM

## 2023-03-05 NOTE — Progress Notes (Signed)
Nursing Pain Medication Assessment:  Safety precautions to be maintained throughout the outpatient stay will include: orient to surroundings, keep bed in low position, maintain call bell within reach at all times, provide assistance with transfer out of bed and ambulation.  Medication Inspection Compliance: Pill count conducted under aseptic conditions, in front of the patient. Neither the pills nor the bottle was removed from the patient's sight at any time. Once count was completed pills were immediately returned to the patient in their original bottle.  Medication #1: Buprenorphine (Suboxone) Pill/Patch Count:  16 of 60 patches remain Pill/Patch Appearance: Markings consistent with prescribed medication Bottle Appearance: Standard pharmacy container. Clearly labeled. Filled Date: 5 / 13 / 2024 Last Medication intake:  Today  Medication #2: Tramadol (Ultram) Pill/Patch Count:  17 of 90 pills remain Pill/Patch Appearance: Markings consistent with prescribed medication Bottle Appearance: Standard pharmacy container. Clearly labeled. Filled Date: 6 / 21 / 2024 Last Medication intake:  Today

## 2023-03-11 ENCOUNTER — Other Ambulatory Visit: Payer: Self-pay | Admitting: Family Medicine

## 2023-03-11 DIAGNOSIS — R7989 Other specified abnormal findings of blood chemistry: Secondary | ICD-10-CM

## 2023-03-12 ENCOUNTER — Other Ambulatory Visit: Payer: Self-pay | Admitting: Family Medicine

## 2023-03-12 DIAGNOSIS — R7989 Other specified abnormal findings of blood chemistry: Secondary | ICD-10-CM

## 2023-03-12 MED ORDER — TESTOSTERONE CYPIONATE 200 MG/ML IM SOLN
INTRAMUSCULAR | 0 refills | Status: AC
Start: 2023-03-12 — End: ?

## 2023-03-30 ENCOUNTER — Ambulatory Visit
Payer: 59 | Attending: Student in an Organized Health Care Education/Training Program | Admitting: Student in an Organized Health Care Education/Training Program

## 2023-03-30 ENCOUNTER — Encounter: Payer: Self-pay | Admitting: Student in an Organized Health Care Education/Training Program

## 2023-03-30 ENCOUNTER — Ambulatory Visit
Admission: RE | Admit: 2023-03-30 | Discharge: 2023-03-30 | Disposition: A | Discharge status: Home / Self Care | Payer: 59 | Source: Ambulatory Visit | Attending: Student in an Organized Health Care Education/Training Program | Admitting: Student in an Organized Health Care Education/Training Program

## 2023-03-30 DIAGNOSIS — M5416 Radiculopathy, lumbar region: Secondary | ICD-10-CM | POA: Diagnosis present

## 2023-03-30 MED ORDER — SODIUM CHLORIDE 0.9% FLUSH
2.0000 mL | Freq: Once | INTRAVENOUS | Status: AC
  Administered 2023-03-30: 2 mL

## 2023-03-30 MED ORDER — LIDOCAINE HCL 2 % IJ SOLN
20.0000 mL | Freq: Once | INTRAMUSCULAR | Status: AC
  Administered 2023-03-30: 400 mg
  Filled 2023-03-30: qty 20

## 2023-03-30 MED ORDER — ROPIVACAINE HCL 2 MG/ML IJ SOLN
2.0000 mL | Freq: Once | INTRAMUSCULAR | Status: AC
  Administered 2023-03-30: 2 mL via EPIDURAL
  Filled 2023-03-30: qty 20

## 2023-03-30 MED ORDER — IOHEXOL 180 MG/ML  SOLN
10.0000 mL | Freq: Once | INTRAMUSCULAR | Status: AC
  Administered 2023-03-30: 10 mL via EPIDURAL
  Filled 2023-03-30: qty 20

## 2023-03-30 MED ORDER — DEXAMETHASONE SODIUM PHOSPHATE 10 MG/ML IJ SOLN
10.0000 mg | Freq: Once | INTRAMUSCULAR | Status: AC
  Administered 2023-03-30: 10 mg
  Filled 2023-03-30: qty 1

## 2023-03-30 NOTE — Patient Instructions (Signed)
Pain Management Discharge Instructions  General Discharge Instructions :  If you need to reach your doctor call: Monday-Friday 8:00 am - 4:00 pm at [phone] or toll free [phone].  After clinic hours [phone] to have operator reach doctor.  Bring all of your medication bottles to all your appointments in the pain clinic.  To cancel or reschedule your appointment with Pain Management please remember to call 24 hours in advance to avoid a fee.  Refer to the educational materials which you have been given on: General Risks, I had my Procedure. Discharge Instructions, Post Sedation.  Post Procedure Instructions:  The drugs you were given will stay in your system until tomorrow, so for the next 24 hours you should not drive, make any legal decisions or drink any alcoholic beverages.  You may eat anything you prefer, but it is better to start with liquids then soups and crackers, and gradually work up to solid foods.  Please notify your doctor immediately if you have any unusual bleeding, trouble breathing or pain that is not related to your normal pain.  Depending on the type of procedure that was done, some parts of your body may feel week and/or numb.  This usually clears up by tonight or the next day.  Walk with the use of an assistive device or accompanied by an adult for the 24 hours.  You may use ice on the affected area for the first 24 hours.  Put ice in a Ziploc bag and cover with a towel and place against area 15 minutes on 15 minutes off.  You may switch to heat after 24 hours.Epidural Steroid Injection Patient Information  Description: The epidural space surrounds the nerves as they exit the spinal cord.  In some patients, the nerves can be compressed and inflamed by a bulging disc or a tight spinal canal (spinal stenosis).  By injecting steroids into the epidural space, we can bring irritated nerves into direct contact with a potentially helpful medication.  These  steroids act directly on the irritated nerves and can reduce swelling and inflammation which often leads to decreased pain.  Epidural steroids may be injected anywhere along the spine and from the neck to the low back depending upon the location of your pain.   After numbing the skin with local anesthetic (like Novocaine), a small needle is passed into the epidural space slowly.  You may experience a sensation of pressure while this is being done.  The entire block usually last less than 10 minutes.  Conditions which may be treated by epidural steroids:  Low back and leg pain Neck and arm pain Spinal stenosis Post-laminectomy syndrome Herpes zoster (shingles) pain Pain from compression fractures  Preparation for the injection:  Do not eat any solid food or dairy products within 8 hours of your appointment.  You may drink clear liquids up to 3 hours before appointment.  Clear liquids include water, black coffee, juice or soda.  No milk or cream please. You may take your regular medication, including pain medications, with a sip of water before your appointment  Diabetics should hold regular insulin (if taken separately) and take 1/2 normal NPH dos the morning of the procedure.  Carry some sugar containing items with you to your appointment. A driver must accompany you and be prepared to drive you home after your procedure.  Bring all your current medications with your. An IV may be inserted and sedation may be given at the discretion of the physician.     A blood pressure cuff, EKG and other monitors will often be applied during the procedure.  Some patients may need to have extra oxygen administered for a short period. You will be asked to provide medical information, including your allergies, prior to the procedure.  We must know immediately if you are taking blood thinners (like Coumadin/Warfarin)  Or if you are allergic to IV iodine contrast (dye). We must know if you could possible be  pregnant.  Possible side-effects: Bleeding from needle site Infection (rare, may require surgery) Nerve injury (rare) Numbness & tingling (temporary) Difficulty urinating (rare, temporary) Spinal headache ( a headache worse with upright posture) Light -headedness (temporary) Pain at injection site (several days) Decreased blood pressure (temporary) Weakness in arm/leg (temporary) Pressure sensation in back/neck (temporary)  Call if you experience: Fever/chills associated with headache or increased back/neck pain. Headache worsened by an upright position. New onset weakness or numbness of an extremity below the injection site Hives or difficulty breathing (go to the emergency room) Inflammation or drainage at the infection site Severe back/neck pain Any new symptoms which are concerning to you  Please note:  Although the local anesthetic injected can often make your back or neck feel good for several hours after the injection, the pain will likely return.  It takes 3-7 days for steroids to work in the epidural space.  You may not notice any pain relief for at least that one week.  If effective, we will often do a series of three injections spaced 3-6 weeks apart to maximally decrease your pain.  After the initial series, we generally will wait several months before considering a repeat injection of the same type.  If you have any questions, please call [phone] [facility] 

## 2023-03-30 NOTE — Progress Notes (Signed)
PROVIDER NOTE: Interpretation of information contained herein should be left to medically-trained personnel. Specific patient instructions are provided elsewhere under "Patient Instructions" section of medical record. This document was created in part using STT-dictation technology, any transcriptional errors that may result from this process are unintentional.  Patient: Shawn Crawford Type: Established DOB: 23-Jun-1961 MRN: [id number] PCP: Ronnald Ramp, MD  Service: Procedure DOS: 03/30/2023 Setting: Ambulatory Location: Ambulatory outpatient facility Delivery: Face-to-face Provider: Edward Jolly, MD Specialty: Interventional Pain Management Specialty designation: 09 Location: Outpatient facility Ref. Prov.: Edward Jolly, MD    Primary Reason for Visit: Interventional Pain Management Treatment. CC: Back Pain   Procedure:           Type: Lumbar epidural steroid injection (LESI) (interlaminar) #2 (2024)    Laterality: Midline   Level:  L4-5 Level.  Imaging: Fluoroscopic guidance         Anesthesia: Local anesthesia (1-2% Lidocaine) DOS: 03/30/2023  Performed by: Edward Jolly, MD  Purpose: Diagnostic/Therapeutic Indications: Lumbar radicular pain of intraspinal etiology of more than 4 weeks that has failed to respond to conservative therapy and is severe enough to impact quality of life or function. 1. Lumbar radiculopathy    NAS-11 Pain score:   Pre-procedure: 3 /10   Post-procedure: 3 /10      Position / Prep / Materials:  Position: Prone w/ head of the table raised (slight reverse trendelenburg) to facilitate breathing.  Prep solution: DuraPrep (Iodine Povacrylex [0.7% available iodine] and Isopropyl Alcohol, 74% w/w) Prep Area: Entire Posterior Lumbar Region from lower scapular tip down to mid buttocks area and from flank to flank. Materials:  Tray: Epidural tray Needle(s):  Type: Epidural needle (Tuohy) Gauge (G):  22 Length: Regular (3.5-in) Qty: 1  Pre-op  H&P Assessment:  Shawn Crawford is a 62 y.o. (year old), male patient, seen today for interventional treatment. He  has a past surgical history that includes Coronary artery bypass graft; Hernia repair; Appendectomy; Cardiac catheterization; Colonoscopy with propofol (N/A, 06/24/2018); Esophagogastroduodenoscopy (egd) with propofol (N/A, 06/24/2018); Exam under anesthesia with hemorrhoidectomy (N/A, 11/11/2018); Insertion of mesh (02/05/2022); and Finger surgery (Left). Shawn Crawford has a current medication list which includes the following prescription(s): amlodipine, aspirin ec, [START ON 04/12/2023] belbuca, desonide, eszopiclone, multiple vitamin, b-d disp needle 25gx1", rosuvastatin, b-d 3cc luer-lok syr 21gx1-1/2, tadalafil, testosterone cypionate, tramadol, cyanocobalamin, and zolpidem. His primarily concern today is the Back Pain  Initial Vital Signs:  Pulse/HCG Rate: 93ECG Heart Rate: 93 Temp:  98.8 F (37.1 C) Resp: 16 BP: 114/77 SpO2: 98 %  BMI: Estimated body mass index is 21.9 kg/m as calculated from the following:   Height as of this encounter: 5\' 11"  (1.803 m).   Weight as of this encounter: 157 lb (71.2 kg).  Risk Assessment: Allergies: Reviewed. He is allergic to penicillins.  Allergy Precautions: None required Coagulopathies: Reviewed. None identified.  Blood-thinner therapy: None at this time Active Infection(s): Reviewed. None identified. Shawn Crawford is afebrile  Site Confirmation: Shawn Crawford was asked to confirm the procedure and laterality before marking the site Procedure checklist: Completed Consent: Before the procedure and under the influence of no sedative(s), amnesic(s), or anxiolytics, the patient was informed of the treatment options, risks and possible complications. To fulfill our ethical and legal obligations, as recommended by the American Medical Association's Code of Ethics, I have informed the patient of my clinical impression; the nature and purpose of the treatment or  procedure; the risks, benefits, and possible complications of the intervention; the alternatives, including doing  nothing; the risk(s) and benefit(s) of the alternative treatment(s) or procedure(s); and the risk(s) and benefit(s) of doing nothing. The patient was provided information about the general risks and possible complications associated with the procedure. These may include, but are not limited to: failure to achieve desired goals, infection, bleeding, organ or nerve damage, allergic reactions, paralysis, and death. In addition, the patient was informed of those risks and complications associated to Spine-related procedures, such as failure to decrease pain; infection (i.e.: Meningitis, epidural or intraspinal abscess); bleeding (i.e.: epidural hematoma, subarachnoid hemorrhage, or any other type of intraspinal or peri-dural bleeding); organ or nerve damage (i.e.: Any type of peripheral nerve, nerve root, or spinal cord injury) with subsequent damage to sensory, motor, and/or autonomic systems, resulting in permanent pain, numbness, and/or weakness of one or several areas of the body; allergic reactions; (i.e.: anaphylactic reaction); and/or death. Furthermore, the patient was informed of those risks and complications associated with the medications. These include, but are not limited to: allergic reactions (i.e.: anaphylactic or anaphylactoid reaction(s)); adrenal axis suppression; blood sugar elevation that in diabetics may result in ketoacidosis or comma; water retention that in patients with history of congestive heart failure may result in shortness of breath, pulmonary edema, and decompensation with resultant heart failure; weight gain; swelling or edema; medication-induced neural toxicity; particulate matter embolism and blood vessel occlusion with resultant organ, and/or nervous system infarction; and/or aseptic necrosis of one or more joints. Finally, the patient was informed that Medicine is  not an exact science; therefore, there is also the possibility of unforeseen or unpredictable risks and/or possible complications that may result in a catastrophic outcome. The patient indicated having understood very clearly. We have given the patient no guarantees and we have made no promises. Enough time was given to the patient to ask questions, all of which were answered to the patient's satisfaction. Shawn Crawford has indicated that he wanted to continue with the procedure. Attestation: I, the ordering provider, attest that I have discussed with the patient the benefits, risks, side-effects, alternatives, likelihood of achieving goals, and potential problems during recovery for the procedure that I have provided informed consent. Date  Time: 03/30/2023 10:45 AM  Pre-Procedure Preparation:  Monitoring: As per clinic protocol. Respiration, ETCO2, SpO2, BP, heart rate and rhythm monitor placed and checked for adequate function Safety Precautions: Patient was assessed for positional comfort and pressure points before starting the procedure. Time-out: I initiated and conducted the "Time-out" before starting the procedure, as per protocol. The patient was asked to participate by confirming the accuracy of the "Time Out" information. Verification of the correct person, site, and procedure were performed and confirmed by me, the nursing staff, and the patient. "Time-out" conducted as per Joint Commission's Universal Protocol (UP.01.01.01). Time: 1123  Description/Narrative of Procedure:          Target: Epidural space via interlaminar opening, initially targeting the lower laminar border of the superior vertebral body. Region: Lumbar Approach: Percutaneous paravertebral  Rationale (medical necessity): procedure needed and proper for the diagnosis and/or treatment of the patient's medical symptoms and needs. Procedural Technique Safety Precautions: Aspiration looking for blood return was conducted prior to  all injections. At no point did we inject any substances, as a needle was being advanced. No attempts were made at seeking any paresthesias. Safe injection practices and needle disposal techniques used. Medications properly checked for expiration dates. SDV (single dose vial) medications used. Description of the Procedure: Protocol guidelines were followed. The procedure needle was introduced through  the skin, ipsilateral to the reported pain, and advanced to the target area. Bone was contacted and the needle walked caudad, until the lamina was cleared. The epidural space was identified using "loss-of-resistance technique" with 2-3 ml of PF-NaCl (0.9% NSS), in a 5cc LOR glass syringe.  6cc solution made of 3 cc of preservative-free saline, 2 cc of 0.2% ropivacaine, 1 cc of Decadron 10 mg/cc.   Vitals:   03/30/23 1059 03/30/23 1118 03/30/23 1123 03/30/23 1127  BP: 114/77 (!) 155/98 (!) 158/94 (!) 147/88  Pulse: 93     Resp: 16 18 16 15   Temp: 98.8 F (37.1 C)     SpO2: 98% 98% 98% 98%  Weight: 157 lb (71.2 kg)     Height: 5\' 11"  (1.803 m)       Start Time: 1123 hrs. End Time:   hrs.  Imaging Guidance (Spinal):          Type of Imaging Technique: Fluoroscopy Guidance (Spinal) Indication(s): Assistance in needle guidance and placement for procedures requiring needle placement in or near specific anatomical locations not easily accessible without such assistance. Exposure Time: Please see nurses notes. Contrast: Before injecting any contrast, we confirmed that the patient did not have an allergy to iodine, shellfish, or radiological contrast. Once satisfactory needle placement was completed at the desired level, radiological contrast was injected. Contrast injected under live fluoroscopy. No contrast complications. See chart for type and volume of contrast used. Fluoroscopic Guidance: I was personally present during the use of fluoroscopy. "Tunnel Vision Technique" used to obtain the best  possible view of the target area. Parallax error corrected before commencing the procedure. "Direction-depth-direction" technique used to introduce the needle under continuous pulsed fluoroscopy. Once target was reached, antero-posterior, oblique, and lateral fluoroscopic projection used confirm needle placement in all planes. Images permanently stored in EMR. Interpretation: I personally interpreted the imaging intraoperatively. Adequate needle placement confirmed in multiple planes. Appropriate spread of contrast into desired area was observed. No evidence of afferent or efferent intravascular uptake. No intrathecal or subarachnoid spread observed. Permanent images saved into the patient's record.  Antibiotic Prophylaxis:   Anti-infectives (From admission, onward)    None      Indication(s): None identified  Post-operative Assessment:  Post-procedure Vital Signs:  Pulse/HCG Rate: 9381 Temp:  98.8 F (37.1 C) Resp: 15 BP: (!) 147/88 SpO2: 98 %  EBL: None  Complications: No immediate post-treatment complications observed by team, or reported by patient.  Note: The patient tolerated the entire procedure well. A repeat set of vitals were taken after the procedure and the patient was kept under observation following institutional policy, for this type of procedure. Post-procedural neurological assessment was performed, showing return to baseline, prior to discharge. The patient was provided with post-procedure discharge instructions, including a section on how to identify potential problems. Should any problems arise concerning this procedure, the patient was given instructions to immediately contact us, at any time, without hesitation. In any case, we plan to contact the patient by telephone for a follow-up status report regarding this interventional procedure.  Comments:  No additional relevant information.  5 out of 5 strength bilateral lower extremity: Plantar flexion, dorsiflexion,  knee flexion, knee extension.   Plan of Care  Orders:  Orders Placed This Encounter  Procedures   DG PAIN CLINIC C-ARM 1-60 MIN NO REPORT    Intraoperative interpretation by procedural physician at [facility].    Standing Status:   Standing    Number of Occurrences:  1    Order Specific Question:   Reason for exam:    Answer:   Assistance in needle guidance and placement for procedures requiring needle placement in or near specific anatomical locations not easily accessible without such assistance.   Chronic Opioid Analgesic:  Belbuca 600 mcg BID, Tramadol 50 mg q8 hrs breakthrough pain    Medications ordered for procedure: Meds ordered this encounter  Medications   iohexol (OMNIPAQUE) 180 MG/ML injection 10 mL    Must be Myelogram-compatible. If not available, you may substitute with a water-soluble, non-ionic, hypoallergenic, myelogram-compatible radiological contrast medium.   lidocaine (XYLOCAINE) 2 % (with pres) injection 400 mg   sodium chloride flush (NS) 0.9 % injection 2 mL   ropivacaine (PF) 2 mg/mL (0.2%) (NAROPIN) injection 2 mL   dexamethasone (DECADRON) injection 10 mg   Medications administered: We administered iohexol, lidocaine, sodium chloride flush, ropivacaine (PF) 2 mg/mL (0.2%), and dexamethasone.  See the medical record for exact dosing, route, and time of administration.  Follow-up plan:   Return for Keep sch. appt.      Recent Visits Date Type Provider Dept  03/05/23 Office Visit Edward Jolly, MD [facility]  Showing recent visits within past 90 days and meeting all other requirements Today's Visits Date Type Provider Dept  03/30/23 Procedure visit Edward Jolly, MD [facility]  Showing today's visits and meeting all other requirements Future Appointments Date Type Provider Dept  06/02/23 Appointment Edward Jolly, MD [facility]  Showing future appointments within next 90 days and meeting all other  requirements  Disposition: Discharge home  Discharge (Date  Time): 03/30/2023; 1135 hrs.   Primary Care Physician: Ronnald Ramp, MD Location: [facility] Note by: Edward Jolly, MD Date: 03/30/2023; Time: 11:39 AM  Disclaimer:  Medicine is not an exact science. The only guarantee in medicine is that nothing is guaranteed. It is important to note that the decision to proceed with this intervention was based on the information collected from the patient. The Data and conclusions were drawn from the patient's questionnaire, the interview, and the physical examination. Because the information was provided in large part by the patient, it cannot be guaranteed that it has not been purposely or unconsciously manipulated. Every effort has been made to obtain as much relevant data as possible for this evaluation. It is important to note that the conclusions that lead to this procedure are derived in large part from the available data. Always take into account that the treatment will also be dependent on availability of resources and existing treatment guidelines, considered by other Pain Management Practitioners as being common knowledge and practice, at the time of the intervention. For Medico-Legal purposes, it is also important to point out that variation in procedural techniques and pharmacological choices are the acceptable norm. The indications, contraindications, technique, and results of the above procedure should only be interpreted and judged by a Board-Certified Interventional Pain Specialist with extensive familiarity and expertise in the same exact procedure and technique.

## 2023-03-31 ENCOUNTER — Telehealth: Payer: Self-pay | Admitting: *Deleted

## 2023-03-31 NOTE — Telephone Encounter (Signed)
Attempted to call for post procedure follow-up. Message left. 

## 2023-04-07 IMAGING — MR MR LUMBAR SPINE W/O CM
5 series · 31 of 48 positions shown · non-contrast
Comparison: Lumbar spine MRI 03/03/2014

CLINICAL DATA: Neurogenic claudication. Lumbar radiculopathy.
Worsening chronic low back pain radiating down both legs.

EXAM:
MRI LUMBAR SPINE WITHOUT CONTRAST
TECHNIQUE: Multiplanar, multisequence MR imaging of the lumbar spine was
performed. No intravenous contrast was administered.

[Series 5: T2 · sagittal · 4.0mm · 0.81mm/px · 7 of 17 slices shown (1 of 2)]
[im 1/17]
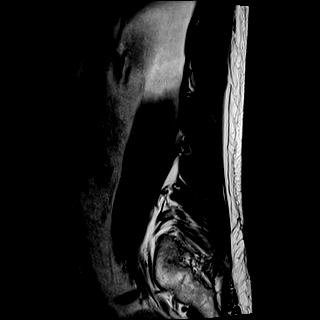
[im 3/17]
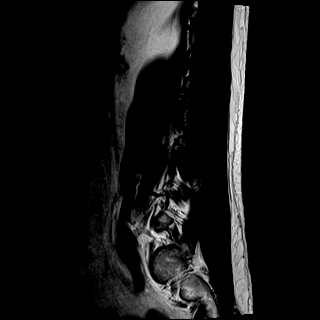
[im 6/17]
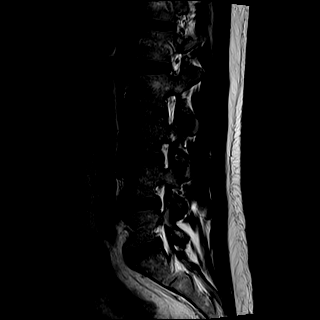
[im 9/17]
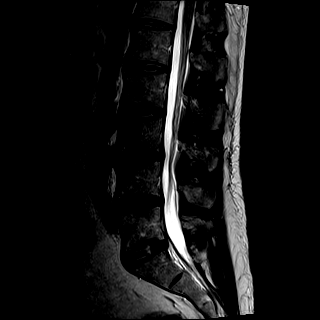
[im 11/17]
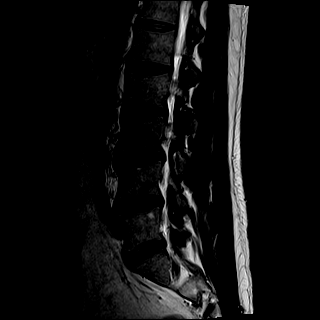
[im 14/17]
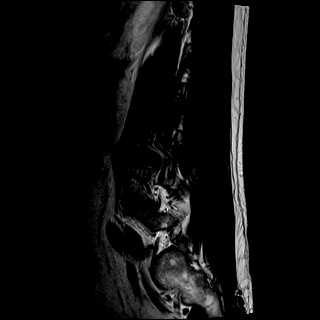
[im 17/17]
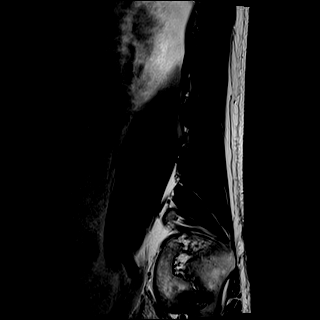

[Series 6: T1 · sagittal · 4.0mm · 0.81mm/px · 7 of 17 slices shown (1 of 2)]
[im 1/17]
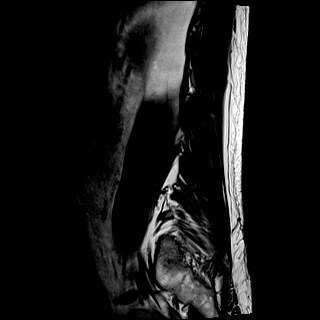
[im 3/17]
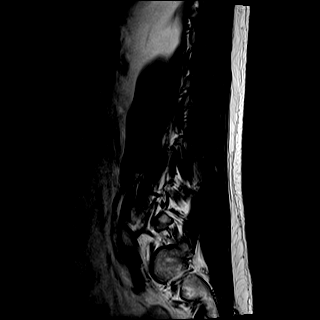
[im 6/17]
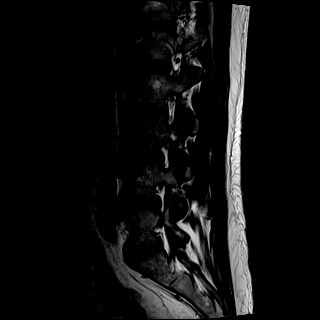
[im 9/17]
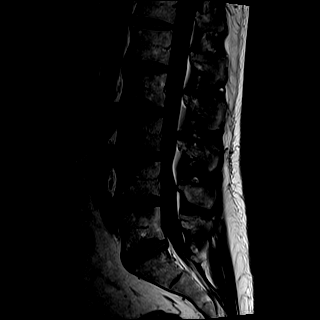
[im 11/17]
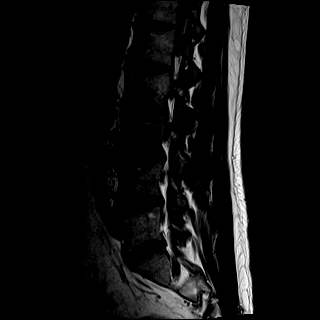
[im 14/17]
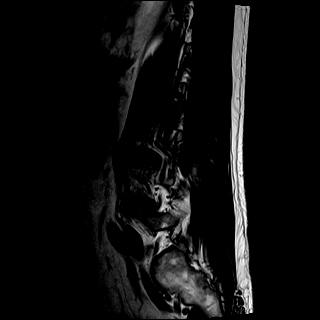
[im 17/17]
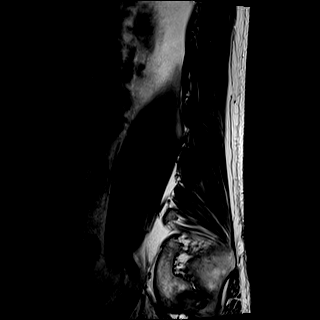

[Series 7: STIR · sagittal · 4.0mm · 0.41mm/px · 1 of 17 slices shown]
[im 1/17]
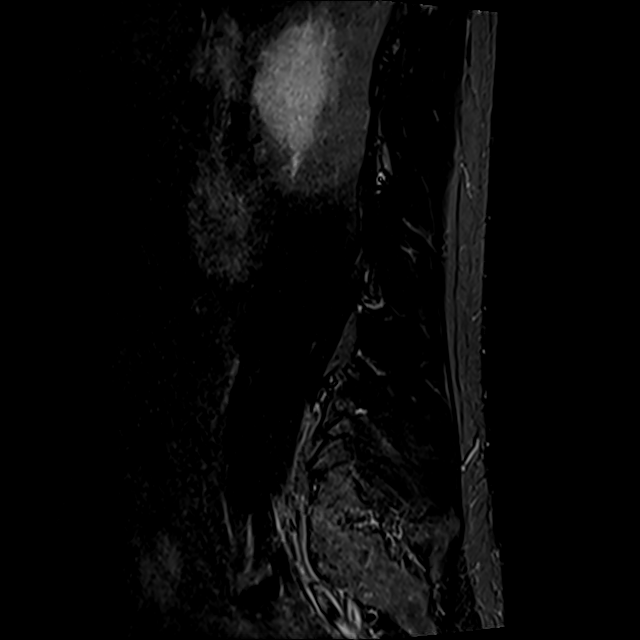

[Series 8: T2 · axial · 4.0mm · 0.78mm/px · z∈[-126,+97]mm · 8 of 38 slices shown (2 of 2)]
[im 1/38]
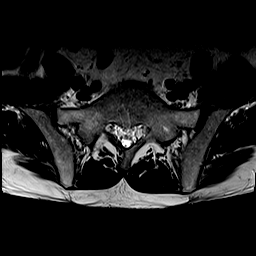
[im 6/38]
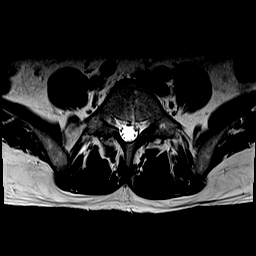
[im 12/38]
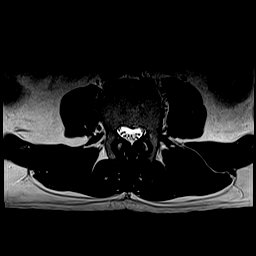
[im 18/38]
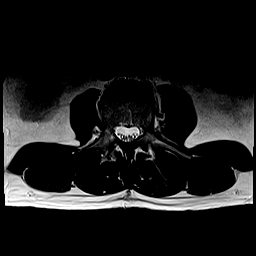
[im 20/38]
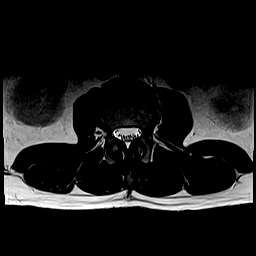
[im 26/38]
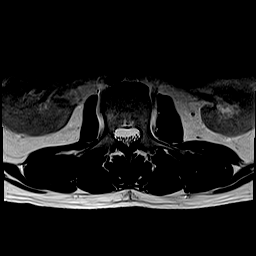
[im 32/38]
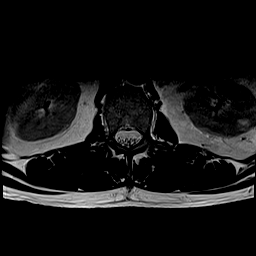
[im 38/38]
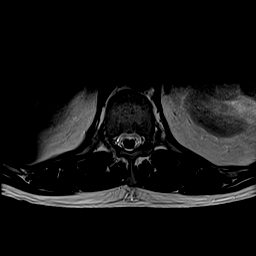

[Series 9: T1 · axial · 4.0mm · 0.39mm/px · z∈[-126,+97]mm · 8 of 38 slices shown (2 of 2)]
[im 1/38]
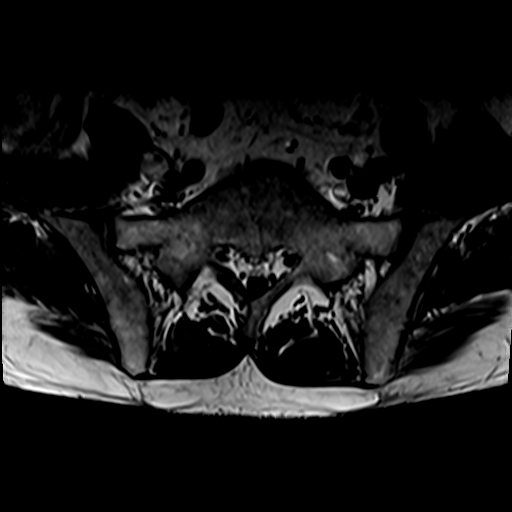
[im 6/38]
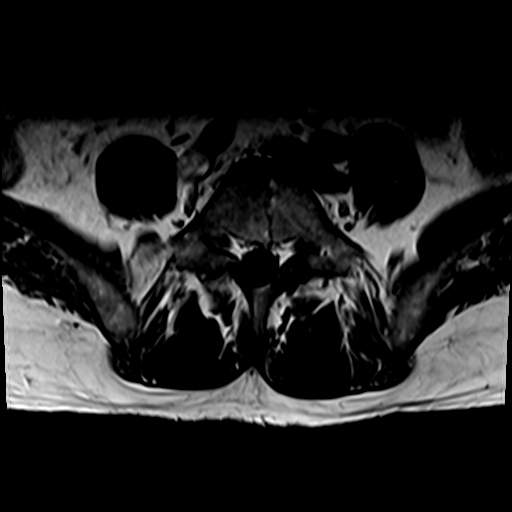
[im 12/38]
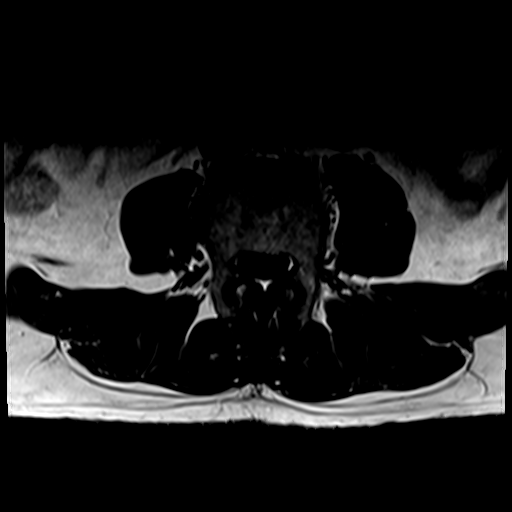
[im 18/38]
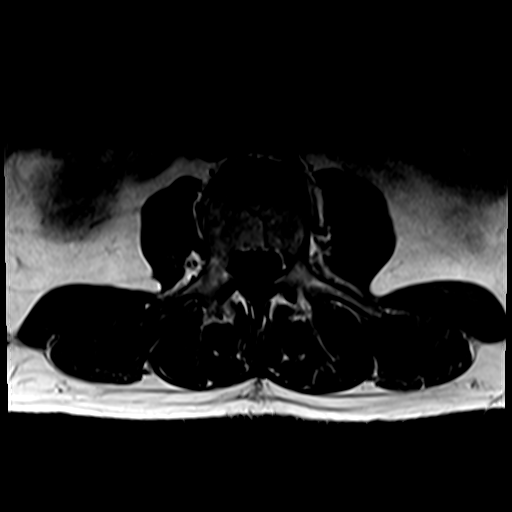
[im 20/38]
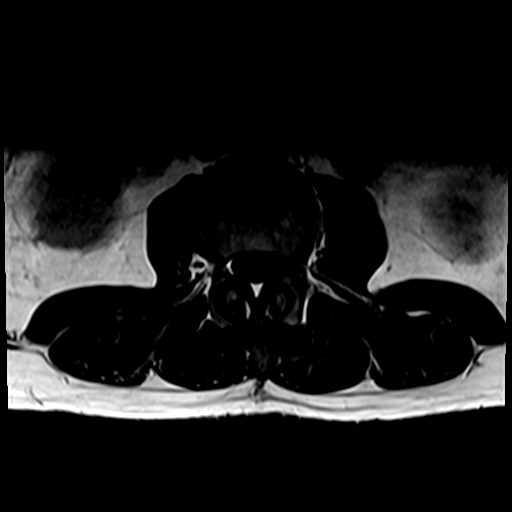
[im 26/38]
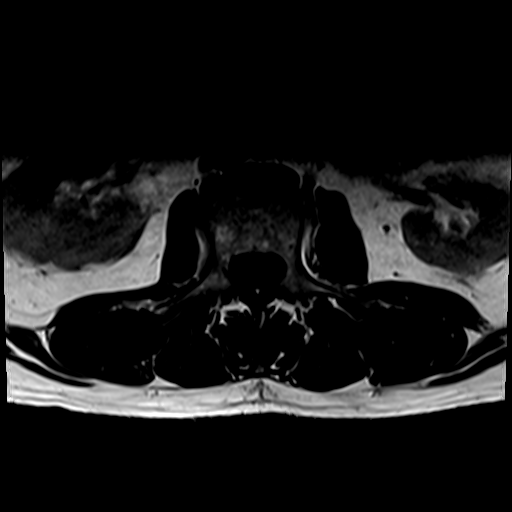
[im 32/38]
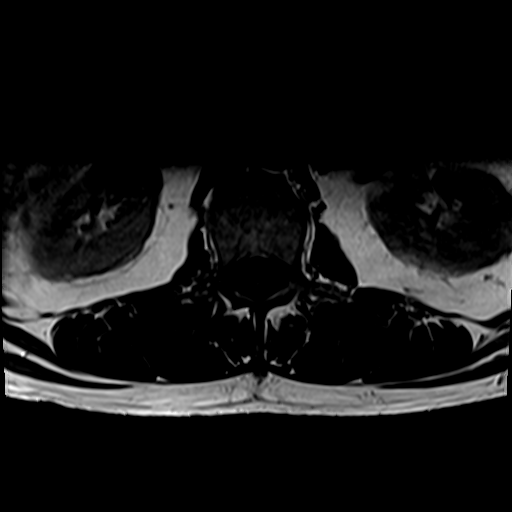
[im 38/38]
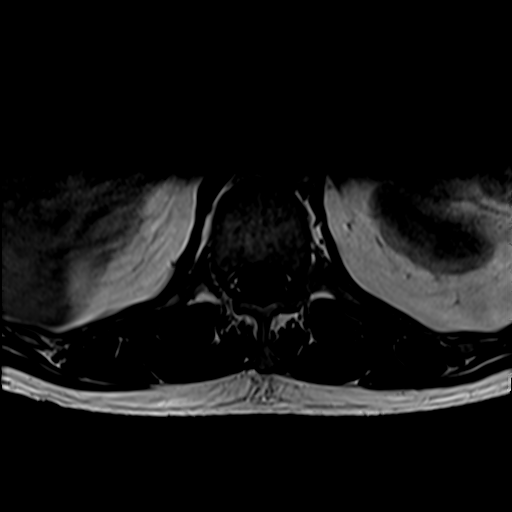

[31 of 48 positions shown; findings below may reference images not displayed]

FINDINGS: Segmentation:  Standard.

Alignment: Mild straightening of the normal lumbar lordosis. No
listhesis.

Vertebrae: No fracture, suspicious marrow lesion, or significant
marrow edema.

Conus medullaris and cauda equina: Conus extends to the L1 level.
Conus and cauda equina appear normal.

Paraspinal and other soft tissues: Small left renal cysts measuring
up to 1.3 cm.

Disc levels:

T12-L1 and L1-2: Negative.

L2-3: Trace disc bulging and mild facet hypertrophy without
stenosis, unchanged.

L3-4: New disc desiccation and mild disc space narrowing. Disc
bulging and mild facet and ligamentum flavum hypertrophy result in
mild spinal stenosis, mild left greater than right lateral recess
stenosis, and mild left neural foraminal stenosis, mildly progressed
from prior.

L4-5: Disc desiccation and mild disc space narrowing. Disc bulging
and mild facet hypertrophy result in mild right lateral recess
stenosis and mild left neural foraminal stenosis, stable to slightly
progressed. New left foraminal annular fissure.

L5-S1: Negative.
IMPRESSION: 1. Mildly progressive disc degeneration at L3-4 with mild spinal,
lateral recess, and left neural foraminal stenosis.
2. Mild lateral recess and neural foraminal stenosis at L4-5, stable
to slightly progressed.

## 2023-04-22 ENCOUNTER — Other Ambulatory Visit: Payer: Self-pay | Admitting: Family Medicine

## 2023-04-22 DIAGNOSIS — N529 Male erectile dysfunction, unspecified: Secondary | ICD-10-CM

## 2023-04-22 NOTE — Telephone Encounter (Signed)
[place] faxed refill request for the following medications:   tadalafil (CIALIS) 5 MG tablet     Please advise

## 2023-04-23 ENCOUNTER — Other Ambulatory Visit: Payer: Self-pay

## 2023-04-23 DIAGNOSIS — N529 Male erectile dysfunction, unspecified: Secondary | ICD-10-CM

## 2023-04-23 MED ORDER — TADALAFIL 5 MG PO TABS: 5.0000 mg | ORAL_TABLET | Freq: Every day | ORAL | Status: DC

## 2023-04-24 ENCOUNTER — Other Ambulatory Visit: Payer: Self-pay | Admitting: Family Medicine

## 2023-04-24 DIAGNOSIS — N529 Male erectile dysfunction, unspecified: Secondary | ICD-10-CM

## 2023-04-24 MED ORDER — TADALAFIL 5 MG PO TABS
5.0000 mg | ORAL_TABLET | Freq: Every day | ORAL | 0 refills | Status: AC
Start: 2023-04-24 — End: ?

## 2023-05-28 ENCOUNTER — Other Ambulatory Visit: Payer: Self-pay | Admitting: Student in an Organized Health Care Education/Training Program

## 2023-05-28 DIAGNOSIS — M5416 Radiculopathy, lumbar region: Secondary | ICD-10-CM

## 2023-05-28 DIAGNOSIS — G8929 Other chronic pain: Secondary | ICD-10-CM

## 2023-05-28 DIAGNOSIS — M48061 Spinal stenosis, lumbar region without neurogenic claudication: Secondary | ICD-10-CM

## 2023-05-28 DIAGNOSIS — M48062 Spinal stenosis, lumbar region with neurogenic claudication: Secondary | ICD-10-CM

## 2023-05-28 DIAGNOSIS — G894 Chronic pain syndrome: Secondary | ICD-10-CM

## 2023-06-02 ENCOUNTER — Ambulatory Visit
Payer: 59 | Attending: Student in an Organized Health Care Education/Training Program | Admitting: Student in an Organized Health Care Education/Training Program

## 2023-06-02 ENCOUNTER — Encounter: Payer: Self-pay | Admitting: Student in an Organized Health Care Education/Training Program

## 2023-06-02 VITALS — BP 128/81 | HR 89 | Temp 97.3°F | Resp 16 | Ht 71.0 in | Wt 156.0 lb

## 2023-06-02 DIAGNOSIS — M48061 Spinal stenosis, lumbar region without neurogenic claudication: Secondary | ICD-10-CM | POA: Insufficient documentation

## 2023-06-02 DIAGNOSIS — G894 Chronic pain syndrome: Secondary | ICD-10-CM | POA: Diagnosis present

## 2023-06-02 DIAGNOSIS — M5416 Radiculopathy, lumbar region: Secondary | ICD-10-CM | POA: Diagnosis present

## 2023-06-02 DIAGNOSIS — G8929 Other chronic pain: Secondary | ICD-10-CM | POA: Insufficient documentation

## 2023-06-02 DIAGNOSIS — M48062 Spinal stenosis, lumbar region with neurogenic claudication: Secondary | ICD-10-CM | POA: Insufficient documentation

## 2023-06-02 MED ORDER — TRAMADOL HCL 50 MG PO TABS: 50.0000 mg | ORAL_TABLET | Freq: Three times a day (TID) | ORAL | 2 refills | Status: DC | PRN

## 2023-06-02 MED ORDER — BELBUCA 600 MCG BU FILM
1.0000 | ORAL_FILM | Freq: Two times a day (BID) | BUCCAL | 2 refills | Status: DC
Start: 2023-06-07 — End: 2023-09-01

## 2023-06-02 NOTE — Progress Notes (Signed)
Nursing Pain Medication Assessment:  Safety precautions to be maintained throughout the outpatient stay will include: orient to surroundings, keep bed in low position, maintain call bell within reach at all times, provide assistance with transfer out of bed and ambulation.  Medication Inspection Compliance: Pill count conducted under aseptic conditions, in front of the patient. Neither the pills nor the bottle was removed from the patient's sight at any time. Once count was completed pills were immediately returned to the patient in their original bottle.  Medication: Tramadol (Ultram) Pill/Patch Count:  88 of 90 pills remain Pill/Patch Appearance: Markings consistent with prescribed medication Bottle Appearance: Standard pharmacy container. Clearly labeled. Filled Date: 08 / 08 / 2024 Last Medication intake:  Yesterday  Belbuca  600 mg  21/60 Filled 05/07/2023 today

## 2023-06-02 NOTE — Progress Notes (Signed)
PROVIDER NOTE: Information contained herein reflects review and annotations entered in association with encounter. Interpretation of such information and data should be left to medically-trained personnel. Information provided to patient can be located elsewhere in the medical record under "Patient Instructions". Document created using STT-dictation technology, any transcriptional errors that may result from process are unintentional.    Patient: Shawn Crawford  Service Category: E/M  Provider: Edward Jolly, MD  DOB: 1961/01/16  DOS: 06/02/2023  Referring Provider: Brett Albino*  MRN: [id number]  Specialty: Interventional Pain Management  PCP: Ronnald Ramp, MD  Type: Established Patient  Setting: Ambulatory outpatient    Location: Office  Delivery: Face-to-face     HPI  Shawn Crawford, a 62 y.o. year old male, is here today because of his Lumbar radiculopathy [M54.16]. Shawn Crawford primary complain today is Back Pain  Pertinent problems: Shawn Crawford has Arteriosclerosis of coronary artery; Chronic radicular lumbar pain; Benign essential HTN; Chronic pain syndrome; Chronic midline low back pain without sciatica; Lumbar facet arthropathy; Lumbar degenerative disc disease; and Piriformis muscle pain on their pertinent problem list. Pain Assessment: Severity of Chronic pain is reported as a 3 /10. Location: Back Lower/radiates down both legs to buttocks. Onset: More than a month ago. Quality: Aching. Timing: Constant. Modifying factor(s): meds. sleeping on side with pillow. Vitals:  height is 5\' 11"  (1.803 m) and weight is 156 lb (70.8 kg). His temperature is 97.3 F (36.3 C) (abnormal). His blood pressure is 128/81 and his pulse is 89. His respiration is 16 and oxygen saturation is 100%.  BMI: Estimated body mass index is 21.76 kg/m as calculated from the following:   Height as of this encounter: 5\' 11"  (1.803 m).   Weight as of this encounter: 156 lb (70.8 kg). Last encounter:  03/05/2023. Last procedure: 03/30/2023.  Reason for encounter: both, medication management and post-procedure evaluation and assessment.  Shawn Crawford would also like to discuss spinal cord stimulation.   Shawn Crawford is endorsing increased pain in his lower back with radiation into bilateral buttocks and also his legs stopping above his knees.  He states that the pain has increased and he is now interested in considering spinal cord stimulation.  We had a discussion regarding spinal cord stimulation last year in August 2023 after he had met with Dr. Marcell Barlow with neurosurgery who did not recommend surgery and recommended a spinal cord stimulator.  In the interim, the patient has had physical therapy and is also completed lumbar epidural steroid injections with short-term benefit.  He is interested in longer-term more definitive options for his pain management.  We had a long discussion today regarding spinal cord stimulation.  He has already completed his psych eval so we will make sure that is evaluated and on file.  He had his thoracic MRI completed a little over a year ago so no need to repeat.     Post-procedure evaluation   Type: Lumbar epidural steroid injection (LESI) (interlaminar) #2 (2024)    Laterality: Midline   Level:  L4-5 Level.  Imaging: Fluoroscopic guidance         Anesthesia: Local anesthesia (1-2% Lidocaine) DOS: 03/30/2023  Performed by: Edward Jolly, MD  Purpose: Diagnostic/Therapeutic Indications: Lumbar radicular pain of intraspinal etiology of more than 4 weeks that has failed to respond to conservative therapy and is severe enough to impact quality of life or function. 1. Lumbar radiculopathy    NAS-11 Pain score:   Pre-procedure: 3 /10   Post-procedure: 3 /10  Effectiveness:  Initial hour after procedure: 75 %  Subsequent 4-6 hours post-procedure: 75 %  Analgesia past initial 6 hours: 30 % (lasting 2 weeks)  Ongoing improvement:  Analgesic:  <20% Function: Back to  baseline ROM: Back to baseline   Pharmacotherapy Assessment  Analgesic: Belbuca 600 mcg BID, Tramadol 50 mg q8 hrs breakthrough pain    Monitoring: [place] PMP: PDMP reviewed during this encounter.       Pharmacotherapy: No side-effects or adverse reactions reported. Compliance: No problems identified. Effectiveness: Clinically acceptable.  Valerie Salts, RN  06/02/2023  8:18 AM  Sign when Signing Visit Nursing Pain Medication Assessment:  Safety precautions to be maintained throughout the outpatient stay will include: orient to surroundings, keep bed in low position, maintain call bell within reach at all times, provide assistance with transfer out of bed and ambulation.  Medication Inspection Compliance: Pill count conducted under aseptic conditions, in front of the patient. Neither the pills nor the bottle was removed from the patient's sight at any time. Once count was completed pills were immediately returned to the patient in their original bottle.  Medication: Tramadol (Ultram) Pill/Patch Count:  88 of 90 pills remain Pill/Patch Appearance: Markings consistent with prescribed medication Bottle Appearance: Standard pharmacy container. Clearly labeled. Filled Date: 08 / 08 / 2024 Last Medication intake:  Yesterday  Belbuca  600 mg  21/60 Filled 05/07/2023 today    No results found for: "CBDTHCR" No results found for: "D8THCCBX" No results found for: "D9THCCBX"  UDS:  Summary  Date Value Ref Range Status  08/26/2022 Note  Final    Comment:    ==================================================================== [employer] Select 13 (MW) ==================================================================== Test                             Result       Flag       Units  Drug Present and Declared for Prescription Verification   Buprenorphine                  20           EXPECTED   ng/mg creat   Norbuprenorphine               118          EXPECTED   ng/mg creat    Source of  buprenorphine is a scheduled prescription medication.    Norbuprenorphine is an expected metabolite of buprenorphine.    Tramadol                       >2538        EXPECTED   ng/mg creat   O-Desmethyltramadol            >2538        EXPECTED   ng/mg creat   N-Desmethyltramadol            973          EXPECTED   ng/mg creat    Source of tramadol is a prescription medication. O-desmethyltramadol    and N-desmethyltramadol are expected metabolites of tramadol.  ==================================================================== Test                      Result    Flag   Units      Ref Range   Creatinine              197  mg/dL      >=66 ==================================================================== Declared Medications:  The flagging and interpretation on this report are based on the  following declared medications.  Unexpected results may arise from  inaccuracies in the declared medications.   **Note: The testing scope of this panel includes these medications:   Tramadol (Ultram)   **Note: The testing scope of this panel does not include small to  moderate amounts of these reported medications:   Buprenorphine (Belbuca)   **Note: The testing scope of this panel does not include the  following reported medications:   Amlodipine (Norvasc)  Aspirin  Desonide (Desowen)  Multivitamin  Rosuvastatin (Crestor)  Tadalafil (Cialis)  Testosterone  Vitamin B12  Zolpidem (Ambien) ==================================================================== For clinical consultation, please call [phone]. ====================================================================       ROS  Constitutional: Denies any fever or chills Gastrointestinal: No reported hemesis, hematochezia, vomiting, or acute GI distress Musculoskeletal:  Low back and bilateral leg pain Neurological: No reported episodes of acute onset apraxia, aphasia, dysarthria, agnosia, amnesia, paralysis,  loss of coordination, or loss of consciousness  Medication Review  Buprenorphine HCl, Multiple Vitamin, NEEDLE (DISP) 25 G, SYRINGE-NEEDLE (DISP) 3 ML, amLODipine, aspirin EC, cyanocobalamin, desonide, eszopiclone, rosuvastatin, tadalafil, testosterone cypionate, traMADol, and zolpidem  History Review  Allergy: Shawn Crawford is allergic to penicillins. Drug: Shawn Crawford  reports no history of drug use. Alcohol:  reports current alcohol use. Tobacco:  reports that he has never smoked. He has never been exposed to tobacco smoke. He has never used smokeless tobacco. Social: Shawn Crawford  reports that he has never smoked. He has never been exposed to tobacco smoke. He has never used smokeless tobacco. He reports current alcohol use. He reports that he does not use drugs. Medical:  has a past medical history of Actinic keratosis, Coronary artery disease, Dysplastic nevus (09/06/2014), Dysplastic nevus (07/09/2015), cardiac catheterization, Hyperlipidemia, and Hypertension. Surgical: Shawn Crawford  has a past surgical history that includes Coronary artery bypass graft; Hernia repair; Appendectomy; Cardiac catheterization; Colonoscopy with propofol (N/A, 06/24/2018); Esophagogastroduodenoscopy (egd) with propofol (N/A, 06/24/2018); Exam under anesthesia with hemorrhoidectomy (N/A, 11/11/2018); Insertion of mesh (02/05/2022); and Finger surgery (Left). Family: family history includes Colon cancer in his sister; Diabetes in his father; Healthy in his son and son; Hyperlipidemia in his mother; Hypertension in his father; Thyroid cancer in his sister.  Laboratory Chemistry Profile   Renal Lab Results  Component Value Date   BUN 13 06/23/2022   CREATININE 1.25 06/23/2022   BCR 10 06/23/2022   GFRAA 96 04/18/2020   GFRNONAA >60 01/29/2022    Hepatic Lab Results  Component Value Date   AST 23 01/28/2022   ALT 19 01/28/2022   ALBUMIN 4.4 06/23/2022   ALKPHOS 84 01/28/2022   LIPASE 23 06/14/2021     Electrolytes Lab Results  Component Value Date   NA 138 06/23/2022   K 4.8 06/23/2022   CL 97 06/23/2022   CALCIUM 9.2 06/23/2022   MG 2.0 01/28/2022   PHOS 2.7 (L) 06/23/2022    Bone Lab Results  Component Value Date   TESTOFREE 5.1 (L) 11/09/2019   TESTOSTERONE 263 (L) 08/16/2021    Inflammation (CRP: Acute Phase) (ESR: Chronic Phase) Lab Results  Component Value Date   CRP 6.0 08/19/2022   ESRSEDRATE 6 08/19/2022         Note: Above Lab results reviewed.  Recent Imaging Review   Narrative & Impression  CLINICAL DATA:  evaluate for stenosis prior to spinal cord stimulator   EXAM:  MRI THORACIC SPINE WITHOUT CONTRAST   TECHNIQUE: Multiplanar, multisequence MR imaging of the thoracic spine was performed. No intravenous contrast was administered.   COMPARISON:  None Available.   FINDINGS: Alignment: Mildly exaggerated kyphosis. No substantial sagittal subluxation.   Vertebrae: Vertebral body heights are maintained. No focal marrow edema to suggest acute fracture or discitis/osteomyelitis. No suspicious bone lesions.   Cord:  Normal cord signal.   Paraspinal and other soft tissues: Unremarkable.   Disc levels:   Mild multilevel degenerative change without significant canal or foraminal stenosis.   IMPRESSION: No significant stenosis.    LINICAL DATA:  Neurogenic claudication. Lumbar radiculopathy. Worsening chronic low back pain radiating down both legs.   EXAM: MRI LUMBAR SPINE WITHOUT CONTRAST   TECHNIQUE: Multiplanar, multisequence MR imaging of the lumbar spine was performed. No intravenous contrast was administered.   COMPARISON:  Lumbar spine MRI 03/03/2014   FINDINGS: Segmentation:  Standard.   Alignment: Mild straightening of the normal lumbar lordosis. No listhesis.   Vertebrae: No fracture, suspicious marrow lesion, or significant marrow edema.   Conus medullaris and cauda equina: Conus extends to the L1 level. Conus and  cauda equina appear normal.   Paraspinal and other soft tissues: Small left renal cysts measuring up to 1.3 cm.   Disc levels:   T12-L1 and L1-2: Negative.   L2-3: Trace disc bulging and mild facet hypertrophy without stenosis, unchanged.   L3-4: New disc desiccation and mild disc space narrowing. Disc bulging and mild facet and ligamentum flavum hypertrophy result in mild spinal stenosis, mild left greater than right lateral recess stenosis, and mild left neural foraminal stenosis, mildly progressed from prior.   L4-5: Disc desiccation and mild disc space narrowing. Disc bulging and mild facet hypertrophy result in mild right lateral recess stenosis and mild left neural foraminal stenosis, stable to slightly progressed. New left foraminal annular fissure.   L5-S1: Negative.   IMPRESSION: 1. Mildly progressive disc degeneration at L3-4 with mild spinal, lateral recess, and left neural foraminal stenosis. 2. Mild lateral recess and neural foraminal stenosis at L4-5, stable to slightly progressed.      Note: Reviewed        Physical Exam  General appearance: Well nourished, well developed, and well hydrated. In no apparent acute distress Mental status: Alert, oriented x 3 (person, place, & time)       Respiratory: No evidence of acute respiratory distress Eyes: PERLA Vitals: BP 128/81   Pulse 89   Temp (!) 97.3 F (36.3 C)   Resp 16   Ht 5\' 11"  (1.803 m)   Wt 156 lb (70.8 kg)   SpO2 100%   BMI 21.76 kg/m  BMI: Estimated body mass index is 21.76 kg/m as calculated from the following:   Height as of this encounter: 5\' 11"  (1.803 m).   Weight as of this encounter: 156 lb (70.8 kg). Ideal: Ideal body weight: 75.3 kg (166 lb 0.1 oz)  Lumbar Spine Area Exam  Skin & Axial Inspection: No masses, redness, or swelling Alignment: Symmetrical Functional ROM: Unrestricted ROM       Stability: No instability detected Muscle Tone/Strength: Functionally intact. No obvious  neuro-muscular anomalies detected. Sensory (Neurological): Dermatomal pain pattern and neurogenic   Gait & Posture Assessment  Ambulation: Unassisted Gait: Relatively normal for age and body habitus Posture: WNL  Lower Extremity Exam      Side: Right lower extremity   Side: Left lower extremity  Stability: No instability observed  Stability: No instability observed          Skin & Extremity Inspection: Skin color, temperature, and hair growth are WNL. No peripheral edema or cyanosis. No masses, redness, swelling, asymmetry, or associated skin lesions. No contractures.   Skin & Extremity Inspection: Skin color, temperature, and hair growth are WNL. No peripheral edema or cyanosis. No masses, redness, swelling, asymmetry, or associated skin lesions. No contractures.  Functional ROM: Unrestricted ROM                   Functional ROM: Unrestricted ROM                  Muscle Tone/Strength: Functionally intact. No obvious neuro-muscular anomalies detected.   Muscle Tone/Strength: Functionally intact. No obvious neuro-muscular anomalies detected.  Sensory (Neurological): Neurogenic pain pattern         Sensory (Neurological): Neurogenic pain pattern        DTR: Patellar: deferred today Achilles: deferred today Plantar: deferred today   DTR: Patellar: deferred today Achilles: deferred today Plantar: deferred today  Palpation: No palpable anomalies   Palpation: No palpable anomalies    Assessment   Diagnosis Status  1. Lumbar radiculopathy   2. Chronic radicular lumbar pain   3. Neuroforaminal stenosis of lumbar spine   4. Spinal stenosis, lumbar region, with neurogenic claudication   5. Chronic pain syndrome    Having a Flare-up Having a Flare-up Deteriorating     Plan of Care  I discussed  percutaneous spinal cord stimulator trial with the patient in detail. I explained to the patient that they will have an external power source and programmer which the patient will use  for 7 days. There will be daily communication with the stimulator company and the patient. A possible need for a mid-trial clinic visit to give the patient the best chance of success.   Patient has completed psychosocial behavioral evaluation.  We will make sure we have this on file  Some of patient's pain does seem to be mechanical in nature, with some component of neurogenic pain as well. We discussed the indications for spinal cord stimulation, specifically stating that it is typically better for neuropathic and appendicular pain, but that we have had some success in the treatment of low back and hip pain.   Patient is interested in proceeding with spinal cord stimulation trial. He understands that this may not be successful, and that spinal cord stimulation in general is not a "magic bullet."   We had a lengthy and very detailed discussion of all the risks, benefits, alternatives, and rationale of surgery as well as the option of continuing nonsurgical therapies. We specifically discussed the risks of temporary or permanent worsened neurologic injury, no symptomatic relief or pain made worse after procedure, and also the need for future surgery (due to infection, CSF leak, bleeding, adjacent segment issues, bone-healing difficulties, and other related issues). No guarantees of outcome were made or implied and he is eager to proceed and presents for definitive treatment.  Shawn Crawford told me that all of his questions were answered thoroughly and to his satisfaction. Confidence and understanding of the discussed risks and consequences of  treatment was expressed and he accepted these risks and was eager to proceed with procedure.   Issues concerning treatment and diagnosis were discussed with the patient. There are no barriers to understanding the plan of treatment. Explanation was well received by patient and/or family who then verbalized understanding.   I will also refill  his medications as below.  No  change in dose.  Consider repeating lumbar ESI in the fall.  Pharmacotherapy (Medications Ordered): Meds ordered this encounter  Medications   traMADol (ULTRAM) 50 MG tablet    Sig: Take 1 tablet (50 mg total) by mouth every 8 (eight) hours as needed for severe pain. Month last 30 days.    Dispense:  90 tablet    Refill:  2    [place] STOP ACT - Not applicable. Fill one day early if pharmacy is closed on scheduled refill date. .   Buprenorphine HCl (BELBUCA) 600 MCG FILM    Sig: Place 1 Film inside cheek every 12 (twelve) hours.    Dispense:  60 each    Refill:  2   Orders:  Orders Placed This Encounter  Procedures   [place] TRIAL    Contact medical implant company representative to notify them of the scheduled case and to make sure they will be available to provide required equipment.    Standing Status:   Future    Standing Expiration Date:   09/01/2023    Scheduling Instructions:     Side: Bilateral     Level: Lumbar     Device: Medtronic     Sedation: With sedation     Timeframe: As soon as pre-approved    Order Specific Question:   Where will this procedure be performed?    Answer:   [facility]   Follow-up plan:   Return in about 22 days (around 06/24/2023) for Medtronic SCS trial, ECT.      Recent Visits Date Type Provider Dept  03/30/23 Procedure visit Edward Jolly, MD [facility]  03/05/23 Office Visit Edward Jolly, MD [facility]  Showing recent visits within past 90 days and meeting all other requirements Today's Visits Date Type Provider Dept  06/02/23 Office Visit Edward Jolly, MD [facility]  Showing today's visits and meeting all other requirements Future Appointments No visits were found meeting these conditions. Showing future appointments within next 90 days and meeting all other requirements  I discussed the assessment and treatment plan with the patient. The patient was provided an opportunity to ask questions and  all were answered. The patient agreed with the plan and demonstrated an understanding of the instructions.  Patient advised to call back or seek an in-person evaluation if the symptoms or condition worsens.  Duration of encounter: .  Total time on encounter, as per AMA guidelines included both the face-to-face and non-face-to-face time personally spent by the physician and/or other qualified health care professional(s) on the day of the encounter (includes time in activities that require the physician or other qualified health care professional and does not include time in activities normally performed by clinical staff). Physician's time may include the following activities when performed: Preparing to see the patient (e.g., pre-charting review of records, searching for previously ordered imaging, lab work, and nerve conduction tests) Review of prior analgesic pharmacotherapies. Reviewing PMP Interpreting ordered tests (e.g., lab work, imaging, nerve conduction tests) Performing post-procedure evaluations, including interpretation of diagnostic procedures Obtaining and/or reviewing separately obtained history Performing a medically appropriate examination and/or evaluation Counseling and educating the patient/family/caregiver Ordering medications, tests, or procedures Referring and communicating with other health care professionals (when not separately reported) Documenting clinical information in the electronic or other health record Independently interpreting results (not separately reported) and communicating results to the patient/ family/caregiver Care coordination (not separately reported)  Note by: Edward Jolly, MD  Date: 06/02/2023; Time: 9:40 AM

## 2023-06-02 NOTE — Patient Instructions (Signed)
 ______________________________________________________________________    Preparing for your procedure  Appointments: If you think you may not be able to keep your appointment, call 24-48 hours in advance to cancel. We need time to make it available to others.  During your procedure appointment there will be: No Prescription Refills. No disability issues to discussed. No medication changes or discussions.  Instructions: Food intake: Avoid eating anything solid for at least 8 hours prior to your procedure. Clear liquid intake: You may take clear liquids such as water up to 2 hours prior to your procedure. (No carbonated drinks. No soda.) Transportation: Unless otherwise stated by your physician, bring a driver. (Driver cannot be a Market researcher, Pharmacist, community, or any other form of public transportation.) Morning Medicines: Except for blood thinners, take all of your other morning medications with a sip of water. Make sure to take your heart and blood pressure medicines. If your blood pressure's lower number is above 100, the case will be rescheduled. Blood thinners: Make sure to stop your blood thinners as instructed.  If you take a blood thinner, but were not instructed to stop it, call our office [phone] and ask to talk to a nurse. Not stopping a blood thinner prior to certain procedures could lead to serious complications. Diabetics on insulin: Notify the staff so that you can be scheduled 1st case in the morning. If your diabetes requires high dose insulin, take only  of your normal insulin dose the morning of the procedure and notify the staff that you have done so. Preventing infections: Shower with an antibacterial soap the morning of your procedure.  Build-up your immune system: Take 1000 mg of Vitamin C with every meal (3 times a day) the day prior to your procedure. Antibiotics: Inform the nursing staff if you are taking any antibiotics or if you have any conditions that may require antibiotics  prior to procedures. (Example: recent joint implants)   Pregnancy: If you are pregnant make sure to notify the nursing staff. Not doing so may result in injury to the fetus, including death.  Sickness: If you have a cold, fever, or any active infections, call and cancel or reschedule your procedure. Receiving steroids while having an infection may result in complications. Arrival: You must be in the facility at least 30 minutes prior to your scheduled procedure. Tardiness: Your scheduled time is also the cutoff time. If you do not arrive at least 15 minutes prior to your procedure, you will be rescheduled.  Children: Do not bring any children with you. Make arrangements to keep them home. Dress appropriately: There is always a possibility that your clothing may get soiled. Avoid long dresses. Valuables: Do not bring any jewelry or valuables.  Reasons to call and reschedule or cancel your procedure: (Following these recommendations will minimize the risk of a serious complication.) Surgeries: Avoid having procedures within 2 weeks of any surgery. (Avoid for 2 weeks before or after any surgery). Flu Shots: Avoid having procedures within 2 weeks of a flu shots or . (Avoid for 2 weeks before or after immunizations). Barium: Avoid having a procedure within 7-10 days after having had a radiological study involving the use of radiological contrast. (Myelograms, Barium swallow or enema study). Heart attacks: Avoid any elective procedures or surgeries for the initial 6 months after a "Myocardial Infarction" (Heart Attack). Blood thinners: It is imperative that you stop these medications before procedures. Let us know if you if you take any blood thinner.  Infection: Avoid procedures during or within  two weeks of an infection (including chest colds or gastrointestinal problems). Symptoms associated with infections include: Localized redness, fever, chills, night sweats or profuse sweating, burning sensation  when voiding, cough, congestion, stuffiness, runny nose, sore throat, diarrhea, nausea, vomiting, cold or Flu symptoms, recent or current infections. It is specially important if the infection is over the area that we intend to treat. Heart and lung problems: Symptoms that may suggest an active cardiopulmonary problem include: cough, chest pain, breathing difficulties or shortness of breath, dizziness, ankle swelling, uncontrolled high or unusually low blood pressure, and/or palpitations. If you are experiencing any of these symptoms, cancel your procedure and contact your primary care physician for an evaluation.  Remember:  Regular Business hours are:  Monday to Thursday 8:00 AM to 4:00 PM  Provider's Schedule: Delano Metz, MD:  Procedure days: Tuesday and Thursday 7:30 AM to 4:00 PM  Edward Jolly, MD:  Procedure days: Monday and Wednesday 7:30 AM to 4:00 PM Last  Updated: 05/19/2023 ______________________________________________________________________

## 2023-06-04 ENCOUNTER — Telehealth: Payer: Self-pay

## 2023-06-04 NOTE — Telephone Encounter (Signed)
His insurance has deemed the SCS trial not medically necessary. Here is what they said  The clinical reason for our determination is: Your doctor plans to use a pain device (spinal cord stimulation or SCS). The device has two parts- wire (electrode) and battery. We read your doctor's notes. We read your health plan criteria on pain devices. You have long term pain in the back and leg areas. We read your benefit papers on what is not covered. It includes medically inefficacious treatments. The request is for a unique type of treatment. You have what your doctors call lumbar radiculopathy. Medical efficacy is not established for treating the pain from these conditions. Medical efficacy is established only for the following conditions: 1. failed back surgery syndrome (low back not neck or mid back) and 2. Complex regional pain syndrome (CRPS). 3. Painful diabetic neuropathy. We have completed the review. We are unable to say your pain is from CRPS or the other conditions. For your safety, we also need a copy of a blood test showing you have enough clotting cells (platelet count found in a complete blood count or CBC). This was not sent. This service is not a covered benefit under your health plan because the procedure is not medically necessary. Please speak with your provider about options for treatment.

## 2023-09-01 ENCOUNTER — Encounter: Payer: Self-pay | Admitting: Student in an Organized Health Care Education/Training Program

## 2023-09-01 ENCOUNTER — Ambulatory Visit
Payer: 59 | Attending: Student in an Organized Health Care Education/Training Program | Admitting: Student in an Organized Health Care Education/Training Program

## 2023-09-01 VITALS — BP 141/92 | HR 101 | Temp 97.9°F | Ht 71.0 in | Wt 158.0 lb

## 2023-09-01 DIAGNOSIS — M5416 Radiculopathy, lumbar region: Secondary | ICD-10-CM | POA: Diagnosis present

## 2023-09-01 DIAGNOSIS — G8929 Other chronic pain: Secondary | ICD-10-CM

## 2023-09-01 DIAGNOSIS — M48062 Spinal stenosis, lumbar region with neurogenic claudication: Secondary | ICD-10-CM

## 2023-09-01 DIAGNOSIS — G894 Chronic pain syndrome: Secondary | ICD-10-CM | POA: Diagnosis not present

## 2023-09-01 DIAGNOSIS — M48061 Spinal stenosis, lumbar region without neurogenic claudication: Secondary | ICD-10-CM

## 2023-09-01 MED ORDER — HYDROCODONE-ACETAMINOPHEN 5-325 MG PO TABS: 1.0000 | ORAL_TABLET | Freq: Three times a day (TID) | ORAL | 0 refills | Status: DC | PRN

## 2023-09-01 MED ORDER — BELBUCA 750 MCG BU FILM: 1.0000 | ORAL_FILM | Freq: Two times a day (BID) | BUCCAL | 2 refills | Status: DC

## 2023-09-01 NOTE — Patient Instructions (Signed)
  ______________________________________________________________________    Procedure instructions  Stop blood-thinners  Do not eat or drink fluids (other than water) for 6 hours before your procedure  No water for 2 hours before your procedure  Take your blood pressure medicine with a sip of water  Arrive 30 minutes before your appointment  If sedation is planned, bring suitable driver. Pennie Banter, Benedetto Goad, & public transportation are NOT APPROVED)  Carefully read the "Preparing for your procedure" detailed instructions  If you have questions call us at [phone]  ______________________________________________________________________

## 2023-09-01 NOTE — Progress Notes (Signed)
PROVIDER NOTE: Information contained herein reflects review and annotations entered in association with encounter. Interpretation of such information and data should be left to medically-trained personnel. Information provided to patient can be located elsewhere in the medical record under "Patient Instructions". Document created using STT-dictation technology, any transcriptional errors that may result from process are unintentional.    Patient: Shawn Crawford  Service Category: E/M  Provider: Edward Jolly, MD  DOB: 1960-11-04  DOS: 09/01/2023  Referring Provider: Brett Albino*  MRN: [id number]  Specialty: Interventional Pain Management  PCP: Ronnald Ramp, MD  Type: Established Patient  Setting: Ambulatory outpatient    Location: Office  Delivery: Face-to-face     HPI  [name], a 62 y.o. year old male, is here today because of his Lumbar radiculopathy [M54.16]. Mr. Fetterhoff primary complain today is Back Pain (lower)  Pertinent problems: Mr. Biter has Arteriosclerosis of coronary artery; Chronic radicular lumbar pain; Benign essential HTN; Chronic pain syndrome; Chronic midline low back pain without sciatica; Lumbar facet arthropathy; Lumbar degenerative disc disease; and Piriformis muscle pain on their pertinent problem list. Pain Assessment: Severity of Chronic pain is reported as a 4 /10. Location: Back Lower/radiates bilaterally down to knees. Onset: More than a month ago. Quality: Aching. Timing: Constant. Modifying factor(s): meds. Vitals:  height is 5\' 11"  (1.803 m) and weight is 158 lb (71.7 kg). His temperature is 97.9 F (36.6 C). His blood pressure is 141/92 (abnormal) and his pulse is 101 (abnormal). His oxygen saturation is 100%.  BMI: Estimated body mass index is 22.04 kg/m as calculated from the following:   Height as of this encounter: 5\' 11"  (1.803 m).   Weight as of this encounter: 158 lb (71.7 kg). Last encounter: 06/02/2023. Last procedure:  03/30/2023.  Reason for encounter: medication management.  Discussed the use of AI scribe software for clinical note transcription with the patient, who gave verbal consent to proceed.  History of Present Illness   The patient, with a history of chronic pain, reports an increase in pain levels from a baseline of two to a current level of four, particularly in the afternoons and evenings. This increase in pain has been affecting his sleep and causing concern about a return to a previous state of higher pain levels. The patient denies any significant changes in work or stress levels that could contribute to this increase in pain.  He has been taking tramadol for breakthrough pain, but reports it provides minimal relief. Occasionally, he supplements with 600mg  of Advil, which seems to provide some relief. The patient also reports that his insurance declined a procedure for a stimulator, which has added to his concerns.  The patient has been taking Belbuca for his chronic pain and believes it is helping manage his pain levels. He reports that when he has been off of it due to stock issues, his pain becomes more intense.  The patient also reports a history of spinal stenosis and is currently managing his condition with a combination of medications and lifestyle modifications, including walking two miles daily. He expresses concern about the degenerative nature of his condition and the potential for increased pain over time.       Pharmacotherapy Assessment  Analgesic: Belbuca 600 mcg BID--> 750 mcg BID, Tramadol 50 --> Hydrocodone 5 mg q8 hrs breakthrough pain    Monitoring: [place] PMP: PDMP not reviewed this encounter.       Pharmacotherapy: No side-effects or adverse reactions reported. Compliance: No problems identified. Effectiveness: Clinically  acceptable.  Florina Ou, RN  09/01/2023  8:59 AM  Sign when Signing Visit  Safety precautions to be maintained throughout the outpatient stay will  include: orient to surroundings, keep bed in low position, maintain call bell within reach at all times, provide assistance with transfer out of bed and ambulation.   Nursing Pain Medication Assessment:  Safety precautions to be maintained throughout the outpatient stay will include: orient to surroundings, keep bed in low position, maintain call bell within reach at all times, provide assistance with transfer out of bed and ambulation.  Medication Inspection Compliance: Pill count conducted under aseptic conditions, in front of the patient. Neither the pills nor the bottle was removed from the patient's sight at any time. Once count was completed pills were immediately returned to the patient in their original bottle.  Medication #1: Tramadol (Ultram) Pill/Patch Count:  21 of 90 pills remain Pill/Patch Appearance: Markings consistent with prescribed medication Bottle Appearance: Standard pharmacy container. Clearly labeled. Filled Date: 53 / 5 / 2024 Last Medication i5ntake:  Today  Medication #2: Buprenorphine (Suboxone) Pill/Patch Count:  18 of 60 pills remain Pill/Patch Appearance: Markings consistent with prescribed medication Bottle Appearance: Standard pharmacy container. Clearly labeled. Filled Date: 73 / 3 / 2024 Last Medication intake:  Today  No results found for: "CBDTHCR" No results found for: "D8THCCBX" No results found for: "D9THCCBX"  UDS:  Summary  Date Value Ref Range Status  08/26/2022 Note  Final    Comment:    ==================================================================== [employer] Select 13 (MW) ==================================================================== Test                             Result       Flag       Units  Drug Present and Declared for Prescription Verification   Buprenorphine                  20           EXPECTED   ng/mg creat   Norbuprenorphine               118          EXPECTED   ng/mg creat    Source of buprenorphine is a  scheduled prescription medication.    Norbuprenorphine is an expected metabolite of buprenorphine.    Tramadol                       >2538        EXPECTED   ng/mg creat   O-Desmethyltramadol            >2538        EXPECTED   ng/mg creat   N-Desmethyltramadol            973          EXPECTED   ng/mg creat    Source of tramadol is a prescription medication. O-desmethyltramadol    and N-desmethyltramadol are expected metabolites of tramadol.  ==================================================================== Test                      Result    Flag   Units      Ref Range   Creatinine              197              mg/dL      >=16 ==================================================================== Declared  Medications:  The flagging and interpretation on this report are based on the  following declared medications.  Unexpected results may arise from  inaccuracies in the declared medications.   **Note: The testing scope of this panel includes these medications:   Tramadol (Ultram)   **Note: The testing scope of this panel does not include small to  moderate amounts of these reported medications:   Buprenorphine (Belbuca)   **Note: The testing scope of this panel does not include the  following reported medications:   Amlodipine (Norvasc)  Aspirin  Desonide (Desowen)  Multivitamin  Rosuvastatin (Crestor)  Tadalafil (Cialis)  Testosterone  Vitamin B12  Zolpidem (Ambien) ==================================================================== For clinical consultation, please call [phone]. ====================================================================       ROS  Constitutional: Denies any fever or chills Gastrointestinal: No reported hemesis, hematochezia, vomiting, or acute GI distress Musculoskeletal:  Low back and radiating bilateral hip and leg pain Neurological: No reported episodes of acute onset apraxia, aphasia, dysarthria, agnosia, amnesia, paralysis,  loss of coordination, or loss of consciousness  Medication Review  Buprenorphine HCl, HYDROcodone-acetaminophen, Multiple Vitamin, NEEDLE (DISP) 25 G, SYRINGE-NEEDLE (DISP) 3 ML, amLODipine, aspirin EC, cyanocobalamin, eszopiclone, rosuvastatin, tadalafil, testosterone cypionate, and zolpidem  History Review  Allergy: Mr. Abraha is allergic to penicillins. Drug: Mr. Gerhart  reports no history of drug use. Alcohol:  reports current alcohol use. Tobacco:  reports that he has never smoked. He has never been exposed to tobacco smoke. He has never used smokeless tobacco. Social: Mr. Clare  reports that he has never smoked. He has never been exposed to tobacco smoke. He has never used smokeless tobacco. He reports current alcohol use. He reports that he does not use drugs. Medical:  has a past medical history of Actinic keratosis, Coronary artery disease, Dysplastic nevus (09/06/2014), Dysplastic nevus (07/09/2015), cardiac catheterization, Hyperlipidemia, and Hypertension. Surgical: Mr. Bonavita  has a past surgical history that includes Coronary artery bypass graft; Hernia repair; Appendectomy; Cardiac catheterization; Colonoscopy with propofol (N/A, 06/24/2018); Esophagogastroduodenoscopy (egd) with propofol (N/A, 06/24/2018); Exam under anesthesia with hemorrhoidectomy (N/A, 11/11/2018); Insertion of mesh (02/05/2022); and Finger surgery (Left). Family: family history includes Colon cancer in his sister; Diabetes in his father; Healthy in his son and son; Hyperlipidemia in his mother; Hypertension in his father; Thyroid cancer in his sister.  Laboratory Chemistry Profile   Renal Lab Results  Component Value Date   BUN 13 06/23/2022   CREATININE 1.25 06/23/2022   BCR 10 06/23/2022   GFRAA 96 04/18/2020   GFRNONAA >60 01/29/2022    Hepatic Lab Results  Component Value Date   AST 23 01/28/2022   ALT 19 01/28/2022   ALBUMIN 4.4 06/23/2022   ALKPHOS 84 01/28/2022   LIPASE 23 06/14/2021     Electrolytes Lab Results  Component Value Date   NA 138 06/23/2022   K 4.8 06/23/2022   CL 97 06/23/2022   CALCIUM 9.2 06/23/2022   MG 2.0 01/28/2022   PHOS 2.7 (L) 06/23/2022    Bone Lab Results  Component Value Date   TESTOFREE 5.1 (L) 11/09/2019   TESTOSTERONE 263 (L) 08/16/2021    Inflammation (CRP: Acute Phase) (ESR: Chronic Phase) Lab Results  Component Value Date   CRP 6.0 08/19/2022   ESRSEDRATE 6 08/19/2022         Note: Above Lab results reviewed.  Physical Exam  General appearance: Well nourished, well developed, and well hydrated. In no apparent acute distress Mental status: Alert, oriented x 3 (person, place, & time)  Respiratory: No evidence of acute respiratory distress Eyes: PERLA Vitals: BP (!) 141/92   Pulse (!) 101   Temp 97.9 F (36.6 C)   Ht 5\' 11"  (1.803 m)   Wt 158 lb (71.7 kg)   SpO2 100%   BMI 22.04 kg/m  BMI: Estimated body mass index is 22.04 kg/m as calculated from the following:   Height as of this encounter: 5\' 11"  (1.803 m).   Weight as of this encounter: 158 lb (71.7 kg). Ideal: Ideal body weight: 75.3 kg (166 lb 0.1 oz)  Low back and radiating bilateral hip and leg pain  Assessment   Diagnosis Status  1. Lumbar radiculopathy   2. Chronic radicular lumbar pain   3. Neuroforaminal stenosis of lumbar spine   4. Spinal stenosis, lumbar region, with neurogenic claudication   5. Chronic pain syndrome    Having a Flare-up Having a Flare-up Controlled     Plan of Care  Problem-specific:  Assessment and Plan    Chronic Pain due to Spinal Stenosis and associated lumbar radicular pain.  Previous lumbar epidural steroid injection was done 03/30/2023 that provided him with 75% pain relief for approximately 5 to 6 months.  Given return of pain.  We discussed repeating lumbar ESI. He experiences chronic pain secondary to spinal stenosis, with a recent increase in pain severity from 2 to 4, particularly in the afternoons and  evenings. He reports inadequate pain relief from tramadol and occasional use of ibuprofen for breakthrough pain. His insurance declined the spinal cord stimulator procedure. He is currently on Belbuca, which helps manage the pain, but he experienced increased pain when off the medication. We discussed the degenerative nature of spinal stenosis and the potential for increased pain over time. We proposed an opioid rotation to hydrocodone due to potential tramadol tolerance and explained the risks of chronic NSAID use with baby aspirin, including kidney issues and bleeding risk. We discussed the benefits of acetaminophen for additional pain relief and the occasional use of ibuprofen for severe flare-ups. We will increase Belbuca to 750 mcg, prescribe hydrocodone 5 mg/325 mg acetaminophen, advise the use of acetaminophen for additional pain relief, and allow occasional use of ibuprofen for severe flare-ups, with caution regarding kidney issues and bleeding risk. We scheduled an injection for September 21, 2023, and a follow-up in three months for medication recheck and review.  Pharmacotherapy (Medications Ordered): Meds ordered this encounter  Medications   Buprenorphine HCl (BELBUCA) 750 MCG FILM    Sig: Place 1 Film inside cheek every 12 (twelve) hours.    Dispense:  60 each    Refill:  2   HYDROcodone-acetaminophen (NORCO/VICODIN) 5-325 MG tablet    Sig: Take 1 tablet by mouth every 8 (eight) hours as needed for severe pain (pain score 7-10). Must last 30 days.    Dispense:  90 tablet    Refill:  0    Chronic Pain: STOP Act (Not applicable) Fill 1 day early if closed on refill date. Avoid benzodiazepines within 8 hours of opioids   HYDROcodone-acetaminophen (NORCO/VICODIN) 5-325 MG tablet    Sig: Take 1 tablet by mouth every 8 (eight) hours as needed for severe pain (pain score 7-10). Must last 30 days.    Dispense:  90 tablet    Refill:  0    Chronic Pain: STOP Act (Not applicable) Fill 1 day early  if closed on refill date. Avoid benzodiazepines within 8 hours of opioids   HYDROcodone-acetaminophen (NORCO/VICODIN) 5-325 MG tablet  Sig: Take 1 tablet by mouth every 8 (eight) hours as needed for severe pain (pain score 7-10). Must last 30 days.    Dispense:  90 tablet    Refill:  0    Chronic Pain: STOP Act (Not applicable) Fill 1 day early if closed on refill date. Avoid benzodiazepines within 8 hours of opioids   Orders:  Orders Placed This Encounter  Procedures   Lumbar Epidural Injection    Standing Status:   Future    Standing Expiration Date:   11/30/2023    Scheduling Instructions:     Procedure: Interlaminar Lumbar Epidural Steroid injection (LESI)            Laterality: Midline     Sedation: Patient's choice.     Timeframe: ASAA    Order Specific Question:   Where will this procedure be performed?    Answer:   [facility]   Follow-up plan:   Return in about 20 days (around 09/21/2023) for L4/5 ESI, in clinic [facility].      Recent Visits No visits were found meeting these conditions. Showing recent visits within past 90 days and meeting all other requirements Today's Visits Date Type Provider Dept  09/01/23 Office Visit Edward Jolly, MD [facility]  Showing today's visits and meeting all other requirements Future Appointments Date Type Provider Dept  09/21/23 Appointment Edward Jolly, MD [facility]  11/26/23 Appointment Edward Jolly, MD [facility]  Showing future appointments within next 90 days and meeting all other requirements  I discussed the assessment and treatment plan with the patient. The patient was provided an opportunity to ask questions and all were answered. The patient agreed with the plan and demonstrated an understanding of the instructions.  Patient advised to call back or seek an in-person evaluation if the symptoms or condition worsens.  Duration of encounter: .  Total time on encounter, as per  AMA guidelines included both the face-to-face and non-face-to-face time personally spent by the physician and/or other qualified health care professional(s) on the day of the encounter (includes time in activities that require the physician or other qualified health care professional and does not include time in activities normally performed by clinical staff). Physician's time may include the following activities when performed: Preparing to see the patient (e.g., pre-charting review of records, searching for previously ordered imaging, lab work, and nerve conduction tests) Review of prior analgesic pharmacotherapies. Reviewing PMP Interpreting ordered tests (e.g., lab work, imaging, nerve conduction tests) Performing post-procedure evaluations, including interpretation of diagnostic procedures Obtaining and/or reviewing separately obtained history Performing a medically appropriate examination and/or evaluation Counseling and educating the patient/family/caregiver Ordering medications, tests, or procedures Referring and communicating with other health care professionals (when not separately reported) Documenting clinical information in the electronic or other health record Independently interpreting results (not separately reported) and communicating results to the patient/ family/caregiver Care coordination (not separately reported)  Note by: Edward Jolly, MD Date: 09/01/2023; Time: 9:44 AM

## 2023-09-01 NOTE — Progress Notes (Signed)
Safety precautions to be maintained throughout the outpatient stay will include: orient to surroundings, keep bed in low position, maintain call bell within reach at all times, provide assistance with transfer out of bed and ambulation.   Nursing Pain Medication Assessment:  Safety precautions to be maintained throughout the outpatient stay will include: orient to surroundings, keep bed in low position, maintain call bell within reach at all times, provide assistance with transfer out of bed and ambulation.  Medication Inspection Compliance: Pill count conducted under aseptic conditions, in front of the patient. Neither the pills nor the bottle was removed from the patient's sight at any time. Once count was completed pills were immediately returned to the patient in their original bottle.  Medication #1: Tramadol (Ultram) Pill/Patch Count:  21 of 90 pills remain Pill/Patch Appearance: Markings consistent with prescribed medication Bottle Appearance: Standard pharmacy container. Clearly labeled. Filled Date: 34 / 5 / 2024 Last Medication i5ntake:  Today  Medication #2: Buprenorphine (Suboxone) Pill/Patch Count:  18 of 60 pills remain Pill/Patch Appearance: Markings consistent with prescribed medication Bottle Appearance: Standard pharmacy container. Clearly labeled. Filled Date: 68 / 3 / 2024 Last Medication intake:  Today

## 2023-09-09 ENCOUNTER — Encounter: Payer: Self-pay | Admitting: *Deleted

## 2023-09-09 ENCOUNTER — Encounter: Payer: Self-pay | Admitting: Student in an Organized Health Care Education/Training Program

## 2023-09-09 DIAGNOSIS — M5416 Radiculopathy, lumbar region: Secondary | ICD-10-CM

## 2023-09-11 ENCOUNTER — Other Ambulatory Visit: Payer: Self-pay | Admitting: Family Medicine

## 2023-09-11 DIAGNOSIS — R7989 Other specified abnormal findings of blood chemistry: Secondary | ICD-10-CM

## 2023-09-15 ENCOUNTER — Other Ambulatory Visit: Payer: Self-pay | Admitting: Student in an Organized Health Care Education/Training Program

## 2023-09-15 MED ORDER — TRAMADOL HCL 50 MG PO TABS: 50.0000 mg | ORAL_TABLET | Freq: Three times a day (TID) | ORAL | 2 refills | Status: DC | PRN

## 2023-09-21 ENCOUNTER — Ambulatory Visit
Admission: RE | Admit: 2023-09-21 | Discharge: 2023-09-21 | Disposition: A | Discharge status: Home / Self Care | Payer: 59 | Source: Ambulatory Visit | Attending: Student in an Organized Health Care Education/Training Program | Admitting: Student in an Organized Health Care Education/Training Program

## 2023-09-21 ENCOUNTER — Encounter: Payer: Self-pay | Admitting: Student in an Organized Health Care Education/Training Program

## 2023-09-21 ENCOUNTER — Ambulatory Visit
Payer: 59 | Attending: Student in an Organized Health Care Education/Training Program | Admitting: Student in an Organized Health Care Education/Training Program

## 2023-09-21 VITALS — BP 152/94 | HR 75 | Temp 97.9°F | Resp 16 | Ht 71.0 in | Wt 162.0 lb

## 2023-09-21 DIAGNOSIS — M48061 Spinal stenosis, lumbar region without neurogenic claudication: Secondary | ICD-10-CM | POA: Insufficient documentation

## 2023-09-21 DIAGNOSIS — M5416 Radiculopathy, lumbar region: Secondary | ICD-10-CM | POA: Insufficient documentation

## 2023-09-21 DIAGNOSIS — G8929 Other chronic pain: Secondary | ICD-10-CM | POA: Diagnosis not present

## 2023-09-21 MED ORDER — LIDOCAINE HCL 2 % IJ SOLN
20.0000 mL | Freq: Once | INTRAMUSCULAR | Status: AC
  Administered 2023-09-21: 200 mg

## 2023-09-21 MED ORDER — ROPIVACAINE HCL 2 MG/ML IJ SOLN
INTRAMUSCULAR | Status: AC
  Filled 2023-09-21: qty 20

## 2023-09-21 MED ORDER — ROPIVACAINE HCL 2 MG/ML IJ SOLN
2.0000 mL | Freq: Once | INTRAMUSCULAR | Status: AC
  Administered 2023-09-21: 2 mL via EPIDURAL

## 2023-09-21 MED ORDER — SODIUM CHLORIDE 0.9% FLUSH
2.0000 mL | Freq: Once | INTRAVENOUS | Status: AC
  Administered 2023-09-21: 2 mL

## 2023-09-21 MED ORDER — DEXAMETHASONE SODIUM PHOSPHATE 10 MG/ML IJ SOLN
INTRAMUSCULAR | Status: AC
  Filled 2023-09-21: qty 1

## 2023-09-21 MED ORDER — SODIUM CHLORIDE (PF) 0.9 % IJ SOLN
INTRAMUSCULAR | Status: AC
  Filled 2023-09-21: qty 10

## 2023-09-21 MED ORDER — DEXAMETHASONE SODIUM PHOSPHATE 10 MG/ML IJ SOLN
10.0000 mg | Freq: Once | INTRAMUSCULAR | Status: AC
  Administered 2023-09-21: 10 mg

## 2023-09-21 MED ORDER — IOHEXOL 180 MG/ML  SOLN
10.0000 mL | Freq: Once | INTRAMUSCULAR | Status: AC
  Administered 2023-09-21: 10 mL via EPIDURAL
  Filled 2023-09-21: qty 20

## 2023-09-21 MED ORDER — LIDOCAINE HCL (PF) 2 % IJ SOLN
INTRAMUSCULAR | Status: AC
  Filled 2023-09-21: qty 10

## 2023-09-21 NOTE — Patient Instructions (Signed)

## 2023-09-21 NOTE — Progress Notes (Signed)
Safety precautions to be maintained throughout the outpatient stay will include: orient to surroundings, keep bed in low position, maintain call bell within reach at all times, provide assistance with transfer out of bed and ambulation.  

## 2023-09-21 NOTE — Progress Notes (Signed)
PROVIDER NOTE: Interpretation of information contained herein should be left to medically-trained personnel. Specific patient instructions are provided elsewhere under "Patient Instructions" section of medical record. This document was created in part using STT-dictation technology, any transcriptional errors that may result from this process are unintentional.  Patient: Shawn Crawford Type: Established DOB: 03-26-1961 MRN: [id number] PCP: Bosie Clos, MD  Service: Procedure DOS: 09/21/2023 Setting: Ambulatory Location: Ambulatory outpatient facility Delivery: Face-to-face Provider: Edward Jolly, MD Specialty: Interventional Pain Management Specialty designation: 09 Location: Outpatient facility Ref. Prov.: Simmons-Robinson, Makie*    Primary Reason for Visit: Interventional Pain Management Treatment. CC: Back Pain (lower)   Procedure:           Type: Lumbar epidural steroid injection (LESI) (interlaminar) #3 (2024)    Laterality: Midline   Level:  L4-5 Level.  Imaging: Fluoroscopic guidance         Anesthesia: Local anesthesia (1-2% Lidocaine) DOS: 09/21/2023  Performed by: Edward Jolly, MD  Purpose: Diagnostic/Therapeutic Indications: Lumbar radicular pain of intraspinal etiology of more than 4 weeks that has failed to respond to conservative therapy and is severe enough to impact quality of life or function. 1. Lumbar radiculopathy   2. Chronic radicular lumbar pain   3. Neuroforaminal stenosis of lumbar spine    NAS-11 Pain score:   Pre-procedure: 3 /10   Post-procedure: 2 /10      Position / Prep / Materials:  Position: Prone w/ head of the table raised (slight reverse trendelenburg) to facilitate breathing.  Prep solution: DuraPrep (Iodine Povacrylex [0.7% available iodine] and Isopropyl Alcohol, 74% w/w) Prep Area: Entire Posterior Lumbar Region from lower scapular tip down to mid buttocks area and from flank to flank. Materials:  Tray: Epidural tray Needle(s):   Type: Epidural needle (Tuohy) Gauge (G):  22 Length: Regular (3.5-in) Qty: 1  Pre-op H&P Assessment:  Shawn Crawford is a 62 y.o. (year old), male patient, seen today for interventional treatment. He  has a past surgical history that includes Coronary artery bypass graft; Hernia repair; Appendectomy; Cardiac catheterization; Colonoscopy with propofol (N/A, 06/24/2018); Esophagogastroduodenoscopy (egd) with propofol (N/A, 06/24/2018); Exam under anesthesia with hemorrhoidectomy (N/A, 11/11/2018); Insertion of mesh (02/05/2022); and Finger surgery (Left). Shawn Crawford has a current medication list which includes the following prescription(s): amlodipine, aspirin ec, belbuca, multiple vitamin, b-d disp needle 25gx1", rosuvastatin, b-d 3cc luer-lok syr 21gx1-1/2, testosterone cypionate, tramadol, cyanocobalamin, zolpidem, eszopiclone, and tadalafil. His primarily concern today is the Back Pain (lower)  Initial Vital Signs:  Pulse/HCG Rate: 75ECG Heart Rate: 66 Temp:  97.9 F (36.6 C) Resp: 14 BP: (!) 153/92 SpO2: 99 %  BMI: Estimated body mass index is 22.59 kg/m as calculated from the following:   Height as of this encounter: 5\' 11"  (1.803 m).   Weight as of this encounter: 162 lb (73.5 kg).  Risk Assessment: Allergies: Reviewed. He is allergic to penicillins.  Allergy Precautions: None required Coagulopathies: Reviewed. None identified.  Blood-thinner therapy: None at this time Active Infection(s): Reviewed. None identified. Shawn Crawford is afebrile  Site Confirmation: Shawn Crawford was asked to confirm the procedure and laterality before marking the site Procedure checklist: Completed Consent: Before the procedure and under the influence of no sedative(s), amnesic(s), or anxiolytics, the patient was informed of the treatment options, risks and possible complications. To fulfill our ethical and legal obligations, as recommended by the American Medical Association's Code of Ethics, I have informed the  patient of my clinical impression; the nature and purpose of the treatment or  procedure; the risks, benefits, and possible complications of the intervention; the alternatives, including doing nothing; the risk(s) and benefit(s) of the alternative treatment(s) or procedure(s); and the risk(s) and benefit(s) of doing nothing. The patient was provided information about the general risks and possible complications associated with the procedure. These may include, but are not limited to: failure to achieve desired goals, infection, bleeding, organ or nerve damage, allergic reactions, paralysis, and death. In addition, the patient was informed of those risks and complications associated to Spine-related procedures, such as failure to decrease pain; infection (i.e.: Meningitis, epidural or intraspinal abscess); bleeding (i.e.: epidural hematoma, subarachnoid hemorrhage, or any other type of intraspinal or peri-dural bleeding); organ or nerve damage (i.e.: Any type of peripheral nerve, nerve root, or spinal cord injury) with subsequent damage to sensory, motor, and/or autonomic systems, resulting in permanent pain, numbness, and/or weakness of one or several areas of the body; allergic reactions; (i.e.: anaphylactic reaction); and/or death. Furthermore, the patient was informed of those risks and complications associated with the medications. These include, but are not limited to: allergic reactions (i.e.: anaphylactic or anaphylactoid reaction(s)); adrenal axis suppression; blood sugar elevation that in diabetics may result in ketoacidosis or comma; water retention that in patients with history of congestive heart failure may result in shortness of breath, pulmonary edema, and decompensation with resultant heart failure; weight gain; swelling or edema; medication-induced neural toxicity; particulate matter embolism and blood vessel occlusion with resultant organ, and/or nervous system infarction; and/or aseptic necrosis  of one or more joints. Finally, the patient was informed that Medicine is not an exact science; therefore, there is also the possibility of unforeseen or unpredictable risks and/or possible complications that may result in a catastrophic outcome. The patient indicated having understood very clearly. We have given the patient no guarantees and we have made no promises. Enough time was given to the patient to ask questions, all of which were answered to the patient's satisfaction. Mr. Popko has indicated that he wanted to continue with the procedure. Attestation: I, the ordering provider, attest that I have discussed with the patient the benefits, risks, side-effects, alternatives, likelihood of achieving goals, and potential problems during recovery for the procedure that I have provided informed consent. Date  Time: 09/21/2023 10:19 AM  Pre-Procedure Preparation:  Monitoring: As per clinic protocol. Respiration, ETCO2, SpO2, BP, heart rate and rhythm monitor placed and checked for adequate function Safety Precautions: Patient was assessed for positional comfort and pressure points before starting the procedure. Time-out: I initiated and conducted the "Time-out" before starting the procedure, as per protocol. The patient was asked to participate by confirming the accuracy of the "Time Out" information. Verification of the correct person, site, and procedure were performed and confirmed by me, the nursing staff, and the patient. "Time-out" conducted as per Joint Commission's Universal Protocol (UP.01.01.01). Time: 1046  Description/Narrative of Procedure:          Target: Epidural space via interlaminar opening, initially targeting the lower laminar border of the superior vertebral body. Region: Lumbar Approach: Percutaneous paravertebral  Rationale (medical necessity): procedure needed and proper for the diagnosis and/or treatment of the patient's medical symptoms and needs. Procedural Technique  Safety Precautions: Aspiration looking for blood return was conducted prior to all injections. At no point did we inject any substances, as a needle was being advanced. No attempts were made at seeking any paresthesias. Safe injection practices and needle disposal techniques used. Medications properly checked for expiration dates. SDV (single dose vial) medications used.  Description of the Procedure: Protocol guidelines were followed. The procedure needle was introduced through the skin, ipsilateral to the reported pain, and advanced to the target area. Bone was contacted and the needle walked caudad, until the lamina was cleared. The epidural space was identified using "loss-of-resistance technique" with 2-3 ml of PF-NaCl (0.9% NSS), in a 5cc LOR glass syringe.  6cc solution made of 3 cc of preservative-free saline, 2 cc of 0.2% ropivacaine, 1 cc of Decadron 10 mg/cc.   Vitals:   09/21/23 1039 09/21/23 1044 09/21/23 1049 09/21/23 1051  BP: (!) 147/81 (!) 148/96 (!) 157/85 (!) 152/94  Pulse:      Resp: 14 11 14 16   Temp:      SpO2: 99% 99% 96% 100%  Weight:      Height:        Start Time: 1046 hrs. End Time: 1051 hrs.  Imaging Guidance (Spinal):          Type of Imaging Technique: Fluoroscopy Guidance (Spinal) Indication(s): Assistance in needle guidance and placement for procedures requiring needle placement in or near specific anatomical locations not easily accessible without such assistance. Exposure Time: Please see nurses notes. Contrast: Before injecting any contrast, we confirmed that the patient did not have an allergy to iodine, shellfish, or radiological contrast. Once satisfactory needle placement was completed at the desired level, radiological contrast was injected. Contrast injected under live fluoroscopy. No contrast complications. See chart for type and volume of contrast used. Fluoroscopic Guidance: I was personally present during the use of fluoroscopy. "Tunnel Vision  Technique" used to obtain the best possible view of the target area. Parallax error corrected before commencing the procedure. "Direction-depth-direction" technique used to introduce the needle under continuous pulsed fluoroscopy. Once target was reached, antero-posterior, oblique, and lateral fluoroscopic projection used confirm needle placement in all planes. Images permanently stored in EMR. Interpretation: I personally interpreted the imaging intraoperatively. Adequate needle placement confirmed in multiple planes. Appropriate spread of contrast into desired area was observed. No evidence of afferent or efferent intravascular uptake. No intrathecal or subarachnoid spread observed. Permanent images saved into the patient's record.  Antibiotic Prophylaxis:   Anti-infectives (From admission, onward)    None      Indication(s): None identified  Post-operative Assessment:  Post-procedure Vital Signs:  Pulse/HCG Rate: 7572 Temp:  97.9 F (36.6 C) Resp: 16 BP: (!) 152/94 SpO2: 100 %  EBL: None  Complications: No immediate post-treatment complications observed by team, or reported by patient.  Note: The patient tolerated the entire procedure well. A repeat set of vitals were taken after the procedure and the patient was kept under observation following institutional policy, for this type of procedure. Post-procedural neurological assessment was performed, showing return to baseline, prior to discharge. The patient was provided with post-procedure discharge instructions, including a section on how to identify potential problems. Should any problems arise concerning this procedure, the patient was given instructions to immediately contact us, at any time, without hesitation. In any case, we plan to contact the patient by telephone for a follow-up status report regarding this interventional procedure.  Comments:  No additional relevant information.  5 out of 5 strength bilateral lower  extremity: Plantar flexion, dorsiflexion, knee flexion, knee extension.   Plan of Care  Orders:  Orders Placed This Encounter  Procedures   DG PAIN CLIN[facility] C-ARM 1-60 MIN NO REPORT    Intraoperative interpretation by procedural physician at [facility].    Standing Status:   Standing  Number of Occurrences:   1    Reason for exam::   Assistance in needle guidance and placement for procedures requiring needle placement in or near specific anatomical locations not easily accessible without such assistance.   Chronic Opioid Analgesic:  Belbuca 600 mcg BID, Tramadol 50 mg q8 hrs breakthrough pain    Medications ordered for procedure: Meds ordered this encounter  Medications   iohexol (OMNIPAQUE) 180 MG/ML injection 10 mL    Must be Myelogram-compatible. If not available, you may substitute with a water-soluble, non-ionic, hypoallergenic, myelogram-compatible radiological contrast medium.   lidocaine (XYLOCAINE) 2 % (with pres) injection 400 mg   ropivacaine (PF) 2 mg/mL (0.2%) (NAROPIN) injection 2 mL   sodium chloride flush (NS) 0.9 % injection 2 mL   dexamethasone (DECADRON) injection 10 mg   Medications administered: We administered iohexol, lidocaine, ropivacaine (PF) 2 mg/mL (0.2%), sodium chloride flush, and dexamethasone.  See the medical record for exact dosing, route, and time of administration.  Follow-up plan:   Return for Keep sch. appt.      Recent Visits Date Type Provider Dept  09/01/23 Office Visit Edward Jolly, MD [facility]  Showing recent visits within past 90 days and meeting all other requirements Today's Visits Date Type Provider Dept  09/21/23 Procedure visit Edward Jolly, MD [facility]  Showing today's visits and meeting all other requirements Future Appointments Date Type Provider Dept  11/26/23 Appointment Edward Jolly, MD [facility]  Showing future appointments within next 90 days and meeting all other  requirements  Disposition: Discharge home  Discharge (Date  Time): 09/21/2023; 1100 hrs.   Primary Care Physician: Bosie Clos, MD Location: [facility] Note by: Edward Jolly, MD Date: 09/21/2023; Time: 11:05 AM  Disclaimer:  Medicine is not an exact science. The only guarantee in medicine is that nothing is guaranteed. It is important to note that the decision to proceed with this intervention was based on the information collected from the patient. The Data and conclusions were drawn from the patient's questionnaire, the interview, and the physical examination. Because the information was provided in large part by the patient, it cannot be guaranteed that it has not been purposely or unconsciously manipulated. Every effort has been made to obtain as much relevant data as possible for this evaluation. It is important to note that the conclusions that lead to this procedure are derived in large part from the available data. Always take into account that the treatment will also be dependent on availability of resources and existing treatment guidelines, considered by other Pain Management Practitioners as being common knowledge and practice, at the time of the intervention. For Medico-Legal purposes, it is also important to point out that variation in procedural techniques and pharmacological choices are the acceptable norm. The indications, contraindications, technique, and results of the above procedure should only be interpreted and judged by a Board-Certified Interventional Pain Specialist with extensive familiarity and expertise in the same exact procedure and technique.

## 2023-09-22 ENCOUNTER — Telehealth: Payer: Self-pay

## 2023-09-22 NOTE — Telephone Encounter (Signed)
Called PP. No answer, Left message to call if needed and if and emergency that he could always go to the ED.

## 2023-10-26 ENCOUNTER — Telehealth: Payer: Self-pay | Admitting: Family Medicine

## 2023-10-26 ENCOUNTER — Other Ambulatory Visit: Payer: Self-pay

## 2023-10-26 DIAGNOSIS — N529 Male erectile dysfunction, unspecified: Secondary | ICD-10-CM

## 2023-10-26 NOTE — Telephone Encounter (Signed)
Patient reported not taking in December

## 2023-10-26 NOTE — Telephone Encounter (Signed)
[facility] is requesting refill tadalafil (CIALIS) 5 MG tablet   Please advise

## 2023-10-27 NOTE — Telephone Encounter (Signed)
Looks like this pt is seeing Dr. Sullivan Lone

## 2023-11-05 ENCOUNTER — Ambulatory Visit: Payer: 59 | Admitting: Dermatology

## 2023-11-17 ENCOUNTER — Encounter: Payer: Self-pay | Admitting: Student in an Organized Health Care Education/Training Program

## 2023-11-17 ENCOUNTER — Ambulatory Visit
Payer: 59 | Attending: Student in an Organized Health Care Education/Training Program | Admitting: Student in an Organized Health Care Education/Training Program

## 2023-11-17 VITALS — Ht 71.0 in | Wt 161.0 lb

## 2023-11-17 DIAGNOSIS — M48061 Spinal stenosis, lumbar region without neurogenic claudication: Secondary | ICD-10-CM | POA: Diagnosis present

## 2023-11-17 DIAGNOSIS — M5416 Radiculopathy, lumbar region: Secondary | ICD-10-CM | POA: Diagnosis present

## 2023-11-17 DIAGNOSIS — G8929 Other chronic pain: Secondary | ICD-10-CM | POA: Diagnosis present

## 2023-11-17 DIAGNOSIS — M48062 Spinal stenosis, lumbar region with neurogenic claudication: Secondary | ICD-10-CM | POA: Diagnosis not present

## 2023-11-17 DIAGNOSIS — G894 Chronic pain syndrome: Secondary | ICD-10-CM | POA: Insufficient documentation

## 2023-11-17 MED ORDER — BELBUCA 750 MCG BU FILM: 1.0000 | ORAL_FILM | Freq: Two times a day (BID) | BUCCAL | 2 refills | Status: DC

## 2023-11-17 MED ORDER — TRAMADOL HCL 50 MG PO TABS: 50.0000 mg | ORAL_TABLET | Freq: Three times a day (TID) | ORAL | 2 refills | Status: DC | PRN

## 2023-11-17 NOTE — Progress Notes (Signed)
 Safety precautions to be maintained throughout the outpatient stay will include: orient to surroundings, keep bed in low position, maintain call bell within reach at all times, provide assistance with transfer out of bed and ambulation.

## 2023-11-17 NOTE — Progress Notes (Signed)
 PROVIDER NOTE: Information contained herein reflects review and annotations entered in association with encounter. Interpretation of such information and data should be left to medically-trained personnel. Information provided to patient can be located elsewhere in the medical record under "Patient Instructions". Document created using STT-dictation technology, any transcriptional errors that may result from process are unintentional.    Patient: Shawn Crawford  Service Category: E/M  Provider: Edward Jolly, MD  DOB: [birth year]  DOS: 11/17/2023  Referring Provider: Brett Albino*  MRN: [id number]  Specialty: Interventional Pain Management  PCP: Bosie Clos, MD  Type: Established Patient  Setting: Ambulatory outpatient    Location: Office  Delivery: Face-to-face     HPI  Shawn Crawford, a 63 y.o. year old male, is here today because of his Lumbar radiculopathy [M54.16]. Shawn Crawford primary complain today is Back Pain, Hip Pain, and Leg Pain  Pertinent problems: Shawn Crawford has Arteriosclerosis of coronary artery; Chronic radicular lumbar pain; Benign essential HTN; Chronic pain syndrome; Chronic midline low back pain without sciatica; Lumbar facet arthropathy; Lumbar degenerative disc disease; and Piriformis muscle pain on their pertinent problem list. Pain Assessment: Severity of Chronic pain is reported as a 4 /10. Location: Back Lower, Right, Left/Radiates from lower back into hips bilateral to hamstrings to back knee bilateral. Onset: More than a month ago. Quality: Aching. Timing: Intermittent. Modifying factor(s): Medication and sleeping in side with pillow in between th elegs. Vitals:  height is 5\' 11"  (1.803 m) and weight is 161 lb (73 kg).  BMI: Estimated body mass index is 22.45 kg/m as calculated from the following:   Height as of this encounter: 5\' 11"  (1.803 m).   Weight as of this encounter: 161 lb (73 kg). Last encounter: 06/02/2023. Last procedure: 03/30/2023.  Reason for  encounter: medication management.  Discussed the use of AI scribe software for clinical note transcription with the patient, who gave verbal consent to proceed.  History of Present Illness   Shawn Crawford "Shawn Crawford" is a 63 year old male who presents for medication refill and pain management consultation. He was referred by Dr. Noralee Stain for further evaluation of his chronic lumbar radicular pain.  He is managing chronic pain with tramadol, having switched back from hydrocodone due to intolerance. He is also on Belbuca. He takes tramadol as needed and has approximately half a bottle left. He also uses Belbuca, which he finds costly, and plans to refill both medications before an upcoming cruise on February 26th.  He has a history of chronic pain for which he has been receiving conservative treatment for several years. He has undergone an x-ray and is preparing for an MRI. A previous MRI from a year ago was reviewed, and there was a discussion about the potential need for surgical intervention, although he is hesitant about surgery.  His brother-in-law, an ENT doctor, mentioned a new non-opioid pain medication that recently came to market, which might be an alternative to his current regimen.       Pharmacotherapy Assessment  Analgesic: Belbuca 750 mcg BID, Tramadol 50 mg TID prn    Monitoring: [place] PMP: PDMP reviewed during this encounter.       Pharmacotherapy: No side-effects or adverse reactions reported. Compliance: No problems identified. Effectiveness: Clinically acceptable.  Earlyne Iba, RN  11/17/2023  2:58 PM  Sign when Signing Visit Safety precautions to be maintained throughout the outpatient stay will include: orient to surroundings, keep bed in low position, maintain call bell within reach at all  times, provide assistance with transfer out of bed and ambulation.   No results found for: "CBDTHCR" No results found for: "D8THCCBX" No results found for: "D9THCCBX"  UDS:  Summary   Date Value Ref Range Status  08/26/2022 Note  Final    Comment:    ==================================================================== [employer]ox[employer] Select 13 (MW) ==================================================================== Test                             Result       Flag       Units  Drug Present and Declared for Prescription Verification   Buprenorphine                  20           EXPECTED   ng/mg creat   Norbuprenorphine               118          EXPECTED   ng/mg creat    Source of buprenorphine is a scheduled prescription medication.    Norbuprenorphine is an expected metabolite of buprenorphine.    Tramadol                       >2538        EXPECTED   ng/mg creat   O-Desmethyltramadol            >2538        EXPECTED   ng/mg creat   N-Desmethyltramadol            973          EXPECTED   ng/mg creat    Source of tramadol is a prescription medication. O-desmethyltramadol    and N-desmethyltramadol are expected metabolites of tramadol.  ==================================================================== Test                      Result    Flag   Units      Ref Range   Creatinine              197              mg/dL      >=08 ==================================================================== Declared Medications:  The flagging and interpretation on this report are based on the  following declared medications.  Unexpected results may arise from  inaccuracies in the declared medications.   **Note: The testing scope of this panel includes these medications:   Tramadol (Ultram)   **Note: The testing scope of this panel does not include small to  moderate amounts of these reported medications:   Buprenorphine (Belbuca)   **Note: The testing scope of this panel does not include the  following reported medications:   Amlodipine (Norvasc)  Aspirin  Desonide (Desowen)  Multivitamin  Rosuvastatin (Crestor)  Tadalafil (Cialis)  Testosterone  Vitamin B12   Zolpidem (Ambien) ==================================================================== For clinical consultation, please call [phone]. ====================================================================       ROS  Constitutional: Denies any fever or chills Gastrointestinal: No reported hemesis, hematochezia, vomiting, or acute GI distress Musculoskeletal:  Low back and radiating bilateral hip and leg pain Neurological: No reported episodes of acute onset apraxia, aphasia, dysarthria, agnosia, amnesia, paralysis, loss of coordination, or loss of consciousness  Medication Review  Buprenorphine HCl, Multiple Vitamin, NEEDLE (DISP) 25 G, SYRINGE-NEEDLE (DISP) 3 ML, amLODipine, aspirin EC, cyanocobalamin, eszopiclone, rosuvastatin, tadalafil, testosterone cypionate, traMADol, and zolpidem  History Review  Allergy: Mr. Twist is allergic to penicillins. Drug: Mr. Pry  reports no history of drug use. Alcohol:  reports current alcohol use. Tobacco:  reports that he has never smoked. He has never been exposed to tobacco smoke. He has never used smokeless tobacco. Social: Mr. Hadden  reports that he has never smoked. He has never been exposed to tobacco smoke. He has never used smokeless tobacco. He reports current alcohol use. He reports that he does not use drugs. Medical:  has a past medical history of Actinic keratosis, Coronary artery disease, Dysplastic nevus (09/06/2014), Dysplastic nevus (07/09/2015), cardiac catheterization, Hyperlipidemia, and Hypertension. Surgical: Mr. Syler  has a past surgical history that includes Coronary artery bypass graft; Hernia repair; Appendectomy; Cardiac catheterization; Colonoscopy with propofol (N/A, 06/24/2018); Esophagogastroduodenoscopy (egd) with propofol (N/A, 06/24/2018); Exam under anesthesia with hemorrhoidectomy (N/A, 11/11/2018); Insertion of mesh (02/05/2022); and Finger surgery (Left). Family: family history includes Colon cancer in his  sister; Diabetes in his father; Healthy in his son and son; Hyperlipidemia in his mother; Hypertension in his father; Thyroid cancer in his sister.  Laboratory Chemistry Profile   Renal Lab Results  Component Value Date   BUN 13 06/23/2022   CREATININE 1.25 06/23/2022   BCR 10 06/23/2022   GFRAA 96 04/18/2020   GFRNONAA >60 01/29/2022    Hepatic Lab Results  Component Value Date   AST 23 01/28/2022   ALT 19 01/28/2022   ALBUMIN 4.4 06/23/2022   ALKPHOS 84 01/28/2022   LIPASE 23 06/14/2021    Electrolytes Lab Results  Component Value Date   NA 138 06/23/2022   K 4.8 06/23/2022   CL 97 06/23/2022   CALCIUM 9.2 06/23/2022   MG 2.0 01/28/2022   PHOS 2.7 (L) 06/23/2022    Bone Lab Results  Component Value Date   TESTOFREE 5.1 (L) 11/09/2019   TESTOSTERONE 263 (L) 08/16/2021    Inflammation (CRP: Acute Phase) (ESR: Chronic Phase) Lab Results  Component Value Date   CRP 6.0 08/19/2022   ESRSEDRATE 6 08/19/2022         Note: Above Lab results reviewed.  Physical Exam  General appearance: Well nourished, well developed, and well hydrated. In no apparent acute distress Mental status: Alert, oriented x 3 (person, place, & time)       Respiratory: No evidence of acute respiratory distress Eyes: PERLA Vitals: Ht 5\' 11"  (1.803 m)   Wt 161 lb (73 kg)   BMI 22.45 kg/m  BMI: Estimated body mass index is 22.45 kg/m as calculated from the following:   Height as of this encounter: 5\' 11"  (1.803 m).   Weight as of this encounter: 161 lb (73 kg). Ideal: Ideal body weight: 75.3 kg (166 lb 0.1 oz)  Low back and radiating bilateral hip and leg pain  Assessment   Diagnosis Status  1. Lumbar radiculopathy   2. Chronic radicular lumbar pain   3. Neuroforaminal stenosis of lumbar spine   4. Spinal stenosis, lumbar region, with neurogenic claudication   5. Chronic pain syndrome    Having a Flare-up Having a Flare-up Controlled     Plan of Care  Assessment and Plan     Chronic Pain   Chronic pain is currently managed with tramadol and Belbuca. Prefers non-surgical options but is scheduled to see Dr. Noralee Stain at [facility] for further evaluation. Discussed risks of delaying surgery, especially with aging, and potential benefits of decompression surgery. Refill tramadol and Belbuca prescriptions and send them to [facility] on February 25th. Follow  up with Dr. Noralee Stain for surgical evaluation.  Medication Management   Currently using tramadol and Belbuca, with concerns about Belbuca cost. Discussed a new non-opioid medication available in March, and there is interest in exploring this option. Refill tramadol and Belbuca prescriptions and send them to [facility] on February 25th. Consider the new non-opioid medication in March.  Follow-up   Send tramadol and Belbuca prescriptions to [facility] on February 25th. Follow up post-surgical consultation to discuss further treatment options.       Pharmacotherapy (Medications Ordered): Meds ordered this encounter  Medications   traMADol (ULTRAM) 50 MG tablet    Sig: Take 1 tablet (50 mg total) by mouth every 8 (eight) hours as needed for severe pain (pain score 7-10).    Dispense:  90 tablet    Refill:  2    Fill one day early if pharmacy is closed on scheduled refill date.   Buprenorphine HCl (BELBUCA) 750 MCG FILM    Sig: Place 1 Film inside cheek every 12 (twelve) hours.    Dispense:  60 each    Refill:  2   Orders:  Orders Placed This Encounter  Procedures   ToxASSU[employer] Select 13 (MW), Urine    Volume: 30 ml(s). Minimum 3 ml of urine is needed. Document temperature of fresh sample. Indications: Long term (current) use of opiate analgesic (T06.269)    Release to patient:   Immediate   Follow-up plan:   Return in about 3 months (around 02/14/2024).      Recent Visits Date Type Provider Dept  09/21/23 Procedure visit Edward Jolly, MD [facility]  09/01/23 Office Visit Edward Jolly, MD [facility]  Showing recent visits within past 90 days and meeting all other requirements Today's Visits Date Type Provider Dept  11/17/23 Office Visit Edward Jolly, MD [facility]  Showing today's visits and meeting all other requirements Future Appointments Date Type Provider Dept  02/09/24 Appointment Edward Jolly, MD [facility]  Showing future appointments within next 90 days and meeting all other requirements  I discussed the assessment and treatment plan with the patient. The patient was provided an opportunity to ask questions and all were answered. The patient agreed with the plan and demonstrated an understanding of the instructions.  Patient advised to call back or seek an in-person evaluation if the symptoms or condition worsens.  Duration of encounter: .  Total time on encounter, as per AMA guidelines included both the face-to-face and non-face-to-face time personally spent by the physician and/or other qualified health care professional(s) on the day of the encounter (includes time in activities that require the physician or other qualified health care professional and does not include time in activities normally performed by clinical staff). Physician's time may include the following activities when performed: Preparing to see the patient (e.g., pre-charting review of records, searching for previously ordered imaging, lab work, and nerve conduction tests) Review of prior analgesic pharmacotherapies. Reviewing PMP Interpreting ordered tests (e.g., lab work, imaging, nerve conduction tests) Performing post-procedure evaluations, including interpretation of diagnostic procedures Obtaining and/or reviewing separately obtained history Performing a medically appropriate examination and/or evaluation Counseling and educating the patient/family/caregiver Ordering medications, tests, or procedures Referring and communicating with other health care  professionals (when not separately reported) Documenting clinical information in the electronic or other health record Independently interpreting results (not separately reported) and communicating results to the patient/ family/caregiver Care coordination (not separately reported)  Note by: Edward Jolly, MD Date: 11/17/2023; Time:  3:26 PM

## 2023-11-19 ENCOUNTER — Encounter: Payer: 59 | Admitting: Student in an Organized Health Care Education/Training Program

## 2023-11-20 LAB — TOXASSURE SELECT 13 (MW), URINE

## 2023-11-23 ENCOUNTER — Other Ambulatory Visit: Payer: Self-pay

## 2023-11-23 DIAGNOSIS — G8929 Other chronic pain: Secondary | ICD-10-CM

## 2023-11-26 ENCOUNTER — Encounter: Payer: 59 | Admitting: Student in an Organized Health Care Education/Training Program

## 2023-12-08 ENCOUNTER — Ambulatory Visit
Admission: RE | Admit: 2023-12-08 | Discharge: 2023-12-08 | Disposition: A | Discharge status: Home / Self Care | Payer: 59 | Source: Ambulatory Visit

## 2023-12-08 DIAGNOSIS — M5441 Lumbago with sciatica, right side: Secondary | ICD-10-CM | POA: Insufficient documentation

## 2023-12-08 DIAGNOSIS — G8929 Other chronic pain: Secondary | ICD-10-CM | POA: Insufficient documentation

## 2023-12-08 DIAGNOSIS — M5442 Lumbago with sciatica, left side: Secondary | ICD-10-CM | POA: Insufficient documentation

## 2023-12-10 ENCOUNTER — Ambulatory Visit: Payer: 59 | Admitting: Dermatology

## 2023-12-10 ENCOUNTER — Encounter: Payer: Self-pay | Admitting: Dermatology

## 2023-12-10 DIAGNOSIS — Z7189 Other specified counseling: Secondary | ICD-10-CM

## 2023-12-10 DIAGNOSIS — L814 Other melanin hyperpigmentation: Secondary | ICD-10-CM | POA: Diagnosis not present

## 2023-12-10 DIAGNOSIS — L578 Other skin changes due to chronic exposure to nonionizing radiation: Secondary | ICD-10-CM | POA: Diagnosis not present

## 2023-12-10 DIAGNOSIS — L219 Seborrheic dermatitis, unspecified: Secondary | ICD-10-CM

## 2023-12-10 DIAGNOSIS — W908XXA Exposure to other nonionizing radiation, initial encounter: Secondary | ICD-10-CM | POA: Diagnosis not present

## 2023-12-10 DIAGNOSIS — L821 Other seborrheic keratosis: Secondary | ICD-10-CM

## 2023-12-10 DIAGNOSIS — Z79899 Other long term (current) drug therapy: Secondary | ICD-10-CM

## 2023-12-10 DIAGNOSIS — D229 Melanocytic nevi, unspecified: Secondary | ICD-10-CM

## 2023-12-10 DIAGNOSIS — Z1283 Encounter for screening for malignant neoplasm of skin: Secondary | ICD-10-CM | POA: Diagnosis not present

## 2023-12-10 DIAGNOSIS — D1801 Hemangioma of skin and subcutaneous tissue: Secondary | ICD-10-CM

## 2023-12-10 DIAGNOSIS — L739 Follicular disorder, unspecified: Secondary | ICD-10-CM

## 2023-12-10 DIAGNOSIS — Z86018 Personal history of other benign neoplasm: Secondary | ICD-10-CM

## 2023-12-10 MED ORDER — SULFACETAMIDE SOD-SULFUR WASH 9-4.5 % EX LIQD: CUTANEOUS | 6 refills | Status: AC

## 2023-12-10 MED ORDER — HYDROCORTISONE 2.5 % EX CREA: TOPICAL_CREAM | CUTANEOUS | 11 refills | Status: DC

## 2023-12-10 MED ORDER — KETOCONAZOLE 2 % EX SHAM: MEDICATED_SHAMPOO | CUTANEOUS | 0 refills | Status: AC

## 2023-12-10 MED ORDER — KETOCONAZOLE 2 % EX CREA: TOPICAL_CREAM | CUTANEOUS | 2 refills | Status: AC

## 2023-12-10 NOTE — Progress Notes (Signed)
 Follow-Up Visit   Subjective  Shawn Crawford is a 63 y.o. male who presents for the following: Skin Cancer Screening and Full Body Skin Exam, hx of Dysplastic nevus, patient c/o bumps on his scalp and recurrent rash in ears.   The patient presents for Total-Body Skin Exam (TBSE) for skin cancer screening and mole check. The patient has spots, moles and lesions to be evaluated, some may be new or changing and the patient may have concern these could be cancer.  The following portions of the chart were reviewed this encounter and updated as appropriate: medications, allergies, medical history  Review of Systems:  No other skin or systemic complaints except as noted in HPI or Assessment and Plan.  Objective  Well appearing patient in no apparent distress; mood and affect are within normal limits.  A full examination was performed including scalp, head, eyes, ears, nose, lips, neck, chest, axillae, abdomen, back, buttocks, bilateral upper extremities, bilateral lower extremities, hands, feet, fingers, toes, fingernails, and toenails. All findings within normal limits unless otherwise noted below.   Relevant physical exam findings are noted in the Assessment and Plan.   Assessment & Plan   SKIN CANCER SCREENING PERFORMED TODAY.  ACTINIC DAMAGE - Chronic condition, secondary to cumulative UV/sun exposure - diffuse scaly erythematous macules with underlying dyspigmentation - Recommend daily broad spectrum sunscreen SPF 30+ to sun-exposed areas, reapply every 2 hours as needed.  - Staying in the shade or wearing long sleeves, sun glasses (UVA+UVB protection) and wide brim hats (4-inch brim around the entire circumference of the hat) are also recommended for sun protection.  - Call for new or changing lesions.  LENTIGINES, SEBORRHEIC KERATOSES, HEMANGIOMAS - Benign normal skin lesions - Benign-appearing - Call for any changes  MELANOCYTIC NEVI - Tan-brown and/or pink-flesh-colored  symmetric macules and papules - Benign appearing on exam today - Observation - Call clinic for new or changing moles - Recommend daily use of broad spectrum spf 30+ sunscreen to sun-exposed areas.   FOLLICULITIS (bacterial versus yeast versus sterile folliculitis) scalp Exam: Perifollicular erythematous papules and pustules on the scalp  Folliculitis occurs due to inflammation of the superficial hair follicle (pore), resulting in acne-like lesions (pus bumps). It can be infectious (bacterial, fungal) or noninfectious (shaving, tight clothing, heat/sweat, medications).  Folliculitis can be acute or chronic and recommended treatment depends on the underlying cause of folliculitis. Treatment Plan: Start Skin medicinals Sulfacetamide Sodium 9% / Sulfur 3% Foaming Wash apply three times per week, massage into scalp and leave in for 10 minutes before rinsing out     SEBORRHEIC DERMATITIS Exam: Pink patches with greasy scale at ears  Chronic and persistent condition with duration or expected duration over one year. Condition is symptomatic/ bothersome to patient. Not currently at goal.  Seborrheic Dermatitis is a chronic persistent rash characterized by pinkness and scaling most commonly of the mid face but also can occur on the scalp (dandruff), ears; mid chest, mid back and groin.  It tends to be exacerbated by stress and cooler weather.  People who have neurologic disease may experience new onset or exacerbation of existing seborrheic dermatitis.  The condition is not curable but treatable and can be controlled. Treatment Plan: Start otc skin medicinals Sulfacetamide Sodium 9% / Sulfur 3% Foaming Wash Start Ketoconazole shampoo prn Start Ketoconazole cream apply to ears Mon, Wed, Fri Start Hydrocortisone cream apply to ears Tues, thurs, Sat   HISTORY OF DYSPLASTIC NEVUS No evidence of recurrence today Recommend regular full  body skin exams Recommend daily broad spectrum sunscreen SPF 30+ to  sun-exposed areas, reapply every 2 hours as needed.  Call if any new or changing lesions are noted between office visits   Return in about 1 year (around 12/09/2024) for TBSE, hx of Dysplastic nevus .  IAngelique Holm, CMA, am acting as scribe for Armida Sans, MD .   Documentation: I have reviewed the above documentation for accuracy and completeness, and I agree with the above.  Armida Sans, MD

## 2023-12-10 NOTE — Patient Instructions (Addendum)
 Instructions for [facility] Medications  One or more of your medications was sent to the [facility] mail order compounding pharmacy. You will receive an email from them and can purchase the medicine through that link. It will then be mailed to your home at the address you confirmed. If for any reason you do not receive an email from them, please check your spam folder. If you still do not find the email, please let us know. [facility] phone number is [phone].       Due to recent changes in healthcare laws, you may see results of your pathology and/or laboratory studies on MyChart before the doctors have had a chance to review them. We understand that in some cases there may be results that are confusing or concerning to you. Please understand that not all results are received at the same time and often the doctors may need to interpret multiple results in order to provide you with the best plan of care or course of treatment. Therefore, we ask that you please give [place] 2 business days to thoroughly review all your results before contacting the office for clarification. Should we see a critical lab result, you will be contacted sooner.   If You Need Anything After Your Visit  If you have any questions or concerns for your doctor, please call our main line at [phone] and press option 4 to reach your doctor's medical assistant. If no one answers, please leave a voicemail as directed and we will return your call as soon as possible. Messages left after 4 pm will be answered the following business day.   You may also send [place] a message via MyChart. We typically respond to MyChart messages within 1-2 business days.  For prescription refills, please ask your pharmacy to contact our office. Our fax number is [phone].  If you have an urgent issue when the clinic is closed that cannot wait until the next business day, you can page your doctor at the number below.    Please note  that while we do our best to be available for urgent issues outside of office hours, we are not available 24/7.   If you have an urgent issue and are unable to reach [place], you may choose to seek medical care at your doctor's office, retail clinic, urgent care center, or emergency room.  If you have a medical emergency, please immediately call 911 or go to the emergency department.  Pager Numbers  - Dr. Gwen Pounds: [phone]  - Dr. Roseanne Reno: [phone]  - Dr. Katrinka Blazing: [phone]   In the event of inclement weather, please call our main line at [phone] for an update on the status of any delays or closures.  Dermatology Medication Tips: Please keep the boxes that topical medications come in in order to help keep track of the instructions about where and how to use these. Pharmacies typically print the medication instructions only on the boxes and not directly on the medication tubes.   If your medication is too expensive, please contact our office at [phone] option 4 or send [place] a message through MyChart.   We are unable to tell what your co-pay for medications will be in advance as this is different depending on your insurance coverage. However, we may be able to find a substitute medication at lower cost or fill out paperwork to get insurance to cover a needed medication.   If a prior authorization is required to get your medication covered by your  insurance company, please allow [place] 1-2 business days to complete this process.  Drug prices often vary depending on where the prescription is filled and some pharmacies may offer cheaper prices.  The website www.goodrx.com contains coupons for medications through different pharmacies. The prices here do not account for what the cost may be with help from insurance (it may be cheaper with your insurance), but the website can give you the price if you did not use any insurance.  - You can print the associated coupon and take it with your  prescription to the pharmacy.  - You may also stop by our office during regular business hours and pick up a GoodRx coupon card.  - If you need your prescription sent electronically to a different pharmacy, notify our office through [facility] or by phone at [phone] option 4.     Si Usted Necesita Algo Despus de Su Visita  Tambin puede enviarnos un mensaje a travs de Clinical cytogeneticist. Por lo general respondemos a los mensajes de MyChart en el transcurso de 1 a 2 das hbiles.  Para renovar recetas, por favor pida a su farmacia que se ponga en contacto con nuestra oficina. Annie Sable de fax es [place] [phone].  Si tiene un asunto urgente cuando la clnica est cerrada y que no puede esperar hasta el siguiente da hbil, puede llamar/localizar a su doctor(a) al nmero que aparece a continuacin.   Por favor, tenga en cuenta que aunque hacemos todo lo posible para estar disponibles para asuntos urgentes fuera del horario de [place], no estamos disponibles las 24 horas del da, los 7 [address]  [address] de la [place].   Si tiene un problema urgente y no puede comunicarse con nosotros, puede optar por buscar atencin mdica  en el consultorio de su doctor(a), en una clnica privada, en un centro de atencin urgente o en una sala de emergencias.  Si tiene Engineer, drilling, por favor llame inmediatamente al 911 o vaya a la sala de emergencias.  Nmeros de bper  - Dr. Gwen Pounds: [phone]  - Dra. Roseanne Reno: [phone]  - Dr. Katrinka Blazing: [phone]   En caso de inclemencias del tiempo, por favor llame a Lacy Duverney principal al [phone] para una actualizacin sobre el [place] de cualquier retraso o cierre.  Consejos para la medicacin en dermatologa: Por favor, guarde las cajas en las que vienen los medicamentos de uso tpico para ayudarle a seguir las instrucciones sobre dnde y cmo usarlos. Las farmacias generalmente imprimen las instrucciones del medicamento slo en las cajas y no  directamente en los tubos del [place].   Si su medicamento es muy caro, por favor, pngase en contacto con Rolm Gala llamando al [phone] y presione la opcin 4 o envenos un mensaje a travs de Clinical cytogeneticist.   No podemos decirle cul ser su copago por los medicamentos por adelantado ya que esto es diferente dependiendo de la cobertura de su seguro. Sin embargo, es posible que podamos encontrar un medicamento sustituto a Audiological scientist un formulario para que el seguro cubra el medicamento que se considera necesario.   Si se requiere una autorizacin previa para que su compaa de seguros [place] su medicamento, por favor permtanos de 1 a 2 das hbiles para completar [address]  Los precios de los medicamentos varan con frecuencia dependiendo del Environmental consultant de dnde se surte la receta y alguna farmacias pueden ofrecer precios ms baratos.  El sitio web www.goodrx.com tiene cupones para medicamentos de Health and safety inspector. Los precios aqu no tienen  en cuenta lo que podra costar con la ayuda del seguro (puede ser ms barato con su seguro), pero el sitio web puede darle el precio si no Visual merchandiser.  - Puede imprimir el cupn correspondiente y llevarlo con su receta a la farmacia.  - Tambin puede pasar por nuestra oficina durante el horario de atencin regular y Education officer, museum una tarjeta de cupones de GoodRx.  - Si necesita que su receta se enve electrnicamente a una farmacia diferente, informe a nuestra oficina a travs de MyChart de [place] o por telfono llamando al [phone] y presione la opcin 4.

## 2023-12-14 ENCOUNTER — Encounter: Payer: Self-pay | Admitting: Dermatology

## 2023-12-14 ENCOUNTER — Other Ambulatory Visit: Payer: Self-pay

## 2023-12-14 MED ORDER — HYDROCORTISONE 2.5 % EX CREA: TOPICAL_CREAM | CUTANEOUS | 11 refills | Status: AC

## 2024-01-04 ENCOUNTER — Other Ambulatory Visit: Payer: Self-pay | Admitting: Neurological Surgery

## 2024-01-04 DIAGNOSIS — R292 Abnormal reflex: Secondary | ICD-10-CM

## 2024-01-10 ENCOUNTER — Ambulatory Visit
Admission: RE | Admit: 2024-01-10 | Discharge: 2024-01-10 | Disposition: A | Discharge status: Home / Self Care | Source: Ambulatory Visit | Attending: Neurological Surgery | Admitting: Neurological Surgery

## 2024-01-10 DIAGNOSIS — R292 Abnormal reflex: Secondary | ICD-10-CM | POA: Insufficient documentation

## 2024-02-09 ENCOUNTER — Ambulatory Visit
Payer: 59 | Attending: Student in an Organized Health Care Education/Training Program | Admitting: Student in an Organized Health Care Education/Training Program

## 2024-02-09 ENCOUNTER — Encounter: Payer: Self-pay | Admitting: Student in an Organized Health Care Education/Training Program

## 2024-02-09 VITALS — BP 120/82 | HR 86 | Temp 98.4°F | Ht 71.0 in | Wt 161.0 lb

## 2024-02-09 DIAGNOSIS — G894 Chronic pain syndrome: Secondary | ICD-10-CM | POA: Diagnosis present

## 2024-02-09 DIAGNOSIS — G8929 Other chronic pain: Secondary | ICD-10-CM | POA: Insufficient documentation

## 2024-02-09 DIAGNOSIS — M5416 Radiculopathy, lumbar region: Secondary | ICD-10-CM | POA: Insufficient documentation

## 2024-02-09 DIAGNOSIS — M48062 Spinal stenosis, lumbar region with neurogenic claudication: Secondary | ICD-10-CM | POA: Diagnosis present

## 2024-02-09 DIAGNOSIS — M48061 Spinal stenosis, lumbar region without neurogenic claudication: Secondary | ICD-10-CM | POA: Diagnosis present

## 2024-02-09 MED ORDER — TRAMADOL HCL 50 MG PO TABS: 50.0000 mg | ORAL_TABLET | Freq: Three times a day (TID) | ORAL | 2 refills | Status: DC | PRN

## 2024-02-09 MED ORDER — JOURNAVX 50 MG PO TABS: 50.0000 mg | ORAL_TABLET | Freq: Two times a day (BID) | ORAL | 2 refills | Status: DC

## 2024-02-09 MED ORDER — BELBUCA 750 MCG BU FILM: 1.0000 | ORAL_FILM | Freq: Two times a day (BID) | BUCCAL | 2 refills | Status: DC

## 2024-02-09 NOTE — Progress Notes (Signed)
 PROVIDER NOTE: Interpretation of information contained herein should be left to medically-trained personnel. Specific patient instructions are provided elsewhere under "Patient Instructions" section of medical record. This document was created in part using AI and STT-dictation technology, any transcriptional errors that may result from this process are unintentional.  Patient: Shawn Crawford  Service: E/M   PCP: Nikki Barters, MD  DOB: [birth year]  DOS: 02/09/2024  Provider: Cephus Collin, MD  MRN: [id number]  Delivery: Face-to-face  Specialty: Interventional Pain Management  Type: Established Patient  Setting: Ambulatory outpatient facility  Specialty designation: 09  Referring Prov.: Nikki Barters, MD  Location: Outpatient office facility       HPI  Shawn Crawford, a 63 y.o. year old male, is here today because of his Lumbar radiculopathy [M54.16]. Shawn Crawford primary complain today is Back Pain (lower)  Pertinent problems: Shawn Crawford has Arteriosclerosis of coronary artery; Chronic radicular lumbar pain; Benign essential HTN; Chronic pain syndrome; Chronic midline low back pain without sciatica; Lumbar facet arthropathy; Lumbar degenerative disc disease; and Piriformis muscle pain on their pertinent problem list. Pain Assessment: Severity of Chronic pain is reported as a 4 /10. Location: Back Right, Left/down both hip to his buttock. Onset: More than a month ago. Quality: Aching. Timing: Constant. Modifying factor(s): laying on his side with a pillow between his leg, meds and patch, TEN's. Vitals:  height is 5\' 11"  (1.803 m) and weight is 161 lb (73 kg). His temperature is 98.4 F (36.9 C). His blood pressure is 120/82 and his pulse is 86. His oxygen saturation is 98%.  BMI: Estimated body mass index is 22.45 kg/m as calculated from the following:   Height as of this encounter: 5\' 11"  (1.803 m).   Weight as of this encounter: 161 lb (73 kg). Last encounter: 11/17/2023. Last procedure:  09/21/2023.  Reason for encounter:   Discussed the use of AI scribe software for clinical note transcription with the patient, who gave verbal consent to proceed.  History of Present Illness   Shawn Crawford "[name]" is a 63 year old male with spinal stenosis who presents for pain management follow-up. He was referred by Dr. Alleen Arbour for continued pain management after an MRI showed no surgical intervention was needed.  He has been experiencing persistent back and leg pain. A recent MRI revealed two areas of stenosis. He continues with his pain management routine.  His current pain management regimen includes Belbuca  at a dose of 750 mcg and tramadol , last refilled on January 14, 2024. He is considering the addition of a new non-opioid medication, Jornvax, with an initial dose of 100 mg taken orally on an empty stomach, followed by 50 mg every 12 hours.  He experiences short-term relief from steroid injections but is exploring more durable long-term therapies such as spinal cord stimulation or peripheral nerve stimulation.       Pharmacotherapy Assessment  Analgesic:   Belbuca  750 mcg BID, Tramadol  50 mg TID prn, add Journvax as above  Monitoring: [place] PMP: PDMP reviewed during this encounter.       Pharmacotherapy: No side-effects or adverse reactions reported. Compliance: No problems identified. Effectiveness: Clinically acceptable.  Kathee Palm, RN  02/09/2024  9:46 AM  Sign when Signing Visit Nursing Pain Medication Assessment:  Safety precautions to be maintained throughout the outpatient stay will include: orient to surroundings, keep bed in low position, maintain call bell within reach at all times, provide assistance with transfer out of bed and ambulation.  Medication Inspection Compliance: Pill count conducted under aseptic conditions, in front of the patient. Neither the pills nor the bottle was removed from the patient's sight at any time. Once count was completed pills were  immediately returned to the patient in their original bottle.  Medication #1: Tramadol  (Ultram ) Pill/Patch Count: 37 of 90 pills/patches remain Pill/Patch Appearance: Markings consistent with prescribed medication Bottle Appearance: Standard pharmacy container. Clearly labeled. Filled Date: 4 / 17 / 2025 Last Medication intake:  Yesterday  Medication #2: Buprenorphine  (Suboxone) Pill/Patch Count: 23 of 60 pills/patches remain Pill/Patch Appearance: Markings consistent with prescribed medication Bottle Appearance: Standard pharmacy container. Clearly labeled. Filled Date: 4 / 22 / 2025 Last Medication intake:  TodaySafety precautions to be maintained throughout the outpatient stay will include: orient to surroundings, keep bed in low position, maintain call bell within reach at all times, provide assistance with transfer out of bed and ambulation.     No results found for: "CBDTHCR" No results found for: "D8THCCBX" No results found for: "D9THCCBX"  UDS:  Summary  Date Value Ref Range Status  11/17/2023 FINAL  Final    Comment:    ==================================================================== [employer] Select 13 (MW) ==================================================================== Test                             Result       Flag       Units  Drug Present and Declared for Prescription Verification   Buprenorphine                   70           EXPECTED   ng/mg creat   Norbuprenorphine               90           EXPECTED   ng/mg creat    Source of buprenorphine  is a scheduled prescription medication.    Norbuprenorphine is an expected metabolite of buprenorphine .    Tramadol                        3127         EXPECTED   ng/mg creat   O-Desmethyltramadol            >4032        EXPECTED   ng/mg creat   N-Desmethyltramadol            160          EXPECTED   ng/mg creat    Source of tramadol  is a prescription medication. O-desmethyltramadol    and N-desmethyltramadol are  expected metabolites of tramadol .  ==================================================================== Test                      Result    Flag   Units      Ref Range   Creatinine              124              mg/dL      >=09 ==================================================================== Declared Medications:  The flagging and interpretation on this report are based on the  following declared medications.  Unexpected results may arise from  inaccuracies in the declared medications.   **Note: The testing scope of this panel includes these medications:   Tramadol  (Ultram )   **Note: The testing scope of this panel does not include small to  moderate amounts of these reported medications:   Buprenorphine  (Belbuca )   **Note: The testing scope of this panel does not include the  following reported medications:   Amlodipine  (Norvasc )  Aspirin   Eszopiclone  (Lunesta )  Multivitamin  Rosuvastatin  (Crestor )  Tadalafil  (Cialis )  Testosterone   Vitamin B12  Zolpidem  (Ambien ) ==================================================================== For clinical consultation, please call [phone]. ====================================================================       ROS  Constitutional: Denies any fever or chills Gastrointestinal: No reported hemesis, hematochezia, vomiting, or acute GI distress Musculoskeletal: Denies any acute onset joint swelling, redness, loss of ROM, or weakness Neurological: No reported episodes of acute onset apraxia, aphasia, dysarthria, agnosia, amnesia, paralysis, loss of coordination, or loss of consciousness  Medication Review  Buprenorphine  HCl, Multiple Vitamin, NEEDLE (DISP) 25 G, SYRINGE-NEEDLE (DISP) 3 ML, Sulfacetamide  Sod-Sulfur  Wash, Suzetrigine, amLODipine , aspirin  EC, cyanocobalamin, hydrocortisone , ketoconazole , rosuvastatin , tadalafil , testosterone  cypionate, traMADol , and zolpidem   History Review  Allergy: Shawn Crawford is allergic to  penicillins. Drug: Shawn Crawford  reports no history of drug use. Alcohol:  reports current alcohol use. Tobacco:  reports that he has never smoked. He has never been exposed to tobacco smoke. He has never used smokeless tobacco. Social: Shawn Crawford  reports that he has never smoked. He has never been exposed to tobacco smoke. He has never used smokeless tobacco. He reports current alcohol use. He reports that he does not use drugs. Medical:  has a past medical history of Actinic keratosis, Coronary artery disease, Dysplastic nevus (09/06/2014), Dysplastic nevus (07/09/2015), cardiac catheterization, Hyperlipidemia, and Hypertension. Surgical: Shawn Crawford  has a past surgical history that includes Coronary artery bypass graft; Hernia repair; Appendectomy; Cardiac catheterization; Colonoscopy with propofol  (N/A, 06/24/2018); Esophagogastroduodenoscopy (egd) with propofol  (N/A, 06/24/2018); Exam under anesthesia with hemorrhoidectomy (N/A, 11/11/2018); Insertion of mesh (02/05/2022); and Finger surgery (Left). Family: family history includes Colon cancer in his sister; Diabetes in his father; Healthy in his son and son; Hyperlipidemia in his mother; Hypertension in his father; Thyroid cancer in his sister.  Laboratory Chemistry Profile   Renal Lab Results  Component Value Date   BUN 13 06/23/2022   CREATININE 1.25 06/23/2022   BCR 10 06/23/2022   GFRAA 96 04/18/2020   GFRNONAA >60 01/29/2022    Hepatic Lab Results  Component Value Date   AST 23 01/28/2022   ALT 19 01/28/2022   ALBUMIN 4.4 06/23/2022   ALKPHOS 84 01/28/2022   LIPASE 23 06/14/2021    Electrolytes Lab Results  Component Value Date   NA 138 06/23/2022   K 4.8 06/23/2022   CL 97 06/23/2022   CALCIUM  9.2 06/23/2022   MG 2.0 01/28/2022   PHOS 2.7 (L) 06/23/2022    Bone Lab Results  Component Value Date   TESTOFREE 5.1 (L) 11/09/2019   TESTOSTERONE  263 (L) 08/16/2021    Inflammation (CRP: Acute Phase) (ESR: Chronic Phase) Lab  Results  Component Value Date   CRP 6.0 08/19/2022   ESRSEDRATE 6 08/19/2022         Note: Above Lab results reviewed.  Recent Imaging Review  MR THORACIC SPINE WO CONTRAST CLINICAL DATA:  Chronic back pain, hyperreflexia  EXAM: MRI THORACIC SPINE WITHOUT CONTRAST  TECHNIQUE: Multiplanar, multisequence MR imaging of the thoracic spine was performed. No intravenous contrast was administered.  COMPARISON:  MRI of the thoracic spine dated 05/12/2022  FINDINGS: Alignment:  Physiologic.  Vertebrae: No fracture, evidence of discitis, or bone lesion.  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  Paraspinal and other soft tissues: Negative.  Disc levels:  No high-grade canal or foraminal stenosis.  IMPRESSION: 1. 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 2. No high-grade canal or foraminal stenosis.  Electronically Signed   By: Johnanna Mylar M.D.   On: 02/03/2024 09:56 Note: Reviewed         Physical Exam  General appearance: Well nourished, well developed, and well hydrated. In no apparent acute distress Mental status: Alert, oriented x 3 (person, place, & time)       Respiratory: No evidence of acute respiratory distress Eyes: PERLA Vitals: BP 120/82   Pulse 86   Temp 98.4 F (36.9 C)   Ht 5\' 11"  (1.803 m)   Wt 161 lb (73 kg)   SpO2 98%   BMI 22.45 kg/m  BMI: Estimated body mass index is 22.45 kg/m as calculated from the following:   Height as of this encounter: 5\' 11"  (1.803 m).   Weight as of this encounter: 161 lb (73 kg). Ideal: Ideal body weight: 75.3 kg (166 lb 0.1 oz)  Assessment   Diagnosis Status  1. Lumbar radiculopathy   2. Chronic radicular lumbar pain   3. Neuroforaminal stenosis of lumbar spine   4. Spinal stenosis, lumbar region, with neurogenic claudication   5. Chronic  pain syndrome    Controlled Controlled Controlled   Updated Problems: No problems updated.  Plan of Care   Assessment and Plan    Spinal stenosis   He has spinal stenosis with two stenosed areas. MRI results show narrow pathways, and Dr. Alleen Arbour does not recommend surgical intervention. Pain management and physical therapy are advised. Advanced pain management options, including a spinal cord stimulator for back and/or leg pain and a peripheral nerve stimulator for localized back pain, were discussed. A trial period for the spinal cord stimulator is available to assess effectiveness before permanent implantation. Continue current pain management routine and consider physical therapy. Discuss advanced pain management options such as spinal cord stimulator and peripheral nerve stimulator.  Chronic pain management   Chronic pain management continues with Belbuca  and tramadol . Jornvax, a non-opioid medication covered by his insurance, is being considered to potentially transition off opioids. He is instructed to take Jornvax 100 mg orally on an empty stomach, followed by 50 mg every 12 hours, and report back on its effectiveness. Transitioning off opioids will involve a gradual reduction in Belbuca  dosage. Prescribe Jornvax with the specified dosing regimen. Continue Belbuca  and tramadol , and refill his prescriptions. Monitor response to Jornvax and report back in one month.        Pharmacotherapy (Medications Ordered): Meds ordered this encounter  Medications   Suzetrigine (JOURNAVX) 50 MG TABS    Sig: Take 50 mg by mouth every 12 (twelve) hours.    Dispense:  60 tablet    Refill:  2   Buprenorphine  HCl (BELBUCA ) 750 MCG FILM    Sig: Place 1 Film inside cheek every 12 (twelve) hours.    Dispense:  60 each    Refill:  2   traMADol  (ULTRAM ) 50 MG tablet    Sig: Take 1 tablet (50 mg total) by mouth every 8 (eight) hours as needed for severe pain (pain score 7-10).    Dispense:  90 tablet     Refill:  2    Fill one day early if pharmacy is closed on scheduled refill date.  Orders:  No orders of the defined types were placed in this encounter.  Follow-up plan:   Return in about 3 months (around 05/11/2024) for MM, F2F.    Recent Visits Date Type Provider Dept  11/17/23 Office Visit Cephus Collin, MD [facility]  Showing recent visits within past 90 days and meeting all other requirements Today's Visits Date Type Provider Dept  02/09/24 Office Visit Cephus Collin, MD [facility]  Showing today's visits and meeting all other requirements Future Appointments No visits were found meeting these conditions. Showing future appointments within next 90 days and meeting all other requirements   I discussed the assessment and treatment plan with the patient. The patient was provided an opportunity to ask questions and all were answered. The patient agreed with the plan and demonstrated an understanding of the instructions.  Patient advised to call back or seek an in-person evaluation if the symptoms or condition worsens.  Duration of encounter: .  Total time on encounter, as per AMA guidelines included both the face-to-face and non-face-to-face time personally spent by the physician and/or other qualified health care professional(s) on the day of the encounter (includes time in activities that require the physician or other qualified health care professional and does not include time in activities normally performed by clinical staff). Physician's time may include the following activities when performed: Preparing to see the patient (e.g., pre-charting review of records, searching for previously ordered imaging, lab work, and nerve conduction tests) Review of prior analgesic pharmacotherapies. Reviewing PMP Interpreting ordered tests (e.g., lab work, imaging, nerve conduction tests) Performing post-procedure evaluations, including interpretation of  diagnostic procedures Obtaining and/or reviewing separately obtained history Performing a medically appropriate examination and/or evaluation Counseling and educating the patient/family/caregiver Ordering medications, tests, or procedures Referring and communicating with other health care professionals (when not separately reported) Documenting clinical information in the electronic or other health record Independently interpreting results (not separately reported) and communicating results to the patient/ family/caregiver Care coordination (not separately reported)  Note by: Cephus Collin, MD (TTS and AI technology used. I apologize for any typographical errors that were not detected and corrected.) Date: 02/09/2024; Time: 11:00 AM

## 2024-02-09 NOTE — Progress Notes (Signed)
 Nursing Pain Medication Assessment:  Safety precautions to be maintained throughout the outpatient stay will include: orient to surroundings, keep bed in low position, maintain call bell within reach at all times, provide assistance with transfer out of bed and ambulation.  Medication Inspection Compliance: Pill count conducted under aseptic conditions, in front of the patient. Neither the pills nor the bottle was removed from the patient's sight at any time. Once count was completed pills were immediately returned to the patient in their original bottle.  Medication #1: Tramadol  (Ultram ) Pill/Patch Count: 37 of 90 pills/patches remain Pill/Patch Appearance: Markings consistent with prescribed medication Bottle Appearance: Standard pharmacy container. Clearly labeled. Filled Date: 4 / 17 / 2025 Last Medication intake:  Yesterday  Medication #2: Buprenorphine  (Suboxone) Pill/Patch Count: 23 of 60 pills/patches remain Pill/Patch Appearance: Markings consistent with prescribed medication Bottle Appearance: Standard pharmacy container. Clearly labeled. Filled Date: 4 / 22 / 2025 Last Medication intake:  TodaySafety precautions to be maintained throughout the outpatient stay will include: orient to surroundings, keep bed in low position, maintain call bell within reach at all times, provide assistance with transfer out of bed and ambulation.

## 2024-02-23 ENCOUNTER — Telehealth: Payer: Self-pay

## 2024-02-23 NOTE — Telephone Encounter (Signed)
 PA done for Journavx via [facility]  Key [id number]

## 2024-02-23 NOTE — Telephone Encounter (Signed)
 Telephone call from patient stating that the pharmacy says the his insurance will only cover a 15 day supply of his new medication Suzetrigine (JOURNAVX) 50 MG. Will need PA.

## 2024-05-12 ENCOUNTER — Ambulatory Visit
Attending: Student in an Organized Health Care Education/Training Program | Admitting: Student in an Organized Health Care Education/Training Program

## 2024-05-12 ENCOUNTER — Encounter: Payer: Self-pay | Admitting: Student in an Organized Health Care Education/Training Program

## 2024-05-12 VITALS — BP 135/89 | HR 89 | Temp 97.0°F | Resp 20 | Ht 71.0 in | Wt 162.0 lb

## 2024-05-12 DIAGNOSIS — G8929 Other chronic pain: Secondary | ICD-10-CM | POA: Diagnosis present

## 2024-05-12 DIAGNOSIS — G894 Chronic pain syndrome: Secondary | ICD-10-CM | POA: Insufficient documentation

## 2024-05-12 DIAGNOSIS — M5416 Radiculopathy, lumbar region: Secondary | ICD-10-CM | POA: Diagnosis present

## 2024-05-12 MED ORDER — JOURNAVX 50 MG PO TABS: 50.0000 mg | ORAL_TABLET | Freq: Two times a day (BID) | ORAL | 1 refills | Status: AC

## 2024-05-12 MED ORDER — BELBUCA 750 MCG BU FILM: 1.0000 | ORAL_FILM | Freq: Two times a day (BID) | BUCCAL | 2 refills | Status: DC

## 2024-05-12 MED ORDER — TRAMADOL HCL 50 MG PO TABS: 50.0000 mg | ORAL_TABLET | Freq: Three times a day (TID) | ORAL | 2 refills | Status: DC | PRN

## 2024-05-12 NOTE — Progress Notes (Signed)
 Nursing Pain Medication Assessment:  Safety precautions to be maintained throughout the outpatient stay will include: orient to surroundings, keep bed in low position, maintain call bell within reach at all times, provide assistance with transfer out of bed and ambulation.  Medication Inspection Compliance: Pill count conducted under aseptic conditions, in front of the patient. Neither the pills nor the bottle was removed from the patient's sight at any time. Once count was completed pills were immediately returned to the patient in their original bottle.  Medication: Tramadol (Ultram) Pill/Patch Count: 69 of 90 pills/patches remain Pill/Patch Appearance: Markings consistent with prescribed medication Bottle Appearance: Standard pharmacy container. Clearly labeled. Filled Date: 07 / 25 / 2025 Last Medication intake:  Yesterday  Nursing Pain Medication Assessment:  Safety precautions to be maintained throughout the outpatient stay will include: orient to surroundings, keep bed in low position, maintain call bell within reach at all times, provide assistance with transfer out of bed and ambulation.  Medication Inspection Compliance: Pill count conducted under aseptic conditions, in front of the patient. Neither the pills nor the bottle was removed from the patient's sight at any time. Once count was completed pills were immediately returned to the patient in their original bottle.  Medication: Belbuca Pill/Patch Count: 26 of 60 patches remain Pill/Patch Appearance: Markings consistent with prescribed medication Bottle Appearance: Standard pharmacy container. Clearly labeled. Filled Date: 07 / 25 / 2025 Last Medication intake:  Today

## 2024-05-12 NOTE — Progress Notes (Signed)
 PROVIDER NOTE: Interpretation of information contained herein should be left to medically-trained personnel. Specific patient instructions are provided elsewhere under Patient Instructions section of medical record. This document was created in part using AI and STT-dictation technology, any transcriptional errors that may result from this process are unintentional.  Patient: Shawn Crawford  Service: E/M   PCP: Bertrum Charlie CROME, MD  DOB: [birth year]  DOS: 05/12/2024  Provider: Wallie Sherry, MD  MRN: [id number]  Delivery: Face-to-face  Specialty: Interventional Pain Management  Type: Established Patient  Setting: Ambulatory outpatient facility  Specialty designation: 09  Referring Prov.: Bertrum Charlie CROME, MD  Location: Outpatient office facility       History of present illness (HPI) [name], a 63 y.o. year old male, is here today because of his Chronic pain syndrome [G89.4]. [name] primary complain today is Back Pain  Pertinent problems: Shawn Crawford has Arteriosclerosis of coronary artery; Chronic radicular lumbar pain; Benign essential HTN; Chronic pain syndrome; Chronic midline low back pain without sciatica; Lumbar facet arthropathy; Lumbar degenerative disc disease; and Piriformis muscle pain on their pertinent problem list.  Pain Assessment: Severity of Chronic pain is reported as a 3 /10. Location: Back Lower/Lower baxk, radiates into Buttock and back of the thighs. Onset: More than a month ago. Quality: Aching. Timing: Constant (Worse in the afternoon towards evening). Modifying factor(s): Laing on side with pillow between legs, and medication. Vitals:  height is 5' 11 (1.803 m) and weight is 162 lb (73.5 kg). His temperature is 97 F (36.1 C) (abnormal). His blood pressure is 135/89 and his pulse is 89. His respiration is 20 and oxygen saturation is 100%.  BMI: Estimated body mass index is 22.59 kg/m as calculated from the following:   Height as of this encounter: 5' 11 (1.803  m).   Weight as of this encounter: 162 lb (73.5 kg).  Last encounter: 02/09/2024. Last procedure: 09/21/2023.  Reason for encounter:   Discussed the use of AI scribe software for clinical note transcription with the patient, who gave verbal consent to proceed.  History of Present Illness   Shawn Crawford is a 63 year old male who presents for follow-up regarding pain management.  At his last visit we did a trial of Journavax which he found effective.  He has experienced a change in his pain symptoms, noting that while the pain is not as intense, it feels different and somewhat masked. He describes feeling better overall during the period he took the medication.  The cost of the medication is approximately $330 for a 15-day supply, which is not covered by his insurance. He has been in contact with both the insurance company and the pharmacy regarding this issue.  He was provided with a sample of the medication, which he found effective, and he did not experience any side effects such as sedation, dizziness, or drowsiness. He felt good throughout the period he took the medication.  He was given 30 tablets for 15 days, taking 50 mg every 12 hours.  He is planning a trip in two weeks to the [place].  Otherwise he continues his belbuca and tramadol as prescribed, no change in dose.  Is doing well from a lumbar radicular pain standpoint and does not require a lumbar epidural steroid injection at this time.     Pharmacotherapy Assessment   Belbuca 750 mcg BID, Tramadol 50 mg TID prn   Monitoring: [place] PMP: PDMP reviewed during this encounter.  Pharmacotherapy: No side-effects or adverse reactions reported. Compliance: No problems identified. Effectiveness: Clinically acceptable.  Shawn Crawford, [place]  05/12/2024  9:46 AM  Sign when Signing Visit Nursing Pain Medication Assessment:  Safety precautions to be maintained throughout the outpatient stay will include: orient to  surroundings, keep bed in low position, maintain call bell within reach at all times, provide assistance with transfer out of bed and ambulation.  Medication Inspection Compliance: Pill count conducted under aseptic conditions, in front of the patient. Neither the pills nor the bottle was removed from the patient's sight at any time. Once count was completed pills were immediately returned to the patient in their original bottle.  Medication: Tramadol (Ultram) Pill/Patch Count: 69 of 90 pills/patches remain Pill/Patch Appearance: Markings consistent with prescribed medication Bottle Appearance: Standard pharmacy container. Clearly labeled. Filled Date: 07 / 25 / 2025 Last Medication intake:  Yesterday  Nursing Pain Medication Assessment:  Safety precautions to be maintained throughout the outpatient stay will include: orient to surroundings, keep bed in low position, maintain call bell within reach at all times, provide assistance with transfer out of bed and ambulation.  Medication Inspection Compliance: Pill count conducted under aseptic conditions, in front of the patient. Neither the pills nor the bottle was removed from the patient's sight at any time. Once count was completed pills were immediately returned to the patient in their original bottle.  Medication: Belbuca Pill/Patch Count: 26 of 60 patches remain Pill/Patch Appearance: Markings consistent with prescribed medication Bottle Appearance: Standard pharmacy container. Clearly labeled. Filled Date: 07 / 25 / 2025 Last Medication intake:  Today  UDS:  Summary  Date Value Ref Range Status  11/17/2023 FINAL  Final    Comment:    ==================================================================== [employer] Select 13 (MW) ==================================================================== Test                             Result       Flag       Units  Drug Present and Declared for Prescription Verification   Buprenorphine                   70           EXPECTED   ng/mg creat   Norbuprenorphine               90           EXPECTED   ng/mg creat    Source of buprenorphine is a scheduled prescription medication.    Norbuprenorphine is an expected metabolite of buprenorphine.    Tramadol                       3127         EXPECTED   ng/mg creat   O-Desmethyltramadol            >4032        EXPECTED   ng/mg creat   N-Desmethyltramadol            160          EXPECTED   ng/mg creat    Source of tramadol is a prescription medication. O-desmethyltramadol    and N-desmethyltramadol are expected metabolites of tramadol.  ==================================================================== Test                      Result    Flag   Units  Ref Range   Creatinine              124              mg/dL      >=79 ==================================================================== Declared Medications:  The flagging and interpretation on this report are based on the  following declared medications.  Unexpected results may arise from  inaccuracies in the declared medications.   **Note: The testing scope of this panel includes these medications:   Tramadol (Ultram)   **Note: The testing scope of this panel does not include small to  moderate amounts of these reported medications:   Buprenorphine (Belbuca)   **Note: The testing scope of this panel does not include the  following reported medications:   Amlodipine (Norvasc)  Aspirin  Eszopiclone (Lunesta)  Multivitamin  Rosuvastatin (Crestor)  Tadalafil (Cialis)  Testosterone  Vitamin B12  Zolpidem (Ambien) ==================================================================== For clinical consultation, please call [phone]. ====================================================================     No results found for: CBDTHCR No results found for: D8THCCBX No results found for: D9THCCBX  ROS  Constitutional: Denies any fever or  chills Gastrointestinal: No reported hemesis, hematochezia, vomiting, or acute GI distress Musculoskeletal: Denies any acute onset joint swelling, redness, loss of ROM, or weakness Neurological: No reported episodes of acute onset apraxia, aphasia, dysarthria, agnosia, amnesia, paralysis, loss of coordination, or loss of consciousness  Medication Review  Buprenorphine HCl, Multiple Vitamin, NEEDLE (DISP) 25 G, SYRINGE-NEEDLE (DISP) 3 ML, Sulfacetamide Sod-Sulfur Wash, Suzetrigine, amLODipine, aspirin EC, cyanocobalamin, hydrocortisone, ketoconazole, rosuvastatin, tadalafil, testosterone cypionate, traMADol, and zolpidem  History Review  Allergy: Shawn Crawford is allergic to penicillins. Drug: Shawn Crawford  reports no history of drug use. Alcohol:  reports current alcohol use. Tobacco:  reports that he has never smoked. He has never been exposed to tobacco smoke. He has never used smokeless tobacco. Social: Shawn Crawford  reports that he has never smoked. He has never been exposed to tobacco smoke. He has never used smokeless tobacco. He reports current alcohol use. He reports that he does not use drugs. Medical:  has a past medical history of Actinic keratosis, Coronary artery disease, Dysplastic nevus (09/06/2014), Dysplastic nevus (07/09/2015), cardiac catheterization, Hyperlipidemia, and Hypertension. Surgical: Shawn Crawford  has a past surgical history that includes Coronary artery bypass graft; Hernia repair; Appendectomy; Cardiac catheterization; Colonoscopy with propofol (N/A, 06/24/2018); Esophagogastroduodenoscopy (egd) with propofol (N/A, 06/24/2018); Exam under anesthesia with hemorrhoidectomy (N/A, 11/11/2018); Insertion of mesh (02/05/2022); and Finger surgery (Left). Family: family history includes Colon cancer in his sister; Diabetes in his father; Healthy in his son and son; Hyperlipidemia in his mother; Hypertension in his father; Thyroid cancer in his sister.  Laboratory Chemistry Profile    Renal Lab Results  Component Value Date   BUN 13 06/23/2022   CREATININE 1.25 06/23/2022   BCR 10 06/23/2022   GFRAA 96 04/18/2020   GFRNONAA >60 01/29/2022    Hepatic Lab Results  Component Value Date   AST 23 01/28/2022   ALT 19 01/28/2022   ALBUMIN 4.4 06/23/2022   ALKPHOS 84 01/28/2022   LIPASE 23 06/14/2021    Electrolytes Lab Results  Component Value Date   NA 138 06/23/2022   K 4.8 06/23/2022   CL 97 06/23/2022   CALCIUM 9.2 06/23/2022   MG 2.0 01/28/2022   PHOS 2.7 (L) 06/23/2022    Bone Lab Results  Component Value Date   TESTOFREE 5.1 (L) 11/09/2019   TESTOSTERONE 263 (L) 08/16/2021    Inflammation (CRP: Acute Phase) (ESR:  Chronic Phase) Lab Results  Component Value Date   CRP 6.0 08/19/2022   ESRSEDRATE 6 08/19/2022         Note: Above Lab results reviewed.  Recent Imaging Review  MR THORACIC SPINE WO CONTRAST CLINICAL DATA:  Chronic back pain, hyperreflexia  EXAM: MRI THORACIC SPINE WITHOUT CONTRAST  TECHNIQUE: Multiplanar, multisequence MR imaging of the thoracic spine was performed. No intravenous contrast was administered.  COMPARISON:  MRI of the thoracic spine dated 05/12/2022  FINDINGS: Alignment:  Physiologic.  Vertebrae: No fracture, evidence of discitis, or bone lesion.  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  Paraspinal and other soft tissues: Negative.  Disc levels:  No high-grade canal or foraminal stenosis.  IMPRESSION: 1. 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 2. No high-grade canal or foraminal stenosis.  Electronically Signed   By: Clem Savory M.D.   On: 02/03/2024 09:56 Note: Reviewed        Physical Exam  Vitals: BP 135/89   Pulse 89   Temp (!) 97 F (36.1 C)   Resp 20   Ht 5' 11 (1.803 m)   Wt 162 lb (73.5 kg)   SpO2 100%    BMI 22.59 kg/m  BMI: Estimated body mass index is 22.59 kg/m as calculated from the following:   Height as of this encounter: 5' 11 (1.803 m).   Weight as of this encounter: 162 lb (73.5 kg). Ideal: Ideal body weight: 75.3 kg (166 lb 0.1 oz) General appearance: Well nourished, well developed, and well hydrated. In no apparent acute distress Mental status: Alert, oriented x 3 (person, place, & time)       Respiratory: No evidence of acute respiratory distress Eyes: PERLA   Assessment   Diagnosis Status  1. Chronic pain syndrome   2. Chronic radicular lumbar pain   3. Lumbar radiculopathy    Controlled Controlled Controlled   Updated Problems: No problems updated.  Plan of Care  1. Chronic pain syndrome (Primary) - Suzetrigine (JOURNAVX) 50 MG TABS; Take 50 mg by mouth every 12 (twelve) hours for 15 days.  Dispense: 30 tablet; Refill: 1 - Buprenorphine HCl (BELBUCA) 750 MCG FILM; Place 1 Film inside cheek every 12 (twelve) hours.  Dispense: 60 each; Refill: 2 - traMADol (ULTRAM) 50 MG tablet; Take 1 tablet (50 mg total) by mouth every 8 (eight) hours as needed for severe pain (pain score 7-10).  Dispense: 90 tablet; Refill: 2  2. Chronic radicular lumbar pain - Suzetrigine (JOURNAVX) 50 MG TABS; Take 50 mg by mouth every 12 (twelve) hours for 15 days.  Dispense: 30 tablet; Refill: 1 - Buprenorphine HCl (BELBUCA) 750 MCG FILM; Place 1 Film inside cheek every 12 (twelve) hours.  Dispense: 60 each; Refill: 2 - traMADol (ULTRAM) 50 MG tablet; Take 1 tablet (50 mg total) by mouth every 8 (eight) hours as needed for severe pain (pain score 7-10).  Dispense: 90 tablet; Refill: 2  3. Lumbar radiculopathy - Suzetrigine (JOURNAVX) 50 MG TABS; Take 50 mg by mouth every 12 (twelve) hours for 15 days.  Dispense: 30 tablet; Refill: 1 - Buprenorphine HCl (BELBUCA) 750 MCG FILM; Place 1 Film inside cheek every 12 (twelve) hours.  Dispense: 60 each; Refill: 2 - traMADol (ULTRAM) 50 MG tablet;  Take 1 tablet (50 mg total) by mouth every 8 (eight)  hours as needed for severe pain (pain score 7-10).  Dispense: 90 tablet; Refill: 2   Shawn Crawford has a current medication list which includes the following long-term medication(s): amlodipine, rosuvastatin, tadalafil, testosterone cypionate, and zolpidem.  Pharmacotherapy (Medications Ordered): Meds ordered this encounter  Medications   Suzetrigine (JOURNAVX) 50 MG TABS    Sig: Take 50 mg by mouth every 12 (twelve) hours for 15 days.    Dispense:  30 tablet    Refill:  1   Buprenorphine HCl (BELBUCA) 750 MCG FILM    Sig: Place 1 Film inside cheek every 12 (twelve) hours.    Dispense:  60 each    Refill:  2   traMADol (ULTRAM) 50 MG tablet    Sig: Take 1 tablet (50 mg total) by mouth every 8 (eight) hours as needed for severe pain (pain score 7-10).    Dispense:  90 tablet    Refill:  2    Fill one day early if pharmacy is closed on scheduled refill date.   Orders:  No orders of the defined types were placed in this encounter.   Return in about 3 months (around 08/12/2024) for MM, F2F.    Recent Visits No visits were found meeting these conditions. Showing recent visits within past 90 days and meeting all other requirements Today's Visits Date Type Provider Dept  05/12/24 Office Visit Marcelino Nurse, MD [facility]  Showing today's visits and meeting all other requirements Future Appointments No visits were found meeting these conditions. Showing future appointments within next 90 days and meeting all other requirements  I discussed the assessment and treatment plan with the patient. The patient was provided an opportunity to ask questions and all were answered. The patient agreed with the plan and demonstrated an understanding of the instructions.  Patient advised to call back or seek an in-person evaluation if the symptoms or condition worsens.  Duration of encounter: .  Total time on encounter,  as per AMA guidelines included both the face-to-face and non-face-to-face time personally spent by the physician and/or other qualified health care professional(s) on the day of the encounter (includes time in activities that require the physician or other qualified health care professional and does not include time in activities normally performed by clinical staff). Physician's time may include the following activities when performed: Preparing to see the patient (e.g., pre-charting review of records, searching for previously ordered imaging, lab work, and nerve conduction tests) Review of prior analgesic pharmacotherapies. Reviewing PMP Interpreting ordered tests (e.g., lab work, imaging, nerve conduction tests) Performing post-procedure evaluations, including interpretation of diagnostic procedures Obtaining and/or reviewing separately obtained history Performing a medically appropriate examination and/or evaluation Counseling and educating the patient/family/caregiver Ordering medications, tests, or procedures Referring and communicating with other health care professionals (when not separately reported) Documenting clinical information in the electronic or other health record Independently interpreting results (not separately reported) and communicating results to the patient/ family/caregiver Care coordination (not separately reported)  Note by: Nurse Marcelino, MD (TTS and AI technology used. I apologize for any typographical errors that were not detected and corrected.) Date: 05/12/2024; Time: 10:07 AM

## 2024-05-23 ENCOUNTER — Other Ambulatory Visit: Payer: Self-pay | Admitting: Student in an Organized Health Care Education/Training Program

## 2024-05-23 ENCOUNTER — Encounter: Payer: Self-pay | Admitting: Student in an Organized Health Care Education/Training Program

## 2024-05-23 DIAGNOSIS — G8929 Other chronic pain: Secondary | ICD-10-CM

## 2024-05-23 DIAGNOSIS — M5416 Radiculopathy, lumbar region: Secondary | ICD-10-CM

## 2024-05-23 DIAGNOSIS — G894 Chronic pain syndrome: Secondary | ICD-10-CM

## 2024-08-09 ENCOUNTER — Encounter: Payer: Self-pay | Admitting: Student in an Organized Health Care Education/Training Program

## 2024-08-09 ENCOUNTER — Ambulatory Visit
Attending: Student in an Organized Health Care Education/Training Program | Admitting: Student in an Organized Health Care Education/Training Program

## 2024-08-09 DIAGNOSIS — G8929 Other chronic pain: Secondary | ICD-10-CM | POA: Diagnosis present

## 2024-08-09 DIAGNOSIS — G894 Chronic pain syndrome: Secondary | ICD-10-CM | POA: Diagnosis present

## 2024-08-09 DIAGNOSIS — M5416 Radiculopathy, lumbar region: Secondary | ICD-10-CM | POA: Insufficient documentation

## 2024-08-09 MED ORDER — TRAMADOL HCL 50 MG PO TABS: 50.0000 mg | ORAL_TABLET | Freq: Three times a day (TID) | ORAL | 2 refills | Status: AC | PRN

## 2024-08-09 MED ORDER — BELBUCA 750 MCG BU FILM: 1.0000 | ORAL_FILM | Freq: Two times a day (BID) | BUCCAL | 2 refills | Status: AC

## 2024-08-09 NOTE — Progress Notes (Signed)
 PROVIDER NOTE: Interpretation of information contained herein should be left to medically-trained personnel. Specific patient instructions are provided elsewhere under Patient Instructions section of medical record. This document was created in part using AI and STT-dictation technology, any transcriptional errors that may result from this process are unintentional.  Patient: Shawn Crawford  Service: E/M   PCP: Bertrum Charlie CROME, MD  DOB: 27-Mar-1961  DOS: 08/09/2024  Provider: Wallie Sherry, MD  MRN: [id number]  Delivery: Face-to-face  Specialty: Interventional Pain Management  Type: Established Patient  Setting: Ambulatory outpatient facility  Specialty designation: 09  Referring Prov.: Bertrum Charlie CROME, MD  Location: Outpatient office facility       History of present illness (HPI) [name], a 63 y.o. year old male, is here today because of his No primary diagnosis found.. [name] primary complain today is Back Pain (lower)  Pertinent problems: [name] has Arteriosclerosis of coronary artery; Chronic radicular lumbar pain; Benign essential HTN; Chronic pain syndrome; Chronic midline low back pain without sciatica; Lumbar facet arthropathy; Lumbar degenerative disc disease; and Piriformis muscle pain on their pertinent problem list.  Pain Assessment: Severity of Chronic pain is reported as a 3 /10. Location: Back Lower/both buttocks and back of legs. Onset: More than a month ago. Quality: Aching. Timing: Constant. Modifying factor(s): medications, lying of sice with pillow between legs. Vitals:  height is 5' 11 (1.803 m) and weight is 162 lb (73.5 kg). His temporal temperature is 97.3 F (36.3 C) (abnormal). His blood pressure is 111/81 and his pulse is 89. His respiration is 16 and oxygen saturation is 98%.  BMI: Estimated body mass index is 22.59 kg/m as calculated from the following:   Height as of this encounter: 5' 11 (1.803 m).   Weight as of this encounter: 162 lb (73.5  kg).  Last encounter: 02/09/2024. Last procedure: 09/21/2023.  Reason for encounter:   No change in medical history since last visit.  Patient's pain is at baseline.  Patient continues multimodal pain regimen as prescribed.  States that it provides pain relief and improvement in functional status.   Pharmacotherapy Assessment   Belbuca  750 mcg BID, Tramadol  50 mg TID prn   Monitoring: [place] PMP: PDMP reviewed during this encounter.       Pharmacotherapy: No side-effects or adverse reactions reported. Compliance: No problems identified. Effectiveness: Clinically acceptable.  Shela Reda CROME, RN  08/09/2024  9:38 AM  Sign when Signing Visit Nursing Pain Medication Assessment:  Safety precautions to be maintained throughout the outpatient stay will include: orient to surroundings, keep bed in low position, maintain call bell within reach at all times, provide assistance with transfer out of bed and ambulation.  Medication Inspection Compliance: Pill count conducted under aseptic conditions, in front of the patient. Neither the pills nor the bottle was removed from the patient's sight at any time. Once count was completed pills were immediately returned to the patient in their original bottle.  Medication #1: Belbuca  Pill/Patch Count: 43 of 60 pills/patches remain Pill/Patch Appearance: Markings consistent with prescribed medication Bottle Appearance: Standard pharmacy container. Clearly labeled. Filled Date: 68 / 02 / 2025 Last Medication intake:  Today  Medication #2: Tramadol  (Ultram ) Pill/Patch Count: 63 of 90 pills/patches remain Pill/Patch Appearance: Markings consistent with prescribed medication Bottle Appearance: Standard pharmacy container. Clearly labeled. Filled Date: 76 / 29 / 2025 Last Medication intake:  YesterdaySafety precautions to be maintained throughout the outpatient stay will include: orient to surroundings, keep bed in low position,  maintain call bell within reach  at all times, provide assistance with transfer out of bed and ambulation.   UDS:  Summary  Date Value Ref Range Status  11/17/2023 FINAL  Final    Comment:    ==================================================================== [employer] [facility] 13 (MW) ==================================================================== Test                             Result       Flag       Units  Drug Present and Declared for Prescription Verification   Buprenorphine                   70           EXPECTED   ng/mg creat   Norbuprenorphine               90           EXPECTED   ng/mg creat    Source of buprenorphine  is a scheduled prescription medication.    Norbuprenorphine is an expected metabolite of buprenorphine .    Tramadol                        3127         EXPECTED   ng/mg creat   O-Desmethyltramadol            >4032        EXPECTED   ng/mg creat   N-Desmethyltramadol            160          EXPECTED   ng/mg creat    Source of tramadol  is a prescription medication. O-desmethyltramadol    and N-desmethyltramadol are expected metabolites of tramadol .  ==================================================================== Test                      Result    Flag   Units      Ref Range   Creatinine              124              mg/dL      >=79 ==================================================================== Declared Medications:  The flagging and interpretation on this report are based on the  following declared medications.  Unexpected results may arise from  inaccuracies in the declared medications.   **Note: The testing scope of this panel includes these medications:   Tramadol  (Ultram )   **Note: The testing scope of this panel does not include small to  moderate amounts of these reported medications:   Buprenorphine  (Belbuca )   **Note: The testing scope of this panel does not include the  following reported medications:   Amlodipine  (Norvasc )  Aspirin   Eszopiclone  (Lunesta )   Multivitamin  Rosuvastatin  (Crestor )  Tadalafil  (Cialis )  Testosterone   Vitamin B12  Zolpidem  (Ambien ) ==================================================================== For clinical consultation, please call [phone]. ====================================================================     No results found for: CBDTHCR No results found for: D8THCCBX No results found for: D9THCCBX  ROS  Constitutional: Denies any fever or chills Gastrointestinal: No reported hemesis, hematochezia, vomiting, or acute GI distress Musculoskeletal: Denies any acute onset joint swelling, redness, loss of ROM, or weakness Neurological: No reported episodes of acute onset apraxia, aphasia, dysarthria, agnosia, amnesia, paralysis, loss of coordination, or loss of consciousness  Medication Review  Buprenorphine  HCl, Multiple Vitamin, NEEDLE (DISP) 25 G, SYRINGE-NEEDLE (DISP) 3 ML, Sulfacetamide  Sod-Sulfur  Wash, amLODipine , aspirin  EC, cyanocobalamin,  hydrocortisone , ketoconazole , rosuvastatin , tadalafil , testosterone  cypionate, traMADol , and zolpidem   History Review  Allergy: Mr. Silversmith is allergic to penicillins. Drug: Mr. Everage  reports no history of drug use. Alcohol:  reports current alcohol use. Tobacco:  reports that he has never smoked. He has never been exposed to tobacco smoke. He has never used smokeless tobacco. Social: Mr. Menzer  reports that he has never smoked. He has never been exposed to tobacco smoke. He has never used smokeless tobacco. He reports current alcohol use. He reports that he does not use drugs. Medical:  has a past medical history of Actinic keratosis, Coronary artery disease, Dysplastic nevus (09/06/2014), Dysplastic nevus (07/09/2015), cardiac catheterization, Hyperlipidemia, and Hypertension. Surgical: Mr. Volkman  has a past surgical history that includes Coronary artery bypass graft; Hernia repair; Appendectomy; Cardiac catheterization; Colonoscopy with propofol  (N/A,  06/24/2018); Esophagogastroduodenoscopy (egd) with propofol  (N/A, 06/24/2018); Exam under anesthesia with hemorrhoidectomy (N/A, 11/11/2018); Insertion of mesh (02/05/2022); and Finger surgery (Left). Family: family history includes Colon cancer in his sister; Diabetes in his father; Healthy in his son and son; Hyperlipidemia in his mother; Hypertension in his father; Thyroid cancer in his sister.  Laboratory Chemistry Profile   Renal Lab Results  Component Value Date   BUN 13 06/23/2022   CREATININE 1.25 06/23/2022   BCR 10 06/23/2022   GFRAA 96 04/18/2020   GFRNONAA >60 01/29/2022    Hepatic Lab Results  Component Value Date   AST 23 01/28/2022   ALT 19 01/28/2022   ALBUMIN 4.4 06/23/2022   ALKPHOS 84 01/28/2022   LIPASE 23 06/14/2021    Electrolytes Lab Results  Component Value Date   NA 138 06/23/2022   K 4.8 06/23/2022   CL 97 06/23/2022   CALCIUM  9.2 06/23/2022   MG 2.0 01/28/2022   PHOS 2.7 (L) 06/23/2022    Bone Lab Results  Component Value Date   TESTOFREE 5.1 (L) 11/09/2019   TESTOSTERONE  263 (L) 08/16/2021    Inflammation (CRP: Acute Phase) (ESR: Chronic Phase) Lab Results  Component Value Date   CRP 6.0 08/19/2022   ESRSEDRATE 6 08/19/2022         Note: Above Lab results reviewed.  Recent Imaging Review  MR THORACIC SPINE WO CONTRAST CLINICAL DATA:  Chronic back pain, hyperreflexia  EXAM: MRI THORACIC SPINE WITHOUT CONTRAST  TECHNIQUE: Multiplanar, multisequence MR imaging of the thoracic spine was performed. No intravenous contrast was administered.  COMPARISON:  MRI of the thoracic spine dated 05/12/2022  FINDINGS: Alignment:  Physiologic.  Vertebrae: No fracture, evidence of discitis, or bone lesion.  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  Paraspinal and other soft tissues: Negative.  Disc levels:  No high-grade canal or foraminal stenosis.  IMPRESSION: 1. 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 2. No high-grade canal or foraminal stenosis.  Electronically Signed   By: Clem Savory M.D.   On: 02/03/2024 09:56 Note: Reviewed        Physical Exam  Vitals: BP 111/81 (Cuff Size: Normal)   Pulse 89   Temp (!) 97.3 F (36.3 C) (Temporal)   Resp 16   Ht 5' 11 (1.803 m)   Wt 162 lb (73.5 kg)   SpO2 98%   BMI 22.59 kg/m  BMI: Estimated body mass index is 22.59 kg/m as calculated from the following:  Height as of this encounter: 5' 11 (1.803 m).   Weight as of this encounter: 162 lb (73.5 kg). Ideal: Ideal body weight: 75.3 kg (166 lb 0.1 oz) General appearance: Well nourished, well developed, and well hydrated. In no apparent acute distress Mental status: Alert, oriented x 3 (person, place, & time)       Respiratory: No evidence of acute respiratory distress Eyes: PERLA   Assessment   Diagnosis Status  1. Chronic pain syndrome   2. Chronic radicular lumbar pain   3. Lumbar radiculopathy    Controlled Controlled Controlled   Updated Problems: No problems updated.  Plan of Care  1. Chronic pain syndrome - Buprenorphine  HCl (BELBUCA ) 750 MCG FILM; Place 1 Film inside cheek every 12 (twelve) hours.  Dispense: 60 each; Refill: 2 - traMADol  (ULTRAM ) 50 MG tablet; Take 1 tablet (50 mg total) by mouth every 8 (eight) hours as needed for severe pain (pain score 7-10).  Dispense: 90 tablet; Refill: 2  2. Chronic radicular lumbar pain - Buprenorphine  HCl (BELBUCA ) 750 MCG FILM; Place 1 Film inside cheek every 12 (twelve) hours.  Dispense: 60 each; Refill: 2 - traMADol  (ULTRAM ) 50 MG tablet; Take 1 tablet (50 mg total) by mouth every 8 (eight) hours as needed for severe pain (pain score 7-10).  Dispense: 90 tablet; Refill: 2  3. Lumbar radiculopathy - Buprenorphine  HCl (BELBUCA ) 750 MCG FILM; Place 1 Film inside cheek every 12  (twelve) hours.  Dispense: 60 each; Refill: 2 - traMADol  (ULTRAM ) 50 MG tablet; Take 1 tablet (50 mg total) by mouth every 8 (eight) hours as needed for severe pain (pain score 7-10).  Dispense: 90 tablet; Refill: 2   Mr. Brownie K Decelles has a current medication list which includes the following long-term medication(s): amlodipine , rosuvastatin , tadalafil , testosterone  cypionate, and zolpidem .  Pharmacotherapy (Medications Ordered): Meds ordered this encounter  Medications   Buprenorphine  HCl (BELBUCA ) 750 MCG FILM    Sig: Place 1 Film inside cheek every 12 (twelve) hours.    Dispense:  60 each    Refill:  2   traMADol  (ULTRAM ) 50 MG tablet    Sig: Take 1 tablet (50 mg total) by mouth every 8 (eight) hours as needed for severe pain (pain score 7-10).    Dispense:  90 tablet    Refill:  2    Fill one day early if pharmacy is closed on scheduled refill date.   Orders:  No orders of the defined types were placed in this encounter.   Return in about 3 months (around 11/09/2024) for PPE, F2F.    Recent Visits Date Type Provider Dept  05/12/24 Office Visit Marcelino Nurse, MD [facility]  Showing recent visits within past 90 days and meeting all other requirements Today's Visits Date Type Provider Dept  08/09/24 Office Visit Marcelino Nurse, MD [facility]  Showing today's visits and meeting all other requirements Future Appointments No visits were found meeting these conditions. Showing future appointments within next 90 days and meeting all other requirements  I discussed the assessment and treatment plan with the patient. The patient was provided an opportunity to ask questions and all were answered. The patient agreed with the plan and demonstrated an understanding of the instructions.  Patient advised to call back or seek an in-person evaluation if the symptoms or condition worsens.  I personally spent a total of 30 minutes in the care of the patient today  including preparing to see the patient, getting/reviewing separately obtained  history, performing a medically appropriate exam/evaluation, counseling and educating, placing orders, and documenting clinical information in the EHR.   Note by: Wallie Sherry, MD (TTS and AI technology used. I apologize for any typographical errors that were not detected and corrected.) Date: 08/09/2024; Time: 9:55 AM

## 2024-08-09 NOTE — Progress Notes (Signed)
 Nursing Pain Medication Assessment:  Safety precautions to be maintained throughout the outpatient stay will include: orient to surroundings, keep bed in low position, maintain call bell within reach at all times, provide assistance with transfer out of bed and ambulation.  Medication Inspection Compliance: Pill count conducted under aseptic conditions, in front of the patient. Neither the pills nor the bottle was removed from the patient's sight at any time. Once count was completed pills were immediately returned to the patient in their original bottle.  Medication #1: Belbuca  Pill/Patch Count: 43 of 60 pills/patches remain Pill/Patch Appearance: Markings consistent with prescribed medication Bottle Appearance: Standard pharmacy container. Clearly labeled. Filled Date: 35 / 02 / 2025 Last Medication intake:  Today  Medication #2: Tramadol  (Ultram ) Pill/Patch Count: 63 of 90 pills/patches remain Pill/Patch Appearance: Markings consistent with prescribed medication Bottle Appearance: Standard pharmacy container. Clearly labeled. Filled Date: 1 / 29 / 2025 Last Medication intake:  YesterdaySafety precautions to be maintained throughout the outpatient stay will include: orient to surroundings, keep bed in low position, maintain call bell within reach at all times, provide assistance with transfer out of bed and ambulation.

## 2024-10-03 ENCOUNTER — Encounter

## 2024-11-08 ENCOUNTER — Ambulatory Visit: Admitting: Student in an Organized Health Care Education/Training Program

## 2024-12-15 ENCOUNTER — Ambulatory Visit: Admitting: Dermatology
